# Patient Record
Sex: Female | Born: 1940
Health system: Southern US, Community
[De-identification: ages and names within clinical notes are randomized; demographics above are authoritative.]

## PROBLEM LIST (undated history)

## (undated) DIAGNOSIS — F319 Bipolar disorder, unspecified: Secondary | ICD-10-CM

## (undated) DIAGNOSIS — J189 Pneumonia, unspecified organism: Secondary | ICD-10-CM

## (undated) DIAGNOSIS — I2510 Atherosclerotic heart disease of native coronary artery without angina pectoris: Secondary | ICD-10-CM

## (undated) DIAGNOSIS — F32A Depression, unspecified: Secondary | ICD-10-CM

## (undated) DIAGNOSIS — F419 Anxiety disorder, unspecified: Secondary | ICD-10-CM

## (undated) DIAGNOSIS — F329 Major depressive disorder, single episode, unspecified: Secondary | ICD-10-CM

## (undated) DIAGNOSIS — M1990 Unspecified osteoarthritis, unspecified site: Secondary | ICD-10-CM

## (undated) DIAGNOSIS — K219 Gastro-esophageal reflux disease without esophagitis: Secondary | ICD-10-CM

## (undated) DIAGNOSIS — I10 Essential (primary) hypertension: Secondary | ICD-10-CM

## (undated) DIAGNOSIS — G459 Transient cerebral ischemic attack, unspecified: Secondary | ICD-10-CM

## (undated) DIAGNOSIS — E119 Type 2 diabetes mellitus without complications: Secondary | ICD-10-CM

## (undated) DIAGNOSIS — J449 Chronic obstructive pulmonary disease, unspecified: Secondary | ICD-10-CM

## (undated) DIAGNOSIS — R911 Solitary pulmonary nodule: Secondary | ICD-10-CM

## (undated) HISTORY — DX: Essential (primary) hypertension: I10

## (undated) HISTORY — PX: DILATION AND CURETTAGE OF UTERUS: SHX78

## (undated) HISTORY — DX: Solitary pulmonary nodule: R91.1

## (undated) HISTORY — PX: APPENDECTOMY: SHX54

## (undated) HISTORY — DX: Bipolar disorder, unspecified: F31.9

## (undated) HISTORY — PX: CHOLECYSTECTOMY: SHX55

## (undated) HISTORY — PX: VESICOVAGINAL FISTULA CLOSURE W/ TAH: SUR271

## (undated) HISTORY — DX: Type 2 diabetes mellitus without complications: E11.9

## (undated) HISTORY — DX: Chronic obstructive pulmonary disease, unspecified: J44.9

## (undated) HISTORY — PX: KNEE ARTHROSCOPY: SUR90

## (undated) HISTORY — PX: TIBIA FRACTURE SURGERY: SHX806

## (undated) HISTORY — PX: CATARACT EXTRACTION, BILATERAL: SHX1313

## (undated) HISTORY — DX: Atherosclerotic heart disease of native coronary artery without angina pectoris: I25.10

## (undated) HISTORY — DX: Transient cerebral ischemic attack, unspecified: G45.9

## (undated) HISTORY — PX: ABDOMINAL HYSTERECTOMY: SHX81

## (undated) SURGERY — Surgical Case
Anesthesia: *Unknown

---

## 1998-07-26 HISTORY — PX: TOTAL KNEE ARTHROPLASTY: SHX125

## 1999-03-23 ENCOUNTER — Encounter: Payer: Self-pay | Admitting: Orthopedic Surgery

## 1999-03-25 ENCOUNTER — Inpatient Hospital Stay (HOSPITAL_COMMUNITY): Admission: RE | Admit: 1999-03-25 | Discharge: 1999-03-31 | Payer: Self-pay | Admitting: Orthopedic Surgery

## 1999-03-28 ENCOUNTER — Encounter: Payer: Self-pay | Admitting: Cardiovascular Disease

## 1999-06-09 ENCOUNTER — Ambulatory Visit (HOSPITAL_COMMUNITY): Admission: RE | Admit: 1999-06-09 | Discharge: 1999-06-09 | Payer: Self-pay | Admitting: Orthopedic Surgery

## 2008-01-24 DIAGNOSIS — R911 Solitary pulmonary nodule: Secondary | ICD-10-CM

## 2008-01-24 HISTORY — DX: Solitary pulmonary nodule: R91.1

## 2008-01-28 ENCOUNTER — Encounter: Payer: Self-pay | Admitting: Cardiology

## 2008-01-29 ENCOUNTER — Ambulatory Visit: Payer: Self-pay | Admitting: Cardiology

## 2008-01-30 ENCOUNTER — Encounter: Payer: Self-pay | Admitting: Cardiology

## 2008-02-15 ENCOUNTER — Ambulatory Visit: Payer: Self-pay | Admitting: Cardiology

## 2008-08-06 ENCOUNTER — Encounter: Payer: Self-pay | Admitting: Cardiology

## 2008-08-19 ENCOUNTER — Encounter: Payer: Self-pay | Admitting: Cardiology

## 2008-09-04 ENCOUNTER — Ambulatory Visit: Payer: Self-pay | Admitting: Cardiology

## 2008-09-04 ENCOUNTER — Encounter: Payer: Self-pay | Admitting: Cardiology

## 2008-09-05 ENCOUNTER — Ambulatory Visit: Payer: Self-pay | Admitting: Cardiovascular Disease

## 2008-09-05 ENCOUNTER — Encounter: Payer: Self-pay | Admitting: Cardiology

## 2008-09-05 ENCOUNTER — Inpatient Hospital Stay (HOSPITAL_COMMUNITY): Admission: AD | Admit: 2008-09-05 | Discharge: 2008-09-06 | Payer: Self-pay | Admitting: Cardiovascular Disease

## 2008-09-06 ENCOUNTER — Ambulatory Visit: Payer: Self-pay | Admitting: Surgery

## 2008-09-06 ENCOUNTER — Encounter: Payer: Self-pay | Admitting: Cardiovascular Disease

## 2008-09-17 ENCOUNTER — Encounter: Payer: Self-pay | Admitting: Cardiology

## 2008-09-26 ENCOUNTER — Ambulatory Visit: Payer: Self-pay | Admitting: Cardiology

## 2008-12-09 ENCOUNTER — Encounter: Payer: Self-pay | Admitting: Cardiology

## 2008-12-11 ENCOUNTER — Encounter: Payer: Self-pay | Admitting: Cardiology

## 2008-12-24 ENCOUNTER — Encounter: Payer: Self-pay | Admitting: Cardiology

## 2009-02-21 ENCOUNTER — Encounter: Payer: Self-pay | Admitting: Cardiology

## 2009-02-26 ENCOUNTER — Encounter: Payer: Self-pay | Admitting: Cardiology

## 2009-03-25 ENCOUNTER — Telehealth: Payer: Self-pay | Admitting: Cardiology

## 2009-04-23 ENCOUNTER — Encounter: Payer: Self-pay | Admitting: Cardiology

## 2009-05-23 ENCOUNTER — Ambulatory Visit: Payer: Self-pay | Admitting: Cardiology

## 2009-05-23 DIAGNOSIS — I10 Essential (primary) hypertension: Secondary | ICD-10-CM

## 2009-05-23 DIAGNOSIS — F17200 Nicotine dependence, unspecified, uncomplicated: Secondary | ICD-10-CM

## 2009-05-23 DIAGNOSIS — I2510 Atherosclerotic heart disease of native coronary artery without angina pectoris: Secondary | ICD-10-CM | POA: Insufficient documentation

## 2009-05-23 DIAGNOSIS — E782 Mixed hyperlipidemia: Secondary | ICD-10-CM

## 2009-07-07 ENCOUNTER — Encounter: Payer: Self-pay | Admitting: Cardiology

## 2009-07-22 ENCOUNTER — Encounter (INDEPENDENT_AMBULATORY_CARE_PROVIDER_SITE_OTHER): Payer: Self-pay | Admitting: *Deleted

## 2009-08-27 ENCOUNTER — Encounter: Payer: Self-pay | Admitting: Cardiology

## 2009-09-23 ENCOUNTER — Encounter: Payer: Self-pay | Admitting: Cardiology

## 2009-09-25 ENCOUNTER — Encounter: Payer: Self-pay | Admitting: Cardiology

## 2009-12-09 ENCOUNTER — Encounter (INDEPENDENT_AMBULATORY_CARE_PROVIDER_SITE_OTHER): Payer: Self-pay | Admitting: *Deleted

## 2010-02-03 ENCOUNTER — Encounter: Payer: Self-pay | Admitting: Cardiology

## 2010-03-05 ENCOUNTER — Ambulatory Visit: Payer: Self-pay | Admitting: Cardiology

## 2010-03-16 ENCOUNTER — Encounter: Payer: Self-pay | Admitting: Cardiology

## 2010-06-26 ENCOUNTER — Encounter (INDEPENDENT_AMBULATORY_CARE_PROVIDER_SITE_OTHER): Payer: Self-pay | Admitting: *Deleted

## 2010-06-29 ENCOUNTER — Encounter: Payer: Self-pay | Admitting: Cardiology

## 2010-08-25 NOTE — Letter (Signed)
Summary: [facility]  Letter/ [facility]   Imported By: Dorise Hiss 09/24/2009 09:22:16  _____________________________________________________________________  External Attachment:    Type:   Image     Comment:   External Document

## 2010-08-25 NOTE — Miscellaneous (Signed)
Summary: Orders Update  Clinical Lists Changes  Problems: Added new problem of LONG-TERM (CURRENT) USE OF OTHER MEDICATIONS (ICD-V58.69) Orders: Added new Test order of T-Hepatic Function ([id number]) - Signed Added new Test order of T-Lipid Profile ([id number]) - Signed 

## 2010-08-25 NOTE — Assessment & Plan Note (Signed)
Summary: 6 MO FU REMINDER-SRS   Visit Type:  Follow-up Primary Provider:  Dr. Doreen Beam   History of Present Illness: 70 year old woman presents for followup. She is here with an aide. Uses a wheelchair. Still has difficulty ambulating. No reported angina or unusual shortness of breath over baseline. She continues to smoke cigarettes despite chronic lung disease. We have discussed smoking cessation.  Patient was taken off of Crestor at her last visit. Followup labs from March showed cholesterol 219, HDL 48, LDL 135, cholesterol is 182, ALT 13, AST 13. Low-dose simvastatin was initiated at that time. She seems to be tolerating it. Most recent followup labs from July showed cholesterol 207, cholesterol 134, HDL 47, LDL 133, ALT 14, AST 13. We discussed the fact that we will be limited in how high this medicine can be increased related to some of her other concurrent medications. May be best to switch altogether.  The patient's aide indicates that blood pressures at home have been 150-170 systolic over the 80s. Dr. Sherril Croon added low-dose metoprolol recently.  Preventive Screening-Counseling & Management  Alcohol-Tobacco     Smoking Status: current     Smoking Cessation Counseling: yes     Packs/Day: 1/2 PPD  Current Medications (verified): 1)  Isosorbide Mononitrate Cr 30 Mg Xr24h-Tab (Isosorbide Mononitrate) .... Take 1 Tablet By Mouth Once A Day 2)  Advair Diskus 250-50 Mcg/dose Aepb (Fluticasone-Salmeterol) .... One Inhalation Twice Daily 3)  Metformin Hcl 500 Mg Tabs (Metformin Hcl) .... Take 2 Tablet By Mouth Two Times A Day 4)  Diltiazem Hcl Cr 120 Mg Xr12h-Cap (Diltiazem Hcl) .... Take 1 Tablet By Mouth Once A Day 5)  Duoneb 0.5-2.5 (3) Mg/63ml Soln (Ipratropium-Albuterol) .... One Neb Four Times A Day 6)  Lamictal 200 Mg Tabs (Lamotrigine) .... Take 1 Tablet By Mouth Once A Day 7)  Aspirin 81 Mg Tbec (Aspirin) .... Take 1 Tablet By Mouth Once A Day 8)  Spiriva Handihaler 18 Mcg Caps  (Tiotropium Bromide Monohydrate) .... One Inhalation Daily 9)  Alprazolam 0.25 Mg Tabs (Alprazolam) .... Take 1 Tablet By Mouth Three Times A Day 10)  Caltrate 600 1500 Mg Tabs (Calcium Carbonate) .... Take 1 Tablet By Mouth Once A Day 11)  Simvastatin 10 Mg Tabs (Simvastatin) .... Take One Tablet By Mouth Daily At Bedtime 12)  Seroquel 25 Mg Tabs (Quetiapine Fumarate) .... Take 1 Tablet By Mouth Once A Day 13)  Vesicare 10 Mg Tabs (Solifenacin Succinate) .... Take 1 Tablet By Mouth Once A Day 14)  Donepezil Hcl 10 Mg Tabs (Donepezil Hcl) .... Take 1 Tablet By Mouth Once A Day 15)  Sertraline Hcl 100 Mg Tabs (Sertraline Hcl) .... Take 1 Tablet By Mouth Once A Day 16)  Metoprolol Succinate 25 Mg Xr24h-Tab (Metoprolol Succinate) .... Take 1/2 Tablet By Mouth Once A Day  Allergies (verified): 1)  ! Pcn 2)  ! Tetracycline 3)  ! Zithromax  Comments:  Nurse/Medical Assistant: The patient's medication list and allergies were reviewed with the patient and were updated in the Medication and Allergy Lists.  Past History:  Past Medical History: Last updated: 05/23/2009 COPD Bipolar disorder History of TIAs Dysphagia History of nonspecific, 5-mm right middle lobe nodule, July 2009 CAD - nonobstructive Diabetes Type 2 Hypertension  Social History: Last updated: 09/05/2008 Tobacco Use - Yes.   Review of Systems       The patient complains of dyspnea on exertion.  The patient denies anorexia, fever, chest pain, syncope, peripheral edema, melena, and hematochezia.  Otherwise reviewed and negative except as outlined.  Vital Signs:  Patient profile:   70 year old female Height:      65 inches Weight:      157 pounds BMI:     26.22 Pulse rate:   60 / minute BP sitting:   178 / 78  (left arm) Cuff size:   regular  Vitals Entered By: Carlye Grippe (March 05, 2010 10:59 AM)  Nutrition Counseling: Patient's BMI is greater than 25 and therefore counseled on weight management  options.  Physical Exam  Additional Exam:  Chronically ill-appearing woman in no acute distress. HEENT: Conjunctiva and lids normal, oropharynx with poor dentition. Neck: Supple, no carotid bruits or thyromegaly. Lungs: Diminished breath sounds, no active wheezing, nonlabored. Cardiac: Distant regular heart sounds, no S3 gallop. Abdomen: Soft, nontender. Extremities: No frank pitting edema, mild venous stasis, 1+ pulses. Skin: Warm and dry. Musculoskeletal: No kyphosis. Neuropsychiatric: Alert and oriented x3, affect flat.   EKG  Procedure date:  03/05/2010  Findings:      Sinus rhythm at 61 beats per minute with left ventricular hypertrophy.  Impression & Recommendations:  Problem # 1:  ESSENTIAL HYPERTENSION, BENIGN (ICD-401.1)  Blood pressure not well controlled. Dr. Sherril Croon has been making some medication adjustments recently. One approach to consider might be to discontinue beta blocker therapy in light of her chronic lung disease, continue diltiazem, and to this add an ACE inhibitor presuming renal function is normal. This would be a good choice with associated diabetes mellitus and left ventricular hypertrophy.  The following medications were removed from the medication list:    Lasix 40 Mg Tabs (Furosemide) .Marland Kitchen... Take 1 tablet by mouth once a day as needed Her updated medication list for this problem includes:    Diltiazem Hcl Cr 120 Mg Xr12h-cap (Diltiazem hcl) .Marland Kitchen... Take 1 tablet by mouth once a day    Aspirin 81 Mg Tbec (Aspirin) .Marland Kitchen... Take 1 tablet by mouth once a day    Metoprolol Succinate 25 Mg Xr24h-tab (Metoprolol succinate) .Marland Kitchen... Take 1/2 tablet by mouth once a day  Problem # 2:  TOBACCO ABUSE (ICD-305.1)  Smoking cessation was discussed.  Problem # 3:  CORONARY ATHEROSCLEROSIS NATIVE CORONARY ARTERY (ICD-414.01)  Nonobstructive, no active angina. Risk factor modification strategies recommended.  Her updated medication list for this problem includes:     Isosorbide Mononitrate Cr 30 Mg Xr24h-tab (Isosorbide mononitrate) .Marland Kitchen... Take 1 tablet by mouth once a day    Diltiazem Hcl Cr 120 Mg Xr12h-cap (Diltiazem hcl) .Marland Kitchen... Take 1 tablet by mouth once a day    Aspirin 81 Mg Tbec (Aspirin) .Marland Kitchen... Take 1 tablet by mouth once a day    Metoprolol Succinate 25 Mg Xr24h-tab (Metoprolol succinate) .Marland Kitchen... Take 1/2 tablet by mouth once a day  Orders: EKG w/ Interpretation (93000)  Problem # 4:  HYPERLIPIDEMIA, MIXED (ICD-272.2)  At this point will plan to change from simvastatin to Lipitor beginning at 20 mg p.o. q.h.s. More likely to be able to achieve LDL control. Obtain fasting lipid profile and liver function tests after approximately 12 weeks.  Her updated medication list for this problem includes:    Lipitor 20 Mg Tabs (Atorvastatin calcium) .Marland Kitchen... Take one tablet by mouth daily.  Patient Instructions: 1)  Your physician wants you to follow-up in: 6 months. You will receive a reminder letter in the mail one-two months in advance. If you don't receive a letter, please call our office to schedule the follow-up appointment. 2)  [employer]  current supply of Zocor (simvastatin), then start Lipitor 20mg  by mouth at bedtime. 3)  Your physician recommends that you go to the [facility] for a FASTING lipid profile and liver function labs:  3 MONTHS AFTER STARTING LIPITOR. 4)  Your physician discussed the hazards of tobacco use.  Tobacco use cessation is recommended and techniques and options to help you quit were discussed. Prescriptions: LIPITOR 20 MG TABS (ATORVASTATIN CALCIUM) Take one tablet by mouth daily.  #30 x 6   Entered by:   Cyril Loosen, RN, BSN   Authorized by:   Loreli Slot, MD, [facility]   Signed by:   Cyril Loosen, RN, BSN on 03/05/2010   Method used:   Electronically to        [facility]* (retail)       [address]       [place], [place]  [address]       Ph: [phone]       Fax: [phone]    RxID:   [id number]

## 2010-08-25 NOTE — Medication Information (Signed)
Summary: Lipitor Auth Denial  Lipitor Auth Denial   Imported By: Cyril Loosen, RN, BSN 03/17/2010 08:42:26  _____________________________________________________________________  External Attachment:    Type:   Image     Comment:   External Document  Appended Document: Lipitor Auth Denial Reviewed.  The patient has already tried Crestor and had to stop it related to leg pain.  This is documented in prior notes.  Simvastatin was stopped due to being inadequetly effective at low dose (which she is limited to based on medication profile and FDA recommendations).  Vytorin has simvastatin in it, and is therefore restricted by the same FDA recommendations, and therefore not an appropriate choice either.  This brings [place] back to Lipitor, which I would still suggest is a reasonable option in this case.  Appended Document: Lipitor Auth Denial called [employer] and gave new info r/e failed meds which inclueded crestor 40mg  that patient tried and was unable to use due to leg pain. Pharmacist approved lipitor 20mg  till 07/25/2010. Called and informed pharmacy.

## 2010-08-25 NOTE — Letter (Signed)
Summary: Generic Engineer, agricultural at [facility] [address]   [place], [place] [address]   Phone: [phone]  Fax: [phone]        Dec 09, 2009 MRN: [id number]    [facility] [address] [place], [place]  [address]    Dear Ms. ZEIMET,   It is now time for you to have lab work to check your cholesterol and liver function following your medication change in March. Please, take the enclosed order to the [facility] to have this lab work done as soon as possible. Do not eat or drink after midnight.   You were also due for a 6 month follow up with Dr. Diona Browner in April. Please, contact our office as soon as possible to schedule this appointment.      Sincerely,  Cyril Loosen, RN, BSN  This letter has been electronically signed by your physician.  Appended Document: Generic Letter Pt's daughter called and scheduled appt with Dr. Diona Browner on 7/22. She will have pt do labs before this appt date. Updated order faxed to the [facility].

## 2010-11-10 LAB — LIPID PANEL
Cholesterol: 164 mg/dL (ref 0–200)
HDL: 49 mg/dL (ref >39)
LDL Cholesterol: 98 mg/dL (ref 0–99)
Total CHOL/HDL Ratio: 3.3 RATIO
Triglycerides: 86 mg/dL (ref <150)

## 2010-11-10 LAB — BASIC METABOLIC PANEL
BUN: 25 mg/dL — ABNORMAL HIGH (ref 6–23)
Calcium: 9.2 mg/dL (ref 8.4–10.5)
Chloride: 102 mEq/L (ref 96–112)
Creatinine, Ser: 0.93 mg/dL (ref 0.4–1.2)
GFR calc Af Amer: >60 mL/min (ref >60)
GFR calc non Af Amer: 60 mL/min — ABNORMAL LOW (ref >60)

## 2010-11-10 LAB — CBC
Platelets: 208 10*3/uL (ref 150–400)
RBC: 4.01 MIL/uL (ref 3.87–5.11)
WBC: 19.3 10*3/uL — ABNORMAL HIGH (ref 4.0–10.5)

## 2010-11-10 LAB — GLUCOSE, CAPILLARY
Glucose-Capillary: 183 mg/dL — ABNORMAL HIGH (ref 70–99)
Glucose-Capillary: 245 mg/dL — ABNORMAL HIGH (ref 70–99)
Glucose-Capillary: 260 mg/dL — ABNORMAL HIGH (ref 70–99)

## 2010-11-24 HISTORY — PX: CARDIAC CATHETERIZATION: SHX172

## 2010-12-08 NOTE — Assessment & Plan Note (Signed)
[facility]                          [facility] NOTE   NAME:Brenda Mcdonald, Brenda Mcdonald                       MRN:          [id number]  DATE:09/26/2008                            DOB:          1941/04/28    PRIMARY CARE PHYSICIAN:  Doreen Beam, MD   PULMONOLOGIST:  Cherie Ouch, MD   REASON FOR VISIT:  Post-hospital followup.   HISTORY OF PRESENT ILLNESS:  I saw Ms. Cleary in the office back in July  2009.  She has been managed medically for possible underlying coronary  atherosclerosis in the setting of chronic obstructive pulmonary disease  and previously documented left ventricular ejection fraction of 55-60%  with mild-to-moderate ventricular chamber dilatation.  More recently in  February she was seen by Dr. Andee Lineman in consultation at [facility] after presenting with chest pain.  She ruled out for myocardial  infarction, although in light of her risk factor profile and presumed  diagnosis of coronary artery disease was referred for a diagnostic  cardiac catheterization at [facility] for definitive diagnosis.  She underwent a coronary angiogram which revealed nonobstructive  atherosclerosis and underwent some medication adjustments for further  risk factor modification.  She did undergo carotid duplex which showed  moderate calcific plaque in the common carotid with no internal carotid  artery stenosis.  She presents now to the office with her daughter for  followup.  She has had no groin complications status post angiography.  She has apparently had some problems with lower extremity edema and  weight gain and was placed on p.r.n. Lasix and potassium  supplementation, although this is improved per her report.  She is  tolerating her medical regimen which is outlined below and is due to see  Dr. Orson Aloe as well as Dr. Sherril Croon in followup.   ALLERGIES:  PENICILLIN and TETRACYCLINE.   PRESENT MEDICATIONS:  1. Advair  250/50 one puff daily.  2. Metformin 500 mg two tablets p.o. b.i.d.  3. Diltiazem CD 120 mg p.o. daily.  4. DuoNeb q.i.d.  5. Imdur 30 mg p.o. nightly.  6. Zyprexa 5 mg p.o. nightly.  7. Xanax 0.5 mg one-half tablet p.o. q.a.m. 1 mid day, and 2 tablets      p.o. nightly.  8. Glipizide XL 5 mg p.o. q.a.m.  9. Crestor 40 mg p.o. daily.  10.Lamictal 25 mg p.o. nightly.  11.Lasix 40 mg p.o. daily p.r.n. with potassium supplementation.  12.Aspirin 81 mg p.o. daily.  13.Spiriva one capsule daily.   REVIEW OF SYSTEMS:  As outlined above, appears negative.   PHYSICAL EXAMINATION:  VITAL SIGNS:  Blood pressure is 121/73, heart  rate 90, and weight 196 pounds.  GENERAL:  The patient is in no acute distress, seated in a wheelchair.  LUNGS:  Diminished breath sounds, but no wheezing or labored breathing.  CARDIAC:  Regular rate and rhythm.  No pericardial rub, S3, or gallop.  Soft S4.  PMI indistinct.  ABDOMEN:  Soft and nontender.  Positive bowel sounds.  EXTREMITIES:  Exhibit 1+ edema and venous stasis.  Distal pulses 1+.  SKIN:  Warm and dry.  MUSCULOSKELETAL:  No kyphosis noted.   IMPRESSION AND RECOMMENDATIONS:  Nonobstructive coronary atherosclerosis  documented recently at cardiac catheterization in the setting of chest  pain and cardiac risk factors.  At this point, would recommend  aggressive risk factor modification with management of blood pressure,  lipids, and diabetes mellitus.  She is now on high-dose Crestor.  We  will plan a followup lipid profile and liver function tests down the  road.  Otherwise, her medical regimen includes aspirin, a long-acting  nitrate, and calcium channel blocker therapy rather than beta-blocker  therapy in light of her lung disease.  She is not reporting any recent  problems with chest pain and is due to see Dr. Orson Aloe back for  routine followup.  I will plan to see her back over the next 6 months.     Jonelle Sidle, MD  Electronically  Signed    SGM/MedQ  DD: 09/26/2008  DT: 09/27/2008  Job #: [id number]   cc:   Doreen Beam, MD  Cherie Ouch, MD

## 2010-12-08 NOTE — Discharge Summary (Signed)
Brenda Mcdonald, Brenda Mcdonald                ACCOUNT NO.:  [id number]   MEDICAL RECORD NO.:  [id number]          PATIENT TYPE:  INP   LOCATION:  4729                         FACILITY:  [facility]   PHYSICIAN:  Verne Carrow, MDDATE OF BIRTH:  March 12, 1941   DATE OF ADMISSION:  09/05/2008  DATE OF DISCHARGE:  09/06/2008                               DISCHARGE SUMMARY   ADDENDUM   The patient will follow up with Dr. Diona Browner on September 26, 2008, at 1:45  p.m. per conversation with Lynden Ang at the [place] office, this is just what  makes the most sense, based on Dr. Margarita Mail schedule and previous visits  that this patient has had with Dr. Diona Browner recently.      Jarrett Ables, [facility]      Verne Carrow, MD  Electronically Signed    MS/MEDQ  D:  09/06/2008  T:  09/07/2008  Job:  [id number]

## 2010-12-08 NOTE — Discharge Summary (Signed)
NAMECORRIN, SIELING                ACCOUNT NO.:  [id number]   MEDICAL RECORD NO.:  [id number]          PATIENT TYPE:  INP   LOCATION:  4729                         FACILITY:  [facility]   PHYSICIAN:  Verne Carrow, MDDATE OF BIRTH:  November 22, 1940   DATE OF ADMISSION:  09/05/2008  DATE OF DISCHARGE:  09/06/2008                               DISCHARGE SUMMARY   PRIMARY CARDIOLOGIST:  Learta Codding, MD, [facility]   PRIMARY MEDICAL DOCTOR:  Doreen Beam, MD   DISCHARGE DIAGNOSIS:  Noncardiac chest pain.   SECONDARY DIAGNOSES:  1. Nonobstructive coronary artery disease.  2. Diabetes mellitus.  3. Peripheral vascular disease.  4. Chronic obstructive pulmonary disease.  5. Bipolar disorder.  6. Hypertension.  7. Stable calcified right lower lobe granuloma.  8. History of transient ischemic attacks.   ALLERGIES:  1. PENICILLIN.  2. AZITHROMYCIN.   PROCEDURES PERFORMED DURING THIS HOSPITALIZATION:  Cardiac  catheterization which showed:  1. Mild nonobstructive coronary artery disease.  2. Normal left ventricular systolic function.  3. No significant abdominal aortic disease.  4. No significant disease in the common iliac arteries, external iliac      arteries, or bilateral femoral arteries.  5. Normal bilateral renal arteries.   HISTORY OF PRESENT ILLNESS:  Brenda Mcdonald is a 70 year old female with a  history of hypertension, type 2 diabetes, transient ischemic attacks,  and nonobstructive cerebrovascular disease by carotid Dopplers on  November 2007 as well as COPD and bipolar disorder, who presented to the  ER at [facility] on September 04, 2008, with complaints of severe  chest pain and associated shortness of breath.  She was found to have  elevated BP of 196/99 and pulse of 104.  She had an EKG at that time,  history of sinus tachycardia with no acute changes.  Serial cardiac  markers at the time of the Cardiology consult by Dr. Diona Browner were all  within normal limits.  The  patient describes the pain beginning on  September 04, 2008, in the afternoon suddenly and severely, and was  witnessed by her husband who reports the patient looking extremely pale  and anticipated her passing out based on her parents.  The patient did  not experience syncope; however, she was short of breath and had intense  chest pain described as a pressure.  The patient was treated with  aspirin and nitroglycerin as well as nebulizer treatment, steroids, and  IV Levaquin.  The patient had chest x-ray, which was consistent with  COPD, but no definitive evidence of pulmonary edema or pleural  effusions.  There was an unchanged calcified granuloma at the right lung  base.  The patient only exerts herself minimally during house chores and  walking around her home with some difficulty, however, per her and her  husband's report she seems to have dyspnea with this minimal exertion.  Per 2-D echocardiogram on July 2009, LVEF approximately 50-55% with  moderate mitral regurgitation.  After reviewing all the informations,  Dr. Diona Browner felt her symptoms compelling enough to transfer to [facility] for diagnostic left cardiac catheterization.  HOSPITAL COURSE:  The patient was admitted to [facility] on  September 05, 2008, and underwent a cardiac catheterization as described  above without significant complications.  The patient tolerated the  procedure well and was deemed in stable enough condition for discharge  on September 06, 2008.  The patient will follow up with her primary  cardiologist within 2-3 weeks to be scheduled prior to the patient's  discharge.  The patient's symptoms were significantly improved on the  day of discharge without any chest pain and minimal shortness of breath.  Her vital signs remained stable during her hospital course.  Most recent  vital signs on day of discharge were temperature 97.1 degrees  Fahrenheit, BP 121/67, pulse 90, respirations 17,  and O2 saturation 97%  on 2 L.  Her weight was 95.663 kg.  The patient was given her followup  instructions and medications list in both oral and written form, and at  the time of discharge, she had no questions or concerns that had not  been addressed.   DISCHARGE LABORATORY DATA:  WBC 19.3, HGB 11.6, HCT 34.5, and PLT count  208.  Sodium 139, potassium 3.7, chloride 102, CO2 28, BUN 25,  creatinine 0.93, glucose was 229, calcium 9.2, and total cholesterol was  164.  Triglycerides were 86, HDL 49, LDL 98, and VLDL 17.   FOLLOWUP PLANS AND APPOINTMENTS:  The patient will be scheduled to see  Dr. Andee Lineman in the next 2-3 weeks.  The patient has been instructed to  schedule a followup appointment with her primary care doctor within 1  week of today if possible or as soon as possible.   DURATION OF DISCHARGE/ENCOUNTER INCLUDING PHYSICIAN TIME:  45 minutes.      Jarrett Ables, [facility]      Verne Carrow, MD  Electronically Signed    MS/MEDQ  D:  09/06/2008  T:  09/07/2008  Job:  [id number]   cc:   Jonelle Sidle, MD  Learta Codding, MD,FACC  Doreen Beam, MD

## 2010-12-08 NOTE — Assessment & Plan Note (Signed)
[facility]                          [facility] OFFICE NOTE   NAME:Brenda Mcdonald, Brenda Mcdonald                       MRN:          [id number]  DATE:02/15/2008                            DOB:          [birth year]    PRIMARY CARE PHYSICIAN:  Doreen Beam, MD   REASON FOR VISIT:  Establish cardiac followup.   HISTORY OF PRESENT ILLNESS:  Brenda Mcdonald was seen as an inpatient consults  back in early July 2009.  She was admitted at that time with evidence of  COPD exacerbation and also diastolic heart failure.  Echocardiography  revealed mildly to moderately dilated left ventricle with an ejection  fraction of 55-60% and pseudo-normal diastolic filling pattern.  There  was moderate mitral regurgitation and also mild tricuspid regurgitation.  She was managed medically.  We did discuss the possibility of proceeding  with a noninvasive ischemic workup, although Brenda Mcdonald prefers  conservative management at this point.  She does have general chest  tightness although this may well be related to her lung status.  It is  not purely exertional.  She states that she has home health nursing 3  times a week and using a rolling walker.  She also uses regular  nebulizer treatments and metered dose inhalers.  She is due to see Dr.  Orson Aloe tomorrow.  We talked about the likelihood that she does have  some degree of underlying coronary artery disease, and she seems fairly  realistic about this.  She prefers medical therapy, and we discussed  adding a long-acting nitrate to see if this helps symptomatically.   ALLERGIES:  1. PENICILLIN.  2. TETRACYCLINE.   PRESENT MEDICATIONS:  1. Advair 250/50 one puff daily.  2. Aspirin 325 mg p.o. daily.  3. Glimepiride 4 mg p.o. daily.  4. Metformin 500 mg 2 tablets p.o. b.i.d.  5. Diltiazem CD 120 mg p.o. daily.  6. Lithium 300 mg 2 tablets p.o. q.a.m.  7. DuoNeb q.i.d.  8. Xanax 0.5 mg p.r.n.  9. Albuterol inhaler p.r.n.   REVIEW OF  SYSTEMS:  As described in history of present illness.  Otherwise negative.   PHYSICAL EXAMINATION:  VITAL SIGNS:  Blood pressure today is 152/79,  heart rate is 85, and weight is 189 pounds.  GENERAL:  He is overweight, in no acute distress.  HEENT:  Conjunctivae are normal.  Oropharynx is clear.  NECK:  Supple.  No elevated jugular venous pressure.  No audible bruits.  LUNGS:  Diminished breath sounds throughout.  No active wheezing or  labored breathing.  CARDIAC:  Regular rate and rhythm.  Soft systolic murmur towards the  apex.  No S3 gallop or pericardial rub.  ABDOMEN:  Soft, nontender, and normoactive bowel sounds.  EXTREMITIES:  Trace edema and chronic stasis.  Distal pulses 1-2+.  SKIN:  Warm and dry.  MUSCULOSKELETAL:  No kyphosis noted.  NEUROPSYCHIATRIC:  The patient is alert and oriented x3.  Affect is  appropriate.   Other recent studies include carotid Dopplers in 2007 demonstrating mild  internal carotid artery plaque.  Labs done just recently indicate  actually  very favorable lipid numbers with a total cholesterol of 133,  triglycerides 78, HDL 54, and LDL 63.   IMPRESSION AND RECOMMENDATIONS:  1. Presumed history of underlying coronary atherosclerosis.  This is      being managed conservatively with medical therapy at this time      after discussion with the patient.  She will continue on aspirin      and diltiazem CD 120 mg daily.  I will begin a trial of Imdur 30 mg      daily as well.  Lipid status actually looks quite favorable.  She      plans to continue with her home physical therapy and home health      nursing.  We will see her back over the next 3 month.  2. Chronic obstructive pulmonary disease, to see Dr. Orson Aloe      tomorrow.  I think this also clearly impacts her symptoms.     Jonelle Sidle, MD  Electronically Signed    SGM/MedQ  DD: 02/15/2008  DT: 02/16/2008  Job #: [id number]   cc:   Doreen Beam, MD

## 2010-12-08 NOTE — Cardiovascular Report (Signed)
NAMEJULLIAN, Brenda Mcdonald                ACCOUNT NO.:  [id number]   MEDICAL RECORD NO.:  [id number]          PATIENT TYPE:  INP   LOCATION:  4729                         FACILITY:  [facility]   PHYSICIAN:  Verne Carrow, MDDATE OF BIRTH:  [birth year]   DATE OF PROCEDURE:  09/05/2008  DATE OF DISCHARGE:                            CARDIAC CATHETERIZATION   PRIMARY CARDIOLOGIST:  Learta Codding, MD, [facility]   PRIMARY CARE PHYSICIAN:  Doreen Beam, MD   PROCEDURES PERFORMED:  1. Left heart catheterization  2. Selective coronary angiography.  3. Left ventricular angiogram.  4. Abdominal aortogram with bifemoral runoff.   OPERATOR:  Verne Carrow, MD   INDICATIONS:  This is a 70 year old Caucasian female with a history of  diabetes mellitus, hypertension, longstanding tobacco abuse, COPD, and a  strong family history of coronary artery disease who presented to  [facility] yesterday with complaints of substernal chest pain.  She ruled out for a myocardial infarction and had no ischemic EKG  changes.  Her story was worrisome for obstructive coronary artery  disease.  She was transferred to [facility] today for a  diagnostic left heart catheterization.   DESCRIPTION OF PROCEDURE:  The patient was brought to the Inpatient  Cardiac Catheterization Laboratory after signing informed consent for  the procedure.  The right groin was prepped and draped in sterile  fashion.  Lidocaine 1% was used for local anesthesia.  A 5-French sheath  was placed in the right femoral artery without difficulty.  Standard  diagnostic catheters were used to perform selective coronary  angiography.  A 5-French pigtail catheter was used to cross the aortic  valve into the left ventricle.  Following the performance of the left  ventricular angiogram, the catheter was pulled back across the aortic  valve with no significant pressure gradient measured.  At this point in  case, we performed an abdominal  aortogram from the suprarenal location.  We also performed another aortogram just at the level of the bifurcation  into the common iliacs.  A selective right femoral angiogram was  obtained through the arterial sheath.  The patient tolerated the  procedure well and was taken to the holding area in stable condition.   HEMODYNAMIC FINDINGS:  Left ventricular pressure 145/6, end-diastolic  pressure 9, central aortic pressure 158/87.   ANGIOGRAPHIC FINDINGS:  1. Left ventricular angiogram was performed in the RAO projection.      This showed normal left ventricular systolic function with an      ejection fraction of 55-60%.  No wall motion abnormalities were      noted.  There was no significant mitral regurgitation noted.  2. Left main coronary artery had no significant obstructive disease.  3. Left anterior descending is a large vessel that courses to the apex      and gives off several diagonal branches.  First diagonal branch is      without any disease.  The second diagonal branch is a large vessel      that has luminal irregularities.  The proximal portion of the LAD  has a 30% stenosis and the midportion of the LAD has a 30%      stenosis.  There was no obstructive disease noted in the left      anterior descending coronary artery.  4. The circumflex artery gives off a large first obtuse marginal      branch that bifurcates.  There is a 20% stenosis noted in the      proximal portion of the obtuse marginal branch.  There was no      obstructive disease in the circumflex system.  5. Right coronary artery is a large dominant vessel that gives off a      large posterior descending artery and a large posterolateral      segment.  There are luminal irregularities noted in the proximal      mid and distal vessel as well as the posterior descending artery.      There is one focal area in the distal vessel with a 30-40%      stenosis.  There is no obstructive disease in the right  coronary      artery.  6. Abdominal aortogram performed and shows no significant obstructive      disease from the suprarenal aorta down through the bilateral common      iliac arteries and bilateral femoral arteries.  The bilateral renal      arteries are patent without any significant obstruction.   IMPRESSION:  1. Mild nonobstructive coronary artery disease.  2. Normal left ventricular systolic function.  3. No significant abdominal aortic disease.  4. No significant disease in the common iliac arteries, external iliac      arteries, or bilateral femoral arteries.  5. Normal bilateral renal arteries.   RECOMMENDATIONS:  I think at this point, we should undertake aggressive  risk factor modification in this patient with mild nonobstructive  coronary artery disease.  This will include complete smoking cessation  as well as adequate blood pressure control and tight control of her  diabetes mellitus.  We will obtain carotid Doppler studies tomorrow  prior to discharge.  We will continue all of her other medications as  written.      Verne Carrow, MD  Electronically Signed     CM/MEDQ  D:  09/05/2008  T:  09/06/2008  Job:  [id number]   cc:   Learta Codding, MD,FACC  Doreen Beam, MD

## 2011-01-19 DIAGNOSIS — I498 Other specified cardiac arrhythmias: Secondary | ICD-10-CM

## 2011-01-19 DIAGNOSIS — R55 Syncope and collapse: Secondary | ICD-10-CM

## 2011-01-21 ENCOUNTER — Telehealth: Payer: Self-pay | Admitting: *Deleted

## 2011-01-21 DIAGNOSIS — R55 Syncope and collapse: Secondary | ICD-10-CM

## 2011-01-21 NOTE — Telephone Encounter (Signed)
Attempted to reach pt to schedule appt for 48 hr holter. Female at residence states pt is resting and her husband is unavailable. She will have one of them return call.

## 2011-01-28 NOTE — Telephone Encounter (Signed)
Spoke with pt's caregiver. Holter scheduled for 7/9 b/c pt has an appt with primary MD that day. Pt's appt w/Dr. Diona Browner for 7/9 r/s d/t not having worn hotler yet.

## 2011-02-01 ENCOUNTER — Encounter (INDEPENDENT_AMBULATORY_CARE_PROVIDER_SITE_OTHER): Payer: Medicare Other | Admitting: *Deleted

## 2011-02-01 ENCOUNTER — Encounter: Payer: Medicare Other | Admitting: Cardiology

## 2011-02-01 DIAGNOSIS — R001 Bradycardia, unspecified: Secondary | ICD-10-CM

## 2011-02-01 DIAGNOSIS — R002 Palpitations: Secondary | ICD-10-CM

## 2011-02-01 DIAGNOSIS — R0989 Other specified symptoms and signs involving the circulatory and respiratory systems: Secondary | ICD-10-CM

## 2011-02-01 DIAGNOSIS — R55 Syncope and collapse: Secondary | ICD-10-CM

## 2011-02-04 ENCOUNTER — Encounter: Payer: Self-pay | Admitting: Cardiology

## 2011-02-12 ENCOUNTER — Encounter: Payer: Medicare Other | Admitting: Cardiology

## 2011-02-12 ENCOUNTER — Ambulatory Visit (INDEPENDENT_AMBULATORY_CARE_PROVIDER_SITE_OTHER): Payer: Medicare Other | Admitting: Cardiology

## 2011-02-12 ENCOUNTER — Encounter: Payer: Self-pay | Admitting: Cardiology

## 2011-02-12 VITALS — BP 136/65 | HR 54 | Ht 65.0 in | Wt 163.0 lb

## 2011-02-12 DIAGNOSIS — I2510 Atherosclerotic heart disease of native coronary artery without angina pectoris: Secondary | ICD-10-CM

## 2011-02-12 DIAGNOSIS — I359 Nonrheumatic aortic valve disorder, unspecified: Secondary | ICD-10-CM | POA: Insufficient documentation

## 2011-02-12 DIAGNOSIS — I498 Other specified cardiac arrhythmias: Secondary | ICD-10-CM

## 2011-02-12 DIAGNOSIS — I10 Essential (primary) hypertension: Secondary | ICD-10-CM

## 2011-02-12 DIAGNOSIS — I059 Rheumatic mitral valve disease, unspecified: Secondary | ICD-10-CM | POA: Insufficient documentation

## 2011-02-12 DIAGNOSIS — R001 Bradycardia, unspecified: Secondary | ICD-10-CM | POA: Insufficient documentation

## 2011-02-12 DIAGNOSIS — R55 Syncope and collapse: Secondary | ICD-10-CM | POA: Insufficient documentation

## 2011-02-12 NOTE — Assessment & Plan Note (Signed)
Not recurrent. Etiology not entirely clear, however could have been related to symptomatic bradycardia or a pause, never subsequently documented by monitoring.

## 2011-02-12 NOTE — Assessment & Plan Note (Signed)
This is generally improved. Patient is now completely off of Cardizem, and tolerating low-dose Toprol-XL at 25 mg daily. Will try to continue low-dose beta blocker therapy in light of her concurrent CAD as well as ventricular and atrial ectopy with symptomatic palpitations. Of note, she is also on Aricept, which can lead to bradycardia and some instances. If this is not felt to be leading to benefit and absolutely required, may want to consider discontinuation of this drug.

## 2011-02-12 NOTE — Progress Notes (Signed)
Clinical Summary Brenda Mcdonald is a 70 y.o.female presenting for followup. I last saw her in August of last year. More recently she was seen in consultation at [facility] by Dr. Myrtis Ser. She was seen with significant sinus bradycardia and orthostatic hypotension noted following a reported syncopal event. This led to discontinuation of both beta blocker and calcium channel blocker therapy. Followup testing was arranged.  Recent lab work from June showed BUN 34, creatinine 1.6, potassium 4.4, normal troponin levels. Echocardiogram is noted below. I also reviewed her Holter monitor, which showed no significant pauses or marked bradycardia, but there was atrial and ventricular ectopy observed. No sustained arrhythmias were noted.  She is here with a family member today. She is in a wheelchair, functionally limited, and unable to walk. She denies any chest pain. She does describe palpitations, however states that these have not worsened. She has had no syncope.  I reviewed the results of her testing, discussed her valvular heart disease which will need to be followed, and also discussed her medications. I do not have complete data, however my understanding is that Dr. Sherril Croon placed her back on low-dose metoprolol and also added lisinopril 20 mg daily related to elevated blood pressure readings. She has been able to tolerate the present regimen.  I also note that she has been on Aricept, which can lead to bradycardia in some instances. She follows with a psychiatrist in [place], Brenda Mcdonald.   Allergies  Allergen Reactions  . Azithromycin   . Penicillins   . Tetracycline     Current outpatient prescriptions:ALPRAZolam (XANAX) 0.25 MG tablet, Take 0.25 mg by mouth 3 (three) times daily as needed.  , Disp: , Rfl: ;  aspirin 81 MG tablet, Take 81 mg by mouth daily.  , Disp: , Rfl: ;  atorvastatin (LIPITOR) 10 MG tablet, Take 10 mg by mouth daily.  , Disp: , Rfl: ;  Calcium Carbonate (CALTRATE 600) 1500 MG TABS, Take 1  tablet by mouth daily.  , Disp: , Rfl:  donepezil (ARICEPT) 10 MG tablet, Take 10 mg by mouth at bedtime as needed.  , Disp: , Rfl: ;  Fluticasone-Salmeterol (ADVAIR DISKUS) 250-50 MCG/DOSE AEPB, Inhale 1 puff into the lungs every 12 (twelve) hours. , Disp: , Rfl: ;  ipratropium-albuterol (DUONEB) 0.5-2.5 (3) MG/3ML SOLN, Take 3 mLs by nebulization 4 (four) times daily.  , Disp: , Rfl: ;  isosorbide mononitrate (IMDUR) 30 MG 24 hr tablet, Take 30 mg by mouth daily.  , Disp: , Rfl:  lamoTRIgine (LAMICTAL) 200 MG tablet, Take 200 mg by mouth daily.  , Disp: , Rfl: ;  lisinopril (PRINIVIL,ZESTRIL) 20 MG tablet, Take 20 mg by mouth daily.  , Disp: , Rfl: ;  metFORMIN (GLUCOPHAGE) 500 MG tablet, Take 1,000 mg by mouth 2 (two) times daily with a meal.  , Disp: , Rfl: ;  metoprolol succinate (TOPROL-XL) 25 MG 24 hr tablet, Take 50 mg by mouth 2 (two) times daily. Take 1 tablet in am and 1/2 tablet in pm, Disp: , Rfl:  QUEtiapine (SEROQUEL) 25 MG tablet, Take 25 mg by mouth at bedtime.  , Disp: , Rfl: ;  sertraline (ZOLOFT) 100 MG tablet, Take 100 mg by mouth daily.  , Disp: , Rfl: ;  solifenacin (VESICARE) 10 MG tablet, Take 10 mg by mouth daily.  , Disp: , Rfl: ;  tiotropium (SPIRIVA) 18 MCG inhalation capsule, Place 18 mcg into inhaler and inhale daily.  , Disp: , Rfl:   Past Medical  History  Diagnosis Date  . COPD (chronic obstructive pulmonary disease)   . Bipolar disorder   . TIA (transient ischemic attack)   . Dysphagia   . Essential hypertension, benign   . Diabetes mellitus, type 2   . Coronary atherosclerosis of native coronary artery     Nonobstructive  . Pulmonary nodule July 2009    Nonspecific 5 mm right middle lobe    Social History Ms. Ornstein reports that she has been smoking Cigarettes.  She has never used smokeless tobacco. Ms. Conerly reports that she does not drink alcohol.  Review of Systems Otherwise reviewed and negative.  Physical Examination Filed Vitals:   02/12/11 1525  BP:  136/65  Pulse: 54  Elderly woman in no acute distress seated in wheelchair. HEENT: Conjunctiva and lids normal, oropharynx with poor dentition. Neck: Supple, no elevated JVP or thyromegaly. Lungs: Diminished but clear breath sounds, non-labored. Cardiac: Regular rate and rhythm with soft systolic murmur at the apex, no significant diastolic murmur, no S3. Abdomen: Soft, nontender, bowel sounds present. Extremities: Mild ankle edema, distal pulses diminished. Skin: Warm and dry. Musculoskeletal: Mild kyphosis. Neuropsychiatric: Alert and oriented x3, affect grossly appropriate.    Studies Echocardiogram 01/19/2011: LVEF 55-60%, mildly thickened aortic valve with moderate aortic regurgitation, MAC with moderate mitral regurgitation, mild tricuspid regurgitation.  Problem List and Plan

## 2011-02-12 NOTE — Assessment & Plan Note (Signed)
Moderate aortic regurgitation. Followup echocardiogram scheduled for 6 months.

## 2011-02-12 NOTE — Assessment & Plan Note (Signed)
Moderate mitral regurgitation, followup echocardiogram scheduled for 6 months.

## 2011-02-12 NOTE — Assessment & Plan Note (Signed)
Nonobstructive by prior catheterization, no active angina. Cardiac markers were normal during her hospitalization.

## 2011-02-12 NOTE — Patient Instructions (Signed)
**Note De-Identified  Obfuscation** Your physician has requested that you have an echocardiogram. Echocardiography is a painless test that uses sound waves to create images of your heart. It provides your doctor with information about the size and shape of your heart and how well your heart's chambers and valves are working. This procedure takes approximately one hour. There are no restrictions for this procedure.  Your physician recommends that you return for lab work in: Next week  Your physician recommends that you schedule a follow-up appointment in: 6 months in [place] office.

## 2011-02-15 ENCOUNTER — Telehealth: Payer: Self-pay

## 2011-02-25 ENCOUNTER — Other Ambulatory Visit: Payer: Medicare Other | Admitting: *Deleted

## 2011-03-01 ENCOUNTER — Other Ambulatory Visit: Payer: Self-pay | Admitting: Cardiology

## 2011-03-04 ENCOUNTER — Other Ambulatory Visit: Payer: Medicare Other | Admitting: *Deleted

## 2011-03-10 ENCOUNTER — Other Ambulatory Visit: Payer: Medicare Other | Admitting: *Deleted

## 2011-05-05 ENCOUNTER — Other Ambulatory Visit: Payer: Self-pay | Admitting: *Deleted

## 2011-05-05 MED ORDER — ATORVASTATIN CALCIUM 10 MG PO TABS: 10.0000 mg | ORAL_TABLET | Freq: Every day | ORAL | Status: DC

## 2011-06-08 ENCOUNTER — Other Ambulatory Visit: Payer: Self-pay | Admitting: *Deleted

## 2011-06-08 ENCOUNTER — Other Ambulatory Visit: Payer: Self-pay | Admitting: Cardiology

## 2011-06-08 DIAGNOSIS — I10 Essential (primary) hypertension: Secondary | ICD-10-CM

## 2011-06-08 NOTE — Telephone Encounter (Signed)
Daughter called and questioned why  Message given to patient on last refill of lipitor that cholesterol labs were needed. Nurse informed daughter that patient was overdue for fasting labs. Lab order sent to [facility].

## 2011-06-28 ENCOUNTER — Telehealth: Payer: Self-pay | Admitting: *Deleted

## 2011-06-28 NOTE — Telephone Encounter (Signed)
Patient informed. 

## 2011-06-28 NOTE — Telephone Encounter (Signed)
Message copied by Eustace Moore on Mon Jun 28, 2011  1:10 PM ------      Message from: Murriel Hopper      Created: Fri Jun 25, 2011  5:37 PM                   ----- Message -----         From: Jonelle Sidle, MD         Sent: 06/25/2011  10:37 AM           To: Hoover Brunette, LPN            Reviewed. AST and ALT are normal. LDL at goal at 57.

## 2011-07-07 ENCOUNTER — Other Ambulatory Visit: Payer: Self-pay | Admitting: Cardiology

## 2011-09-09 ENCOUNTER — Other Ambulatory Visit: Payer: Self-pay | Admitting: Cardiology

## 2011-12-15 ENCOUNTER — Other Ambulatory Visit: Payer: Self-pay | Admitting: Cardiology

## 2012-04-05 ENCOUNTER — Other Ambulatory Visit: Payer: Self-pay | Admitting: Cardiology

## 2012-06-23 ENCOUNTER — Other Ambulatory Visit (HOSPITAL_COMMUNITY): Payer: Self-pay | Admitting: *Deleted

## 2012-06-27 ENCOUNTER — Other Ambulatory Visit (HOSPITAL_COMMUNITY): Payer: Self-pay | Admitting: Internal Medicine

## 2012-06-27 DIAGNOSIS — R911 Solitary pulmonary nodule: Secondary | ICD-10-CM

## 2012-07-04 ENCOUNTER — Encounter (HOSPITAL_COMMUNITY): Admission: RE | Admit: 2012-07-04 | Payer: Medicare Other | Source: Ambulatory Visit

## 2012-07-07 ENCOUNTER — Encounter (HOSPITAL_COMMUNITY)
Admission: RE | Admit: 2012-07-07 | Discharge: 2012-07-07 | Disposition: A | Discharge status: Home / Self Care | Payer: Medicare Other | Source: Ambulatory Visit | Attending: Internal Medicine | Admitting: Internal Medicine

## 2012-07-07 DIAGNOSIS — I2510 Atherosclerotic heart disease of native coronary artery without angina pectoris: Secondary | ICD-10-CM | POA: Insufficient documentation

## 2012-07-07 DIAGNOSIS — I517 Cardiomegaly: Secondary | ICD-10-CM | POA: Insufficient documentation

## 2012-07-07 DIAGNOSIS — R911 Solitary pulmonary nodule: Secondary | ICD-10-CM

## 2012-07-07 DIAGNOSIS — I700 Atherosclerosis of aorta: Secondary | ICD-10-CM | POA: Insufficient documentation

## 2012-07-07 DIAGNOSIS — N2889 Other specified disorders of kidney and ureter: Secondary | ICD-10-CM | POA: Insufficient documentation

## 2012-07-07 DIAGNOSIS — K449 Diaphragmatic hernia without obstruction or gangrene: Secondary | ICD-10-CM | POA: Insufficient documentation

## 2012-07-07 DIAGNOSIS — Z9089 Acquired absence of other organs: Secondary | ICD-10-CM | POA: Insufficient documentation

## 2012-07-07 LAB — GLUCOSE, CAPILLARY: Glucose-Capillary: 95 mg/dL (ref 70–99)

## 2012-07-07 MED ORDER — FLUDEOXYGLUCOSE F - 18 (FDG) INJECTION
19.6000 | Freq: Once | INTRAVENOUS | Status: AC | PRN
  Administered 2012-07-07: 19.6 via INTRAVENOUS

## 2012-08-07 ENCOUNTER — Other Ambulatory Visit: Payer: Self-pay | Admitting: Cardiology

## 2012-08-08 NOTE — Telephone Encounter (Signed)
Patient is past due for follow up.  

## 2012-08-10 ENCOUNTER — Other Ambulatory Visit: Payer: Self-pay | Admitting: Cardiology

## 2012-09-05 ENCOUNTER — Other Ambulatory Visit: Payer: Self-pay | Admitting: Cardiology

## 2012-09-06 ENCOUNTER — Other Ambulatory Visit: Payer: Self-pay | Admitting: Cardiology

## 2012-09-06 MED ORDER — ISOSORBIDE MONONITRATE ER 30 MG PO TB24: 30.0000 mg | ORAL_TABLET | Freq: Every day | ORAL | Status: DC

## 2012-09-11 ENCOUNTER — Other Ambulatory Visit: Payer: Self-pay | Admitting: Cardiology

## 2012-09-11 MED ORDER — ISOSORBIDE MONONITRATE ER 30 MG PO TB24: 30.0000 mg | ORAL_TABLET | Freq: Every day | ORAL | Status: DC

## 2012-10-19 ENCOUNTER — Encounter: Payer: Self-pay | Admitting: Cardiology

## 2012-10-19 ENCOUNTER — Ambulatory Visit (INDEPENDENT_AMBULATORY_CARE_PROVIDER_SITE_OTHER): Payer: Medicare Other | Admitting: Cardiology

## 2012-10-19 VITALS — BP 182/75 | HR 58 | Ht 66.0 in | Wt 170.0 lb

## 2012-10-19 DIAGNOSIS — I25810 Atherosclerosis of coronary artery bypass graft(s) without angina pectoris: Secondary | ICD-10-CM

## 2012-10-19 DIAGNOSIS — I2510 Atherosclerotic heart disease of native coronary artery without angina pectoris: Secondary | ICD-10-CM

## 2012-10-19 DIAGNOSIS — I10 Essential (primary) hypertension: Secondary | ICD-10-CM

## 2012-10-19 DIAGNOSIS — E782 Mixed hyperlipidemia: Secondary | ICD-10-CM

## 2012-10-19 MED ORDER — METOPROLOL TARTRATE 25 MG PO TABS: 25.0000 mg | ORAL_TABLET | Freq: Two times a day (BID) | ORAL | Status: DC

## 2012-10-19 MED ORDER — METOPROLOL TARTRATE 25 MG PO TABS: 12.5000 mg | ORAL_TABLET | Freq: Two times a day (BID) | ORAL | Status: DC

## 2012-10-19 NOTE — Assessment & Plan Note (Signed)
She continues on statin therapy, followed by Dr. Sherril Croon. Goal LDL should be close to 70 if possible.

## 2012-10-19 NOTE — Assessment & Plan Note (Signed)
Blood pressure is elevated today. She tells me that it is typically not this high. I recommended that she followup with Dr. Sherril Croon. Could consider addition of low-dose diuretic to her ACE inhibitor as a next step. We are also going to change her Lopressor to 12.5 mg twice daily for more adequate coverage.

## 2012-10-19 NOTE — Progress Notes (Signed)
Clinical Summary Ms. Pirro is a 72 y.o.female last seen in July 2012. She is referred back to [place] by Dr. Sherril Croon for routine followup. She remains functionally limited, uses a wheelchair and walker for short distances. She reports no angina symptoms. States that someone stays with her during the daytime. We reviewed her medications. She is taking Lopressor 25 mg orally once daily. Does state that her heart beat feels somewhat more forceful in the evenings.  Echocardiogram in June 2012 demonstrated LVEF 55-60%, mildly thickened aortic valve with moderate aortic regurgitation, MAC with moderate mitral regurgitation, mild tricuspid regurgitation.  She has a history of nonobstructive CAD documented at cardiac catheterization February 2010. She has had documentation of coronary calcifications by chest CT done more recently as well.  ECG today shows sinus bradycardia with left bundle branch block.  Allergies  Allergen Reactions  . Azithromycin   . Penicillins   . Tetracycline     Current Outpatient Prescriptions  Medication Sig Dispense Refill  . ALPRAZolam (XANAX) 0.25 MG tablet Take 0.25 mg by mouth 3 (three) times daily as needed.        Marland Kitchen aspirin 81 MG tablet Take 81 mg by mouth daily.        . busPIRone (BUSPAR) 10 MG tablet Take 10 mg by mouth 3 (three) times daily.       . Fluticasone-Salmeterol (ADVAIR DISKUS) 250-50 MCG/DOSE AEPB Inhale 1 puff into the lungs every 12 (twelve) hours.       Marland Kitchen HYDROcodone-acetaminophen (NORCO/VICODIN) 5-325 MG per tablet Take 1 tablet by mouth every 8 (eight) hours as needed.       Marland Kitchen ipratropium-albuterol (DUONEB) 0.5-2.5 (3) MG/3ML SOLN Take 3 mLs by nebulization 4 (four) times daily as needed.       . lamoTRIgine (LAMICTAL) 200 MG tablet Take 200 mg by mouth daily.        Marland Kitchen LIPITOR 10 MG tablet TAKE 1 TABLET ONCE DAILY.  90 each  3  . lisinopril (PRINIVIL,ZESTRIL) 20 MG tablet Take 20 mg by mouth daily.        . metFORMIN (GLUCOPHAGE) 500 MG tablet Take  500 mg by mouth 2 (two) times daily with a meal. 2 tablets in AM and 1 tablet in PM      . metoprolol tartrate (LOPRESSOR) 25 MG tablet Take 1 tablet (25 mg total) by mouth 2 (two) times daily.  60 tablet  6  . omeprazole (PRILOSEC) 40 MG capsule Take 40 mg by mouth daily.       . QUEtiapine (SEROQUEL) 25 MG tablet Take 25 mg by mouth at bedtime.        . sertraline (ZOLOFT) 100 MG tablet Take 100 mg by mouth daily.        . solifenacin (VESICARE) 10 MG tablet Take 10 mg by mouth daily.        Marland Kitchen tiotropium (SPIRIVA) 18 MCG inhalation capsule Place 18 mcg into inhaler and inhale daily.        . VENTOLIN HFA 108 (90 BASE) MCG/ACT inhaler Inhale 2 puffs into the lungs every 6 (six) hours as needed.        No current facility-administered medications for this visit.    Past Medical History  Diagnosis Date  . COPD (chronic obstructive pulmonary disease)   . Bipolar disorder   . TIA (transient ischemic attack)   . Dysphagia   . Essential hypertension, benign   . Diabetes mellitus, type 2   . Coronary atherosclerosis of  native coronary artery     Nonobstructive  . Pulmonary nodule July 2009    Nonspecific 5 mm right middle lobe    Social History Ms. Almon reports that she quit smoking about 2 years ago. Her smoking use included Cigarettes. She has a 29 pack-year smoking history. She has never used smokeless tobacco. Ms. Lourenco reports that she does not drink alcohol.  Review of Systems Negative except as outlined.  Physical Examination Filed Vitals:   10/19/12 1508  BP: 182/75  Pulse: 58   Filed Weights   10/19/12 1508  Weight: 170 lb (77.111 kg)    Elderly woman in no acute distress seated in wheelchair.  HEENT: Conjunctiva and lids normal, oropharynx with poor dentition.  Neck: Supple, no elevated JVP or thyromegaly.  Lungs: Diminished but clear breath sounds, non-labored.  Cardiac: Regular rate and rhythm with soft systolic murmur at the apex, no significant diastolic murmur,  no S3.  Abdomen: Soft, nontender, bowel sounds present.  Extremities: Mild ankle edema, distal pulses diminished.  Skin: Warm and dry.  Musculoskeletal: Mild kyphosis.  Neuropsychiatric: Alert and oriented x3, affect grossly appropriate.   Problem List and Plan   CORONARY ATHEROSCLEROSIS NATIVE CORONARY ARTERY History of nonobstructive disease based on previous assessment. No active angina. ECG reviewed. Will continue medical therapy. She is on aspirin, beta blocker, ACE inhibitor, and statin. Keep regular followup with Dr. Sherril Croon.  ESSENTIAL HYPERTENSION, BENIGN Blood pressure is elevated today. She tells me that it is typically not this high. I recommended that she followup with Dr. Sherril Croon. Could consider addition of low-dose diuretic to her ACE inhibitor as a next step. We are also going to change her Lopressor to 12.5 mg twice daily for more adequate coverage.  HYPERLIPIDEMIA, MIXED She continues on statin therapy, followed by Dr. Sherril Croon. Goal LDL should be close to 70 if possible.    Jonelle Sidle, M.D., F.A.C.C.

## 2012-10-19 NOTE — Assessment & Plan Note (Signed)
History of nonobstructive disease based on previous assessment. No active angina. ECG reviewed. Will continue medical therapy. She is on aspirin, beta blocker, ACE inhibitor, and statin. Keep regular followup with Dr. Sherril Croon.

## 2012-10-19 NOTE — Patient Instructions (Addendum)
   Change Lopressor to 25mg  (1/2 tab - 12.5mg ) twice a day  Continue all other current medications. Continue follow up with Dr. Sherril Croon as planned Your physician wants you to follow up in:  1 year.  You will receive a reminder letter in the mail one-two months in advance.  If you don't receive a letter, please call our office to schedule the follow up appointment

## 2012-12-01 ENCOUNTER — Other Ambulatory Visit: Payer: Self-pay | Admitting: Cardiology

## 2013-03-05 ENCOUNTER — Other Ambulatory Visit: Payer: Self-pay | Admitting: Cardiology

## 2013-03-05 MED ORDER — ATORVASTATIN CALCIUM 10 MG PO TABS: 10.0000 mg | ORAL_TABLET | Freq: Every day | ORAL | Status: DC

## 2013-06-01 ENCOUNTER — Other Ambulatory Visit: Payer: Self-pay | Admitting: Cardiology

## 2013-09-28 ENCOUNTER — Other Ambulatory Visit: Payer: Self-pay | Admitting: Cardiology

## 2013-10-29 ENCOUNTER — Other Ambulatory Visit: Payer: Self-pay | Admitting: Cardiology

## 2013-11-20 ENCOUNTER — Encounter: Payer: Self-pay | Admitting: Cardiology

## 2013-11-20 ENCOUNTER — Ambulatory Visit (INDEPENDENT_AMBULATORY_CARE_PROVIDER_SITE_OTHER): Payer: Medicare Other | Admitting: Cardiology

## 2013-11-20 VITALS — BP 130/73 | HR 62 | Ht 66.0 in | Wt 189.0 lb

## 2013-11-20 DIAGNOSIS — I359 Nonrheumatic aortic valve disorder, unspecified: Secondary | ICD-10-CM

## 2013-11-20 DIAGNOSIS — I2510 Atherosclerotic heart disease of native coronary artery without angina pectoris: Secondary | ICD-10-CM

## 2013-11-20 DIAGNOSIS — I059 Rheumatic mitral valve disease, unspecified: Secondary | ICD-10-CM

## 2013-11-20 DIAGNOSIS — E782 Mixed hyperlipidemia: Secondary | ICD-10-CM

## 2013-11-20 NOTE — Assessment & Plan Note (Signed)
History of nonobstructive disease based on previous testing, no active angina symptoms. Continue aspirin and statin therapy.

## 2013-11-20 NOTE — Patient Instructions (Signed)
Continue all current medications. Your physician wants you to follow up in:  1 year.  You will receive a reminder letter in the mail one-two months in advance.  If you don't receive a letter, please call our office to schedule the follow up appointment   

## 2013-11-20 NOTE — Assessment & Plan Note (Signed)
Moderate by echocardiogram as outlined above. No change in examination.

## 2013-11-20 NOTE — Assessment & Plan Note (Signed)
Continues on Lipitor, keep follow-up with Dr. Vyas. 

## 2013-11-20 NOTE — Assessment & Plan Note (Signed)
Moderate by echocardiogram as outlined above, no change in examination.

## 2013-11-20 NOTE — Progress Notes (Signed)
Clinical Summary Ms. Aronoff is a 73 y.o.female last seen in March 2014. She is here with an Geophysicist/field seismologist today. Reports no major health changes. She is able to do some of her ADLs but does require help with baths and cooking. She has gained weight, states she has been eating very well. No chest pain or shortness of breath  ECG today shows sinus rhythm with IVCD.  Echocardiogram in June 2012 demonstrated LVEF 55-60%, mildly thickened aortic valve with moderate aortic regurgitation, MAC with moderate mitral regurgitation, mild tricuspid regurgitation.   Allergies  Allergen Reactions  . Azithromycin   . Penicillins   . Tetracycline     Current Outpatient Prescriptions  Medication Sig Dispense Refill  . ALPRAZolam (XANAX) 0.25 MG tablet Take 0.25 mg by mouth 3 (three) times daily as needed.        Marland Kitchen aspirin 81 MG tablet Take 81 mg by mouth daily.        Marland Kitchen atorvastatin (LIPITOR) 10 MG tablet TAKE 1 TABLET ONCE DAILY.  30 tablet  6  . HYDROcodone-acetaminophen (NORCO/VICODIN) 5-325 MG per tablet Take 1 tablet by mouth every 8 (eight) hours as needed.       Marland Kitchen ipratropium-albuterol (DUONEB) 0.5-2.5 (3) MG/3ML SOLN Take 3 mLs by nebulization 4 (four) times daily as needed.       . lamoTRIgine (LAMICTAL) 200 MG tablet Take 200 mg by mouth daily.        Marland Kitchen lisinopril (PRINIVIL,ZESTRIL) 20 MG tablet Take 20 mg by mouth daily.        . metFORMIN (GLUCOPHAGE) 500 MG tablet Take 500 mg by mouth 2 (two) times daily with a meal. 2 tablets in AM and 1 tablet in PM      . metoprolol tartrate (LOPRESSOR) 25 MG tablet TAKE (1/2) TABLET BY MOUTH TWICE DAILY.  30 tablet  11  . omeprazole (PRILOSEC) 40 MG capsule Take 40 mg by mouth daily.       . QUEtiapine (SEROQUEL) 25 MG tablet Take 25 mg by mouth at bedtime.        . sertraline (ZOLOFT) 100 MG tablet Take 100 mg by mouth daily.        . solifenacin (VESICARE) 10 MG tablet Take 10 mg by mouth daily.        Marland Kitchen tiotropium (SPIRIVA) 18 MCG inhalation capsule  Place 18 mcg into inhaler and inhale daily.        . VENTOLIN HFA 108 (90 BASE) MCG/ACT inhaler Inhale 2 puffs into the lungs every 6 (six) hours as needed.        No current facility-administered medications for this visit.    Past Medical History  Diagnosis Date  . COPD (chronic obstructive pulmonary disease)   . Bipolar disorder   . TIA (transient ischemic attack)   . Dysphagia   . Essential hypertension, benign   . Diabetes mellitus, type 2   . Coronary atherosclerosis of native coronary artery     Nonobstructive  . Pulmonary nodule July 2009    Nonspecific 5 mm right middle lobe    Social History Ms. Tewell reports that she quit smoking about 3 years ago. Her smoking use included Cigarettes. She has a 29 pack-year smoking history. She has never used smokeless tobacco. Ms. Pinder reports that she does not drink alcohol.  Review of Systems No palpitations, dizziness, syncope. No orthopnea or PND. Using a wheelchair. Otherwise negative.  Physical Examination Filed Vitals:   11/20/13 1134  BP: 130/73  Pulse: 62   Filed Weights   11/20/13 1134  Weight: 189 lb (85.73 kg)    Elderly woman in no acute distress seated in wheelchair.  HEENT: Conjunctiva and lids normal, oropharynx with poor dentition.  Neck: Supple, no elevated JVP or thyromegaly.  Lungs: Diminished but clear breath sounds, non-labored.  Cardiac: Regular rate and rhythm with soft systolic murmur at the apex, no significant diastolic murmur, no S3.  Abdomen: Soft, nontender, bowel sounds present.  Extremities: Mild ankle edema, distal pulses diminished.    Problem List and Plan   CORONARY ATHEROSCLEROSIS NATIVE CORONARY ARTERY History of nonobstructive disease based on previous testing, no active angina symptoms. Continue aspirin and statin therapy.  HYPERLIPIDEMIA, MIXED Continues on Lipitor, keep followup with Dr. Sherril Croon.  Mitral valve disorders Moderate by echocardiogram as outlined above. No change in  examination.  Aortic valve disorders Moderate by echocardiogram as outlined above, no change in examination.    Jonelle Sidle, M.D., F.A.C.C.

## 2014-01-23 ENCOUNTER — Emergency Department (HOSPITAL_COMMUNITY): Payer: Medicare Other

## 2014-01-23 ENCOUNTER — Encounter (HOSPITAL_COMMUNITY): Payer: Self-pay | Admitting: Emergency Medicine

## 2014-01-23 ENCOUNTER — Inpatient Hospital Stay (HOSPITAL_COMMUNITY)
Admission: EM | Admit: 2014-01-23 | Discharge: 2014-01-28 | DRG: 481 | Disposition: A | Discharge status: Skilled Nursing Facility | Payer: Medicare Other | Attending: Internal Medicine | Admitting: Internal Medicine

## 2014-01-23 DIAGNOSIS — I10 Essential (primary) hypertension: Secondary | ICD-10-CM | POA: Diagnosis present

## 2014-01-23 DIAGNOSIS — Z8673 Personal history of transient ischemic attack (TIA), and cerebral infarction without residual deficits: Secondary | ICD-10-CM

## 2014-01-23 DIAGNOSIS — Z66 Do not resuscitate: Secondary | ICD-10-CM | POA: Diagnosis present

## 2014-01-23 DIAGNOSIS — F411 Generalized anxiety disorder: Secondary | ICD-10-CM | POA: Diagnosis present

## 2014-01-23 DIAGNOSIS — N183 Chronic kidney disease, stage 3 unspecified: Secondary | ICD-10-CM | POA: Diagnosis present

## 2014-01-23 DIAGNOSIS — F17200 Nicotine dependence, unspecified, uncomplicated: Secondary | ICD-10-CM

## 2014-01-23 DIAGNOSIS — K219 Gastro-esophageal reflux disease without esophagitis: Secondary | ICD-10-CM | POA: Diagnosis present

## 2014-01-23 DIAGNOSIS — I2510 Atherosclerotic heart disease of native coronary artery without angina pectoris: Secondary | ICD-10-CM | POA: Diagnosis present

## 2014-01-23 DIAGNOSIS — G5790 Unspecified mononeuropathy of unspecified lower limb: Secondary | ICD-10-CM | POA: Diagnosis present

## 2014-01-23 DIAGNOSIS — Z9849 Cataract extraction status, unspecified eye: Secondary | ICD-10-CM

## 2014-01-23 DIAGNOSIS — F319 Bipolar disorder, unspecified: Secondary | ICD-10-CM | POA: Diagnosis present

## 2014-01-23 DIAGNOSIS — S72499A Other fracture of lower end of unspecified femur, initial encounter for closed fracture: Principal | ICD-10-CM | POA: Diagnosis present

## 2014-01-23 DIAGNOSIS — Z87891 Personal history of nicotine dependence: Secondary | ICD-10-CM

## 2014-01-23 DIAGNOSIS — S7292XA Unspecified fracture of left femur, initial encounter for closed fracture: Secondary | ICD-10-CM

## 2014-01-23 DIAGNOSIS — Z8249 Family history of ischemic heart disease and other diseases of the circulatory system: Secondary | ICD-10-CM | POA: Diagnosis not present

## 2014-01-23 DIAGNOSIS — D62 Acute posthemorrhagic anemia: Secondary | ICD-10-CM | POA: Diagnosis not present

## 2014-01-23 DIAGNOSIS — Z96659 Presence of unspecified artificial knee joint: Secondary | ICD-10-CM | POA: Diagnosis not present

## 2014-01-23 DIAGNOSIS — E119 Type 2 diabetes mellitus without complications: Secondary | ICD-10-CM | POA: Diagnosis present

## 2014-01-23 DIAGNOSIS — Z881 Allergy status to other antibiotic agents status: Secondary | ICD-10-CM

## 2014-01-23 DIAGNOSIS — Z880 Allergy status to penicillin: Secondary | ICD-10-CM | POA: Diagnosis not present

## 2014-01-23 DIAGNOSIS — I059 Rheumatic mitral valve disease, unspecified: Secondary | ICD-10-CM | POA: Diagnosis present

## 2014-01-23 DIAGNOSIS — J449 Chronic obstructive pulmonary disease, unspecified: Secondary | ICD-10-CM | POA: Diagnosis present

## 2014-01-23 DIAGNOSIS — R55 Syncope and collapse: Secondary | ICD-10-CM

## 2014-01-23 DIAGNOSIS — Z9089 Acquired absence of other organs: Secondary | ICD-10-CM

## 2014-01-23 DIAGNOSIS — I080 Rheumatic disorders of both mitral and aortic valves: Secondary | ICD-10-CM | POA: Diagnosis present

## 2014-01-23 DIAGNOSIS — N390 Urinary tract infection, site not specified: Secondary | ICD-10-CM | POA: Diagnosis present

## 2014-01-23 DIAGNOSIS — Y92009 Unspecified place in unspecified non-institutional (private) residence as the place of occurrence of the external cause: Secondary | ICD-10-CM | POA: Diagnosis not present

## 2014-01-23 DIAGNOSIS — E782 Mixed hyperlipidemia: Secondary | ICD-10-CM | POA: Diagnosis present

## 2014-01-23 DIAGNOSIS — F0390 Unspecified dementia without behavioral disturbance: Secondary | ICD-10-CM | POA: Diagnosis present

## 2014-01-23 DIAGNOSIS — I359 Nonrheumatic aortic valve disorder, unspecified: Secondary | ICD-10-CM | POA: Diagnosis present

## 2014-01-23 DIAGNOSIS — I447 Left bundle-branch block, unspecified: Secondary | ICD-10-CM | POA: Diagnosis present

## 2014-01-23 DIAGNOSIS — Z79899 Other long term (current) drug therapy: Secondary | ICD-10-CM

## 2014-01-23 DIAGNOSIS — I129 Hypertensive chronic kidney disease with stage 1 through stage 4 chronic kidney disease, or unspecified chronic kidney disease: Secondary | ICD-10-CM | POA: Diagnosis present

## 2014-01-23 DIAGNOSIS — Z7982 Long term (current) use of aspirin: Secondary | ICD-10-CM

## 2014-01-23 DIAGNOSIS — J4489 Other specified chronic obstructive pulmonary disease: Secondary | ICD-10-CM | POA: Diagnosis present

## 2014-01-23 DIAGNOSIS — S7290XA Unspecified fracture of unspecified femur, initial encounter for closed fracture: Secondary | ICD-10-CM

## 2014-01-23 DIAGNOSIS — R001 Bradycardia, unspecified: Secondary | ICD-10-CM

## 2014-01-23 DIAGNOSIS — M25569 Pain in unspecified knee: Secondary | ICD-10-CM | POA: Diagnosis present

## 2014-01-23 DIAGNOSIS — W050XXA Fall from non-moving wheelchair, initial encounter: Secondary | ICD-10-CM | POA: Diagnosis present

## 2014-01-23 HISTORY — DX: Depression, unspecified: F32.A

## 2014-01-23 HISTORY — DX: Anxiety disorder, unspecified: F41.9

## 2014-01-23 HISTORY — DX: Major depressive disorder, single episode, unspecified: F32.9

## 2014-01-23 HISTORY — DX: Unspecified osteoarthritis, unspecified site: M19.90

## 2014-01-23 HISTORY — DX: Gastro-esophageal reflux disease without esophagitis: K21.9

## 2014-01-23 HISTORY — DX: Pneumonia, unspecified organism: J18.9

## 2014-01-23 LAB — CBC WITH DIFFERENTIAL/PLATELET
BASOS ABS: 0 10*3/uL (ref 0.0–0.1)
Basophils Relative: 0 % (ref 0–1)
EOS ABS: 0.1 10*3/uL (ref 0.0–0.7)
EOS PCT: 1 % (ref 0–5)
HCT: 33.8 % — ABNORMAL LOW (ref 36.0–46.0)
Hemoglobin: 10.9 g/dL — ABNORMAL LOW (ref 12.0–15.0)
LYMPHS ABS: 1.3 10*3/uL (ref 0.7–4.0)
Lymphocytes Relative: 12 % (ref 12–46)
MCH: 25.5 pg — AB (ref 26.0–34.0)
MCHC: 32.2 g/dL (ref 30.0–36.0)
MCV: 79 fL (ref 78.0–100.0)
Monocytes Absolute: 0.7 10*3/uL (ref 0.1–1.0)
Monocytes Relative: 7 % (ref 3–12)
Neutro Abs: 8.4 10*3/uL — ABNORMAL HIGH (ref 1.7–7.7)
Neutrophils Relative %: 80 % — ABNORMAL HIGH (ref 43–77)
PLATELETS: 173 10*3/uL (ref 150–400)
RBC: 4.28 MIL/uL (ref 3.87–5.11)
RDW: 14.7 % (ref 11.5–15.5)
WBC: 10.4 10*3/uL (ref 4.0–10.5)

## 2014-01-23 LAB — BASIC METABOLIC PANEL
Anion gap: 14 (ref 5–15)
BUN: 23 mg/dL (ref 6–23)
CALCIUM: 9.1 mg/dL (ref 8.4–10.5)
CO2: 26 mEq/L (ref 19–32)
Chloride: 99 mEq/L (ref 96–112)
Creatinine, Ser: 1.36 mg/dL — ABNORMAL HIGH (ref 0.50–1.10)
GFR calc Af Amer: 44 mL/min — ABNORMAL LOW (ref >90)
GFR, EST NON AFRICAN AMERICAN: 38 mL/min — AB (ref >90)
GLUCOSE: 149 mg/dL — AB (ref 70–99)
Potassium: 4 mEq/L (ref 3.7–5.3)
SODIUM: 139 meq/L (ref 137–147)

## 2014-01-23 LAB — URINALYSIS, ROUTINE W REFLEX MICROSCOPIC
BILIRUBIN URINE: NEGATIVE
GLUCOSE, UA: NEGATIVE mg/dL
KETONES UR: NEGATIVE mg/dL
Nitrite: POSITIVE — AB
Specific Gravity, Urine: 1.015 (ref 1.005–1.030)
Urobilinogen, UA: 0.2 mg/dL (ref 0.0–1.0)
pH: 6 (ref 5.0–8.0)

## 2014-01-23 LAB — URINE MICROSCOPIC-ADD ON

## 2014-01-23 MED ORDER — HYDROCODONE-ACETAMINOPHEN 5-325 MG PO TABS
1.0000 | ORAL_TABLET | Freq: Four times a day (QID) | ORAL | Status: DC | PRN
  Administered 2014-01-23 – 2014-01-25 (×4): 2 via ORAL
  Filled 2014-01-23 (×5): qty 2

## 2014-01-23 MED ORDER — ONDANSETRON HCL 4 MG/2ML IJ SOLN
4.0000 mg | Freq: Once | INTRAMUSCULAR | Status: AC
  Administered 2014-01-23: 4 mg via INTRAVENOUS
  Filled 2014-01-23: qty 2

## 2014-01-23 MED ORDER — SERTRALINE HCL 100 MG PO TABS
100.0000 mg | ORAL_TABLET | Freq: Two times a day (BID) | ORAL | Status: DC
  Administered 2014-01-24 – 2014-01-28 (×9): 100 mg via ORAL
  Filled 2014-01-23 (×11): qty 1

## 2014-01-23 MED ORDER — QUETIAPINE FUMARATE 25 MG PO TABS
25.0000 mg | ORAL_TABLET | Freq: Every day | ORAL | Status: DC
  Administered 2014-01-24 – 2014-01-27 (×5): 25 mg via ORAL
  Filled 2014-01-23 (×6): qty 1

## 2014-01-23 MED ORDER — LISINOPRIL 20 MG PO TABS
20.0000 mg | ORAL_TABLET | Freq: Every day | ORAL | Status: DC
  Filled 2014-01-23 (×2): qty 1

## 2014-01-23 MED ORDER — ATORVASTATIN CALCIUM 10 MG PO TABS
10.0000 mg | ORAL_TABLET | Freq: Every day | ORAL | Status: DC
  Administered 2014-01-24 – 2014-01-27 (×4): 10 mg via ORAL
  Filled 2014-01-23 (×6): qty 1

## 2014-01-23 MED ORDER — METOPROLOL TARTRATE 12.5 MG HALF TABLET
12.5000 mg | ORAL_TABLET | Freq: Two times a day (BID) | ORAL | Status: DC
  Administered 2014-01-24 – 2014-01-28 (×7): 12.5 mg via ORAL
  Filled 2014-01-23 (×11): qty 1

## 2014-01-23 MED ORDER — METHOCARBAMOL 500 MG PO TABS
500.0000 mg | ORAL_TABLET | Freq: Four times a day (QID) | ORAL | Status: DC | PRN
  Administered 2014-01-23 – 2014-01-25 (×4): 500 mg via ORAL
  Filled 2014-01-23 (×4): qty 1

## 2014-01-23 MED ORDER — PANTOPRAZOLE SODIUM 40 MG PO TBEC
40.0000 mg | DELAYED_RELEASE_TABLET | Freq: Every day | ORAL | Status: DC
  Administered 2014-01-25 – 2014-01-28 (×4): 40 mg via ORAL
  Filled 2014-01-23 (×3): qty 1

## 2014-01-23 MED ORDER — ENOXAPARIN SODIUM 40 MG/0.4ML ~~LOC~~ SOLN
40.0000 mg | Freq: Every day | SUBCUTANEOUS | Status: DC
  Administered 2014-01-25 – 2014-01-26 (×2): 40 mg via SUBCUTANEOUS
  Filled 2014-01-23 (×4): qty 0.4

## 2014-01-23 MED ORDER — FENTANYL CITRATE 0.05 MG/ML IJ SOLN
25.0000 ug | INTRAMUSCULAR | Status: AC | PRN
  Administered 2014-01-23 (×3): 25 ug via INTRAVENOUS
  Administered 2014-01-24: 50 ug via INTRAVENOUS
  Filled 2014-01-23 (×3): qty 2

## 2014-01-23 MED ORDER — LAMOTRIGINE 200 MG PO TABS
200.0000 mg | ORAL_TABLET | Freq: Every day | ORAL | Status: DC
  Administered 2014-01-25 – 2014-01-28 (×4): 200 mg via ORAL
  Filled 2014-01-23 (×5): qty 1

## 2014-01-23 MED ORDER — INSULIN ASPART 100 UNIT/ML ~~LOC~~ SOLN
0.0000 [IU] | Freq: Three times a day (TID) | SUBCUTANEOUS | Status: DC
  Administered 2014-01-25 – 2014-01-27 (×4): 1 [IU] via SUBCUTANEOUS

## 2014-01-23 MED ORDER — DARIFENACIN HYDROBROMIDE ER 7.5 MG PO TB24
7.5000 mg | ORAL_TABLET | Freq: Every day | ORAL | Status: DC
  Administered 2014-01-25 – 2014-01-28 (×4): 7.5 mg via ORAL
  Filled 2014-01-23 (×5): qty 1

## 2014-01-23 MED ORDER — IPRATROPIUM-ALBUTEROL 0.5-2.5 (3) MG/3ML IN SOLN: 3.0000 mL | Freq: Four times a day (QID) | RESPIRATORY_TRACT | Status: DC | PRN

## 2014-01-23 MED ORDER — ALPRAZOLAM 0.25 MG PO TABS: 0.2500 mg | ORAL_TABLET | Freq: Every day | ORAL | Status: DC

## 2014-01-23 MED ORDER — SODIUM CHLORIDE 0.9 % IV SOLN: INTRAVENOUS | Status: AC

## 2014-01-23 MED ORDER — ALPRAZOLAM 0.5 MG PO TABS
0.5000 mg | ORAL_TABLET | Freq: Every day | ORAL | Status: DC
  Administered 2014-01-24: 0.5 mg via ORAL
  Filled 2014-01-23: qty 1

## 2014-01-23 MED ORDER — ALBUTEROL SULFATE (2.5 MG/3ML) 0.083% IN NEBU: 2.5000 mg | INHALATION_SOLUTION | Freq: Four times a day (QID) | RESPIRATORY_TRACT | Status: DC | PRN

## 2014-01-23 MED ORDER — ALPRAZOLAM 0.25 MG PO TABS: 0.2500 mg | ORAL_TABLET | Freq: Two times a day (BID) | ORAL | Status: DC

## 2014-01-23 MED ORDER — METHOCARBAMOL 1000 MG/10ML IJ SOLN
500.0000 mg | Freq: Four times a day (QID) | INTRAVENOUS | Status: DC | PRN
  Filled 2014-01-23: qty 5

## 2014-01-23 NOTE — ED Notes (Addendum)
Family in pts room states that pt uses a wheelchair to get around. They say that pt was being helped from her chair to her wheelchair. Pt slipped down to the floor with her socks on and pts knee bucked under her.

## 2014-01-23 NOTE — ED Provider Notes (Signed)
TIME SEEN:  7:00 PM  CHIEF COMPLAINT: Fall, Knee Injury  HPI: HPI Comments: Brenda Mcdonald is a 73 y.o. female with history of bipolar disorder, TIA, COPD, hypertension, diabetes, nonobstructive CAD who presents to the Emergency Department complaining of left knee pain. She reports that she had a knee replacement with Dr. Jonny Ruiz Rendall in 2000.  She reports that her husband was helping her get out of a recliner this morning and she fell forward landing on her left knee. She did not hit her head or lose consciousness. She is complaining of significant amount of left knee pain. She's also having some left hip pain as well. No neck or back pain. No numbness or tingling, focal weakness. No chest pain or shortness of breath. No abdominal pain. She last ate this morning at breakfast. She is not on anticoagulation.  ROS: See HPI Constitutional: no fever  Eyes: no drainage  ENT: no runny nose   Cardiovascular:  no chest pain  Resp: no SOB  GI: no vomiting GU: no dysuria Integumentary: no rash  Allergy: no hives  Musculoskeletal: no leg swelling  Neurological: no slurred speech ROS otherwise negative  PAST MEDICAL HISTORY/PAST SURGICAL HISTORY:  Past Medical History  Diagnosis Date  . COPD (chronic obstructive pulmonary disease)   . Bipolar disorder   . TIA (transient ischemic attack)   . Dysphagia   . Essential hypertension, benign   . Diabetes mellitus, type 2   . Coronary atherosclerosis of native coronary artery     Nonobstructive  . Pulmonary nodule July 2009    Nonspecific 5 mm right middle lobe    MEDICATIONS:  Prior to Admission medications   Medication Sig Start Date End Date Taking? Authorizing Provider  ALPRAZolam (XANAX) 0.25 MG tablet Take 0.25 mg by mouth 3 (three) times daily as needed.      Historical Provider, MD  aspirin 81 MG tablet Take 81 mg by mouth daily.      Historical Provider, MD  atorvastatin (LIPITOR) 10 MG tablet TAKE 1 TABLET ONCE DAILY. 10/29/13   Jonelle Sidle, MD  HYDROcodone-acetaminophen (NORCO/VICODIN) 5-325 MG per tablet Take 1 tablet by mouth every 8 (eight) hours as needed.  10/11/12   Historical Provider, MD  ipratropium-albuterol (DUONEB) 0.5-2.5 (3) MG/3ML SOLN Take 3 mLs by nebulization 4 (four) times daily as needed.     Historical Provider, MD  lamoTRIgine (LAMICTAL) 200 MG tablet Take 200 mg by mouth daily.      Historical Provider, MD  lisinopril (PRINIVIL,ZESTRIL) 20 MG tablet Take 20 mg by mouth daily.      Historical Provider, MD  metFORMIN (GLUCOPHAGE) 500 MG tablet Take 500 mg by mouth 2 (two) times daily with a meal. 2 tablets in AM and 1 tablet in PM    Historical Provider, MD  metoprolol tartrate (LOPRESSOR) 25 MG tablet TAKE (1/2) TABLET BY MOUTH TWICE DAILY. 06/01/13   Jonelle Sidle, MD  omeprazole (PRILOSEC) 40 MG capsule Take 40 mg by mouth daily.  10/03/12   Historical Provider, MD  QUEtiapine (SEROQUEL) 25 MG tablet Take 25 mg by mouth at bedtime.      Historical Provider, MD  sertraline (ZOLOFT) 100 MG tablet Take 100 mg by mouth daily.      Historical Provider, MD  solifenacin (VESICARE) 10 MG tablet Take 10 mg by mouth daily.      Historical Provider, MD  tiotropium (SPIRIVA) 18 MCG inhalation capsule Place 18 mcg into inhaler and inhale daily.  Historical Provider, MD  VENTOLIN HFA 108 (90 BASE) MCG/ACT inhaler Inhale 2 puffs into the lungs every 6 (six) hours as needed.  07/31/12   Historical Provider, MD    ALLERGIES:  Allergies  Allergen Reactions  . Azithromycin   . Penicillins   . Tetracycline     SOCIAL HISTORY:  History  Substance Use Topics  . Smoking status: Former Smoker -- 0.50 packs/day for 58 years    Types: Cigarettes    Quit date: 07/26/2010  . Smokeless tobacco: Never Used  . Alcohol Use: No    FAMILY HISTORY: Family History  Problem Relation Age of Onset  . Coronary artery disease      EXAM: Triage Vitals: BP 171/79  Pulse 67  Temp(Src) 98.3 F (36.8 C) (Oral)  Resp  22  SpO2 100% CONSTITUTIONAL: Alert and oriented and responds appropriately to questions. Well-appearing; well-nourished; GCS 15, elderly, appears uncomfortable HEAD: Normocephalic; atraumatic EYES: Conjunctivae clear, PERRL, EOMI ENT: normal nose; no rhinorrhea; moist mucous membranes; pharynx without lesions noted; no dental injury; no septal hematoma NECK: Supple, no meningismus, no LAD; no midline spinal tenderness, step-off or deformity CARD: RRR; S1 and S2 appreciated; no murmurs, no clicks, no rubs, no gallops RESP: Normal chest excursion without splinting or tachypnea; breath sounds clear and equal bilaterally; no wheezes, no rhonchi, no rales; chest wall stable, nontender to palpation ABD/GI: Normal bowel sounds; non-distended; soft, non-tender, no rebound, no guarding PELVIS:  stable, nontender to palpation BACK:  The back appears normal and is non-tender to palpation, there is no CVA tenderness; no midline spinal tenderness, step-off or deformity EXT: Patient's left knee is significantly swollen and tender to palpation, compartments are soft, patient is tender to palpation of the left hip, 2+ DP pulses bilaterally, sensation to light touch intact diffusely, otherwise Normal ROM in all joints; otherwise non-tender to palpation; no edema; normal capillary refill; no cyanosis; no leg length discrepancy   SKIN: Normal color for age and race; warm NEURO: Moves all extremities equally; sensation to light-touch intact diffusely, cranial nerves II through XII intact PSYCH: The patient's mood and manner are appropriate. Grooming and personal hygiene are appropriate.  MEDICAL DECISION MAKING: Patient here after fall from recliner with distal comminuted femoral fracture or at the level of her knee replacement. She is also complaining of left hip pain. We'll obtain pelvis imaging, femur imaging, preop chest x-ray and EKG, basic labs. Will keep her n.p.o. at this time. We'll give fentanyl and Zofran.  We'll discuss with her orthopedist.  ED PROGRESS: Spoke with Dr. Victorino Dike with orthopedics who recommend discussing with Dr. Thurston Hole given this was a knee replacement by Dr. Priscille Kluver.  Dr. Thurston Hole agrees to see the patient in consult and would like for her to be admitted to medicine, transferred to 10. Her screening labs are unremarkable. Chest x-ray negative. Right hip and femur x-rays showed no other injuries.   Spoke with Dr. Sharl Ma with hospitalist service who will help arrange admission at [facility] to medicine service.    EKG Interpretation  Date/Time:  Wednesday January 23 2014 20:09:33 EDT Ventricular Rate:  70 PR Interval:  190 QRS Duration: 121 QT Interval:  467 QTC Calculation: 504 R Axis:   63 Text Interpretation:  Sinus rhythm Probable left ventricular hypertrophy Left bundle branch block Prolonged QT interval Confirmed by Shatasia Cutshaw,  DO, Syniyah Bourne ([id number]) on 01/23/2014 8:12:01 PM             Layla Maw Kyland No, DO 01/23/14 2018

## 2014-01-23 NOTE — H&P (Signed)
PCP:   Ignatius Specking., MD   Chief Complaint:  Fall, knee injury  HPI: 73 year old female who   has a past medical history of COPD (chronic obstructive pulmonary disease); Bipolar disorder; TIA (transient ischemic attack); Dysphagia; Essential hypertension, benign; Diabetes mellitus, type 2; Coronary atherosclerosis of native coronary artery; and Pulmonary nodule (July 2009). Today presents to the ED, after patient fell at home. Patient is wheelchair-bound to and her husband was helping her getting out of PICC line are when she fell forward landing on the left knee. Patient has a history of knee replacement with Dr. Bard Herbert in 2000. After patient fell she had significant 10/10 left knee pain, there was no loss of consciousness. Paramedics were called and patient was brought to the ED. She denies passing out, no chest pain or shortness of breath. Patient does have a history of moderate mitral regurgitation and has been followed by cardiology. She has a history of cardiac cath which showed nonobstructive CAD. Patient also has a history of TIA and is currently on aspirin 81 mg by mouth daily. At this time pain has improved but patient gets worsening of pain on the moment of the leg. The pain does not radiate and is located above the left knee.  Allergies:   Allergies  Allergen Reactions  . Azithromycin   . Penicillins   . Tetracycline       Past Medical History  Diagnosis Date  . COPD (chronic obstructive pulmonary disease)   . Bipolar disorder   . TIA (transient ischemic attack)   . Dysphagia   . Essential hypertension, benign   . Diabetes mellitus, type 2   . Coronary atherosclerosis of native coronary artery     Nonobstructive  . Pulmonary nodule July 2009    Nonspecific 5 mm right middle lobe    Past Surgical History  Procedure Laterality Date  . Appendectomy    . Cholecystectomy    . Vesicovaginal fistula closure w/ tah    . Total knee arthroplasty    . Knee  arthroscopy      Prior to Admission medications   Medication Sig Start Date End Date Taking? Authorizing Provider  ALPRAZolam (XANAX) 0.25 MG tablet Take 0.25-0.5 mg by mouth 2 (two) times daily. Patient takes 1 tablet in the morning and 2 tablets at bedtime   Yes Historical Provider, MD  aspirin EC 81 MG tablet Take 81 mg by mouth daily.   Yes Historical Provider, MD  atorvastatin (LIPITOR) 10 MG tablet Take 10 mg by mouth at bedtime.   Yes Historical Provider, MD  HYDROcodone-acetaminophen (NORCO/VICODIN) 5-325 MG per tablet Take 1 tablet by mouth every 8 (eight) hours as needed. pain 10/11/12  Yes Historical Provider, MD  ipratropium-albuterol (DUONEB) 0.5-2.5 (3) MG/3ML SOLN Take 3 mLs by nebulization 4 (four) times daily as needed. Shortness of breath   Yes Historical Provider, MD  lamoTRIgine (LAMICTAL) 200 MG tablet Take 200 mg by mouth daily.     Yes Historical Provider, MD  lisinopril (PRINIVIL,ZESTRIL) 20 MG tablet Take 20 mg by mouth daily.     Yes Historical Provider, MD  metFORMIN (GLUCOPHAGE) 500 MG tablet Take 500 mg by mouth every evening.    Yes Historical Provider, MD  metoprolol tartrate (LOPRESSOR) 25 MG tablet Take 12.5 mg by mouth 2 (two) times daily.   Yes Historical Provider, MD  omeprazole (PRILOSEC) 40 MG capsule Take 40 mg by mouth daily.  10/03/12  Yes Historical Provider, MD  QUEtiapine (SEROQUEL) 25  MG tablet Take 25 mg by mouth at bedtime.     Yes Historical Provider, MD  sertraline (ZOLOFT) 100 MG tablet Take 100 mg by mouth 2 (two) times daily.    Yes Historical Provider, MD  solifenacin (VESICARE) 10 MG tablet Take 10 mg by mouth daily.     Yes Historical Provider, MD  tiotropium (SPIRIVA) 18 MCG inhalation capsule Place 18 mcg into inhaler and inhale daily.     Yes Historical Provider, MD  VENTOLIN HFA 108 (90 BASE) MCG/ACT inhaler Inhale 2 puffs into the lungs every 6 (six) hours as needed for wheezing or shortness of breath.  07/31/12   Historical Provider, MD     Social History:  reports that she quit smoking about 3 years ago. Her smoking use included Cigarettes. She has a 29 pack-year smoking history. She has never used smokeless tobacco. She reports that she does not drink alcohol or use illicit drugs.  Family History  Problem Relation Age of Onset  . Coronary artery disease       All the positives are listed in BOLD  Review of Systems:  HEENT: Headache, blurred vision, runny nose, sore throat Neck: Hypothyroidism, hyperthyroidism,,lymphadenopathy Chest : Shortness of breath, history of COPD, Asthma Heart : Chest pain, history of coronary arterey disease GI:  Nausea, vomiting, diarrhea, constipation, GERD GU: Dysuria, urgency, frequency of urination, hematuria Neuro: Stroke, seizures, syncope, TIA Psych: Depression, anxiety, hallucinations   Physical Exam: Blood pressure 171/79, pulse 67, temperature 98.3 F (36.8 C), temperature source Oral, resp. rate 22, SpO2 100.00%. Constitutional:   Patient is a well-developed and well-nourished female* in no acute distress and cooperative with exam. Head: Normocephalic and atraumatic Mouth: Mucus membranes moist Eyes: PERRL, EOMI, conjunctivae normal Neck: Supple, No Thyromegaly Cardiovascular: RRR, S1 normal, S2 normal Pulmonary/Chest: CTAB, no wheezes, rales, or rhonchi Abdominal: Soft. Non-tender, non-distended, bowel sounds are normal, no masses, organomegaly, or guarding present.  Neurological: A&O x3, Strenght is normal and symmetric bilaterally, cranial nerve II-XII are grossly intact, no focal motor deficit, sensory intact to light touch bilaterally.  Extremities : Swelling noted over the left knee, left thigh  tender to palpation  Labs on Admission:  Basic Metabolic Panel:  Recent Labs Lab 01/23/14 1935  NA 139  K 4.0  CL 99  CO2 26  GLUCOSE 149*  BUN 23  CREATININE 1.36*  CALCIUM 9.1   Liver Function Tests: No results found for this basename: AST, ALT, ALKPHOS,  BILITOT, PROT, ALBUMIN,  in the last 168 hours No results found for this basename: LIPASE, AMYLASE,  in the last 168 hours No results found for this basename: AMMONIA,  in the last 168 hours CBC:  Recent Labs Lab 01/23/14 1935  WBC 10.4  NEUTROABS 8.4*  HGB 10.9*  HCT 33.8*  MCV 79.0  PLT 173    Radiological Exams on Admission: Dg Chest 1 View  01/23/2014   CLINICAL DATA:  Distal femur fracture.  EXAM: CHEST - 1 VIEW  COMPARISON:  07/07/2012  FINDINGS: The heart size appears mildly enlarged. No pleural effusion or edema. There is no airspace consolidation identified. There is a calcified granuloma in the right lower lobe. Calcified atherosclerotic plaque is noted within the aortic arch.  IMPRESSION: 1. No acute cardiopulmonary abnormalities. 2. Atherosclerotic disease.   Electronically Signed   By: Signa Kell M.D.   On: 01/23/2014 19:28   Dg Pelvis 1-2 Views  01/23/2014   CLINICAL DATA:  Fall, distal femur fracture  EXAM: PELVIS -  1-2 VIEW  COMPARISON:  None.  FINDINGS: No fracture or dislocation is seen.  Mild degenerative changes of the bilateral hips.  Visualized bony pelvis appears intact.  Degenerative changes the lower lumbar spine.  Vascular calcifications.  IMPRESSION: No fracture or dislocation is seen.   Electronically Signed   By: Charline Bills M.D.   On: 01/23/2014 19:29   Dg Femur Left  01/23/2014   CLINICAL DATA:  Fracture of the distal femur.  EXAM: LEFT FEMUR - 2 VIEW  COMPARISON:  None  FINDINGS: The patient is status post left total knee arthroplasty. There is a comminuted distal metaphysis heel fracture involving the left femur. There is posterior displacement of the distal fracture fragments by 1/2 shaft's width. There is also mild lateral angulation of the distal fracture fragments.  IMPRESSION: 1. Comminuted fracture involves the distal femur.   Electronically Signed   By: Signa Kell M.D.   On: 01/23/2014 19:29   Dg Knee Complete 4 Views Left  01/23/2014    CLINICAL DATA:  Left knee pain after a fall.  EXAM: LEFT KNEE - COMPLETE 4+ VIEW  COMPARISON:  None.  FINDINGS: There is a mildly comminuted fracture of the distal femoral metadiaphysis, involving the superior margin of the left total knee arthroplasty. Apex and medial and volar angulation of the fracture fragments. Bones appear osteopenic.  IMPRESSION: Distal left femur fracture associated with a left total knee arthroplasty.   Electronically Signed   By: Leanna Battles M.D.   On: 01/23/2014 18:58    EKG: Independently reviewed. Sinus rhythm, QT prolongation   Assessment/Plan Active Problems:   Femur fracture, left   Diabetes mellitus   COPD    Bipolar disorder    History of TIA  Left femur fracture X-ray of the left knee showed distal left femur fracture associated with left total knee arthroplasty. Orthopedics has been consulted at [facility], Dr. Wyline [facility] will see the patient as consult. Will keep the patient n.p.o., continued pain control with fentanyl 25 mcg every hours when necessary  Diabetes mellitus Patient is n.p.o., we'll start sliding scale insulin. Will check CBGs every 6 hours. I will hold the metformin.  COPD Currently stable, no shortness of breath. We'll continue DuoNeb nebulizers every 6 hours when necessary for shortness of breath.  History of nonobstructive CAD Patient has a history of nonobstructive CAD, currently on beta blockers. We'll continue metoprolol for 0.5 g by mouth twice a day. I will hold the aspirin for possible surgery tonight or tomorrow morning.  Bipolar disorder Continue Seroquel, Zoloft and Xanax twice a day    DVT prophylaxis  Lovenox   Code status:Patient is DO NOT RESUSCITATE  Risk stratification- patient is a moderate to risk for surgery, due to patient's age and limited mobility. Patient has been wheelchair-bound for last 15 years.  Patient will be transferred to [facility] for surgery, Dr. Toniann Fail has accepted the  patient.  Family discussion: Admission, patients condition and plan of care including tests being ordered have been discussed with the patient ,  daughter and granddaughter at bedside* who indicate understanding and agree with the plan and Code Status.   Time Spent on Admission: 65 minutes  LAMA,GAGAN S [facility] Pager: [phone] 01/23/2014, 8:53 PM  If 7PM-7AM, please contact night-coverage  [email or web address]  Password [email or web address]

## 2014-01-23 NOTE — ED Notes (Signed)
Pt comes from home via EMS after a fall. Pt presents with deformity to left knee. Pt reports hx of knee replacement in her left knee 10-15 years ago. Pt denies hitting head, denies LOC. Pt had 10mg  morphine via EMS pta.

## 2014-01-24 ENCOUNTER — Encounter (HOSPITAL_COMMUNITY): Payer: Medicare Other | Admitting: Anesthesiology

## 2014-01-24 ENCOUNTER — Encounter (HOSPITAL_COMMUNITY)
Admission: EM | Disposition: A | Discharge status: Skilled Nursing Facility | Payer: Self-pay | Source: Home / Self Care | Attending: Internal Medicine

## 2014-01-24 ENCOUNTER — Encounter (HOSPITAL_COMMUNITY): Payer: Self-pay | Admitting: Anesthesiology

## 2014-01-24 ENCOUNTER — Inpatient Hospital Stay (HOSPITAL_COMMUNITY): Payer: Medicare Other

## 2014-01-24 ENCOUNTER — Inpatient Hospital Stay (HOSPITAL_COMMUNITY): Payer: Medicare Other | Admitting: Anesthesiology

## 2014-01-24 DIAGNOSIS — E119 Type 2 diabetes mellitus without complications: Secondary | ICD-10-CM | POA: Diagnosis present

## 2014-01-24 DIAGNOSIS — N390 Urinary tract infection, site not specified: Secondary | ICD-10-CM | POA: Diagnosis present

## 2014-01-24 HISTORY — PX: ORIF DISTAL FEMUR FRACTURE: SUR926

## 2014-01-24 HISTORY — PX: ORIF FEMUR FRACTURE: SHX2119

## 2014-01-24 LAB — CBC
HEMATOCRIT: 28.2 % — AB (ref 36.0–46.0)
HEMOGLOBIN: 8.9 g/dL — AB (ref 12.0–15.0)
MCH: 25.1 pg — ABNORMAL LOW (ref 26.0–34.0)
MCHC: 31.6 g/dL (ref 30.0–36.0)
MCV: 79.7 fL (ref 78.0–100.0)
Platelets: 150 10*3/uL (ref 150–400)
RBC: 3.54 MIL/uL — ABNORMAL LOW (ref 3.87–5.11)
RDW: 14.8 % (ref 11.5–15.5)
WBC: 10.4 10*3/uL (ref 4.0–10.5)

## 2014-01-24 LAB — COMPREHENSIVE METABOLIC PANEL
ALBUMIN: 3.2 g/dL — AB (ref 3.5–5.2)
ALT: 8 U/L (ref 0–35)
ANION GAP: 14 (ref 5–15)
AST: 12 U/L (ref 0–37)
Alkaline Phosphatase: 76 U/L (ref 39–117)
BUN: 20 mg/dL (ref 6–23)
CO2: 24 mEq/L (ref 19–32)
CREATININE: 1.29 mg/dL — AB (ref 0.50–1.10)
Calcium: 8.5 mg/dL (ref 8.4–10.5)
Chloride: 102 mEq/L (ref 96–112)
GFR calc Af Amer: 46 mL/min — ABNORMAL LOW (ref >90)
GFR calc non Af Amer: 40 mL/min — ABNORMAL LOW (ref >90)
Glucose, Bld: 123 mg/dL — ABNORMAL HIGH (ref 70–99)
Potassium: 4 mEq/L (ref 3.7–5.3)
Sodium: 140 mEq/L (ref 137–147)
TOTAL PROTEIN: 5.9 g/dL — AB (ref 6.0–8.3)
Total Bilirubin: 0.3 mg/dL (ref 0.3–1.2)

## 2014-01-24 LAB — BASIC METABOLIC PANEL
Anion gap: 12 (ref 5–15)
BUN: 19 mg/dL (ref 6–23)
CO2: 25 meq/L (ref 19–32)
CREATININE: 1.33 mg/dL — AB (ref 0.50–1.10)
Calcium: 8.5 mg/dL (ref 8.4–10.5)
Chloride: 104 mEq/L (ref 96–112)
GFR calc Af Amer: 45 mL/min — ABNORMAL LOW (ref >90)
GFR calc non Af Amer: 39 mL/min — ABNORMAL LOW (ref >90)
GLUCOSE: 100 mg/dL — AB (ref 70–99)
Potassium: 4 mEq/L (ref 3.7–5.3)
Sodium: 141 mEq/L (ref 137–147)

## 2014-01-24 LAB — GLUCOSE, CAPILLARY
GLUCOSE-CAPILLARY: 103 mg/dL — AB (ref 70–99)
Glucose-Capillary: 103 mg/dL — ABNORMAL HIGH (ref 70–99)
Glucose-Capillary: 103 mg/dL — ABNORMAL HIGH (ref 70–99)
Glucose-Capillary: 119 mg/dL — ABNORMAL HIGH (ref 70–99)
Glucose-Capillary: 119 mg/dL — ABNORMAL HIGH (ref 70–99)
Glucose-Capillary: 81 mg/dL (ref 70–99)

## 2014-01-24 LAB — PROTIME-INR
INR: 1.12 (ref 0.00–1.49)
Prothrombin Time: 14.4 seconds (ref 11.6–15.2)

## 2014-01-24 LAB — SURGICAL PCR SCREEN
MRSA, PCR: NEGATIVE
Staphylococcus aureus: POSITIVE — AB

## 2014-01-24 SURGERY — OPEN REDUCTION INTERNAL FIXATION (ORIF) DISTAL FEMUR FRACTURE
Anesthesia: General | Laterality: Left

## 2014-01-24 SURGERY — INSERTION, INTRAMEDULLARY ROD, FEMUR, RETROGRADE
Anesthesia: General

## 2014-01-24 SURGERY — OPEN REDUCTION INTERNAL FIXATION (ORIF) DISTAL FEMUR FRACTURE
Anesthesia: General

## 2014-01-24 MED ORDER — ASPIRIN EC 325 MG PO TBEC: 325.0000 mg | DELAYED_RELEASE_TABLET | Freq: Every day | ORAL | Status: DC

## 2014-01-24 MED ORDER — FENTANYL CITRATE 0.05 MG/ML IJ SOLN
INTRAMUSCULAR | Status: AC
  Filled 2014-01-24: qty 5

## 2014-01-24 MED ORDER — NEOSTIGMINE METHYLSULFATE 10 MG/10ML IV SOLN
INTRAVENOUS | Status: AC
  Filled 2014-01-24: qty 1

## 2014-01-24 MED ORDER — MENTHOL 3 MG MT LOZG
1.0000 | LOZENGE | OROMUCOSAL | Status: DC | PRN
Start: 2014-01-24 — End: 2014-01-25

## 2014-01-24 MED ORDER — METOPROLOL TARTRATE 1 MG/ML IV SOLN: 5.0000 mg | INTRAVENOUS | Status: DC | PRN

## 2014-01-24 MED ORDER — CIPROFLOXACIN IN D5W 400 MG/200ML IV SOLN
400.0000 mg | Freq: Two times a day (BID) | INTRAVENOUS | Status: DC
  Filled 2014-01-24: qty 200

## 2014-01-24 MED ORDER — HYDROMORPHONE HCL PF 1 MG/ML IJ SOLN
INTRAMUSCULAR | Status: AC
  Filled 2014-01-24: qty 1

## 2014-01-24 MED ORDER — CEFAZOLIN SODIUM-DEXTROSE 2-3 GM-% IV SOLR
2.0000 g | Freq: Four times a day (QID) | INTRAVENOUS | Status: AC
  Administered 2014-01-24 – 2014-01-25 (×2): 2 g via INTRAVENOUS
  Filled 2014-01-24 (×3): qty 50

## 2014-01-24 MED ORDER — HYDROCODONE-ACETAMINOPHEN 5-325 MG PO TABS: 1.0000 | ORAL_TABLET | ORAL | Status: DC | PRN

## 2014-01-24 MED ORDER — DOCUSATE SODIUM 100 MG PO CAPS: 100.0000 mg | ORAL_CAPSULE | Freq: Two times a day (BID) | ORAL | Status: DC

## 2014-01-24 MED ORDER — HYDROMORPHONE HCL PF 1 MG/ML IJ SOLN
0.5000 mg | INTRAMUSCULAR | Status: DC | PRN
  Administered 2014-01-24 (×2): 0.5 mg via INTRAVENOUS

## 2014-01-24 MED ORDER — ACETAMINOPHEN 650 MG RE SUPP: 650.0000 mg | Freq: Four times a day (QID) | RECTAL | Status: DC | PRN

## 2014-01-24 MED ORDER — ONDANSETRON HCL 4 MG/2ML IJ SOLN
INTRAMUSCULAR | Status: DC | PRN
  Administered 2014-01-24: 4 mg via INTRAVENOUS

## 2014-01-24 MED ORDER — PROPOFOL 10 MG/ML IV BOLUS
INTRAVENOUS | Status: AC
  Filled 2014-01-24: qty 20

## 2014-01-24 MED ORDER — LACTATED RINGERS IV SOLN
INTRAVENOUS | Status: DC | PRN
  Administered 2014-01-24 (×2): via INTRAVENOUS

## 2014-01-24 MED ORDER — MORPHINE SULFATE 2 MG/ML IJ SOLN
0.5000 mg | INTRAMUSCULAR | Status: DC | PRN
  Administered 2014-01-24: 0.5 mg via INTRAVENOUS
  Filled 2014-01-24: qty 1

## 2014-01-24 MED ORDER — DEXTROSE 5 % IV SOLN
1.0000 g | INTRAVENOUS | Status: DC
  Administered 2014-01-24: 1 g via INTRAVENOUS
  Filled 2014-01-24 (×2): qty 10

## 2014-01-24 MED ORDER — EPHEDRINE SULFATE 50 MG/ML IJ SOLN
INTRAMUSCULAR | Status: DC | PRN
  Administered 2014-01-24 (×3): 5 mg via INTRAVENOUS

## 2014-01-24 MED ORDER — MIDAZOLAM HCL 2 MG/2ML IJ SOLN
INTRAMUSCULAR | Status: AC
  Filled 2014-01-24: qty 2

## 2014-01-24 MED ORDER — HYDROCODONE-ACETAMINOPHEN 5-325 MG PO TABS: 1.0000 | ORAL_TABLET | Freq: Four times a day (QID) | ORAL | Status: DC | PRN

## 2014-01-24 MED ORDER — ALPRAZOLAM 0.25 MG PO TABS
0.2500 mg | ORAL_TABLET | Freq: Every day | ORAL | Status: DC
  Administered 2014-01-24 – 2014-01-27 (×4): 0.25 mg via ORAL
  Filled 2014-01-24 (×4): qty 1

## 2014-01-24 MED ORDER — GLYCOPYRROLATE 0.2 MG/ML IJ SOLN
INTRAMUSCULAR | Status: AC
  Filled 2014-01-24: qty 2

## 2014-01-24 MED ORDER — FENTANYL CITRATE 0.05 MG/ML IJ SOLN
INTRAMUSCULAR | Status: AC
Start: 2014-01-24 — End: 2014-01-24
  Administered 2014-01-24: 50 ug via INTRAVENOUS
  Filled 2014-01-24: qty 2

## 2014-01-24 MED ORDER — BUPIVACAINE HCL (PF) 0.25 % IJ SOLN
INTRAMUSCULAR | Status: DC | PRN
  Administered 2014-01-24: 20 mL

## 2014-01-24 MED ORDER — ONDANSETRON HCL 4 MG/2ML IJ SOLN
INTRAMUSCULAR | Status: AC
  Filled 2014-01-24: qty 2

## 2014-01-24 MED ORDER — ROPIVACAINE HCL 5 MG/ML IJ SOLN
INTRAMUSCULAR | Status: DC | PRN
  Administered 2014-01-24: 30 mL via PERINEURAL

## 2014-01-24 MED ORDER — LIDOCAINE HCL 2 % EX GEL
CUTANEOUS | Status: AC
  Filled 2014-01-24: qty 20

## 2014-01-24 MED ORDER — ACETAMINOPHEN 325 MG PO TABS
650.0000 mg | ORAL_TABLET | Freq: Four times a day (QID) | ORAL | Status: DC | PRN
  Administered 2014-01-25: 650 mg via ORAL
  Filled 2014-01-24: qty 2

## 2014-01-24 MED ORDER — ROCURONIUM BROMIDE 50 MG/5ML IV SOLN
INTRAVENOUS | Status: AC
  Filled 2014-01-24: qty 1

## 2014-01-24 MED ORDER — ROCURONIUM BROMIDE 100 MG/10ML IV SOLN
INTRAVENOUS | Status: DC | PRN
  Administered 2014-01-24: 50 mg via INTRAVENOUS

## 2014-01-24 MED ORDER — ARTIFICIAL TEARS OP OINT
TOPICAL_OINTMENT | OPHTHALMIC | Status: AC
  Filled 2014-01-24: qty 3.5

## 2014-01-24 MED ORDER — BUPIVACAINE HCL (PF) 0.25 % IJ SOLN
INTRAMUSCULAR | Status: AC
  Filled 2014-01-24: qty 30

## 2014-01-24 MED ORDER — NEOSTIGMINE METHYLSULFATE 10 MG/10ML IV SOLN
INTRAVENOUS | Status: DC | PRN
  Administered 2014-01-24: 4 mg via INTRAVENOUS

## 2014-01-24 MED ORDER — PHENOL 1.4 % MT LIQD: 1.0000 | OROMUCOSAL | Status: DC | PRN

## 2014-01-24 MED ORDER — LIDOCAINE HCL (CARDIAC) 20 MG/ML IV SOLN
INTRAVENOUS | Status: DC | PRN
  Administered 2014-01-24: 40 mg via INTRAVENOUS

## 2014-01-24 MED ORDER — CEFAZOLIN SODIUM-DEXTROSE 2-3 GM-% IV SOLR
INTRAVENOUS | Status: DC | PRN
  Administered 2014-01-24: 2 g via INTRAVENOUS

## 2014-01-24 MED ORDER — SODIUM CHLORIDE 0.9 % IV SOLN
INTRAVENOUS | Status: AC
  Administered 2014-01-24: 10:00:00 via INTRAVENOUS

## 2014-01-24 MED ORDER — PROPOFOL 10 MG/ML IV BOLUS
INTRAVENOUS | Status: DC | PRN
  Administered 2014-01-24: 70 mg via INTRAVENOUS

## 2014-01-24 MED ORDER — GLYCOPYRROLATE 0.2 MG/ML IJ SOLN
INTRAMUSCULAR | Status: DC | PRN
  Administered 2014-01-24: .8 mg via INTRAVENOUS

## 2014-01-24 MED ORDER — ASPIRIN 81 MG PO CHEW
81.0000 mg | CHEWABLE_TABLET | Freq: Every day | ORAL | Status: DC
  Administered 2014-01-25 – 2014-01-26 (×2): 81 mg via ORAL
  Filled 2014-01-24 (×3): qty 1

## 2014-01-24 SURGICAL SUPPLY — 40 items
BANDAGE ELASTIC 4 VELCRO ST LF (GAUZE/BANDAGES/DRESSINGS) ×4 IMPLANT
BANDAGE ELASTIC 6 VELCRO ST LF (GAUZE/BANDAGES/DRESSINGS) ×4 IMPLANT
BANDAGE GAUZE ELAST BULKY 4 IN (GAUZE/BANDAGES/DRESSINGS) ×4 IMPLANT
BLADE SURG ROTATE 9660 (MISCELLANEOUS) IMPLANT
DRAPE C-ARM 42X72 X-RAY (DRAPES) ×4 IMPLANT
DRAPE C-ARMOR (DRAPES) ×4 IMPLANT
DRAPE ORTHO SPLIT 77X108 STRL (DRAPES) ×6
DRAPE SURG ORHT 6 SPLT 77X108 (DRAPES) ×4 IMPLANT
DRAPE U-SHAPE 47X51 STRL (DRAPES) ×4 IMPLANT
DRSG ADAPTIC 3X8 NADH LF (GAUZE/BANDAGES/DRESSINGS) ×4 IMPLANT
DRSG PAD ABDOMINAL 8X10 ST (GAUZE/BANDAGES/DRESSINGS) ×16 IMPLANT
ELECT REM PT RETURN 9FT ADLT (ELECTROSURGICAL) ×3
ELECTRODE REM PT RTRN 9FT ADLT (ELECTROSURGICAL) ×2 IMPLANT
GLOVE BIO SURGEON STRL SZ8 (GLOVE) ×4 IMPLANT
GLOVE BIOGEL PI IND STRL 7.5 (GLOVE) ×2 IMPLANT
GLOVE BIOGEL PI INDICATOR 7.5 (GLOVE) ×2
GOWN STRL REUS W/ TWL LRG LVL3 (GOWN DISPOSABLE) ×4 IMPLANT
GOWN STRL REUS W/ TWL XL LVL3 (GOWN DISPOSABLE) ×2 IMPLANT
GOWN STRL REUS W/TWL LRG LVL3 (GOWN DISPOSABLE) ×6
GOWN STRL REUS W/TWL XL LVL3 (GOWN DISPOSABLE) ×3
KIT BASIN OR (CUSTOM PROCEDURE TRAY) ×4 IMPLANT
KIT ROOM TURNOVER OR (KITS) ×4 IMPLANT
MANIFOLD NEPTUNE II (INSTRUMENTS) ×4 IMPLANT
NS IRRIG 1000ML POUR BTL (IV SOLUTION) ×4 IMPLANT
PACK TOTAL JOINT (CUSTOM PROCEDURE TRAY) ×4 IMPLANT
PAD ARMBOARD 7.5X6 YLW CONV (MISCELLANEOUS) ×8 IMPLANT
PAD CAST 4YDX4 CTTN HI CHSV (CAST SUPPLIES) ×2 IMPLANT
PADDING CAST COTTON 4X4 STRL (CAST SUPPLIES) ×3
PADDING CAST COTTON 6X4 STRL (CAST SUPPLIES) ×4 IMPLANT
SPONGE GAUZE 4X4 12PLY (GAUZE/BANDAGES/DRESSINGS) ×4 IMPLANT
STAPLER VISISTAT 35W (STAPLE) IMPLANT
SUT MNCRL AB 4-0 PS2 18 (SUTURE) ×4 IMPLANT
SUT MON AB 2-0 CT1 27 (SUTURE) ×4 IMPLANT
SUT VIC AB 0 CT1 27 (SUTURE) ×6
SUT VIC AB 0 CT1 27XBRD ANBCTR (SUTURE) ×4 IMPLANT
TOWEL OR 17X24 6PK STRL BLUE (TOWEL DISPOSABLE) ×4 IMPLANT
TOWEL OR 17X26 10 PK STRL BLUE (TOWEL DISPOSABLE) ×8 IMPLANT
TOWEL OR NON WOVEN STRL DISP B (DISPOSABLE) ×4 IMPLANT
TRAY FOLEY CATH 16FRSI W/METER (SET/KITS/TRAYS/PACK) IMPLANT
WATER STERILE IRR 1000ML POUR (IV SOLUTION) ×8 IMPLANT

## 2014-01-24 SURGICAL SUPPLY — 43 items
BANDAGE ELASTIC 4 VELCRO ST LF (GAUZE/BANDAGES/DRESSINGS) ×4 IMPLANT
BANDAGE ELASTIC 6 VELCRO ST LF (GAUZE/BANDAGES/DRESSINGS) ×4 IMPLANT
BANDAGE ESMARK 6X9 LF (GAUZE/BANDAGES/DRESSINGS) IMPLANT
BLADE SURG ROTATE 9660 (MISCELLANEOUS) IMPLANT
BNDG CMPR 9X6 STRL LF SNTH (GAUZE/BANDAGES/DRESSINGS)
BNDG COHESIVE 6X5 TAN STRL LF (GAUZE/BANDAGES/DRESSINGS) ×4 IMPLANT
BNDG ESMARK 6X9 LF (GAUZE/BANDAGES/DRESSINGS)
COVER SURGICAL LIGHT HANDLE (MISCELLANEOUS) ×8 IMPLANT
CUFF TOURNIQUET SINGLE 34IN LL (TOURNIQUET CUFF) IMPLANT
DRAPE C-ARM 42X72 X-RAY (DRAPES) ×4 IMPLANT
DRAPE ORTHO SPLIT 77X108 STRL (DRAPES) ×6
DRAPE PROXIMA HALF (DRAPES) ×8 IMPLANT
DRAPE SURG ORHT 6 SPLT 77X108 (DRAPES) ×4 IMPLANT
DRAPE U-SHAPE 47X51 STRL (DRAPES) ×4 IMPLANT
DRSG MEPILEX BORDER 4X4 (GAUZE/BANDAGES/DRESSINGS) ×4 IMPLANT
DRSG MEPILEX BORDER 4X8 (GAUZE/BANDAGES/DRESSINGS) ×4 IMPLANT
DURAPREP 26ML APPLICATOR (WOUND CARE) ×4 IMPLANT
ELECT REM PT RETURN 9FT ADLT (ELECTROSURGICAL) ×3
ELECTRODE REM PT RTRN 9FT ADLT (ELECTROSURGICAL) ×2 IMPLANT
GLOVE BIO SURGEON STRL SZ7.5 (GLOVE) ×8 IMPLANT
GLOVE BIOGEL PI IND STRL 8 (GLOVE) ×2 IMPLANT
GLOVE BIOGEL PI INDICATOR 8 (GLOVE) ×2
GOWN STRL REUS W/ TWL LRG LVL3 (GOWN DISPOSABLE) ×4 IMPLANT
GOWN STRL REUS W/ TWL XL LVL3 (GOWN DISPOSABLE) ×2 IMPLANT
GOWN STRL REUS W/TWL LRG LVL3 (GOWN DISPOSABLE) ×6
GOWN STRL REUS W/TWL XL LVL3 (GOWN DISPOSABLE) ×3
KIT BASIN OR (CUSTOM PROCEDURE TRAY) ×4 IMPLANT
KIT ROOM TURNOVER OR (KITS) ×4 IMPLANT
MANIFOLD NEPTUNE II (INSTRUMENTS) ×4 IMPLANT
NEEDLE 22X1 1/2 (OR ONLY) (NEEDLE) ×4 IMPLANT
NS IRRIG 1000ML POUR BTL (IV SOLUTION) ×4 IMPLANT
PACK GENERAL/GYN (CUSTOM PROCEDURE TRAY) ×4 IMPLANT
PAD ARMBOARD 7.5X6 YLW CONV (MISCELLANEOUS) ×8 IMPLANT
STOCKINETTE IMPERVIOUS LG (DRAPES) ×4 IMPLANT
SUT MNCRL AB 4-0 PS2 18 (SUTURE) ×4 IMPLANT
SUT MON AB 2-0 CT1 27 (SUTURE) ×4 IMPLANT
SUT VIC AB 0 CT1 27 (SUTURE) ×3
SUT VIC AB 0 CT1 27XBRD ANBCTR (SUTURE) ×2 IMPLANT
SYR CONTROL 10ML LL (SYRINGE) ×4 IMPLANT
TOWEL OR 17X24 6PK STRL BLUE (TOWEL DISPOSABLE) ×4 IMPLANT
TOWEL OR 17X26 10 PK STRL BLUE (TOWEL DISPOSABLE) ×4 IMPLANT
TOWEL OR NON WOVEN STRL DISP B (DISPOSABLE) ×4 IMPLANT
WATER STERILE IRR 1000ML POUR (IV SOLUTION) ×4 IMPLANT

## 2014-01-24 SURGICAL SUPPLY — 62 items
BANDAGE ELASTIC 4 VELCRO ST LF (GAUZE/BANDAGES/DRESSINGS) ×3 IMPLANT
BANDAGE ELASTIC 6 VELCRO ST LF (GAUZE/BANDAGES/DRESSINGS) ×3 IMPLANT
BANDAGE GAUZE ELAST BULKY 4 IN (GAUZE/BANDAGES/DRESSINGS) ×3 IMPLANT
BIT DRILL 4.3X289 (BIT) IMPLANT
BIT DRILL AO MATTA 3.2MX230M (BIT) IMPLANT
BLADE SURG ROTATE 9660 (MISCELLANEOUS) IMPLANT
BNDG ADH 5X4 AIR PERM ELC (GAUZE/BANDAGES/DRESSINGS) ×1
BNDG COHESIVE 4X5 WHT NS (GAUZE/BANDAGES/DRESSINGS) ×2 IMPLANT
CLOSURE STERI-STRIP 1/2X4 (GAUZE/BANDAGES/DRESSINGS) ×1
CLSR STERI-STRIP ANTIMIC 1/2X4 (GAUZE/BANDAGES/DRESSINGS) ×1 IMPLANT
DRAPE C-ARM 42X72 X-RAY (DRAPES) ×3 IMPLANT
DRAPE C-ARMOR (DRAPES) ×3 IMPLANT
DRAPE IMP U-DRAPE 54X76 (DRAPES) ×2 IMPLANT
DRAPE ORTHO SPLIT 77X108 STRL (DRAPES) ×6
DRAPE SURG ORHT 6 SPLT 77X108 (DRAPES) ×2 IMPLANT
DRAPE U-SHAPE 47X51 STRL (DRAPES) ×3 IMPLANT
DRILL BIT 4.3X289 (BIT) ×3
DRILL BIT AO MATTA 3.2MX230M (BIT) ×3
DRSG ADAPTIC 3X8 NADH LF (GAUZE/BANDAGES/DRESSINGS) ×3 IMPLANT
DRSG PAD ABDOMINAL 8X10 ST (GAUZE/BANDAGES/DRESSINGS) ×12 IMPLANT
ELECT REM PT RETURN 9FT ADLT (ELECTROSURGICAL) ×3
ELECTRODE REM PT RTRN 9FT ADLT (ELECTROSURGICAL) ×1 IMPLANT
GLOVE BIO SURGEON STRL SZ8 (GLOVE) ×3 IMPLANT
GLOVE BIOGEL PI IND STRL 7.5 (GLOVE) ×1 IMPLANT
GLOVE BIOGEL PI INDICATOR 7.5 (GLOVE) ×2
GOWN STRL REUS W/ TWL LRG LVL3 (GOWN DISPOSABLE) ×2 IMPLANT
GOWN STRL REUS W/ TWL XL LVL3 (GOWN DISPOSABLE) ×1 IMPLANT
GOWN STRL REUS W/TWL LRG LVL3 (GOWN DISPOSABLE) ×6
GOWN STRL REUS W/TWL XL LVL3 (GOWN DISPOSABLE) ×3
KIT BASIN OR (CUSTOM PROCEDURE TRAY) ×3 IMPLANT
KIT ROOM TURNOVER OR (KITS) ×3 IMPLANT
MANIFOLD NEPTUNE II (INSTRUMENTS) ×3 IMPLANT
NEEDLE 22X1 1/2 (OR ONLY) (NEEDLE) ×2 IMPLANT
NS IRRIG 1000ML POUR BTL (IV SOLUTION) ×3 IMPLANT
PACK TOTAL JOINT (CUSTOM PROCEDURE TRAY) ×3 IMPLANT
PAD ABD 8X10 STRL (GAUZE/BANDAGES/DRESSINGS) ×2 IMPLANT
PAD ARMBOARD 7.5X6 YLW CONV (MISCELLANEOUS) ×6 IMPLANT
PAD CAST 4YDX4 CTTN HI CHSV (CAST SUPPLIES) ×1 IMPLANT
PADDING CAST COTTON 4X4 STRL (CAST SUPPLIES) ×3
PADDING CAST COTTON 6X4 STRL (CAST SUPPLIES) ×3 IMPLANT
PLATE DISTAL FEM 6H 166M (Plate) ×2 IMPLANT
SCREW CANC THRD 6.5X80MM (Screw) ×4 IMPLANT
SCREW CORTEX ST MATTA 4.5X42MM (Screw) ×2 IMPLANT
SCREW CORTEX ST MATTA 4.5X44MM (Screw) ×2 IMPLANT
SCREW CORTEX ST MATTA 4.5X46MM (Screw) ×2 IMPLANT
SCREW LOCKING 36X5.0MM (Screw) ×2 IMPLANT
SCREW LOCKING 50MM (Screw) ×2 IMPLANT
SCREW LOCKING 80X5.0MM (Screw) ×2 IMPLANT
SPONGE GAUZE 4X4 12PLY (GAUZE/BANDAGES/DRESSINGS) ×3 IMPLANT
SPONGE GAUZE 4X4 12PLY STER LF (GAUZE/BANDAGES/DRESSINGS) ×2 IMPLANT
STAPLER VISISTAT 35W (STAPLE) IMPLANT
SUT MNCRL AB 4-0 PS2 18 (SUTURE) ×5 IMPLANT
SUT MON AB 2-0 CT1 27 (SUTURE) ×3 IMPLANT
SUT MON AB 2-0 CT1 36 (SUTURE) ×2 IMPLANT
SUT VIC AB 0 CT1 27 (SUTURE) ×12
SUT VIC AB 0 CT1 27XBRD ANBCTR (SUTURE) ×2 IMPLANT
SYR CONTROL 10ML LL (SYRINGE) ×2 IMPLANT
TOWEL OR 17X24 6PK STRL BLUE (TOWEL DISPOSABLE) ×3 IMPLANT
TOWEL OR 17X26 10 PK STRL BLUE (TOWEL DISPOSABLE) ×6 IMPLANT
TOWEL OR NON WOVEN STRL DISP B (DISPOSABLE) ×3 IMPLANT
TRAY FOLEY CATH 16FRSI W/METER (SET/KITS/TRAYS/PACK) IMPLANT
WATER STERILE IRR 1000ML POUR (IV SOLUTION) ×6 IMPLANT

## 2014-01-24 NOTE — Progress Notes (Signed)
Orthopedic Tech Progress Note Patient Details:  Brenda Mcdonald [birth year] [id number]  Ortho Devices Type of Ortho Device: Knee Immobilizer   Haskell Flirt 01/24/2014, 12:28 AM

## 2014-01-24 NOTE — Op Note (Signed)
01/23/2014 - 01/24/2014  2:34 PM  PATIENT:  [name]    PRE-OPERATIVE DIAGNOSIS:  left femur fracture  POST-OPERATIVE DIAGNOSIS:  Same  PROCEDURE:  OPEN REDUCTION INTERNAL FIXATION (ORIF) DISTAL FEMUR FRACTURE  SURGEON:  Margarita Rana, D, MD  ASSISTANT: Janace Litten OPA  ANESTHESIA:   gen  PREOPERATIVE INDICATIONS:  [name] is a  73 y.o. female with a diagnosis of left femur fracture who failed conservative measures and elected for surgical management.    The risks benefits and alternatives were discussed with the patient preoperatively including but not limited to the risks of infection, bleeding, nerve injury, cardiopulmonary complications, the need for revision surgery, among others, and the patient was willing to proceed.  OPERATIVE IMPLANTS: [employer]ryker distal femur locking plate  OPERATIVE FINDINGS: supracondylar fracture  BLOOD LOSS: 50  COMPLICATIONS: none  TOURNIQUET TIME: none  OPERATIVE PROCEDURE:  Patient was identified in the preoperative holding area and site was marked by me She was transported to the operating theater and placed on the table in supine position taking care to pad all bony prominences. After a preincinduction time out anesthesia was induced. The left lower extremity was prepped and draped in normal sterile fashion and a pre-incision timeout was performed. She received ancef for preoperative antibiotics.   I made a lateral leg incision from the joint line superiorly of roughly 10 cm. I carried my dissection down sharply to the IT band which was split in line with the incision. I then used a Cobb to elevate the muscle off of the lateral femur and the fracture was identified immediately.  I then debrided the fracture site and a performed a reduction maneuver I selected the appropriate length locking plate for the distal femur and pinned this into place with a K wire holding the fracture reduced as well.  I then placed a combination of locking  and nonlocking screws to help with fracture reduction and stabilization in the proximal aspect of the plate as well as the distal aspect of the plate. I uses many distal locking screws as possible. Care was taken to not penetrate the distal end of the bone.   Had good bites of my locking screws proximal bicortical screws had good bites the bone was soft distally.  I then took multiple fluoroscopic views and was happy with the reduction and placement of all hardware. I then thoroughly irrigated the wound and closed the IT band followed by the skin in layers with absorbable stitch.  POST OPERATIVE PLAN: Knee immobilizer full-time, nonweightbearing. dvt px: scd's/TED's, and ASA 325

## 2014-01-24 NOTE — Progress Notes (Addendum)
Patient Demographics  Brenda Mcdonald, is a 73 y.o. female, DOB - 04-16-1941, ZOX:[id number]  Admit date - 01/23/2014   Admitting Physician Meredeth IdeGagan S Lama, MD  Outpatient Primary MD for the patient is VYAS,DHRUV B., MD  LOS - 1   Chief Complaint  Patient presents with  . Knee Injury  . Fall       Subjective:   Brenda BosBarbara Kretzschmar today has, No headache, No chest pain, No abdominal pain - No Nausea, No new weakness tingling or numbness, No Cough - SOB. Mild dull left leg pain.    Assessment & Plan    Left femur fracture  After mechanical fall at home, she is wheelchair-bound at baseline. Currently awaiting orthopedics to evaluate him for surgery later this afternoon.  Cardio-Pulm Risk stratification for surgery and recommendations to minimize the same:-  A.Cardio-Pulmonary Risk -  this patient is a moderate to high risk for adverse Cardio-Pulmonary  Outcome  from surgery, the risks and benefits were discussed and acceptable to patient, son and daughter.  Recommendations for optimizing Cardio-Pulmonary  Risk risk factors  1. Keep SBP<140, HR<85, use Lopressor 5mg  IV q4hrs PRN, or B.Blocker drip PRN. 2. Moniotr I&Os. 3. Minimal sedation and Narcotics. 4. Good pulmunary toilet. 5. PRN Nebs and as needed oxygen to keep Pox>90% 6. Hb>8, transfuse as needed- Lasix 10mg  IV after each unit PRBC Transfused.   B.Bleeding Risk - no previous surgical complications, no easy bruising,  Antiplate meds he 1 mg of aspirin preop   Lab Results  Component Value Date   PLT 150 01/24/2014                  No results found for this basename: INR, PROTIME      Will request Surgeon to please Order Lovenox/DVT prophylaxis of his/her choice when OK from Surgeon's standpoint post op.     Diabetes mellitus    Patient is n.p.o., we'll start sliding scale insulin. Will check CBGs every 6 hours.  CBG (last 3)   Recent Labs  01/24/14 0041 01/24/14 0632  GLUCAP 119* 103*        COPD  Currently stable, no shortness of breath. We'll continue DuoNeb nebulizers every 6 hours when necessary for shortness of breath.      History of nonobstructive CAD with old left bundle branch block, EF in 20 1255-60%. With moderate aortic valve and mitral valve regurgitation  Patient has a history of nonobstructive CAD, currently on beta blockers. We'll continue metoprolol for 0.5 g by mouth twice a day. I will hold the aspirin for possible surgery today and resume from tomorrow. As needed IV Lopressor ordered. Symptom-free from this standpoint.     Bipolar disorder with mild dementia.  Continue Seroquel, Zoloft and Xanax twice a day. Is at risk for delirium. Family explained and educated.     UTI.  Placed on 3 days of Rocephin, follow cultures.        Code Status: DO NOT RESUSCITATE  Family Communication: Son and daughter bedside  Disposition Plan: SNF   Procedures likely for left femoral open reduction internal fixation by Dr. Margarita Ranaimothy Murphy on 01-2014   Consults  Ortho   Medications  Scheduled Meds: . atorvastatin  10 mg Oral QHS  .  cefTRIAXone (ROCEPHIN)  IV  1 g Intravenous Q24H  . darifenacin  7.5 mg Oral Daily  . enoxaparin (LOVENOX) injection  40 mg Subcutaneous Daily  . insulin aspart  0-9 Units Subcutaneous TID WC  . lamoTRIgine  200 mg Oral Daily  . lisinopril  20 mg Oral Daily  . metoprolol tartrate  12.5 mg Oral BID  . pantoprazole  40 mg Oral Daily  . QUEtiapine  25 mg Oral QHS  . sertraline  100 mg Oral BID   Continuous Infusions: . sodium chloride     PRN Meds:.albuterol, HYDROcodone-acetaminophen, ipratropium-albuterol, methocarbamol (ROBAXIN) IV, methocarbamol, metoprolol  DVT Prophylaxis  Lovenox    Lab Results  Component Value Date   PLT 150  01/24/2014    Antibiotics     Anti-infectives   Start     Dose/Rate Route Frequency Ordered Stop   01/24/14 0830  ciprofloxacin (CIPRO) IVPB 400 mg  Status:  Discontinued     400 mg 200 mL/hr over 60 Minutes Intravenous 2 times daily 01/24/14 0727 01/24/14 0745   01/24/14 0800  cefTRIAXone (ROCEPHIN) 1 g in dextrose 5 % 50 mL IVPB     1 g 100 mL/hr over 30 Minutes Intravenous Every 24 hours 01/24/14 0745 01/27/14 0759          Objective:   Filed Vitals:   01/23/14 2143 01/23/14 2258 01/24/14 0555 01/24/14 0700  BP: 142/62 165/69 114/90   Pulse: 66 64 63   Temp: 98.3 F (36.8 C) 97.5 F (36.4 C) 98 F (36.7 C)   TempSrc:  Oral Oral   Resp: 18 18 18    Height:    5' 6.14" (1.68 m)  Weight:    86.7 kg (191 lb 2.2 oz)  SpO2: 96% 99% 99%     Wt Readings from Last 3 Encounters:  01/24/14 86.7 kg (191 lb 2.2 oz)  01/24/14 86.7 kg (191 lb 2.2 oz)  01/24/14 86.7 kg (191 lb 2.2 oz)     Intake/Output Summary (Last 24 hours) at 01/24/14 0930 Last data filed at 01/24/14 0555  Gross per 24 hour  Intake      0 ml  Output    150 ml  Net   -150 ml     Physical Exam  Awake Alert, Oriented X 2, No new F.N deficits, Normal affect [place].AT,PERRAL Supple Neck,No JVD, No cervical lymphadenopathy appriciated.  Symmetrical Chest wall movement, Good air movement bilaterally, CTAB RRR,No Gallops,Rubs or new Murmurs, No Parasternal Heave +ve B.Sounds, Abd Soft, No tenderness, No organomegaly appriciated, No rebound - guarding or rigidity. No Cyanosis, Clubbing or edema, No new Rash or bruise, L Knee in immobilizer   Data Review   Micro Results No results found for this or any previous visit (from the past 240 hour(s)).  Radiology Reports Dg Chest 1 View  01/23/2014   CLINICAL DATA:  Distal femur fracture.  EXAM: CHEST - 1 VIEW  COMPARISON:  07/07/2012  FINDINGS: The heart size appears mildly enlarged. No pleural effusion or edema. There is no airspace consolidation identified.  There is a calcified granuloma in the right lower lobe. Calcified atherosclerotic plaque is noted within the aortic arch.  IMPRESSION: 1. No acute cardiopulmonary abnormalities. 2. Atherosclerotic disease.   Electronically Signed   By: 07/09/2012 M.D.   On: 01/23/2014 19:28   Dg Pelvis 1-2 Views  01/23/2014   CLINICAL DATA:  Fall, distal femur fracture  EXAM: PELVIS - 1-2 VIEW  COMPARISON:  None.  FINDINGS: No fracture  or dislocation is seen.  Mild degenerative changes of the bilateral hips.  Visualized bony pelvis appears intact.  Degenerative changes the lower lumbar spine.  Vascular calcifications.  IMPRESSION: No fracture or dislocation is seen.   Electronically Signed   By: Charline Bills M.D.   On: 01/23/2014 19:29   Dg Femur Left  01/23/2014   CLINICAL DATA:  Fracture of the distal femur.  EXAM: LEFT FEMUR - 2 VIEW  COMPARISON:  None  FINDINGS: The patient is status post left total knee arthroplasty. There is a comminuted distal metaphysis heel fracture involving the left femur. There is posterior displacement of the distal fracture fragments by 1/2 shaft's width. There is also mild lateral angulation of the distal fracture fragments.  IMPRESSION: 1. Comminuted fracture involves the distal femur.   Electronically Signed   By: Signa Kell M.D.   On: 01/23/2014 19:29   Dg Knee Complete 4 Views Left  01/23/2014   CLINICAL DATA:  Left knee pain after a fall.  EXAM: LEFT KNEE - COMPLETE 4+ VIEW  COMPARISON:  None.  FINDINGS: There is a mildly comminuted fracture of the distal femoral metadiaphysis, involving the superior margin of the left total knee arthroplasty. Apex and medial and volar angulation of the fracture fragments. Bones appear osteopenic.  IMPRESSION: Distal left femur fracture associated with a left total knee arthroplasty.   Electronically Signed   By: Leanna Battles M.D.   On: 01/23/2014 18:58    CBC  Recent Labs Lab 01/23/14 1935 01/24/14 0400  WBC 10.4 10.4  HGB 10.9*  8.9*  HCT 33.8* 28.2*  PLT 173 150  MCV 79.0 79.7  MCH 25.5* 25.1*  MCHC 32.2 31.6  RDW 14.7 14.8  LYMPHSABS 1.3  --   MONOABS 0.7  --   EOSABS 0.1  --   BASOSABS 0.0  --     Chemistries   Recent Labs Lab 01/23/14 1935 01/24/14 0400  NA 139 140  K 4.0 4.0  CL 99 102  CO2 26 24  GLUCOSE 149* 123*  BUN 23 20  CREATININE 1.36* 1.29*  CALCIUM 9.1 8.5  AST  --  12  ALT  --  8  ALKPHOS  --  76  BILITOT  --  0.3   ------------------------------------------------------------------------------------------------------------------ estimated creatinine clearance is 43.2 ml/min (by C-G formula based on Cr of 1.29). ------------------------------------------------------------------------------------------------------------------ No results found for this basename: HGBA1C,  in the last 72 hours ------------------------------------------------------------------------------------------------------------------ No results found for this basename: CHOL, HDL, LDLCALC, TRIG, CHOLHDL, LDLDIRECT,  in the last 72 hours ------------------------------------------------------------------------------------------------------------------ No results found for this basename: TSH, T4TOTAL, FREET3, T3FREE, THYROIDAB,  in the last 72 hours ------------------------------------------------------------------------------------------------------------------ No results found for this basename: VITAMINB12, FOLATE, FERRITIN, TIBC, IRON, RETICCTPCT,  in the last 72 hours  Coagulation profile No results found for this basename: INR, PROTIME,  in the last 168 hours  No results found for this basename: DDIMER,  in the last 72 hours  Cardiac Enzymes No results found for this basename: CK, CKMB, TROPONINI, MYOGLOBIN,  in the last 168 hours ------------------------------------------------------------------------------------------------------------------ No components found with this basename: POCBNP,      Time  Spent in minutes  35   SINGH,PRASHANT K M.D on 01/24/2014 at 9:30 AM  Between 7am to 7pm - Pager - [phone]  After 7pm go to [email or web address] - password [email or web address]  And look for the night coverage person covering for me after hours  [facility] Office  [phone]   **Disclaimer: This note may have  been dictated with voice recognition software. Similar sounding words can inadvertently be transcribed and this note may contain transcription errors which may not have been corrected upon publication of note.**

## 2014-01-24 NOTE — Transfer of Care (Signed)
Immediate Anesthesia Transfer of Care Note  Patient: Brenda Mcdonald  Procedure(s) Performed: Procedure(s): OPEN REDUCTION INTERNAL FIXATION (ORIF) DISTAL FEMUR FRACTURE (Left)  Patient Location: PACU  Anesthesia Type:General  Level of Consciousness: awake, alert  and oriented  Airway & Oxygen Therapy: Patient Spontanous Breathing  Post-op Assessment: Report given to PACU RN  Post vital signs: Reviewed and stable  Complications: No apparent anesthesia complications

## 2014-01-24 NOTE — H&P (View-Only) (Signed)
Reason for Consult:left femur fracture Referring Physician: Hospitalist  Brenda Mcdonald is an 73 y.o. female.  HPI: This patient has a 15 year history of limited ambulation and being primarily wheelchair bound since a total knee replacement on her left knee.  She was getting up from her recliner yesterday and fell hyperflexing her leg.  She felt a pop.  Past Medical History  Diagnosis Date  . COPD (chronic obstructive pulmonary disease)   . Bipolar disorder   . TIA (transient ischemic attack)   . Dysphagia   . Essential hypertension, benign   . Diabetes mellitus, type 2   . Coronary atherosclerosis of native coronary artery     Nonobstructive  . Pulmonary nodule July 2009    Nonspecific 5 mm right middle lobe    Past Surgical History  Procedure Laterality Date  . Appendectomy    . Cholecystectomy    . Vesicovaginal fistula closure w/ tah    . Total knee arthroplasty    . Knee arthroscopy      Family History  Problem Relation Age of Onset  . Coronary artery disease      Social History:  reports that she quit smoking about 3 years ago. Her smoking use included Cigarettes. She has a 29 pack-year smoking history. She has never used smokeless tobacco. She reports that she does not drink alcohol or use illicit drugs.  Allergies:  Allergies  Allergen Reactions  . Azithromycin   . Penicillins   . Tetracycline     Medications: I have reviewed the patient's current medications. Scheduled Meds: . atorvastatin  10 mg Oral QHS  . cefTRIAXone (ROCEPHIN)  IV  1 g Intravenous Q24H  . darifenacin  7.5 mg Oral Daily  . enoxaparin (LOVENOX) injection  40 mg Subcutaneous Daily  . insulin aspart  0-9 Units Subcutaneous TID WC  . lamoTRIgine  200 mg Oral Daily  . lisinopril  20 mg Oral Daily  . metoprolol tartrate  12.5 mg Oral BID  . pantoprazole  40 mg Oral Daily  . QUEtiapine  25 mg Oral QHS  . sertraline  100 mg Oral BID   Continuous Infusions: . sodium chloride     PRN  Meds:.albuterol, HYDROcodone-acetaminophen, ipratropium-albuterol, methocarbamol (ROBAXIN) IV, methocarbamol, metoprolol   Results for orders placed during the hospital encounter of 01/23/14 (from the past 48 hour(s))  CBC WITH DIFFERENTIAL     Status: Abnormal   Collection Time    01/23/14  7:35 PM      Result Value Ref Range   WBC 10.4  4.0 - 10.5 K/uL   RBC 4.28  3.87 - 5.11 MIL/uL   Hemoglobin 10.9 (*) 12.0 - 15.0 g/dL   HCT 33.8 (*) 36.0 - 46.0 %   MCV 79.0  78.0 - 100.0 fL   MCH 25.5 (*) 26.0 - 34.0 pg   MCHC 32.2  30.0 - 36.0 g/dL   RDW 14.7  11.5 - 15.5 %   Platelets 173  150 - 400 K/uL   Neutrophils Relative % 80 (*) 43 - 77 %   Neutro Abs 8.4 (*) 1.7 - 7.7 K/uL   Lymphocytes Relative 12  12 - 46 %   Lymphs Abs 1.3  0.7 - 4.0 K/uL   Monocytes Relative 7  3 - 12 %   Monocytes Absolute 0.7  0.1 - 1.0 K/uL   Eosinophils Relative 1  0 - 5 %   Eosinophils Absolute 0.1  0.0 - 0.7 K/uL   Basophils Relative  0  0 - 1 %   Basophils Absolute 0.0  0.0 - 0.1 K/uL  BASIC METABOLIC PANEL     Status: Abnormal   Collection Time    01/23/14  7:35 PM      Result Value Ref Range   Sodium 139  137 - 147 mEq/L   Potassium 4.0  3.7 - 5.3 mEq/L   Chloride 99  96 - 112 mEq/L   CO2 26  19 - 32 mEq/L   Glucose, Bld 149 (*) 70 - 99 mg/dL   BUN 23  6 - 23 mg/dL   Creatinine, Ser 1.36 (*) 0.50 - 1.10 mg/dL   Calcium 9.1  8.4 - 10.5 mg/dL   GFR calc non Af Amer 38 (*) >90 mL/min   GFR calc Af Amer 44 (*) >90 mL/min   Comment: (NOTE)     The eGFR has been calculated using the CKD EPI equation.     This calculation has not been validated in all clinical situations.     eGFR's persistently <90 mL/min signify possible Chronic Kidney     Disease.   Anion gap 14  5 - 15  URINALYSIS, ROUTINE W REFLEX MICROSCOPIC     Status: Abnormal   Collection Time    01/23/14  8:16 PM      Result Value Ref Range   Color, Urine YELLOW  YELLOW   APPearance HAZY (*) CLEAR   Specific Gravity, Urine 1.015   1.005 - 1.030   pH 6.0  5.0 - 8.0   Glucose, UA NEGATIVE  NEGATIVE mg/dL   Hgb urine dipstick TRACE (*) NEGATIVE   Bilirubin Urine NEGATIVE  NEGATIVE   Ketones, ur NEGATIVE  NEGATIVE mg/dL   Protein, ur TRACE (*) NEGATIVE mg/dL   Urobilinogen, UA 0.2  0.0 - 1.0 mg/dL   Nitrite POSITIVE (*) NEGATIVE   Leukocytes, UA MODERATE (*) NEGATIVE  URINE MICROSCOPIC-ADD ON     Status: Abnormal   Collection Time    01/23/14  8:16 PM      Result Value Ref Range   Squamous Epithelial / LPF RARE  RARE   WBC, UA TOO NUMEROUS TO COUNT  <3 WBC/hpf   Bacteria, UA MANY (*) RARE  GLUCOSE, CAPILLARY     Status: Abnormal   Collection Time    01/24/14 12:41 AM      Result Value Ref Range   Glucose-Capillary 119 (*) 70 - 99 mg/dL  COMPREHENSIVE METABOLIC PANEL     Status: Abnormal   Collection Time    01/24/14  4:00 AM      Result Value Ref Range   Sodium 140  137 - 147 mEq/L   Potassium 4.0  3.7 - 5.3 mEq/L   Chloride 102  96 - 112 mEq/L   CO2 24  19 - 32 mEq/L   Glucose, Bld 123 (*) 70 - 99 mg/dL   BUN 20  6 - 23 mg/dL   Creatinine, Ser 1.29 (*) 0.50 - 1.10 mg/dL   Calcium 8.5  8.4 - 10.5 mg/dL   Total Protein 5.9 (*) 6.0 - 8.3 g/dL   Albumin 3.2 (*) 3.5 - 5.2 g/dL   AST 12  0 - 37 U/L   ALT 8  0 - 35 U/L   Alkaline Phosphatase 76  39 - 117 U/L   Total Bilirubin 0.3  0.3 - 1.2 mg/dL   GFR calc non Af Amer 40 (*) >90 mL/min   GFR calc Af Amer 46 (*) >90  mL/min   Comment: (NOTE)     The eGFR has been calculated using the CKD EPI equation.     This calculation has not been validated in all clinical situations.     eGFR's persistently <90 mL/min signify possible Chronic Kidney     Disease.   Anion gap 14  5 - 15  CBC     Status: Abnormal   Collection Time    01/24/14  4:00 AM      Result Value Ref Range   WBC 10.4  4.0 - 10.5 K/uL   RBC 3.54 (*) 3.87 - 5.11 MIL/uL   Hemoglobin 8.9 (*) 12.0 - 15.0 g/dL   HCT 28.2 (*) 36.0 - 46.0 %   MCV 79.7  78.0 - 100.0 fL   MCH 25.1 (*) 26.0 - 34.0 pg    MCHC 31.6  30.0 - 36.0 g/dL   RDW 14.8  11.5 - 15.5 %   Platelets 150  150 - 400 K/uL  GLUCOSE, CAPILLARY     Status: Abnormal   Collection Time    01/24/14  6:32 AM      Result Value Ref Range   Glucose-Capillary 103 (*) 70 - 99 mg/dL    Dg Chest 1 View  01/23/2014   CLINICAL DATA:  Distal femur fracture.  EXAM: CHEST - 1 VIEW  COMPARISON:  07/07/2012  FINDINGS: The heart size appears mildly enlarged. No pleural effusion or edema. There is no airspace consolidation identified. There is a calcified granuloma in the right lower lobe. Calcified atherosclerotic plaque is noted within the aortic arch.  IMPRESSION: 1. No acute cardiopulmonary abnormalities. 2. Atherosclerotic disease.   Electronically Signed   By: Kerby Moors M.D.   On: 01/23/2014 19:28   Dg Pelvis 1-2 Views  01/23/2014   CLINICAL DATA:  Fall, distal femur fracture  EXAM: PELVIS - 1-2 VIEW  COMPARISON:  None.  FINDINGS: No fracture or dislocation is seen.  Mild degenerative changes of the bilateral hips.  Visualized bony pelvis appears intact.  Degenerative changes the lower lumbar spine.  Vascular calcifications.  IMPRESSION: No fracture or dislocation is seen.   Electronically Signed   By: Julian Hy M.D.   On: 01/23/2014 19:29   Dg Femur Left  01/23/2014   CLINICAL DATA:  Fracture of the distal femur.  EXAM: LEFT FEMUR - 2 VIEW  COMPARISON:  None  FINDINGS: The patient is status post left total knee arthroplasty. There is a comminuted distal metaphysis heel fracture involving the left femur. There is posterior displacement of the distal fracture fragments by 1/2 shaft's width. There is also mild lateral angulation of the distal fracture fragments.  IMPRESSION: 1. Comminuted fracture involves the distal femur.   Electronically Signed   By: Kerby Moors M.D.   On: 01/23/2014 19:29   Dg Knee Complete 4 Views Left  01/23/2014   CLINICAL DATA:  Left knee pain after a fall.  EXAM: LEFT KNEE - COMPLETE 4+ VIEW  COMPARISON:   None.  FINDINGS: There is a mildly comminuted fracture of the distal femoral metadiaphysis, involving the superior margin of the left total knee arthroplasty. Apex and medial and volar angulation of the fracture fragments. Bones appear osteopenic.  IMPRESSION: Distal left femur fracture associated with a left total knee arthroplasty.   Electronically Signed   By: Lorin Picket M.D.   On: 01/23/2014 18:58    Review of Systems  Constitutional: Negative.   HENT: Positive for hearing loss. Negative for congestion, ear  discharge, ear pain, nosebleeds, sore throat and tinnitus.   Eyes: Negative.   Respiratory: Negative for cough, hemoptysis, sputum production, shortness of breath, wheezing and stridor.   Cardiovascular: Negative for chest pain, palpitations and claudication.  Gastrointestinal: Negative.   Genitourinary: Negative.        Current UTI  Musculoskeletal: Positive for falls and joint pain.  Skin: Negative.   Neurological: Positive for dizziness. Negative for tingling, tremors, sensory change, speech change, focal weakness, seizures, loss of consciousness and headaches.  Endo/Heme/Allergies: Negative.   Psychiatric/Behavioral: Positive for depression. Negative for suicidal ideas, hallucinations, memory loss and substance abuse. The patient is not nervous/anxious and does not have insomnia.    Blood pressure 114/90, pulse 63, temperature 98 F (36.7 C), temperature source Oral, resp. rate 18, height 5' 6.14" (1.68 m), weight 86.7 kg (191 lb 2.2 oz), SpO2 99.00%. Physical Exam  Constitutional: She is oriented to person, place, and time.  Thin appears older than chronological age  HENT:  Head: Normocephalic and atraumatic.  Mouth/Throat: Oropharynx is clear and moist.  Eyes: EOM are normal. Pupils are equal, round, and reactive to light.  Neck: Neck supple.  Cardiovascular: Normal rate.   Murmur heard. Respiratory: Effort normal.  GI: Soft.  Genitourinary:  Not pertinent to  current symptomatology therefore not examined.  Musculoskeletal:  Left leg in bucks traction and knee immobilizer. Foot is warm to touch 1+dp pulse  Neurological: She is alert and oriented to person, place, and time.  Skin: Skin is warm and dry.  Psychiatric: She has a normal mood and affect.    Assessment Principal Problem:   Femur fracture, left Active Problems:   HYPERLIPIDEMIA, MIXED   Essential hypertension, benign   CORONARY ATHEROSCLEROSIS NATIVE CORONARY ARTERY   Aortic valve disorders   Mitral valve disorders   Syncope   Diabetes   UTI (urinary tract infection)  Plan: She has been cleared by the hospitalist.  She has been started Rocephin 1 gm IV for her UTI.  She will need Vancomycin perioperatively.  She is a DNR.  This was confirmed with the patient and her son.  Dr Fredonia Highland will do an open reduction internal fixation of her left femur.  The risks, benefits, and possible complications of the procedure were discussed in detail with the patient and her son.  They are without question.  , J 01/24/2014, 7:47 AM

## 2014-01-24 NOTE — Anesthesia Postprocedure Evaluation (Signed)
  Anesthesia Post-op Note  Patient: Brenda Mcdonald  Procedure(s) Performed: Procedure(s): OPEN REDUCTION INTERNAL FIXATION (ORIF) DISTAL FEMUR FRACTURE (Left)  Patient Location: PACU  Anesthesia Type:General and block  Level of Consciousness: awake and alert   Airway and Oxygen Therapy: Patient Spontanous Breathing  Post-op Pain: mild  Post-op Assessment: Post-op Vital signs reviewed, Patient's Cardiovascular Status Stable and Respiratory Function Stable  Post-op Vital Signs: Reviewed  Filed Vitals:   01/24/14 1545  BP: 134/47  Pulse: 59  Temp:   Resp: 14    Complications: No apparent anesthesia complications

## 2014-01-24 NOTE — Consult Note (Signed)
Reason for Consult:left femur fracture Referring Physician: Hospitalist  Brenda Mcdonald is an 72 y.o. female.  HPI: This patient has a 15 year history of limited ambulation and being primarily wheelchair bound since a total knee replacement on her left knee.  She was getting up from her recliner yesterday and fell hyperflexing her leg.  She felt a pop.  Past Medical History  Diagnosis Date  . COPD (chronic obstructive pulmonary disease)   . Bipolar disorder   . TIA (transient ischemic attack)   . Dysphagia   . Essential hypertension, benign   . Diabetes mellitus, type 2   . Coronary atherosclerosis of native coronary artery     Nonobstructive  . Pulmonary nodule July 2009    Nonspecific 5 mm right middle lobe    Past Surgical History  Procedure Laterality Date  . Appendectomy    . Cholecystectomy    . Vesicovaginal fistula closure w/ tah    . Total knee arthroplasty    . Knee arthroscopy      Family History  Problem Relation Age of Onset  . Coronary artery disease      Social History:  reports that she quit smoking about 3 years ago. Her smoking use included Cigarettes. She has a 29 pack-year smoking history. She has never used smokeless tobacco. She reports that she does not drink alcohol or use illicit drugs.  Allergies:  Allergies  Allergen Reactions  . Azithromycin   . Penicillins   . Tetracycline     Medications: I have reviewed the patient's current medications. Scheduled Meds: . atorvastatin  10 mg Oral QHS  . cefTRIAXone (ROCEPHIN)  IV  1 g Intravenous Q24H  . darifenacin  7.5 mg Oral Daily  . enoxaparin (LOVENOX) injection  40 mg Subcutaneous Daily  . insulin aspart  0-9 Units Subcutaneous TID WC  . lamoTRIgine  200 mg Oral Daily  . lisinopril  20 mg Oral Daily  . metoprolol tartrate  12.5 mg Oral BID  . pantoprazole  40 mg Oral Daily  . QUEtiapine  25 mg Oral QHS  . sertraline  100 mg Oral BID   Continuous Infusions: . sodium chloride     PRN  Meds:.albuterol, HYDROcodone-acetaminophen, ipratropium-albuterol, methocarbamol (ROBAXIN) IV, methocarbamol, metoprolol   Results for orders placed during the hospital encounter of 01/23/14 (from the past 48 hour(s))  CBC WITH DIFFERENTIAL     Status: Abnormal   Collection Time    01/23/14  7:35 PM      Result Value Ref Range   WBC 10.4  4.0 - 10.5 K/uL   RBC 4.28  3.87 - 5.11 MIL/uL   Hemoglobin 10.9 (*) 12.0 - 15.0 g/dL   HCT 33.8 (*) 36.0 - 46.0 %   MCV 79.0  78.0 - 100.0 fL   MCH 25.5 (*) 26.0 - 34.0 pg   MCHC 32.2  30.0 - 36.0 g/dL   RDW 14.7  11.5 - 15.5 %   Platelets 173  150 - 400 K/uL   Neutrophils Relative % 80 (*) 43 - 77 %   Neutro Abs 8.4 (*) 1.7 - 7.7 K/uL   Lymphocytes Relative 12  12 - 46 %   Lymphs Abs 1.3  0.7 - 4.0 K/uL   Monocytes Relative 7  3 - 12 %   Monocytes Absolute 0.7  0.1 - 1.0 K/uL   Eosinophils Relative 1  0 - 5 %   Eosinophils Absolute 0.1  0.0 - 0.7 K/uL   Basophils Relative   0  0 - 1 %   Basophils Absolute 0.0  0.0 - 0.1 K/uL  BASIC METABOLIC PANEL     Status: Abnormal   Collection Time    01/23/14  7:35 PM      Result Value Ref Range   Sodium 139  137 - 147 mEq/L   Potassium 4.0  3.7 - 5.3 mEq/L   Chloride 99  96 - 112 mEq/L   CO2 26  19 - 32 mEq/L   Glucose, Bld 149 (*) 70 - 99 mg/dL   BUN 23  6 - 23 mg/dL   Creatinine, Ser 1.36 (*) 0.50 - 1.10 mg/dL   Calcium 9.1  8.4 - 10.5 mg/dL   GFR calc non Af Amer 38 (*) >90 mL/min   GFR calc Af Amer 44 (*) >90 mL/min   Comment: (NOTE)     The eGFR has been calculated using the CKD EPI equation.     This calculation has not been validated in all clinical situations.     eGFR's persistently <90 mL/min signify possible Chronic Kidney     Disease.   Anion gap 14  5 - 15  URINALYSIS, ROUTINE W REFLEX MICROSCOPIC     Status: Abnormal   Collection Time    01/23/14  8:16 PM      Result Value Ref Range   Color, Urine YELLOW  YELLOW   APPearance HAZY (*) CLEAR   Specific Gravity, Urine 1.015   1.005 - 1.030   pH 6.0  5.0 - 8.0   Glucose, UA NEGATIVE  NEGATIVE mg/dL   Hgb urine dipstick TRACE (*) NEGATIVE   Bilirubin Urine NEGATIVE  NEGATIVE   Ketones, ur NEGATIVE  NEGATIVE mg/dL   Protein, ur TRACE (*) NEGATIVE mg/dL   Urobilinogen, UA 0.2  0.0 - 1.0 mg/dL   Nitrite POSITIVE (*) NEGATIVE   Leukocytes, UA MODERATE (*) NEGATIVE  URINE MICROSCOPIC-ADD ON     Status: Abnormal   Collection Time    01/23/14  8:16 PM      Result Value Ref Range   Squamous Epithelial / LPF RARE  RARE   WBC, UA TOO NUMEROUS TO COUNT  <3 WBC/hpf   Bacteria, UA MANY (*) RARE  GLUCOSE, CAPILLARY     Status: Abnormal   Collection Time    01/24/14 12:41 AM      Result Value Ref Range   Glucose-Capillary 119 (*) 70 - 99 mg/dL  COMPREHENSIVE METABOLIC PANEL     Status: Abnormal   Collection Time    01/24/14  4:00 AM      Result Value Ref Range   Sodium 140  137 - 147 mEq/L   Potassium 4.0  3.7 - 5.3 mEq/L   Chloride 102  96 - 112 mEq/L   CO2 24  19 - 32 mEq/L   Glucose, Bld 123 (*) 70 - 99 mg/dL   BUN 20  6 - 23 mg/dL   Creatinine, Ser 1.29 (*) 0.50 - 1.10 mg/dL   Calcium 8.5  8.4 - 10.5 mg/dL   Total Protein 5.9 (*) 6.0 - 8.3 g/dL   Albumin 3.2 (*) 3.5 - 5.2 g/dL   AST 12  0 - 37 U/L   ALT 8  0 - 35 U/L   Alkaline Phosphatase 76  39 - 117 U/L   Total Bilirubin 0.3  0.3 - 1.2 mg/dL   GFR calc non Af Amer 40 (*) >90 mL/min   GFR calc Af Amer 46 (*) >90   mL/min   Comment: (NOTE)     The eGFR has been calculated using the CKD EPI equation.     This calculation has not been validated in all clinical situations.     eGFR's persistently <90 mL/min signify possible Chronic Kidney     Disease.   Anion gap 14  5 - 15  CBC     Status: Abnormal   Collection Time    01/24/14  4:00 AM      Result Value Ref Range   WBC 10.4  4.0 - 10.5 K/uL   RBC 3.54 (*) 3.87 - 5.11 MIL/uL   Hemoglobin 8.9 (*) 12.0 - 15.0 g/dL   HCT 28.2 (*) 36.0 - 46.0 %   MCV 79.7  78.0 - 100.0 fL   MCH 25.1 (*) 26.0 - 34.0 pg    MCHC 31.6  30.0 - 36.0 g/dL   RDW 14.8  11.5 - 15.5 %   Platelets 150  150 - 400 K/uL  GLUCOSE, CAPILLARY     Status: Abnormal   Collection Time    01/24/14  6:32 AM      Result Value Ref Range   Glucose-Capillary 103 (*) 70 - 99 mg/dL    Dg Chest 1 View  01/23/2014   CLINICAL DATA:  Distal femur fracture.  EXAM: CHEST - 1 VIEW  COMPARISON:  07/07/2012  FINDINGS: The heart size appears mildly enlarged. No pleural effusion or edema. There is no airspace consolidation identified. There is a calcified granuloma in the right lower lobe. Calcified atherosclerotic plaque is noted within the aortic arch.  IMPRESSION: 1. No acute cardiopulmonary abnormalities. 2. Atherosclerotic disease.   Electronically Signed   By: Taylor  Stroud M.D.   On: 01/23/2014 19:28   Dg Pelvis 1-2 Views  01/23/2014   CLINICAL DATA:  Fall, distal femur fracture  EXAM: PELVIS - 1-2 VIEW  COMPARISON:  None.  FINDINGS: No fracture or dislocation is seen.  Mild degenerative changes of the bilateral hips.  Visualized bony pelvis appears intact.  Degenerative changes the lower lumbar spine.  Vascular calcifications.  IMPRESSION: No fracture or dislocation is seen.   Electronically Signed   By: Sriyesh  Krishnan M.D.   On: 01/23/2014 19:29   Dg Femur Left  01/23/2014   CLINICAL DATA:  Fracture of the distal femur.  EXAM: LEFT FEMUR - 2 VIEW  COMPARISON:  None  FINDINGS: The patient is status post left total knee arthroplasty. There is a comminuted distal metaphysis heel fracture involving the left femur. There is posterior displacement of the distal fracture fragments by 1/2 shaft's width. There is also mild lateral angulation of the distal fracture fragments.  IMPRESSION: 1. Comminuted fracture involves the distal femur.   Electronically Signed   By: Taylor  Stroud M.D.   On: 01/23/2014 19:29   Dg Knee Complete 4 Views Left  01/23/2014   CLINICAL DATA:  Left knee pain after a fall.  EXAM: LEFT KNEE - COMPLETE 4+ VIEW  COMPARISON:   None.  FINDINGS: There is a mildly comminuted fracture of the distal femoral metadiaphysis, involving the superior margin of the left total knee arthroplasty. Apex and medial and volar angulation of the fracture fragments. Bones appear osteopenic.  IMPRESSION: Distal left femur fracture associated with a left total knee arthroplasty.   Electronically Signed   By: Melinda  Blietz M.D.   On: 01/23/2014 18:58    Review of Systems  Constitutional: Negative.   HENT: Positive for hearing loss. Negative for congestion, ear   discharge, ear pain, nosebleeds, sore throat and tinnitus.   Eyes: Negative.   Respiratory: Negative for cough, hemoptysis, sputum production, shortness of breath, wheezing and stridor.   Cardiovascular: Negative for chest pain, palpitations and claudication.  Gastrointestinal: Negative.   Genitourinary: Negative.        Current UTI  Musculoskeletal: Positive for falls and joint pain.  Skin: Negative.   Neurological: Positive for dizziness. Negative for tingling, tremors, sensory change, speech change, focal weakness, seizures, loss of consciousness and headaches.  Endo/Heme/Allergies: Negative.   Psychiatric/Behavioral: Positive for depression. Negative for suicidal ideas, hallucinations, memory loss and substance abuse. The patient is not nervous/anxious and does not have insomnia.    Blood pressure 114/90, pulse 63, temperature 98 F (36.7 C), temperature source Oral, resp. rate 18, height 5' 6.14" (1.68 m), weight 86.7 kg (191 lb 2.2 oz), SpO2 99.00%. Physical Exam  Constitutional: She is oriented to person, place, and time.  Thin appears older than chronological age  HENT:  Head: Normocephalic and atraumatic.  Mouth/Throat: Oropharynx is clear and moist.  Eyes: EOM are normal. Pupils are equal, round, and reactive to light.  Neck: Neck supple.  Cardiovascular: Normal rate.   Murmur heard. Respiratory: Effort normal.  GI: Soft.  Genitourinary:  Not pertinent to  current symptomatology therefore not examined.  Musculoskeletal:  Left leg in bucks traction and knee immobilizer. Foot is warm to touch 1+dp pulse  Neurological: She is alert and oriented to person, place, and time.  Skin: Skin is warm and dry.  Psychiatric: She has a normal mood and affect.    Assessment Principal Problem:   Femur fracture, left Active Problems:   HYPERLIPIDEMIA, MIXED   Essential hypertension, benign   CORONARY ATHEROSCLEROSIS NATIVE CORONARY ARTERY   Aortic valve disorders   Mitral valve disorders   Syncope   Diabetes   UTI (urinary tract infection)  Plan: She has been cleared by the hospitalist.  She has been started Rocephin 1 gm IV for her UTI.  She will need Vancomycin perioperatively.  She is a DNR.  This was confirmed with the patient and her son.  Dr Tim Murphy will do an open reduction internal fixation of her left femur.  The risks, benefits, and possible complications of the procedure were discussed in detail with the patient and her son.  They are without question.  Calleen Alvis J 01/24/2014, 7:47 AM      

## 2014-01-24 NOTE — Anesthesia Procedure Notes (Addendum)
Anesthesia Regional Block:  Femoral nerve block  Pre-Anesthetic Checklist: ,, timeout performed, Correct Patient, Correct Site, Correct Laterality, Correct Procedure, Correct Position, site marked, Risks and benefits discussed, pre-op evaluation,  At surgeon's request and post-op pain management  Laterality: Left  Prep: Maximum Sterile Barrier Precautions used and chloraprep       Needles:  Injection technique: Single-shot  Needle Type: Echogenic Stimulator Needle     Needle Length: 5cm 5 cm Needle Gauge: 22 and 22 G    Additional Needles:  Procedures: ultrasound guided (picture in chart) Femoral nerve block  Nerve Stimulator or Paresthesia:  Response: Patellar respose,   Additional Responses:   Narrative:  Start time: 01/24/2014 12:20 PM End time: 01/24/2014 12:30 PM Injection made incrementally with aspirations every 5 mL. Anesthesiologist: Sherrine Salberg,MD  Additional Notes: 2% Lidocaine skin wheel.

## 2014-01-24 NOTE — Interval H&P Note (Signed)
History and Physical Interval Note:  01/24/2014 10:35 AM  Brenda Mcdonald  has presented today for surgery, with the diagnosis of left femur fracture  The various methods of treatment have been discussed with the patient and family. After consideration of risks, benefits and other options for treatment, the patient has consented to  Procedure(s): OPEN REDUCTION INTERNAL FIXATION (ORIF) DISTAL FEMUR FRACTURE (Left) as a surgical intervention .  The patient's history has been reviewed, patient examined, no change in status, stable for surgery.  I have reviewed the patient's chart and labs.  Questions were answered to the patient's satisfaction.     Jerian Morais, D

## 2014-01-24 NOTE — H&P (View-Only) (Signed)
I participated in the care of this patient and agree with the above history, physical and evaluation. I performed a review of the history and a physical exam as detailed   Maeve Debord Daniel Bibi Economos MD  

## 2014-01-24 NOTE — Consult Note (Addendum)
I participated in the care of this patient and agree with the above history, physical and evaluation. I performed a review of the history and a physical exam as detailed  On my exam of the LLE: she has baseline neuropathy with minimal sensation to the L foot. She wiggles toes but I cannnot motivate her to demonstrate TA.   Laqueta Due MD

## 2014-01-24 NOTE — Anesthesia Preprocedure Evaluation (Signed)
Anesthesia Evaluation  Patient identified by MRN, date of birth, ID band Patient awake    Reviewed: Allergy & Precautions, H&P , NPO status , Patient's Chart, lab work & pertinent test results, reviewed documented beta blocker date and time   Airway Mallampati: II TM Distance: >3 FB Neck ROM: Full    Dental no notable dental hx. (+) Edentulous Upper, Edentulous Lower, Dental Advisory Given   Pulmonary COPDformer smoker,  breath sounds clear to auscultation  Pulmonary exam normal       Cardiovascular hypertension, On Medications and On Home Beta Blockers + CAD Rhythm:Regular Rate:Normal     Neuro/Psych TIAnegative neurological ROS     GI/Hepatic negative GI ROS, Neg liver ROS,   Endo/Other  diabetes, Type 2, Oral Hypoglycemic Agents  Renal/GU negative Renal ROS  negative genitourinary   Musculoskeletal   Abdominal   Peds  Hematology negative hematology ROS (+)   Anesthesia Other Findings   Reproductive/Obstetrics negative OB ROS                           Anesthesia Physical Anesthesia Plan  ASA: III  Anesthesia Plan: General   Post-op Pain Management:    Induction: Intravenous  Airway Management Planned: Oral ETT  Additional Equipment:   Intra-op Plan:   Post-operative Plan: Extubation in OR  Informed Consent: I have reviewed the patients History and Physical, chart, labs and discussed the procedure including the risks, benefits and alternatives for the proposed anesthesia with the patient or authorized representative who has indicated his/her understanding and acceptance.   Dental advisory given  Plan Discussed with: CRNA  Anesthesia Plan Comments:         Anesthesia Quick Evaluation

## 2014-01-25 ENCOUNTER — Encounter (HOSPITAL_COMMUNITY): Payer: Self-pay | Admitting: General Practice

## 2014-01-25 ENCOUNTER — Inpatient Hospital Stay (HOSPITAL_COMMUNITY): Payer: Medicare Other

## 2014-01-25 LAB — BASIC METABOLIC PANEL
Anion gap: 17 — ABNORMAL HIGH (ref 5–15)
BUN: 17 mg/dL (ref 6–23)
CO2: 22 mEq/L (ref 19–32)
Calcium: 8.4 mg/dL (ref 8.4–10.5)
Chloride: 101 mEq/L (ref 96–112)
Creatinine, Ser: 1.26 mg/dL — ABNORMAL HIGH (ref 0.50–1.10)
GFR calc Af Amer: 48 mL/min — ABNORMAL LOW (ref >90)
GFR, EST NON AFRICAN AMERICAN: 41 mL/min — AB (ref >90)
Glucose, Bld: 129 mg/dL — ABNORMAL HIGH (ref 70–99)
POTASSIUM: 3.9 meq/L (ref 3.7–5.3)
Sodium: 140 mEq/L (ref 137–147)

## 2014-01-25 LAB — CBC
HCT: 29.8 % — ABNORMAL LOW (ref 36.0–46.0)
Hemoglobin: 9.3 g/dL — ABNORMAL LOW (ref 12.0–15.0)
MCH: 25.8 pg — ABNORMAL LOW (ref 26.0–34.0)
MCHC: 31.2 g/dL (ref 30.0–36.0)
MCV: 82.8 fL (ref 78.0–100.0)
Platelets: 152 K/uL (ref 150–400)
RBC: 3.6 MIL/uL — ABNORMAL LOW (ref 3.87–5.11)
RDW: 15 % (ref 11.5–15.5)
WBC: 12.7 K/uL — ABNORMAL HIGH (ref 4.0–10.5)

## 2014-01-25 LAB — GLUCOSE, CAPILLARY
Glucose-Capillary: 114 mg/dL — ABNORMAL HIGH (ref 70–99)
Glucose-Capillary: 124 mg/dL — ABNORMAL HIGH (ref 70–99)
Glucose-Capillary: 105 mg/dL — ABNORMAL HIGH (ref 70–99)

## 2014-01-25 MED ORDER — HYDROCODONE-ACETAMINOPHEN 5-325 MG PO TABS
1.0000 | ORAL_TABLET | Freq: Four times a day (QID) | ORAL | Status: DC | PRN
  Administered 2014-01-25 – 2014-01-28 (×5): 1 via ORAL
  Filled 2014-01-25 (×5): qty 1

## 2014-01-25 MED ORDER — LISINOPRIL 10 MG PO TABS
10.0000 mg | ORAL_TABLET | Freq: Every day | ORAL | Status: DC
  Administered 2014-01-26 – 2014-01-28 (×3): 10 mg via ORAL
  Filled 2014-01-25 (×4): qty 1

## 2014-01-25 MED ORDER — SODIUM CHLORIDE 0.9 % IV SOLN
1.0000 g | Freq: Two times a day (BID) | INTRAVENOUS | Status: DC
  Administered 2014-01-25 – 2014-01-26 (×4): 1 g via INTRAVENOUS
  Filled 2014-01-25 (×6): qty 1

## 2014-01-25 MED ORDER — SODIUM CHLORIDE 0.9 % IV BOLUS (SEPSIS)
250.0000 mL | Freq: Once | INTRAVENOUS | Status: AC
  Administered 2014-01-25: 250 mL via INTRAVENOUS

## 2014-01-25 NOTE — Progress Notes (Signed)
Utilization review completed. Allisha Harter, RN, BSN. 

## 2014-01-25 NOTE — Progress Notes (Addendum)
Patient Demographics  Brenda Mcdonald, is a 73 y.o. female, DOB - Jan 04, 1941, WKM:[id number]  Admit date - 01/23/2014   Admitting Physician Meredeth Ide, MD  Outpatient Primary MD for the patient is VYAS,DHRUV B., MD  LOS - 2   Chief Complaint  Patient presents with  . Knee Injury  . Fall       Subjective:   [name] today has, No headache, No chest pain, No abdominal pain - No Nausea, No new weakness tingling or numbness, No Cough - SOB. Mild dull left leg pain.    Assessment & Plan    Left femur fracture  After mechanical fall at home, she is wheelchair-bound at baseline. Post reduction internal fixation on 01-24-2014 by Dr. Marcial Pacas. Eulah Pont. On Lovenox for DVT prophylaxis, commence PT will require placement. Nonweightbearing on left leg per orthopedics.    Fever night of 01-24-2014  Did have UTI upon admission and was on Rocephin, chest x-ray does not show any pneumonia or signs of aspiration, she appears nontoxic. Will switch antibiotic from Rocephin to meropenem in case she has ESBL UTI. Blood cultures if temperature is over 100.4. Add IS. Monitor. Will have speech evaluate one time as family reports some coughing with oral diet in the past     UTI upon admission  Placed on 3 days of Rocephin, follow cultures.      Diabetes mellitus  For now continue sliding scale insulin. Will check CBGs every 6 hours.  CBG (last 3)   Recent Labs  01/24/14 1704 01/24/14 2148 01/25/14 0630  GLUCAP 119* 103* 114*        COPD  Currently stable, no shortness of breath. We'll continue DuoNeb nebulizers every 6 hours when necessary for shortness of breath.      History of nonobstructive CAD with old left bundle branch block, EF in 20 1255-60%. With moderate aortic valve and mitral  valve regurgitation  Patient has a history of nonobstructive CAD, currently on beta blockers. We'll continue metoprolol for 12.5 g by mouth twice a day. Daily aspirin and as needed IV Lopressor ordered. Symptom-free from this standpoint. On ACE inhibitor which will be continued.     Bipolar disorder with mild dementia.  Continue Seroquel, Zoloft and Xanax twice a day. Is at risk for delirium. Family explained and educated.      CKD 3    Creatinine appears to be close to baseline which is around 1.3.      Code Status: DO NOT RESUSCITATE  Family Communication: Son and daughter bedside  Disposition Plan: SNF   Procedures left femoral open reduction internal fixation by Dr. Margarita Rana on 01-24-2014   Consults  Ortho   Medications  Scheduled Meds: . ALPRAZolam  0.25 mg Oral QHS  . aspirin  81 mg Oral Daily  . atorvastatin  10 mg Oral QHS  . darifenacin  7.5 mg Oral Daily  . enoxaparin (LOVENOX) injection  40 mg Subcutaneous Daily  . insulin aspart  0-9 Units Subcutaneous TID WC  . lamoTRIgine  200 mg Oral Daily  . lisinopril  20 mg Oral Daily  . metoprolol tartrate  12.5 mg Oral BID  . pantoprazole  40 mg Oral Daily  . QUEtiapine  25 mg Oral  QHS  . sertraline  100 mg Oral BID   Continuous Infusions:   PRN Meds:.acetaminophen, acetaminophen, albuterol, HYDROcodone-acetaminophen, ipratropium-albuterol, menthol-cetylpyridinium, methocarbamol (ROBAXIN) IV, methocarbamol, metoprolol, morphine injection, phenol  DVT Prophylaxis  Lovenox    Lab Results  Component Value Date   PLT 152 01/25/2014    Antibiotics     Anti-infectives   Start     Dose/Rate Route Frequency Ordered Stop   01/24/14 1930  ceFAZolin (ANCEF) IVPB 2 g/50 mL premix     2 g 100 mL/hr over 30 Minutes Intravenous Every 6 hours 01/24/14 1657 01/25/14 0421   01/24/14 0830  ciprofloxacin (CIPRO) IVPB 400 mg  Status:  Discontinued     400 mg 200 mL/hr over 60 Minutes Intravenous 2 times daily  01/24/14 0727 01/24/14 0745   01/24/14 0800  cefTRIAXone (ROCEPHIN) 1 g in dextrose 5 % 50 mL IVPB  Status:  Discontinued     1 g 100 mL/hr over 30 Minutes Intravenous Every 24 hours 01/24/14 0745 01/25/14 0848          Objective:   Filed Vitals:   01/25/14 0354 01/25/14 0615 01/25/14 0618 01/25/14 0705  BP:  90/54 98/38 144/38  Pulse:  87    Temp: 99.6 F (37.6 C) 99.4 F (37.4 C)    TempSrc: Oral Oral    Resp:  16    Height:      Weight:      SpO2:  97%      Wt Readings from Last 3 Encounters:  01/24/14 86.7 kg (191 lb 2.2 oz)  01/24/14 86.7 kg (191 lb 2.2 oz)  01/24/14 86.7 kg (191 lb 2.2 oz)     Intake/Output Summary (Last 24 hours) at 01/25/14 0848 Last data filed at 01/24/14 1600  Gross per 24 hour  Intake   1120 ml  Output    800 ml  Net    320 ml     Physical Exam  Awake Alert, Oriented X 2, No new F.N deficits, Normal affect [place].AT,PERRAL Supple Neck,No JVD, No cervical lymphadenopathy appriciated.  Symmetrical Chest wall movement, Good air movement bilaterally, CTAB RRR,No Gallops,Rubs or new Murmurs, No Parasternal Heave +ve B.Sounds, Abd Soft, No tenderness, No organomegaly appriciated, No rebound - guarding or rigidity. No Cyanosis, Clubbing or edema, No new Rash or bruise, L Knee in immobilizer   Data Review   Micro Results Recent Results (from the past 240 hour(s))  SURGICAL PCR SCREEN     Status: Abnormal   Collection Time    01/24/14 10:21 AM      Result Value Ref Range Status   MRSA, PCR NEGATIVE  NEGATIVE Final   Staphylococcus aureus POSITIVE (*) NEGATIVE Final   Comment:            The Xpert SA Assay (FDA     approved for NASAL specimens     in patients over 9 years of age),     is one component of     a comprehensive surveillance     program.  Test performance has     been validated by [employer] for patients greater     than or equal to 97 year old.     It is not intended     to diagnose infection nor to     guide  or monitor treatment.    Radiology Reports Dg Chest 1 View  01/23/2014   CLINICAL DATA:  Distal femur fracture.  EXAM: CHEST -  1 VIEW  COMPARISON:  07/07/2012  FINDINGS: The heart size appears mildly enlarged. No pleural effusion or edema. There is no airspace consolidation identified. There is a calcified granuloma in the right lower lobe. Calcified atherosclerotic plaque is noted within the aortic arch.  IMPRESSION: 1. No acute cardiopulmonary abnormalities. 2. Atherosclerotic disease.   Electronically Signed   By: Signa Kell M.D.   On: 01/23/2014 19:28   Dg Pelvis 1-2 Views  01/23/2014   CLINICAL DATA:  Fall, distal femur fracture  EXAM: PELVIS - 1-2 VIEW  COMPARISON:  None.  FINDINGS: No fracture or dislocation is seen.  Mild degenerative changes of the bilateral hips.  Visualized bony pelvis appears intact.  Degenerative changes the lower lumbar spine.  Vascular calcifications.  IMPRESSION: No fracture or dislocation is seen.   Electronically Signed   By: Charline Bills M.D.   On: 01/23/2014 19:29   Dg Femur Left  01/23/2014   CLINICAL DATA:  Fracture of the distal femur.  EXAM: LEFT FEMUR - 2 VIEW  COMPARISON:  None  FINDINGS: The patient is status post left total knee arthroplasty. There is a comminuted distal metaphysis heel fracture involving the left femur. There is posterior displacement of the distal fracture fragments by 1/2 shaft's width. There is also mild lateral angulation of the distal fracture fragments.  IMPRESSION: 1. Comminuted fracture involves the distal femur.   Electronically Signed   By: Signa Kell M.D.   On: 01/23/2014 19:29   Dg Knee Complete 4 Views Left  01/23/2014   CLINICAL DATA:  Left knee pain after a fall.  EXAM: LEFT KNEE - COMPLETE 4+ VIEW  COMPARISON:  None.  FINDINGS: There is a mildly comminuted fracture of the distal femoral metadiaphysis, involving the superior margin of the left total knee arthroplasty. Apex and medial and volar angulation of the  fracture fragments. Bones appear osteopenic.  IMPRESSION: Distal left femur fracture associated with a left total knee arthroplasty.   Electronically Signed   By: Leanna Battles M.D.   On: 01/23/2014 18:58    CBC  Recent Labs Lab 01/23/14 1935 01/24/14 0400 01/25/14 0002  WBC 10.4 10.4 12.7*  HGB 10.9* 8.9* 9.3*  HCT 33.8* 28.2* 29.8*  PLT 173 150 152  MCV 79.0 79.7 82.8  MCH 25.5* 25.1* 25.8*  MCHC 32.2 31.6 31.2  RDW 14.7 14.8 15.0  LYMPHSABS 1.3  --   --   MONOABS 0.7  --   --   EOSABS 0.1  --   --   BASOSABS 0.0  --   --     Chemistries   Recent Labs Lab 01/23/14 1935 01/24/14 0400 01/24/14 0835 01/25/14 0002  NA 139 140 141 140  K 4.0 4.0 4.0 3.9  CL 99 102 104 101  CO2 26 24 25 22   GLUCOSE 149* 123* 100* 129*  BUN 23 20 19 17   CREATININE 1.36* 1.29* 1.33* 1.26*  CALCIUM 9.1 8.5 8.5 8.4  AST  --  12  --   --   ALT  --  8  --   --   ALKPHOS  --  76  --   --   BILITOT  --  0.3  --   --    ------------------------------------------------------------------------------------------------------------------ estimated creatinine clearance is 44.2 ml/min (by C-G formula based on Cr of 1.26). ------------------------------------------------------------------------------------------------------------------ No results found for this basename: HGBA1C,  in the last 72 hours ------------------------------------------------------------------------------------------------------------------ No results found for this basename: CHOL, HDL, LDLCALC, TRIG, CHOLHDL, LDLDIRECT,  in the last 72 hours ------------------------------------------------------------------------------------------------------------------ No results found for this basename: TSH, T4TOTAL, FREET3, T3FREE, THYROIDAB,  in the last 72 hours ------------------------------------------------------------------------------------------------------------------ No results found for this basename: VITAMINB12, FOLATE,  FERRITIN, TIBC, IRON, RETICCTPCT,  in the last 72 hours  Coagulation profile  Recent Labs Lab 01/24/14 0835  INR 1.12    No results found for this basename: DDIMER,  in the last 72 hours  Cardiac Enzymes No results found for this basename: CK, CKMB, TROPONINI, MYOGLOBIN,  in the last 168 hours ------------------------------------------------------------------------------------------------------------------ No components found with this basename: POCBNP,      Time Spent in minutes  35   Nissa Stannard K M.D on 01/25/2014 at 8:48 AM  Between 7am to 7pm - Pager - [phone]  After 7pm go to [email or web address] - password [email or web address]  And look for the night coverage person covering for me after hours  [facility] Office  [phone]   **Disclaimer: This note may have been dictated with voice recognition software. Similar sounding words can inadvertently be transcribed and this note may contain transcription errors which may not have been corrected upon publication of note.**

## 2014-01-25 NOTE — Progress Notes (Signed)
OT Cancellation Note  Patient Details Name: SISSY GOETZKE MRN: [id number] DOB: 07-26-1941   Cancelled Treatment:    Reason Eval/Treat Not Completed: Other (comment) Pt is Medicare and current recommended D/C plan is SNF. No apparent immediate acute care OT needs, therefore will defer OT to SNF. If OT eval is needed please call Acute Rehab Dept. at [phone] or text page OT at [phone].     Evette Georges [phone] 01/25/2014, 2:04 PM

## 2014-01-25 NOTE — Progress Notes (Signed)
ANTIBIOTIC CONSULT NOTE - FOLLOW UP  Pharmacy Consult for Meropenem Indication: R/o ESBL UTI   Allergies  Allergen Reactions  . Azithromycin   . Penicillins   . Tetracycline     Patient Measurements: Height: 5' 6.14" (168 cm) Weight: 191 lb 2.2 oz (86.7 kg) (10/2013) IBW/kg (Calculated) : 59.63 Adjusted Body Weight: n/a  Vital Signs: Temp: 99.4 F (37.4 C) (07/03 0615) Temp src: Oral (07/03 0615) BP: 144/38 mmHg (07/03 0705) Pulse Rate: 87 (07/03 0615) Intake/Output from previous day: 07/02 0701 - 07/03 0700 In: 1120 [P.O.:120; I.V.:1000] Out: 800 [Urine:800] Intake/Output from this shift:    Labs:  Recent Labs  01/23/14 1935 01/24/14 0400 01/24/14 0835 01/25/14 0002  WBC 10.4 10.4  --  12.7*  HGB 10.9* 8.9*  --  9.3*  PLT 173 150  --  152  CREATININE 1.36* 1.29* 1.33* 1.26*   Estimated Creatinine Clearance: 44.2 ml/min (by C-G formula based on Cr of 1.26). No results found for this basename: VANCOTROUGH, Leodis Binet, VANCORANDOM, GENTTROUGH, GENTPEAK, GENTRANDOM, TOBRATROUGH, TOBRAPEAK, TOBRARND, AMIKACINPEAK, AMIKACINTROU, AMIKACIN,  in the last 72 hours   Microbiology: Recent Results (from the past 720 hour(s))  SURGICAL PCR SCREEN     Status: Abnormal   Collection Time    01/24/14 10:21 AM      Result Value Ref Range Status   MRSA, PCR NEGATIVE  NEGATIVE Final   Staphylococcus aureus POSITIVE (*) NEGATIVE Final   Comment:            The Xpert SA Assay (FDA     approved for NASAL specimens     in patients over 42 years of age),     is one component of     a comprehensive surveillance     program.  Test performance has     been validated by [employer] for patients greater     than or equal to 62 year old.     It is not intended     to diagnose infection nor to     guide or monitor treatment.    Anti-infectives   Start     Dose/Rate Route Frequency Ordered Stop   01/25/14 1000  meropenem (MERREM) 1 g in sodium chloride 0.9 % 100 mL IVPB     1 g 200 mL/hr over 30 Minutes Intravenous Every 12 hours 01/25/14 0859     01/24/14 1930  ceFAZolin (ANCEF) IVPB 2 g/50 mL premix     2 g 100 mL/hr over 30 Minutes Intravenous Every 6 hours 01/24/14 1657 01/25/14 0421   01/24/14 0830  ciprofloxacin (CIPRO) IVPB 400 mg  Status:  Discontinued     400 mg 200 mL/hr over 60 Minutes Intravenous 2 times daily 01/24/14 0727 01/24/14 0745   01/24/14 0800  cefTRIAXone (ROCEPHIN) 1 g in dextrose 5 % 50 mL IVPB  Status:  Discontinued     1 g 100 mL/hr over 30 Minutes Intravenous Every 24 hours 01/24/14 0745 01/25/14 0848      Assessment: 72 YOF s/p ORIF for distal femur fracture with UTI on admission. She was placed on IV Rocephin but WBC has trended up despite abx therapy and she remains febrile. Expanding coverage for ESBL. CrCl ~ 44 mL/min  Rocephin 7/2>>7/3 Meropenem 7/3>>  7/2 Urine Cx>>    Goal of Therapy:  Resolution of infection  Plan:  1) Meropenem 1 gm IV Q12 hours  2) Monitor CBC, cultures, fever curve, renal fx, and clinical progress 3)  Narrow abx therapy based on culture results    Vinnie Level, PharmD.  Clinical Pharmacist Pager [phone]

## 2014-01-25 NOTE — Evaluation (Signed)
Clinical/Bedside Swallow Evaluation Patient Details  Name: Brenda Mcdonald MRN: [id number] Date of Birth: [birth year]  Today's Date: 01/25/2014 Time: 1150-1212 SLP Time Calculation (min): 22 min  Past Medical History:  Past Medical History  Diagnosis Date  . COPD (chronic obstructive pulmonary disease)   . Bipolar disorder   . TIA (transient ischemic attack)   . Dysphagia   . Essential hypertension, benign   . Diabetes mellitus, type 2   . Coronary atherosclerosis of native coronary artery     Nonobstructive  . Pulmonary nodule July 2009    Nonspecific 5 mm right middle lobe   Past Surgical History:  Past Surgical History  Procedure Laterality Date  . Appendectomy    . Cholecystectomy    . Vesicovaginal fistula closure w/ tah    . Total knee arthroplasty    . Knee arthroscopy     HPI:  73 y.o. female s/p ORIF of distal femur fracture. Hx of COPD, TIA, and HTN. Family reported occasional coughing at home with PO, and therefore SLP swallowing evaluation was ordered.   Assessment / Plan / Recommendation Clinical Impression  Pt exhibited prolonged mastication and bolus formation of Dys 3 textures, suspect due in part to missing dentition as well as decreased sustained attention, requiring Mod cues from SLP. No overt signs of aspiration were observed across textures including mixed consistencies. Recommend to downgrade to Dys 2 textures and thin liquids to facilitate mastication and oral prep/transit. SLP to follow briefly for tolerance.    Aspiration Risk  Moderate    Diet Recommendation Dysphagia 2 (Fine chop);Thin liquid   Liquid Administration via: Cup;Straw Medication Administration: Whole meds with liquid Supervision: Patient able to self feed;Intermittent supervision to cue for compensatory strategies Compensations: Slow rate;Small sips/bites Postural Changes and/or Swallow Maneuvers: Seated upright 90 degrees;Upright 30-60 min after meal    Other  Recommendations Oral  Care Recommendations: Oral care BID   Follow Up Recommendations  Other (comment) (TBD, none anticiapted)    Frequency and Duration min 2x/week  1 week   Pertinent Vitals/Pain Temperature elevated overnight    SLP Swallow Goals     Swallow Study Prior Functional Status  Type of Home: House Available Help at Discharge: Skilled Nursing Facility    General Date of Onset: 01/23/14 HPI: 73 y.o. female s/p ORIF of distal femur fracture. Hx of COPD, TIA, and HTN. Family reported occasional coughing at home with PO, and therefore SLP swallowing evaluation was ordered. Type of Study: Bedside swallow evaluation Previous Swallow Assessment: none in chart Diet Prior to this Study: Dysphagia 3 (soft);Thin liquids Temperature Spikes Noted: Yes Respiratory Status: Nasal cannula Behavior/Cognition: Alert;Cooperative;Confused;Hard of hearing;Requires cueing;Distractible Oral Cavity - Dentition: Edentulous Self-Feeding Abilities: Able to feed self Patient Positioning: Upright in chair Baseline Vocal Quality: Clear    Oral/Motor/Sensory Function     Ice Chips Ice chips: Not tested   Thin Liquid Thin Liquid: Within functional limits Presentation: Cup;Self Fed;Straw    Nectar Thick Nectar Thick Liquid: Not tested   Honey Thick Honey Thick Liquid: Not tested   Puree Puree: Within functional limits Presentation: Self Fed;Spoon   Solid   GO    Solid: Impaired Presentation: Self Fed Oral Phase Impairments: Impaired mastication        Maxcine Ham, M.A. CCC-SLP [phone]  Maxcine Ham 01/25/2014,12:27 PM

## 2014-01-25 NOTE — Evaluation (Signed)
Physical Therapy Evaluation Patient Details Name: Brenda Mcdonald MRN: [id number] DOB: [birth year] Today's Date: 01/25/2014   History of Present Illness  73 y.o. female s/p ORIF of distal femur fracture. Hx of COPD, TIA, and HTN  Clinical Impression  Patient is seen following the above procedure and presents with functional limitations due to the deficits listed below (see PT Problem List). She is mod assist +2 for all mobility assessed today including stand pivot transfer. Patient will benefit from skilled PT to increase their independence and safety with mobility to allow discharge to the venue listed below.       Follow Up Recommendations SNF;Supervision/Assistance - 24 hour    Equipment Recommendations  3in1 (PT)    Recommendations for Other Services       Precautions / Restrictions Precautions Precautions: Fall Required Braces or Orthoses: Knee Immobilizer - Left Knee Immobilizer - Left: On at all times Restrictions Weight Bearing Restrictions: Yes LLE Weight Bearing: Non weight bearing      Mobility  Bed Mobility Overal bed mobility: Needs Assistance;+2 for physical assistance Bed Mobility: Supine to Sit     Supine to sit: Mod assist;+2 for physical assistance;HOB elevated     General bed mobility comments: Mod assist +2 for trunk control and to assist LLE off of bed. Assist needed to scoot forward. VCs for technique. Pt able to use bed rail to assist.  Transfers Overall transfer level: Needs assistance Equipment used: Rolling walker (2 wheeled) Transfers: Sit to/from [employer] Sit to Stand: Mod assist;+2 physical assistance Stand pivot transfers: Mod assist;+2 physical assistance       General transfer comment: Mod assist +2 for sit<>stand and stand pivot transfer to chair from bed towards right side. Physical assist needed to maintain NWB on LLE intermittently. She was unable to effectively use UEs, even with verbal cues, to take a step with  RLE or pivot. Unsafely attempted to sit during transfer. Educated to follow PT instructions for safety.  Ambulation/Gait                Stairs            Wheelchair Mobility    Modified Rankin (Stroke Patients Only)       Balance Overall balance assessment: Needs assistance Sitting-balance support: No upper extremity supported;Feet supported Sitting balance-Leahy Scale: Fair     Standing balance support: Bilateral upper extremity supported Standing balance-Leahy Scale: Poor                               Pertinent Vitals/Pain Pain 7/10 States nursing has recently given her pain medication Patient repositioned in chair for comfort.     Home Living Family/patient expects to be discharged to:: Skilled nursing facility Living Arrangements: Spouse/significant other Available Help at Discharge: Skilled Nursing Facility Type of Home: House Home Access: Level entry     Home Layout: One level Home Equipment: Environmental consultant - 2 wheels;Wheelchair - manual      Prior Function Level of Independence: Needs assistance   Gait / Transfers Assistance Needed: Pt reports she is able to ambulate with RW but typically uses wheelchair  ADL's / Homemaking Assistance Needed: Husband must assist with all ADLs        Hand Dominance        Extremity/Trunk Assessment   Upper Extremity Assessment: Defer to OT evaluation           Lower Extremity Assessment: LLE deficits/detail  LLE Deficits / Details: decreased strength and ROM as expected post-op. Limited assesssment due to immobilization     Communication   Communication: HOH  Cognition Arousal/Alertness: Awake/alert Behavior During Therapy: WFL for tasks assessed/performed Overall Cognitive Status: No family/caregiver present to determine baseline cognitive functioning       Memory: Decreased short-term memory;Decreased recall of precautions              General Comments      Exercises  General Exercises - Lower Extremity Ankle Circles/Pumps: AROM;Both;10 reps;Seated      Assessment/Plan    PT Assessment Patient needs continued PT services  PT Diagnosis Difficulty walking;Acute pain;Generalized weakness   PT Problem List Decreased strength;Decreased activity tolerance;Decreased range of motion;Decreased balance;Decreased mobility;Decreased cognition;Decreased knowledge of use of DME;Decreased safety awareness;Decreased knowledge of precautions;Cardiopulmonary status limiting activity;Pain  PT Treatment Interventions DME instruction;Gait training;Functional mobility training;Therapeutic activities;Therapeutic exercise;Balance training;Neuromuscular re-education;Cognitive remediation;Patient/family education;Wheelchair mobility training;Modalities   PT Goals (Current goals can be found in the Care Plan section) Acute Rehab PT Goals Patient Stated Goal: go home PT Goal Formulation: With patient Time For Goal Achievement: 02/01/14 Potential to Achieve Goals: Good    Frequency Min 3X/week   Barriers to discharge Decreased caregiver support Pt husband with hx of stroke - unable to safely care for patient - Per patient    Co-evaluation               End of Session Equipment Utilized During Treatment: Left knee immobilizer Activity Tolerance: Patient tolerated treatment well Patient left: in chair;with call bell/phone within reach Nurse Communication: Mobility status;Weight bearing status         Time: 1007-1027 PT Time Calculation (min): 20 min   Charges:   PT Evaluation $Initial PT Evaluation Tier I: 1 Procedure PT Treatments $Therapeutic Activity: 8-22 mins   PT G Codes:        Charlsie Merles, [place] [phone]   Berton Mount 01/25/2014, 11:26 AM

## 2014-01-25 NOTE — Progress Notes (Signed)
[facility]  Patient Status: Active (receiving services up to time of hospitalization)  [facility] is providing the following services: RN - monthly visits for CAPS  If patient discharges after hours, please call [phone].   Jodene Nam 01/25/2014, 3:37 PM

## 2014-01-25 NOTE — Progress Notes (Signed)
Subjective:  Patient reports pain as mild  Objective:   VITALS:   Filed Vitals:   01/25/14 0354 01/25/14 0615 01/25/14 0618 01/25/14 0705  BP:  90/54 98/38 144/38  Pulse:  87    Temp: 99.6 F (37.6 C) 99.4 F (37.4 C)    TempSrc: Oral Oral    Resp:  16    Height:      Weight:      SpO2:  97%      Physical Exam   Dressing: C/D/I  Compartments soft Global decreased sensation 2/2 neuropathy, 2+DP, +TA/GS/EHL  LABS  Results for orders placed during the hospital encounter of 01/23/14 (from the past 24 hour(s))  PROTIME-INR     Status: None   Collection Time    01/24/14  8:35 AM      Result Value Ref Range   Prothrombin Time 14.4  11.6 - 15.2 seconds   INR 1.12  0.00 - 1.49  BASIC METABOLIC PANEL     Status: Abnormal   Collection Time    01/24/14  8:35 AM      Result Value Ref Range   Sodium 141  137 - 147 mEq/L   Potassium 4.0  3.7 - 5.3 mEq/L   Chloride 104  96 - 112 mEq/L   CO2 25  19 - 32 mEq/L   Glucose, Bld 100 (*) 70 - 99 mg/dL   BUN 19  6 - 23 mg/dL   Creatinine, Ser 3.89 (*) 0.50 - 1.10 mg/dL   Calcium 8.5  8.4 - 37.3 mg/dL   GFR calc non Af Amer 39 (*) >90 mL/min   GFR calc Af Amer 45 (*) >90 mL/min   Anion gap 12  5 - 15  SURGICAL PCR SCREEN     Status: Abnormal   Collection Time    01/24/14 10:21 AM      Result Value Ref Range   MRSA, PCR NEGATIVE  NEGATIVE   Staphylococcus aureus POSITIVE (*) NEGATIVE  GLUCOSE, CAPILLARY     Status: None   Collection Time    01/24/14 11:38 AM      Result Value Ref Range   Glucose-Capillary 81  70 - 99 mg/dL  GLUCOSE, CAPILLARY     Status: Abnormal   Collection Time    01/24/14  2:59 PM      Result Value Ref Range   Glucose-Capillary 103 (*) 70 - 99 mg/dL   Comment 1 Documented in Chart     Comment 2 Notify RN    GLUCOSE, CAPILLARY     Status: Abnormal   Collection Time    01/24/14  5:04 PM      Result Value Ref Range   Glucose-Capillary 119 (*) 70 - 99 mg/dL  GLUCOSE, CAPILLARY     Status:  Abnormal   Collection Time    01/24/14  9:48 PM      Result Value Ref Range   Glucose-Capillary 103 (*) 70 - 99 mg/dL  BASIC METABOLIC PANEL     Status: Abnormal   Collection Time    01/25/14 12:02 AM      Result Value Ref Range   Sodium 140  137 - 147 mEq/L   Potassium 3.9  3.7 - 5.3 mEq/L   Chloride 101  96 - 112 mEq/L   CO2 22  19 - 32 mEq/L   Glucose, Bld 129 (*) 70 - 99 mg/dL   BUN 17  6 - 23 mg/dL   Creatinine, Ser 4.28 (*)  0.50 - 1.10 mg/dL   Calcium 8.4  8.4 - 93.7 mg/dL   GFR calc non Af Amer 41 (*) >90 mL/min   GFR calc Af Amer 48 (*) >90 mL/min   Anion gap 17 (*) 5 - 15  CBC     Status: Abnormal   Collection Time    01/25/14 12:02 AM      Result Value Ref Range   WBC 12.7 (*) 4.0 - 10.5 K/uL   RBC 3.60 (*) 3.87 - 5.11 MIL/uL   Hemoglobin 9.3 (*) 12.0 - 15.0 g/dL   HCT 34.2 (*) 87.6 - 81.1 %   MCV 82.8  78.0 - 100.0 fL   MCH 25.8 (*) 26.0 - 34.0 pg   MCHC 31.2  30.0 - 36.0 g/dL   RDW 57.2  62.0 - 35.5 %   Platelets 152  150 - 400 K/uL  GLUCOSE, CAPILLARY     Status: Abnormal   Collection Time    01/25/14  6:30 AM      Result Value Ref Range   Glucose-Capillary 114 (*) 70 - 99 mg/dL     Assessment/Plan: 1 Day Post-Op   Principal Problem:   Femur fracture, left Active Problems:   HYPERLIPIDEMIA, MIXED   Essential hypertension, benign   CORONARY ATHEROSCLEROSIS NATIVE CORONARY ARTERY   Aortic valve disorders   Mitral valve disorders   Syncope   Diabetes   UTI (urinary tract infection)   PLAN: Weight Bearing: NWB Knee immobilizer full time Dressings: C/D/I VTE prophylaxis: SCd's, lovenox per primary Dispo: ok for d/c to snf from ortho standpoint   F/u with me in 10-14 days   Tobenna Needs, D 01/25/2014, 8:19 AM   Margarita Rana, MD Cell [phone]

## 2014-01-26 LAB — GLUCOSE, CAPILLARY
GLUCOSE-CAPILLARY: 174 mg/dL — AB (ref 70–99)
Glucose-Capillary: 115 mg/dL — ABNORMAL HIGH (ref 70–99)
Glucose-Capillary: 133 mg/dL — ABNORMAL HIGH (ref 70–99)
Glucose-Capillary: 99 mg/dL (ref 70–99)

## 2014-01-26 LAB — URINE CULTURE: Colony Count: >=100000

## 2014-01-26 LAB — CBC
HCT: 26.1 % — ABNORMAL LOW (ref 36.0–46.0)
Hemoglobin: 8.2 g/dL — ABNORMAL LOW (ref 12.0–15.0)
MCH: 25.4 pg — ABNORMAL LOW (ref 26.0–34.0)
MCHC: 31.4 g/dL (ref 30.0–36.0)
MCV: 80.8 fL (ref 78.0–100.0)
Platelets: 125 10*3/uL — ABNORMAL LOW (ref 150–400)
RBC: 3.23 MIL/uL — ABNORMAL LOW (ref 3.87–5.11)
RDW: 15.3 % (ref 11.5–15.5)
WBC: 11.6 10*3/uL — AB (ref 4.0–10.5)

## 2014-01-26 NOTE — Progress Notes (Signed)
Patient Demographics  Brenda Mcdonald, is a 73 y.o. female, DOB - Feb 21, 1941, ZOX:[id number]  Admit date - 01/23/2014   Admitting Physician Meredeth Ide, MD  Outpatient Primary MD for the patient is VYAS,DHRUV B., MD  LOS - 3   Chief Complaint  Patient presents with  . Knee Injury  . Fall       Subjective:   [name] today has, No headache, No chest pain, No abdominal pain - No Nausea, No new weakness tingling or numbness, No Cough - SOB. Mild dull left leg pain.    Assessment & Plan    Left femur fracture   After mechanical fall at home, she is wheelchair-bound at baseline. Post reduction internal fixation on 01-24-2014 by Dr. Marcial Pacas. Eulah Pont. On Lovenox for DVT prophylaxis, commence PT will require placement. Nonweightbearing on left leg per orthopedics.     Postop blood loss related anemia.   Monitor. Goal to keep hemoglobin above 8.      Fever night of 01-24-2014, likely due to UTI which was present upon admission  Did have UTI upon admission and was on Rocephin, chest x-ray does not show any pneumonia or signs of aspiration, she appears nontoxic. Will switch antibiotic from Rocephin to meropenem in case she has ESBL UTI. Blood cultures if temperature is over 100.4. Add IS. Monitor. Will have speech evaluate one time as family reports some coughing with oral diet in the past      Diabetes mellitus  For now continue sliding scale insulin. Will check CBGs every 6 hours.  CBG (last 3)   Recent Labs  01/25/14 1148 01/25/14 1640 01/26/14 0812  GLUCAP 124* 105* 133*        COPD  Currently stable, no shortness of breath. We'll continue DuoNeb nebulizers every 6 hours when necessary for shortness of breath.      History of nonobstructive CAD with old left bundle  branch block, EF in 20 1255-60%. With moderate aortic valve and mitral valve regurgitation  Patient has a history of nonobstructive CAD, currently on beta blockers. We'll continue metoprolol for 12.5 g by mouth twice a day. Daily aspirin and as needed IV Lopressor ordered. Symptom-free from this standpoint. On ACE inhibitor which will be continued.     Bipolar disorder with mild dementia.  Continue Seroquel, Zoloft and Xanax twice a day. Is at risk for delirium. Family explained and educated.      CKD 3    Creatinine appears to be close to baseline which is around 1.3.      Code Status: DO NOT RESUSCITATE  Family Communication: Son and daughter bedside  Disposition Plan: SNF   Procedures left femoral open reduction internal fixation by Dr. Margarita Rana on 01-24-2014   Consults  Ortho   Medications  Scheduled Meds: . ALPRAZolam  0.25 mg Oral QHS  . aspirin  81 mg Oral Daily  . atorvastatin  10 mg Oral QHS  . darifenacin  7.5 mg Oral Daily  . enoxaparin (LOVENOX) injection  40 mg Subcutaneous Daily  . insulin aspart  0-9 Units Subcutaneous TID WC  . lamoTRIgine  200 mg Oral Daily  . lisinopril  10 mg Oral Daily  . meropenem (MERREM) IV  1 g Intravenous Q12H  .  metoprolol tartrate  12.5 mg Oral BID  . pantoprazole  40 mg Oral Daily  . QUEtiapine  25 mg Oral QHS  . sertraline  100 mg Oral BID   Continuous Infusions:   PRN Meds:.acetaminophen, acetaminophen, albuterol, HYDROcodone-acetaminophen, ipratropium-albuterol, metoprolol, morphine injection  DVT Prophylaxis  Lovenox    Lab Results  Component Value Date   PLT 125* 01/26/2014    Antibiotics     Anti-infectives   Start     Dose/Rate Route Frequency Ordered Stop   01/25/14 1000  meropenem (MERREM) 1 g in sodium chloride 0.9 % 100 mL IVPB     1 g 200 mL/hr over 30 Minutes Intravenous Every 12 hours 01/25/14 0859     01/24/14 1930  ceFAZolin (ANCEF) IVPB 2 g/50 mL premix     2 g 100 mL/hr over 30  Minutes Intravenous Every 6 hours 01/24/14 1657 01/25/14 0421   01/24/14 0830  ciprofloxacin (CIPRO) IVPB 400 mg  Status:  Discontinued     400 mg 200 mL/hr over 60 Minutes Intravenous 2 times daily 01/24/14 0727 01/24/14 0745   01/24/14 0800  cefTRIAXone (ROCEPHIN) 1 g in dextrose 5 % 50 mL IVPB  Status:  Discontinued     1 g 100 mL/hr over 30 Minutes Intravenous Every 24 hours 01/24/14 0745 01/25/14 0848          Objective:   Filed Vitals:   01/25/14 1357 01/25/14 1600 01/25/14 2143 01/26/14 0541  BP: 104/33  141/48 140/64  Pulse: 66  74 86  Temp: 97.7 F (36.5 C)  98 F (36.7 C) 98 F (36.7 C)  TempSrc: Oral  Oral Oral  Resp: 18 18 18 18   Height:      Weight:      SpO2: 98% 96% 99% 99%    Wt Readings from Last 3 Encounters:  01/24/14 86.7 kg (191 lb 2.2 oz)  01/24/14 86.7 kg (191 lb 2.2 oz)  01/24/14 86.7 kg (191 lb 2.2 oz)     Intake/Output Summary (Last 24 hours) at 01/26/14 0857 Last data filed at 01/26/14 0541  Gross per 24 hour  Intake    240 ml  Output      0 ml  Net    240 ml     Physical Exam  Awake , looks tiered, Oriented X 2, No new F.N deficits, Normal affect [place].AT,PERRAL Supple Neck,No JVD, No cervical lymphadenopathy appriciated.  Symmetrical Chest wall movement, Good air movement bilaterally, CTAB RRR,No Gallops,Rubs or new Murmurs, No Parasternal Heave +ve B.Sounds, Abd Soft, No tenderness, No organomegaly appriciated, No rebound - guarding or rigidity. No Cyanosis, Clubbing or edema, No new Rash or bruise, L Knee in immobilizer   Data Review   Micro Results Recent Results (from the past 240 hour(s))  URINE CULTURE     Status: None   Collection Time    01/24/14  7:36 AM      Result Value Ref Range Status   Specimen Description URINE, CATHETERIZED   Final   Special Requests none   Final   Culture  Setup Time     Final   Value: 01/24/2014 14:53     Performed at 03/27/2014 Count     Final   Value: >=100,000  COLONIES/ML     Performed at [facility]   Culture     Final   Value: ESCHERICHIA COLI     Performed at [facility]   Report Status PENDING  Incomplete  SURGICAL PCR SCREEN     Status: Abnormal   Collection Time    01/24/14 10:21 AM      Result Value Ref Range Status   MRSA, PCR NEGATIVE  NEGATIVE Final   Staphylococcus aureus POSITIVE (*) NEGATIVE Final   Comment:            The Xpert SA Assay (FDA     approved for NASAL specimens     in patients over 73 years of age),     is one component of     a comprehensive surveillance     program.  Test performance has     been validated by [employer] for patients greater     than or equal to 24 year old.     It is not intended     to diagnose infection nor to     guide or monitor treatment.    Radiology Reports Dg Chest 1 View  01/23/2014   CLINICAL DATA:  Distal femur fracture.  EXAM: CHEST - 1 VIEW  COMPARISON:  07/07/2012  FINDINGS: The heart size appears mildly enlarged. No pleural effusion or edema. There is no airspace consolidation identified. There is a calcified granuloma in the right lower lobe. Calcified atherosclerotic plaque is noted within the aortic arch.  IMPRESSION: 1. No acute cardiopulmonary abnormalities. 2. Atherosclerotic disease.   Electronically Signed   By: Signa Kell M.D.   On: 01/23/2014 19:28   Dg Pelvis 1-2 Views  01/23/2014   CLINICAL DATA:  Fall, distal femur fracture  EXAM: PELVIS - 1-2 VIEW  COMPARISON:  None.  FINDINGS: No fracture or dislocation is seen.  Mild degenerative changes of the bilateral hips.  Visualized bony pelvis appears intact.  Degenerative changes the lower lumbar spine.  Vascular calcifications.  IMPRESSION: No fracture or dislocation is seen.   Electronically Signed   By: Charline Bills M.D.   On: 01/23/2014 19:29   Dg Femur Left  01/23/2014   CLINICAL DATA:  Fracture of the distal femur.  EXAM: LEFT FEMUR - 2 VIEW  COMPARISON:  None  FINDINGS: The patient is  status post left total knee arthroplasty. There is a comminuted distal metaphysis heel fracture involving the left femur. There is posterior displacement of the distal fracture fragments by 1/2 shaft's width. There is also mild lateral angulation of the distal fracture fragments.  IMPRESSION: 1. Comminuted fracture involves the distal femur.   Electronically Signed   By: Signa Kell M.D.   On: 01/23/2014 19:29   Dg Knee Complete 4 Views Left  01/23/2014   CLINICAL DATA:  Left knee pain after a fall.  EXAM: LEFT KNEE - COMPLETE 4+ VIEW  COMPARISON:  None.  FINDINGS: There is a mildly comminuted fracture of the distal femoral metadiaphysis, involving the superior margin of the left total knee arthroplasty. Apex and medial and volar angulation of the fracture fragments. Bones appear osteopenic.  IMPRESSION: Distal left femur fracture associated with a left total knee arthroplasty.   Electronically Signed   By: Leanna Battles M.D.   On: 01/23/2014 18:58    CBC  Recent Labs Lab 01/23/14 1935 01/24/14 0400 01/25/14 0002 01/26/14 0424  WBC 10.4 10.4 12.7* 11.6*  HGB 10.9* 8.9* 9.3* 8.2*  HCT 33.8* 28.2* 29.8* 26.1*  PLT 173 150 152 125*  MCV 79.0 79.7 82.8 80.8  MCH 25.5* 25.1* 25.8* 25.4*  MCHC 32.2 31.6 31.2 31.4  RDW 14.7 14.8 15.0 15.3  LYMPHSABS 1.3  --   --   --   MONOABS 0.7  --   --   --   EOSABS 0.1  --   --   --   BASOSABS 0.0  --   --   --     Chemistries   Recent Labs Lab 01/23/14 1935 01/24/14 0400 01/24/14 0835 01/25/14 0002  NA 139 140 141 140  K 4.0 4.0 4.0 3.9  CL 99 102 104 101  CO2 26 24 25 22   GLUCOSE 149* 123* 100* 129*  BUN 23 20 19 17   CREATININE 1.36* 1.29* 1.33* 1.26*  CALCIUM 9.1 8.5 8.5 8.4  AST  --  12  --   --   ALT  --  8  --   --   ALKPHOS  --  76  --   --   BILITOT  --  0.3  --   --    ------------------------------------------------------------------------------------------------------------------ estimated creatinine clearance is 44.2  ml/min (by C-G formula based on Cr of 1.26). ------------------------------------------------------------------------------------------------------------------ No results found for this basename: HGBA1C,  in the last 72 hours ------------------------------------------------------------------------------------------------------------------ No results found for this basename: CHOL, HDL, LDLCALC, TRIG, CHOLHDL, LDLDIRECT,  in the last 72 hours ------------------------------------------------------------------------------------------------------------------ No results found for this basename: TSH, T4TOTAL, FREET3, T3FREE, THYROIDAB,  in the last 72 hours ------------------------------------------------------------------------------------------------------------------ No results found for this basename: VITAMINB12, FOLATE, FERRITIN, TIBC, IRON, RETICCTPCT,  in the last 72 hours  Coagulation profile  Recent Labs Lab 01/24/14 0835  INR 1.12    No results found for this basename: DDIMER,  in the last 72 hours  Cardiac Enzymes No results found for this basename: CK, CKMB, TROPONINI, MYOGLOBIN,  in the last 168 hours ------------------------------------------------------------------------------------------------------------------ No components found with this basename: POCBNP,      Time Spent in minutes  35   SINGH,PRASHANT K M.D on 01/26/2014 at 8:57 AM  Between 7am to 7pm - Pager - [phone]  After 7pm go to [email or web address] - password [email or web address]  And look for the night coverage person covering for me after hours  [facility] Office  [phone]   **Disclaimer: This note may have been dictated with voice recognition software. Similar sounding words can inadvertently be transcribed and this note may contain transcription errors which may not have been corrected upon publication of note.**

## 2014-01-26 NOTE — Progress Notes (Signed)
Clinical Social Work Department BRIEF PSYCHOSOCIAL ASSESSMENT 01/26/2014  Patient:  Brenda Mcdonald, Brenda Mcdonald     Account Number:  [id number]     Admit date:  01/23/2014  Clinical Social Worker:  Rolinda Roan  Date/Time:  01/26/2014 05:32 PM  Referred by:  Physician  Date Referred:  01/26/2014 Referred for  SNF Placement   Other Referral:   Interview type:  Family Other interview type:    PSYCHOSOCIAL DATA Living Status:  HUSBAND Admitted from facility:   Level of care:   Primary support name:  Brenda Mcdonald [phone] Primary support relationship to patient:  FAMILY Degree of support available:   Brenda Mcdonald is patient's daughter in law, was at bed side and showed good support.    CURRENT CONCERNS  Other Concerns:    SOCIAL WORK ASSESSMENT / PLAN Clinical Social Worker (CSW) met with patient and her husband Brenda Mcdonald, son Brenda Mcdonald, and daughter in law Brenda Mcdonald were at bedside. Patient appeared confused and could not participate in the assessment. Per daughter in law patient and husband live in [place]. Daughter in Sports coach and son live in [place], [place]. Per family patient's first choice of SNF is [facility] and second choice is [facility]. CSW completed FL2 and initiated SNF search.   Assessment/plan status:  Psychosocial Support/Ongoing Assessment of Needs Other assessment/ plan:   Information/referral to community resources:    PATIENT'S/FAMILY'S RESPONSE TO PLAN OF CARE: Daughter in Sports coach, son, and husband thanked CSW for visit and assisting with placement process.

## 2014-01-26 NOTE — Progress Notes (Signed)
Patient ID: Brenda Mcdonald, female   DOB: September 28, 1940, 73 y.o.   MRN: [id number] PATIENT ID: Brenda Mcdonald        MRN:  [id number]          DOB/AGE: [birth year] / 73 y.o.     PROGRESS NOTE  Subjective:  negative for Chest Pain  negative for Shortness of Breath  negative for Nausea/Vomiting   negative for Calf Pain    Tolerating Diet: yes         Patient reports pain as mild and moderate.       Objective: Vital signs in last 24 hours:   Patient Vitals for the past 24 hrs:  BP Temp Temp src Pulse Resp SpO2  01/26/14 0800 - - - - 18 -  01/26/14 0541 140/64 mmHg 98 F (36.7 C) Oral 86 18 99 %  01/25/14 2143 141/48 mmHg 98 F (36.7 C) Oral 74 18 99 %  01/25/14 1600 - - - - 18 96 %  01/25/14 1357 104/33 mmHg 97.7 F (36.5 C) Oral 66 18 98 %  01/25/14 1158 - - - - 18 94 %      Intake/Output from previous day:   07/03 0701 - 07/04 0700 In: 240 [P.O.:240] Out: -    Intake/Output this shift:       Intake/Output     07/03 0701 - 07/04 0700 07/04 0701 - 07/05 0700   P.O. 240    I.V. (mL/kg)     Total Intake(mL/kg) 240 (2.8)    Urine (mL/kg/hr)     Total Output       Net +240          Urine Occurrence 2 x       LABORATORY DATA:  Recent Labs  01/23/14 1935 01/24/14 0400 01/25/14 0002 01/26/14 0424  WBC 10.4 10.4 12.7* 11.6*  HGB 10.9* 8.9* 9.3* 8.2*  HCT 33.8* 28.2* 29.8* 26.1*  PLT 173 150 152 125*    Recent Labs  01/23/14 1935 01/24/14 0400 01/24/14 0835 01/25/14 0002  NA 139 140 141 140  K 4.0 4.0 4.0 3.9  CL 99 102 104 101  CO2 26 24 25 22   BUN 23 20 19 17   CREATININE 1.36* 1.29* 1.33* 1.26*  GLUCOSE 149* 123* 100* 129*  CALCIUM 9.1 8.5 8.5 8.4   Lab Results  Component Value Date   INR 1.12 01/24/2014    Recent Radiographic Studies :  Dg Chest 1 View  01/23/2014   CLINICAL DATA:  Distal femur fracture.  EXAM: CHEST - 1 VIEW  COMPARISON:  07/07/2012  FINDINGS: The heart size appears mildly enlarged. No pleural effusion or edema. There is no  airspace consolidation identified. There is a calcified granuloma in the right lower lobe. Calcified atherosclerotic plaque is noted within the aortic arch.  IMPRESSION: 1. No acute cardiopulmonary abnormalities. 2. Atherosclerotic disease.   Electronically Signed   By: 03/26/2014 M.D.   On: 01/23/2014 19:28   Dg Pelvis 1-2 Views  01/23/2014   CLINICAL DATA:  Fall, distal femur fracture  EXAM: PELVIS - 1-2 VIEW  COMPARISON:  None.  FINDINGS: No fracture or dislocation is seen.  Mild degenerative changes of the bilateral hips.  Visualized bony pelvis appears intact.  Degenerative changes the lower lumbar spine.  Vascular calcifications.  IMPRESSION: No fracture or dislocation is seen.   Electronically Signed   By: 03/26/2014 M.D.   On: 01/23/2014 19:29   Dg Femur Left  01/24/2014  CLINICAL DATA:  Left femur fracture.  EXAM: DG C-ARM 61-120 MIN; LEFT FEMUR - 2 VIEW  COMPARISON:  January 23, 2014.  FINDINGS: Four intraoperative fluoroscopic images of the distal left femur were submitted for review. Status post left total knee arthroplasty. Patient has undergone internal fixation of comminuted distal left femoral fracture with significantly improved alignment of fracture components compared to prior exam.  IMPRESSION: Status post internal fixation of comminuted distal left femoral fracture.   Electronically Signed   By: Roque Lias M.D.   On: 01/24/2014 15:07   Dg Femur Left  01/23/2014   CLINICAL DATA:  Fracture of the distal femur.  EXAM: LEFT FEMUR - 2 VIEW  COMPARISON:  None  FINDINGS: The patient is status post left total knee arthroplasty. There is a comminuted distal metaphysis heel fracture involving the left femur. There is posterior displacement of the distal fracture fragments by 1/2 shaft's width. There is also mild lateral angulation of the distal fracture fragments.  IMPRESSION: 1. Comminuted fracture involves the distal femur.   Electronically Signed   By: Signa Kell M.D.   On:  01/23/2014 19:29   Dg Knee 1-2 Views Left  01/24/2014   CLINICAL DATA:  Distal left femoral fracture.  EXAM: LEFT KNEE - 1-2 VIEW  COMPARISON:  January 23, 2014.  FINDINGS: Status post internal fixation of comminuted distal left femoral fracture with improved alignment of fracture components compared to prior exam. Status post left total knee arthroplasty which was present previously.  IMPRESSION: Status post internal fixation of comminuted distal left femoral fracture.   Electronically Signed   By: Roque Lias M.D.   On: 01/24/2014 15:36   Dg Chest Port 1 View  01/25/2014   CLINICAL DATA:  Fever and weakness  EXAM: PORTABLE CHEST - 1 VIEW  COMPARISON:  01/23/2014  FINDINGS: Cardiac shadow is stable. A calcified granuloma is again noted in the right base. No focal infiltrate or sizable effusion is seen. No bony abnormality is noted.  IMPRESSION: No acute abnormality seen.  These results were called by telephone at the time of interpretation on 01/25/2014 at 7:43 AM to Clydie Braun, the patients nurse, who verbally acknowledged these results.   Electronically Signed   By: Alcide Clever M.D.   On: 01/25/2014 07:45   Dg Knee Complete 4 Views Left  01/23/2014   CLINICAL DATA:  Left knee pain after a fall.  EXAM: LEFT KNEE - COMPLETE 4+ VIEW  COMPARISON:  None.  FINDINGS: There is a mildly comminuted fracture of the distal femoral metadiaphysis, involving the superior margin of the left total knee arthroplasty. Apex and medial and volar angulation of the fracture fragments. Bones appear osteopenic.  IMPRESSION: Distal left femur fracture associated with a left total knee arthroplasty.   Electronically Signed   By: Leanna Battles M.D.   On: 01/23/2014 18:58   Dg C-arm 1-60 Min  01/24/2014   CLINICAL DATA:  Left femur fracture.  EXAM: DG C-ARM 61-120 MIN; LEFT FEMUR - 2 VIEW  COMPARISON:  January 23, 2014.  FINDINGS: Four intraoperative fluoroscopic images of the distal left femur were submitted for review. Status post left total  knee arthroplasty. Patient has undergone internal fixation of comminuted distal left femoral fracture with significantly improved alignment of fracture components compared to prior exam.  IMPRESSION: Status post internal fixation of comminuted distal left femoral fracture.   Electronically Signed   By: Roque Lias M.D.   On: 01/24/2014 15:07     Examination:  General appearance: alert and cooperative  Wound Exam: clean, dry, intact dressing  Assessment:    2 Days Post-Op  Procedure(s) (LRB): OPEN REDUCTION INTERNAL FIXATION (ORIF) DISTAL FEMUR FRACTURE (Left)  ADDITIONAL DIAGNOSIS:  Principal Problem:   Femur fracture, left Active Problems:   HYPERLIPIDEMIA, MIXED   Essential hypertension, benign   CORONARY ATHEROSCLEROSIS NATIVE CORONARY ARTERY   Aortic valve disorders   Mitral valve disorders   Syncope   Diabetes   UTI (urinary tract infection)  Neuropathy   Plan: Physical Therapy as ordered Non Weight Bearing (NWB)  DVT Prophylaxis:  Aspirin        Brenda Mcdonald 01/26/2014 10:05 AM

## 2014-01-26 NOTE — Progress Notes (Signed)
Clinical Social Work Department CLINICAL SOCIAL WORK PLACEMENT NOTE 01/26/2014  Patient:  RENLEE, FLOOR  Account Number:  [id number] Admit date:  01/23/2014  Clinical Social Worker:  Jetta Lout, Theresia Majors  Date/time:  01/26/2014 05:38 PM  Clinical Social Work is seeking post-discharge placement for this patient at the following level of care:   SKILLED NURSING   (*CSW will update this form in Epic as items are completed)   01/26/2014  Patient/family provided with [facility] Department of Clinical Social Work's list of facilities offering this level of care within the geographic area requested by the patient (or if unable, by the patient's family).  01/26/2014  Patient/family informed of their freedom to choose among providers that offer the needed level of care, that participate in Medicare, Medicaid or managed care program needed by the patient, have an available bed and are willing to accept the patient.  01/26/2014  Patient/family informed of [facility]' ownership interest in [facility], as well as of the fact that they are under no obligation to receive care at this facility.  PASARR submitted to EDS on 01/25/2014 PASARR number received on   FL2 transmitted to all facilities in geographic area requested by pt/family on  01/26/2014 FL2 transmitted to all facilities within larger geographic area on   Patient informed that his/her managed care company has contracts with or will negotiate with  certain facilities, including the following:     Patient/family informed of bed offers received:   Patient chooses bed at  Physician recommends and patient chooses bed at    Patient to be transferred to  on   Patient to be transferred to facility by  Patient and family notified of transfer on  Name of family member notified:    The following physician request were entered in Epic:   Additional Comments: Patient's PASARR is expired. A new PASARR has been submitted and  screen is still currently running.

## 2014-01-26 NOTE — Progress Notes (Signed)
Speech Language Pathology Treatment: Dysphagia  Patient Details Name: PEGGY LOGE MRN: [id number] DOB: 1941-05-08 Today's Date: 01/26/2014 Time: 1100-1115 SLP Time Calculation (min): 15 min  Assessment / Plan / Recommendation Clinical Impression  Skilled observation of various po trials to consider upgrade to Dysphagia 3/thin (pt's previous diet); pt exhibited decreased sustained attention, intermittent lethargy and prolonged mastication with solids with minimal cueing required for intake of liquids/solids; continue current diet and consider possible upgrade to Dysphagia 3; nursing informed of diet recommendation and possibility of upgrade; nursing had no concerns with medication administration; pt's dentures were not available during dysphagia tx which may have impacted mastication of solids; it should be noted pt did state "I get choked sometimes at home" when drinking liquids, but no s/s of aspiration noted during dysphagia tx with liquids via straw or puree/solids   HPI HPI: 73 y.o. female s/p ORIF of distal femur fracture. Hx of COPD, TIA, and HTN. Family reported occasional coughing at home with PO, and therefore SLP swallowing evaluation was ordered.   Pertinent Vitals Afebrile; no c/o pain  SLP Plan  Continue with current plan of care    Recommendations Diet recommendations: Dysphagia 2 (fine chop);Thin liquid Liquids provided via: Straw;Cup Medication Administration: Whole meds with liquid Supervision: Patient able to self feed;Intermittent supervision to cue for compensatory strategies Compensations: Slow rate;Small sips/bites Postural Changes and/or Swallow Maneuvers: Seated upright 90 degrees;Upright 30-60 min after meal              Oral Care Recommendations: Oral care BID Follow up Recommendations: Other (comment) Plan: Continue with current plan of care         Danyia Borunda,PAT, M.S., CCC-SLP 01/26/2014, 11:19 AM

## 2014-01-27 LAB — PREPARE RBC (CROSSMATCH)

## 2014-01-27 LAB — CBC
HCT: 23.9 % — ABNORMAL LOW (ref 36.0–46.0)
HEMOGLOBIN: 7.6 g/dL — AB (ref 12.0–15.0)
MCH: 25.5 pg — ABNORMAL LOW (ref 26.0–34.0)
MCHC: 31.8 g/dL (ref 30.0–36.0)
MCV: 80.2 fL (ref 78.0–100.0)
Platelets: 131 10*3/uL — ABNORMAL LOW (ref 150–400)
RBC: 2.98 MIL/uL — AB (ref 3.87–5.11)
RDW: 15.3 % (ref 11.5–15.5)
WBC: 9.1 10*3/uL (ref 4.0–10.5)

## 2014-01-27 LAB — FOLATE: Folate: >20 ng/mL

## 2014-01-27 LAB — IRON AND TIBC
IRON: 13 ug/dL — AB (ref 42–135)
SATURATION RATIOS: 6 % — AB (ref 20–55)
TIBC: 229 ug/dL — AB (ref 250–470)
UIBC: 216 ug/dL (ref 125–400)

## 2014-01-27 LAB — RETICULOCYTES
RBC.: 3.09 MIL/uL — ABNORMAL LOW (ref 3.87–5.11)
RETIC CT PCT: 0.9 % (ref 0.4–3.1)
Retic Count, Absolute: 27.8 10*3/uL (ref 19.0–186.0)

## 2014-01-27 LAB — HEMOGLOBIN AND HEMATOCRIT, BLOOD
HCT: 26.7 % — ABNORMAL LOW (ref 36.0–46.0)
HEMOGLOBIN: 8.6 g/dL — AB (ref 12.0–15.0)

## 2014-01-27 LAB — GLUCOSE, CAPILLARY
GLUCOSE-CAPILLARY: 121 mg/dL — AB (ref 70–99)
Glucose-Capillary: 100 mg/dL — ABNORMAL HIGH (ref 70–99)
Glucose-Capillary: 136 mg/dL — ABNORMAL HIGH (ref 70–99)
Glucose-Capillary: 128 mg/dL — ABNORMAL HIGH (ref 70–99)

## 2014-01-27 LAB — FERRITIN: FERRITIN: 141 ng/mL (ref 10–291)

## 2014-01-27 LAB — ABO/RH: ABO/RH(D): A NEG

## 2014-01-27 LAB — VITAMIN B12: Vitamin B-12: 698 pg/mL (ref 211–911)

## 2014-01-27 MED ORDER — CEFTRIAXONE SODIUM 1 G IJ SOLR
1.0000 g | Freq: Once | INTRAMUSCULAR | Status: AC
  Administered 2014-01-27: 1 g via INTRAVENOUS
  Filled 2014-01-27: qty 10

## 2014-01-27 MED ORDER — DIPHENHYDRAMINE HCL 50 MG/ML IJ SOLN: 25.0000 mg | Freq: Four times a day (QID) | INTRAMUSCULAR | Status: DC | PRN

## 2014-01-27 MED ORDER — ASPIRIN 81 MG PO CHEW
324.0000 mg | CHEWABLE_TABLET | Freq: Every day | ORAL | Status: DC
  Administered 2014-01-27 – 2014-01-28 (×2): 324 mg via ORAL
  Filled 2014-01-27 (×2): qty 4

## 2014-01-27 MED ORDER — FUROSEMIDE 10 MG/ML IJ SOLN
10.0000 mg | Freq: Once | INTRAMUSCULAR | Status: AC
  Administered 2014-01-27: 10 mg via INTRAVENOUS

## 2014-01-27 NOTE — Progress Notes (Signed)
Patient Demographics  Brenda BosBarbara Mcdonald, is a 73 y.o. female, DOB - 04-Aug-1940, ZOX:[id number]  Admit date - 01/23/2014   Admitting Physician Meredeth IdeGagan S Lama, MD  Outpatient Primary MD for the patient is VYAS,DHRUV B., MD  LOS - 4   Chief Complaint  Patient presents with  . Knee Injury  . Fall       Subjective:   Brenda BosBarbara Spayd today has, No headache, No chest pain, No abdominal pain - No Nausea, No new weakness tingling or numbness, No Cough - SOB. Mild dull left leg pain.   Assessment & Plan    Left femur fracture   After mechanical fall at home, she is wheelchair-bound at baseline. Post reduction internal fixation on 01-24-2014 by Dr. Marcial Pacasimothy. Eulah PontMurphy. Discussed with Dr. Eulah PontMurphy to be switched to full dose aspirin for DVT prophylaxis, being followed by PT will require placement. Nonweightbearing on left leg per orthopedics.     Postop blood loss related anemia.   We'll give her 1 unit of packed RBC on 01/27/2014 and monitor.      Fever night of 01-24-2014, likely due to UTI which was present upon admission  Did have UTI upon admission, noted cultures Escherichia coli which is pansensitive. We'll give her one more dose of Rocephin and start antibiotics as it will be a total of 3 days of appropriate antibiotic treatment.      Diabetes mellitus  For now continue sliding scale insulin. Will check CBGs every 6 hours.  CBG (last 3)   Recent Labs  01/26/14 1814 01/26/14 2357 01/27/14 0613  GLUCAP 174* 115* 100*       COPD  Currently stable, no shortness of breath. We'll continue DuoNeb nebulizers every 6 hours when necessary for shortness of breath.      History of nonobstructive CAD with old left bundle branch block, EF in 20 1255-60%. With moderate aortic valve and mitral valve  regurgitation - Patient has a history of nonobstructive CAD, currently on beta blockers. We'll continue metoprolol for 12.5 g by mouth twice a day. Daily aspirin and as needed IV Lopressor ordered. Symptom-free from this standpoint. On ACE inhibitor which will be continued.      Bipolar disorder with mild dementia. Continue Seroquel, Zoloft and Xanax twice a day. Is at risk for delirium. Family explained and educated.       CKD 3 Creatinine appears to be close to baseline which is around 1.3.       Code Status: DO NOT RESUSCITATE  Family Communication: Son and daughter bedside  Disposition Plan: SNF   Procedures left femoral open reduction internal fixation by Dr. Margarita Ranaimothy Murphy on 01-24-2014   Consults  Ortho   Medications  Scheduled Meds: . ALPRAZolam  0.25 mg Oral QHS  . aspirin  81 mg Oral Daily  . atorvastatin  10 mg Oral QHS  . darifenacin  7.5 mg Oral Daily  . enoxaparin (LOVENOX) injection  40 mg Subcutaneous Daily  . insulin aspart  0-9 Units Subcutaneous TID WC  . lamoTRIgine  200 mg Oral Daily  . lisinopril  10 mg Oral Daily  . meropenem (MERREM) IV  1 g Intravenous Q12H  . metoprolol tartrate  12.5 mg Oral BID  . pantoprazole  40  mg Oral Daily  . QUEtiapine  25 mg Oral QHS  . sertraline  100 mg Oral BID   Continuous Infusions:   PRN Meds:.acetaminophen, acetaminophen, albuterol, HYDROcodone-acetaminophen, ipratropium-albuterol, metoprolol, morphine injection  DVT Prophylaxis  Lovenox    Lab Results  Component Value Date   PLT 131* 01/27/2014    Antibiotics     Anti-infectives   Start     Dose/Rate Route Frequency Ordered Stop   01/25/14 1000  meropenem (MERREM) 1 g in sodium chloride 0.9 % 100 mL IVPB     1 g 200 mL/hr over 30 Minutes Intravenous Every 12 hours 01/25/14 0859     01/24/14 1930  ceFAZolin (ANCEF) IVPB 2 g/50 mL premix     2 g 100 mL/hr over 30 Minutes Intravenous Every 6 hours 01/24/14 1657 01/25/14 0421   01/24/14 0830   ciprofloxacin (CIPRO) IVPB 400 mg  Status:  Discontinued     400 mg 200 mL/hr over 60 Minutes Intravenous 2 times daily 01/24/14 0727 01/24/14 0745   01/24/14 0800  cefTRIAXone (ROCEPHIN) 1 g in dextrose 5 % 50 mL IVPB  Status:  Discontinued     1 g 100 mL/hr over 30 Minutes Intravenous Every 24 hours 01/24/14 0745 01/25/14 0848          Objective:   Filed Vitals:   01/26/14 1600 01/26/14 2000 01/26/14 2046 01/27/14 0537  BP:   148/64 149/54  Pulse:   86 70  Temp:   98.1 F (36.7 C) 98.1 F (36.7 C)  TempSrc:      Resp: 18 18 18 18   Height:      Weight:      SpO2:  98% 98% 96%    Wt Readings from Last 3 Encounters:  01/24/14 86.7 kg (191 lb 2.2 oz)  01/24/14 86.7 kg (191 lb 2.2 oz)  01/24/14 86.7 kg (191 lb 2.2 oz)     Intake/Output Summary (Last 24 hours) at 01/27/14 0932 Last data filed at 01/26/14 2300  Gross per 24 hour  Intake    460 ml  Output      0 ml  Net    460 ml     Physical Exam  Awake , looks tiered, Oriented X 2, No new F.N deficits, Normal affect [place].AT,PERRAL Supple Neck,No JVD, No cervical lymphadenopathy appriciated.  Symmetrical Chest wall movement, Good air movement bilaterally, CTAB RRR,No Gallops,Rubs or new Murmurs, No Parasternal Heave +ve B.Sounds, Abd Soft, No tenderness, No organomegaly appriciated, No rebound - guarding or rigidity. No Cyanosis, Clubbing or edema, No new Rash or bruise, L Knee in immobilizer   Data Review   Micro Results Recent Results (from the past 240 hour(s))  URINE CULTURE     Status: None   Collection Time    01/24/14  7:36 AM      Result Value Ref Range Status   Specimen Description URINE, CATHETERIZED   Final   Special Requests none   Final   Culture  Setup Time     Final   Value: 01/24/2014 14:53     Performed at [facility] Count     Final   Value: >=100,000 COLONIES/ML     Performed at [facility]   Culture     Final   Value: ESCHERICHIA COLI     Performed at  [facility]   Report Status 01/26/2014 FINAL   Final   Organism ID, Bacteria ESCHERICHIA COLI   Final  SURGICAL  PCR SCREEN     Status: Abnormal   Collection Time    01/24/14 10:21 AM      Result Value Ref Range Status   MRSA, PCR NEGATIVE  NEGATIVE Final   Staphylococcus aureus POSITIVE (*) NEGATIVE Final   Comment:            The Xpert SA Assay (FDA     approved for NASAL specimens     in patients over 103 years of age),     is one component of     a comprehensive surveillance     program.  Test performance has     been validated by [employer] for patients greater     than or equal to 59 year old.     It is not intended     to diagnose infection nor to     guide or monitor treatment.    Radiology Reports Dg Chest 1 View  01/23/2014   CLINICAL DATA:  Distal femur fracture.  EXAM: CHEST - 1 VIEW  COMPARISON:  07/07/2012  FINDINGS: The heart size appears mildly enlarged. No pleural effusion or edema. There is no airspace consolidation identified. There is a calcified granuloma in the right lower lobe. Calcified atherosclerotic plaque is noted within the aortic arch.  IMPRESSION: 1. No acute cardiopulmonary abnormalities. 2. Atherosclerotic disease.   Electronically Signed   By: Signa Kell M.D.   On: 01/23/2014 19:28   Dg Pelvis 1-2 Views  01/23/2014   CLINICAL DATA:  Fall, distal femur fracture  EXAM: PELVIS - 1-2 VIEW  COMPARISON:  None.  FINDINGS: No fracture or dislocation is seen.  Mild degenerative changes of the bilateral hips.  Visualized bony pelvis appears intact.  Degenerative changes the lower lumbar spine.  Vascular calcifications.  IMPRESSION: No fracture or dislocation is seen.   Electronically Signed   By: Charline Bills M.D.   On: 01/23/2014 19:29   Dg Femur Left  01/23/2014   CLINICAL DATA:  Fracture of the distal femur.  EXAM: LEFT FEMUR - 2 VIEW  COMPARISON:  None  FINDINGS: The patient is status post left total knee arthroplasty. There is a  comminuted distal metaphysis heel fracture involving the left femur. There is posterior displacement of the distal fracture fragments by 1/2 shaft's width. There is also mild lateral angulation of the distal fracture fragments.  IMPRESSION: 1. Comminuted fracture involves the distal femur.   Electronically Signed   By: Signa Kell M.D.   On: 01/23/2014 19:29   Dg Knee Complete 4 Views Left  01/23/2014   CLINICAL DATA:  Left knee pain after a fall.  EXAM: LEFT KNEE - COMPLETE 4+ VIEW  COMPARISON:  None.  FINDINGS: There is a mildly comminuted fracture of the distal femoral metadiaphysis, involving the superior margin of the left total knee arthroplasty. Apex and medial and volar angulation of the fracture fragments. Bones appear osteopenic.  IMPRESSION: Distal left femur fracture associated with a left total knee arthroplasty.   Electronically Signed   By: Leanna Battles M.D.   On: 01/23/2014 18:58    CBC  Recent Labs Lab 01/23/14 1935 01/24/14 0400 01/25/14 0002 01/26/14 0424 01/27/14 0400  WBC 10.4 10.4 12.7* 11.6* 9.1  HGB 10.9* 8.9* 9.3* 8.2* 7.6*  HCT 33.8* 28.2* 29.8* 26.1* 23.9*  PLT 173 150 152 125* 131*  MCV 79.0 79.7 82.8 80.8 80.2  MCH 25.5* 25.1* 25.8* 25.4* 25.5*  MCHC 32.2 31.6 31.2 31.4 31.8  RDW 14.7 14.8 15.0 15.3 15.3  LYMPHSABS 1.3  --   --   --   --   MONOABS 0.7  --   --   --   --   EOSABS 0.1  --   --   --   --   BASOSABS 0.0  --   --   --   --     Chemistries   Recent Labs Lab 01/23/14 1935 01/24/14 0400 01/24/14 0835 01/25/14 0002  NA 139 140 141 140  K 4.0 4.0 4.0 3.9  CL 99 102 104 101  CO2 26 24 25 22   GLUCOSE 149* 123* 100* 129*  BUN 23 20 19 17   CREATININE 1.36* 1.29* 1.33* 1.26*  CALCIUM 9.1 8.5 8.5 8.4  AST  --  12  --   --   ALT  --  8  --   --   ALKPHOS  --  76  --   --   BILITOT  --  0.3  --   --    ------------------------------------------------------------------------------------------------------------------ estimated  creatinine clearance is 44.2 ml/min (by C-G formula based on Cr of 1.26). ------------------------------------------------------------------------------------------------------------------ No results found for this basename: HGBA1C,  in the last 72 hours ------------------------------------------------------------------------------------------------------------------ No results found for this basename: CHOL, HDL, LDLCALC, TRIG, CHOLHDL, LDLDIRECT,  in the last 72 hours ------------------------------------------------------------------------------------------------------------------ No results found for this basename: TSH, T4TOTAL, FREET3, T3FREE, THYROIDAB,  in the last 72 hours ------------------------------------------------------------------------------------------------------------------ No results found for this basename: VITAMINB12, FOLATE, FERRITIN, TIBC, IRON, RETICCTPCT,  in the last 72 hours  Coagulation profile  Recent Labs Lab 01/24/14 0835  INR 1.12    No results found for this basename: DDIMER,  in the last 72 hours  Cardiac Enzymes No results found for this basename: CK, CKMB, TROPONINI, MYOGLOBIN,  in the last 168 hours ------------------------------------------------------------------------------------------------------------------ No components found with this basename: POCBNP,      Time Spent in minutes  35   SINGH,PRASHANT K M.D on 01/27/2014 at 9:32 AM  Between 7am to 7pm - Pager - [phone]  After 7pm go to [email or web address] - password [email or web address]  And look for the night coverage person covering for me after hours  [facility] Office  [phone]   **Disclaimer: This note may have been dictated with voice recognition software. Similar sounding words can inadvertently be transcribed and this note may contain transcription errors which may not have been corrected upon publication of note.**

## 2014-01-27 NOTE — Progress Notes (Signed)
Subjective: 3 Days Post-Op Procedure(s) (LRB): OPEN REDUCTION INTERNAL FIXATION (ORIF) DISTAL FEMUR FRACTURE (Left) Patient reports pain as mild and moderate.    Objective: Vital signs in last 24 hours: Temp:  [98.1 F (36.7 C)] 98.1 F (36.7 C) (07/05 0537) Pulse Rate:  [70-86] 70 (07/05 0537) Resp:  [18] 18 (07/05 0537) BP: (135-149)/(54-64) 149/54 mmHg (07/05 0537) SpO2:  [95 %-98 %] 96 % (07/05 0537)  Intake/Output from previous day: 07/04 0701 - 07/05 0700 In: 580 [P.O.:580] Out: -  Intake/Output this shift:     Recent Labs  01/25/14 0002 01/26/14 0424 01/27/14 0400  HGB 9.3* 8.2* 7.6*    Recent Labs  01/26/14 0424 01/27/14 0400  WBC 11.6* 9.1  RBC 3.23* 2.98*  HCT 26.1* 23.9*  PLT 125* 131*    Recent Labs  01/25/14 0002  NA 140  K 3.9  CL 101  CO2 22  BUN 17  CREATININE 1.26*  GLUCOSE 129*  CALCIUM 8.4   No results found for this basename: LABPT, INR,  in the last 72 hours  Dorsiflexion/Plantar flexion intact ant/post tibialis.  Decreased sensation secondary to neuropathy Incision: dressing C/D/I Compartment soft  Assessment/Plan: 3 Days Post-Op Procedure(s) (LRB): OPEN REDUCTION INTERNAL FIXATION (ORIF) DISTAL FEMUR FRACTURE (Left)  Dr Eulah Pont returns tomorrow.   Brenda Mcdonald 01/27/2014, 9:51 AM

## 2014-01-28 LAB — TYPE AND SCREEN
ABO/RH(D): A NEG
Antibody Screen: NEGATIVE
UNIT DIVISION: 0

## 2014-01-28 LAB — HEMOGLOBIN AND HEMATOCRIT, BLOOD
HCT: 29.4 % — ABNORMAL LOW (ref 36.0–46.0)
Hemoglobin: 9.3 g/dL — ABNORMAL LOW (ref 12.0–15.0)

## 2014-01-28 LAB — GLUCOSE, CAPILLARY
GLUCOSE-CAPILLARY: 98 mg/dL (ref 70–99)
GLUCOSE-CAPILLARY: 120 mg/dL — AB (ref 70–99)

## 2014-01-28 MED ORDER — AMLODIPINE BESYLATE 10 MG PO TABS: 10.0000 mg | ORAL_TABLET | Freq: Every day | ORAL | Status: DC

## 2014-01-28 MED ORDER — AMLODIPINE BESYLATE 10 MG PO TABS
10.0000 mg | ORAL_TABLET | Freq: Every day | ORAL | Status: DC
  Administered 2014-01-28: 10 mg via ORAL
  Filled 2014-01-28: qty 1

## 2014-01-28 NOTE — Progress Notes (Signed)
Physical Therapy Treatment Patient Details Name: Brenda Mcdonald MRN: [id number] DOB: November 16, 1940 Today's Date: 01/28/2014    History of Present Illness 73 y.o. female s/p ORIF of distal femur fracture. Hx of COPD, TIA, and HTN    PT Comments    Patient able to take several steps today, however Rt knee buckled during gait.  Assisted patient to sitting on bed.  Continues to require +2 assist for all mobility.  Agree with need for SNF for continued therapy at discharge.  Follow Up Recommendations  SNF;Supervision/Assistance - 24 hour     Equipment Recommendations  3in1 (PT)    Recommendations for Other Services       Precautions / Restrictions Precautions Precautions: Fall Required Braces or Orthoses: Knee Immobilizer - Left Knee Immobilizer - Left: On at all times Restrictions Weight Bearing Restrictions: Yes LLE Weight Bearing: Non weight bearing    Mobility  Bed Mobility Overal bed mobility: Needs Assistance;+2 for physical assistance Bed Mobility: Supine to Sit;Sit to Supine Rolling: Min assist;Mod assist   Supine to sit: Mod assist;+2 for physical assistance;HOB elevated Sit to supine: Max assist;+2 for physical assistance   General bed mobility comments: Verbal cues for technique. Assist to move LLE off of bed and to raise trunk to sitting.  Used bed pad to assist patient to EOB.  In sitting, patient leaning posteriorly - cues to shift weight forward.  Patient required +2 max assist to get back onto bed once Rt knee buckled with gait.  Once in supine, patient was able to roll to right with min assist to manage LLE, but required mod assist to roll to Lt.  Transfers Overall transfer level: Needs assistance Equipment used: Rolling walker (2 wheeled) Transfers: Sit to/from Stand Sit to Stand: Mod assist;+2 physical assistance         General transfer comment: Verbal cues for hand placement and to maintain NWB on LLE. Assist to rise to standing and for  balance.  Ambulation/Gait Ambulation/Gait assistance: Mod assist;+2 physical assistance Ambulation Distance (Feet): 3 Feet Assistive device: Rolling walker (2 wheeled) Gait Pattern/deviations: Step-to pattern;Decreased step length - right (Hop-to with RLE)     General Gait Details: Verbal cues for safe use of RW and gait technique.  Patient able to take 3-4 steps forward.  At that point, patient states "I can't" and Rt knee buckles.  Patient supported by PT, and bed moved to patient.  Assisted patient to sitting position with +2 max assist and returned to supine.  Patient reports no pain following session.   Stairs            Wheelchair Mobility    Modified Rankin (Stroke Patients Only)       Balance                                    Cognition Arousal/Alertness: Awake/alert Behavior During Therapy: Flat affect Overall Cognitive Status: No family/caregiver present to determine baseline cognitive functioning       Memory: Decreased short-term memory;Decreased recall of precautions              Exercises      General Comments        Pertinent Vitals/Pain     Home Living                      Prior Function  PT Goals (current goals can now be found in the care plan section) Progress towards PT goals: Progressing toward goals    Frequency  Min 3X/week    PT Plan Current plan remains appropriate    Co-evaluation             End of Session Equipment Utilized During Treatment: Gait belt;Left knee immobilizer Activity Tolerance: Patient limited by fatigue Patient left: in bed;with call bell/phone within reach;with bed alarm set     Time: 3428-7681 PT Time Calculation (min): 23 min  Charges:  $Therapeutic Activity: 23-37 mins                    G Codes:      Vena Austria February 20, 2014, 2:02 PM Durenda Hurt. Renaldo Fiddler, [facility] Acute Rehab Services Pager [phone]

## 2014-01-28 NOTE — Progress Notes (Signed)
Clinical Social Work Department CLINICAL SOCIAL WORK PLACEMENT NOTE 01/28/2014  Patient:  Brenda Mcdonald, Brenda Mcdonald  Account Number:  [id number] Admit date:  01/23/2014  Clinical Social Worker:  Jetta Lout, Theresia Majors  Date/time:  01/26/2014 05:38 PM  Clinical Social Work is seeking post-discharge placement for this patient at the following level of care:   SKILLED NURSING   (*CSW will update this form in Epic as items are completed)   01/26/2014  Patient/family provided with [facility] Department of Clinical Social Work's list of facilities offering this level of care within the geographic area requested by the patient (or if unable, by the patient's family).  01/26/2014  Patient/family informed of their freedom to choose among providers that offer the needed level of care, that participate in Medicare, Medicaid or managed care program needed by the patient, have an available bed and are willing to accept the patient.  01/26/2014  Patient/family informed of [facility]' ownership interest in [facility], as well as of the fact that they are under no obligation to receive care at this facility.  PASARR submitted to EDS on 01/25/2014 PASARR number received on   FL2 transmitted to all facilities in geographic area requested by pt/family on  01/26/2014 FL2 transmitted to all facilities within larger geographic area on   Patient informed that his/her managed care company has contracts with or will negotiate with  certain facilities, including the following:     Patient/family informed of bed offers received:  01/28/2014 Patient chooses bed at [facility] SNF Physician recommends and patient chooses bed at    Patient to be transferred to [facility] SNF on  01/28/2014 Patient to be transferred to facility by EMS Patient and family notified of transfer on 01/28/2014 Name of family member notified:  SON  The following physician request were entered in Epic:   Additional  Comments: Patient's PASARR is expired. A new PASARR has been submitted and screen is still currently running.  Maree Krabbe, MSW, Theresia Majors [phone]

## 2014-01-28 NOTE — Care Management Note (Signed)
CARE MANAGEMENT NOTE 01/28/2014  Patient:  SYRA, SIRMONS   Account Number:  [id number]  Date Initiated:  01/28/2014  Documentation initiated by:  Vance Peper  Subjective/Objective Assessment:   73 yr old female s/p Left femur ORIF     Action/Plan:   Patient is for shortterm rehab at [facility]. Patient and family have chosen [facility] SNF. Social worker is aware.   Anticipated DC Date:  01/28/2014   Anticipated DC Plan:  SKILLED NURSING FACILITY  In-house referral  Clinical Social Worker      DC Planning Services  CM consult      [facility] Choice  NA   Choice offered to / List presented to:     DME arranged  NA        HH arranged  NA      Status of service:  Completed, signed off Medicare Important Message given?  YES (If response is "NO", the following Medicare IM given date fields will be blank) Date Medicare IM given Medicare IM given by:   Date Additional Medicare IM given:   Additional Medicare IM given by:    Discharge Disposition:  SKILLED NURSING FACILITY  Per UR Regulation:  Reviewed for med. necessity/level of care/duration of stay  If discussed at Long Length of Stay Meetings, dates discussed:    Comments:   Patient discharged to SNF prior to receiving IM. message

## 2014-01-28 NOTE — Progress Notes (Signed)
Patient ID: Brenda Mcdonald, female   DOB: [birth year], 73 y.o.   MRN: [id number]     Subjective:  Patient reports pain as mild.  Patient resting in bed and is ready to go to SNF  Objective:   VITALS:   Filed Vitals:   01/27/14 1845 01/27/14 2000 01/27/14 2113 01/28/14 0642  BP: 163/63  154/62 168/62  Pulse: 69  68 68  Temp: 98.9 F (37.2 C)  97.8 F (36.6 C) 98.8 F (37.1 C)  TempSrc: Oral  Oral Oral  Resp: 18 18  16   Height:      Weight:      SpO2: 98% 95% 95% 96%    ABD soft Sensation intact distally Dorsiflexion/Plantar flexion intact Incision: dressing C/D/I and no drainage   Lab Results  Component Value Date   WBC 9.1 01/27/2014   HGB 9.3* 01/28/2014   HCT 29.4* 01/28/2014   MCV 80.2 01/27/2014   PLT 131* 01/27/2014     Assessment/Plan: 4 Days Post-Op   Principal Problem:   Femur fracture, left Active Problems:   HYPERLIPIDEMIA, MIXED   Essential hypertension, benign   CORONARY ATHEROSCLEROSIS NATIVE CORONARY ARTERY   Aortic valve disorders   Mitral valve disorders   Syncope   Diabetes   UTI (urinary tract infection)   Advance diet Up with therapy Continue plan per medicine NWB left lower ext Knee immobilizer at all times Okay for SNF from Ortho standpoint    03/30/2014 01/28/2014, 7:48 AM   03/31/2014 MD [phone]

## 2014-01-28 NOTE — Progress Notes (Signed)
I participated in the care of this patient and agree with the above history, physical and evaluation. I performed a review of the history and a physical exam as detailed   Timothy Daniel Murphy MD  

## 2014-01-28 NOTE — Progress Notes (Addendum)
CSW spoke to patient's daughter in law and son over the phone regarding SNF placement. Patient's daughter in law and son have chosen [facility] at discharge. CSW awaits a passrr number before patient can be discharged to SNF.   Maree Krabbe, MSW, Theresia Majors [phone]

## 2014-01-28 NOTE — Progress Notes (Signed)
Speech Language Pathology Treatment: Dysphagia  Patient Details Name: Brenda Mcdonald MRN: [id number] DOB: Jul 20, 1941 Today's Date: 01/28/2014 Time: 8341-9622 SLP Time Calculation (min): 15 min  Assessment / Plan / Recommendation Clinical Impression  Pt demonstrated no overt s/s of aspiration at bedside. Reported that she has ill-fitting dentures at home. Pt demonstrated improvements in sustained attention today based on previous speech therapy note on 7/4; however she continued to demonstrate prolonged mastication of regular solid. Based on observation with POs today, current risk of aspiration is none-mild. Recommend continuing dysphagia 2 diet with thin liquids, intermittent supervision to cue for small bites/ sips at a time. It seems that continued difficulties of prolonged mastication are due to lack of dentition. Given this, speech therapy will sign off at this time. Please re-consult if needs arise.     HPI HPI: 73 y.o. female s/p ORIF of distal femur fracture. Hx of COPD, TIA, and HTN. Family reported occasional coughing at home with PO, and therefore SLP swallowing evaluation was ordered.   Pertinent Vitals n/a  SLP Plan  Continue with current plan of care    Recommendations Diet recommendations: Dysphagia 2 (fine chop);Thin liquid Liquids provided via: Straw;Cup Medication Administration: Whole meds with liquid Supervision: Patient able to self feed;Intermittent supervision to cue for compensatory strategies Compensations: Slow rate;Small sips/bites Postural Changes and/or Swallow Maneuvers: Seated upright 90 degrees;Upright 30-60 min after meal              Oral Care Recommendations: Oral care BID Follow up Recommendations: None Plan: Continue with current plan of care    GO     Metro Kung, MA, CCC-SLP 01/28/2014, 3:25 PM

## 2014-01-28 NOTE — Discharge Instructions (Signed)
No weight on left leg, Wear knee immobilizer full time.   Follow with Primary MD VYAS,DHRUV B., MD in 7 days   Get CBC, CMP checked  by Primary MD next visit.   Accuchecks 4 times/day, Once in AM empty stomach and then before each meal. Log in all results and show them to your Prim.MD in 3 days. If any glucose reading is under 80 or above 300 call your Prim MD immidiately. Follow Low glucose instructions for glucose under 80 as instructed.   Activity: No weight on left leg, Wear knee immobilizer full time, with Full fall precautions use walker/cane & assistance as needed   Disposition SNF   Diet: Heart Healthy - Low Carb.  For Heart failure patients - Check your Weight same time everyday, if you gain over 2 pounds, or you develop in leg swelling, experience more shortness of breath or chest pain, call your Primary MD immediately. Follow Cardiac Low Salt Diet and 1.8 lit/day fluid restriction.   On your next visit with her primary care physician please Get Medicines reviewed and adjusted.  Please request your Prim.MD to go over all Hospital Tests and Procedure/Radiological results at the follow up, please get all Hospital records sent to your Prim MD by signing hospital release before you go home.   If you experience worsening of your admission symptoms, develop shortness of breath, life threatening emergency, suicidal or homicidal thoughts you must seek medical attention immediately by calling 911 or calling your MD immediately  if symptoms less severe.  You Must read complete instructions/literature along with all the possible adverse reactions/side effects for all the Medicines you take and that have been prescribed to you. Take any new Medicines after you have completely understood and accpet all the possible adverse reactions/side effects.   Do not drive, operating heavy machinery, perform activities at heights, swimming or participation in water activities or provide baby  sitting services if your were admitted for syncope or siezures until you have seen by Primary MD or a Neurologist and advised to do so again.  Do not drive when taking Pain medications.    Do not take more than prescribed Pain, Sleep and Anxiety Medications  Special Instructions: If you have smoked or chewed Tobacco  in the last 2 yrs please stop smoking, stop any regular Alcohol  and or any Recreational drug use.  Wear Seat belts while driving.   Please note  You were cared for by a hospitalist during your hospital stay. If you have any questions about your discharge medications or the care you received while you were in the hospital after you are discharged, you can call the unit and asked to speak with the hospitalist on call if the hospitalist that took care of you is not available. Once you are discharged, your primary care physician will handle any further medical issues. Please note that NO REFILLS for any discharge medications will be authorized once you are discharged, as it is imperative that you return to your primary care physician (or establish a relationship with a primary care physician if you do not have one) for your aftercare needs so that they can reassess your need for medications and monitor your lab values.

## 2014-01-28 NOTE — Discharge Summary (Signed)
Brenda Mcdonald, is a 73 y.o. female  DOB Sep 28, 1940  MRN [id number].  Admission date:  01/23/2014  Admitting Physician  Meredeth Ide, MD  Discharge Date:  01/28/2014   Primary MD  Ignatius Specking., MD  Recommendations for primary care physician for things to follow:   Repeat CBC BMP, monitor glycemic control closely. No weight bearing left leg. Left knee immobilizer in place at all times.   Admission Diagnosis  Mixed hyperlipidemia [272.2] Closed left femoral fracture, initial encounter [821.00] Atherosclerosis of native coronary artery of native heart without angina pectoris [414.01]   Discharge Diagnosis  Mixed hyperlipidemia [272.2] Closed left femoral fracture, initial encounter [821.00] Atherosclerosis of native coronary artery of native heart without angina pectoris [414.01]    Principal Problem:   Femur fracture, left Active Problems:   HYPERLIPIDEMIA, MIXED   Essential hypertension, benign   CORONARY ATHEROSCLEROSIS NATIVE CORONARY ARTERY   Aortic valve disorders   Mitral valve disorders   Syncope   Diabetes   UTI (urinary tract infection)      Past Medical History  Diagnosis Date  . COPD (chronic obstructive pulmonary disease)   . Bipolar disorder   . TIA (transient ischemic attack)     "a few"  . Dysphagia   . Essential hypertension, benign   . Coronary atherosclerosis of native coronary artery     Nonobstructive  . Pulmonary nodule July 2009    Nonspecific 5 mm right middle lobe  . Complication of anesthesia     "hard to wake me up after knee replacement"  . Anxiety   . Depression   . Asthma   . Pneumonia     "been in hospital for pneumonia several times"  . Diabetes mellitus, type 2   . GERD (gastroesophageal reflux disease)   . Arthritis     "fingers, feet; all over me" (01/25/2014)    Past  Surgical History  Procedure Laterality Date  . Appendectomy    . Cholecystectomy    . Vesicovaginal fistula closure w/ tah    . Total knee arthroplasty Left 2000  . Knee arthroscopy Left   . Orif distal femur fracture Left 01/24/2014  . Abdominal hysterectomy    . Dilation and curettage of uterus    . Cardiac catheterization  11/2010  . Tibia fracture surgery Left ~ 1959  . Cataract extraction, bilateral Bilateral        History of present illness and  Hospital Course:     Kindly see H&P for history of present illness and admission details, please review complete Labs, Consult reports and Test reports for all details in brief  HPI  73 year old female who has a past medical history of COPD (chronic obstructive pulmonary disease); Bipolar disorder; TIA (transient ischemic attack); Dysphagia; Essential hypertension, benign; Diabetes mellitus, type 2; Coronary atherosclerosis of native coronary artery; and Pulmonary nodule (July 2009).  Today presents to the ED, after patient fell at home. Patient is wheelchair-bound to and her husband was helping her getting out of PICC line  are when she fell forward landing on the left knee. Patient has a history of knee replacement with Dr. Bard Herbert in 2000. After patient fell she had significant 10/10 left knee pain, there was no loss of consciousness. Paramedics were called and patient was brought to the ED. She denies passing out, no chest pain or shortness of breath. Patient does have a history of moderate mitral regurgitation and has been followed by cardiology. She has a history of cardiac cath which showed nonobstructive CAD. Patient also has a history of TIA and is currently on aspirin 81 mg by mouth daily.       Hospital Course     Left femur fracture  After mechanical fall at home, she is wheelchair-bound at baseline. Post reduction internal fixation on 01-24-2014 by Dr. Marcial Pacas. Eulah Pont. Discussed with Dr. Eulah Pont to be switched to full dose  aspirin for DVT prophylaxis, being followed by PT will require placement. Nonweightbearing on left leg per orthopedics, if need immobilizer in place at all times.    Postop blood loss related anemia.  Post 1 unit of packed RBC on 01/27/2014 and H&H now stable.     Fever night of 01-24-2014, likely due to UTI which was present upon admission  Adequately treated with Rocephin based on culture and sensitivity data.     Diabetes mellitus - on Glucophage continue. Low carb diet. Monitor glycemic control     COPD  Currently stable, no shortness of breath. We'll continue DuoNeb nebulizers every 6 hours when necessary for shortness of breath.      History of nonobstructive CAD with old left bundle branch block, EF in 20 1255-60%. With moderate aortic valve and mitral valve regurgitation - Patient has a history of nonobstructive CAD, currently on beta blockers. We'll continue metoprolol for 12.5 g by mouth twice a day on ASA along with ACE inhibitor which will be continued.      Bipolar disorder with mild dementia. Continue Seroquel, Zoloft and Xanax twice a day.       CKD 3 Creatinine appears to be close to baseline which is around 1.3.      HTN - added Norvasc to home regimen for better control.       Discharge Condition: stable   Follow UP  Follow-up Information   Follow up with MURPHY, TIMOTHY, D, MD In 2 weeks.   Specialty:  Orthopedic Surgery   Contact information:   [address] [place] [address] [phone]       Follow up with VYAS,DHRUV B., MD. Schedule an appointment as soon as possible for a visit in 1 week.   Specialty:  Internal Medicine   Contact information:   [address] [place] [address] [phone]         Discharge Instructions  and  Discharge Medications      Discharge Instructions   Discharge instructions    Complete by:  As directed   No weight on left leg, Wear knee immobilizer full time.   Follow  with Primary MD VYAS,DHRUV B., MD in 7 days   Get CBC, CMP checked  by Primary MD next visit.   Accuchecks 4 times/day, Once in AM empty stomach and then before each meal. Log in all results and show them to your Prim.MD in 3 days. If any glucose reading is under 80 or above 300 call your Prim MD immidiately. Follow Low glucose instructions for glucose under 80 as instructed.   Activity: No  weight on left leg, Wear knee immobilizer full time, with Full fall precautions use walker/cane & assistance as needed   Disposition SNF   Diet: Heart Healthy - Low Carb.  For Heart failure patients - Check your Weight same time everyday, if you gain over 2 pounds, or you develop in leg swelling, experience more shortness of breath or chest pain, call your Primary MD immediately. Follow Cardiac Low Salt Diet and 1.8 lit/day fluid restriction.   On your next visit with her primary care physician please Get Medicines reviewed and adjusted.  Please request your Prim.MD to go over all Hospital Tests and Procedure/Radiological results at the follow up, please get all Hospital records sent to your Prim MD by signing hospital release before you go home.   If you experience worsening of your admission symptoms, develop shortness of breath, life threatening emergency, suicidal or homicidal thoughts you must seek medical attention immediately by calling 911 or calling your MD immediately  if symptoms less severe.  You Must read complete instructions/literature along with all the possible adverse reactions/side effects for all the Medicines you take and that have been prescribed to you. Take any new Medicines after you have completely understood and accpet all the possible adverse reactions/side effects.   Do not drive, operating heavy machinery, perform activities at heights, swimming or participation in water activities or provide baby sitting services if your were admitted for syncope or siezures until you  have seen by Primary MD or a Neurologist and advised to do so again.  Do not drive when taking Pain medications.    Do not take more than prescribed Pain, Sleep and Anxiety Medications  Special Instructions: If you have smoked or chewed Tobacco  in the last 2 yrs please stop smoking, stop any regular Alcohol  and or any Recreational drug use.  Wear Seat belts while driving.   Please note  You were cared for by a hospitalist during your hospital stay. If you have any questions about your discharge medications or the care you received while you were in the hospital after you are discharged, you can call the unit and asked to speak with the hospitalist on call if the hospitalist that took care of you is not available. Once you are discharged, your primary care physician will handle any further medical issues. Please note that NO REFILLS for any discharge medications will be authorized once you are discharged, as it is imperative that you return to your primary care physician (or establish a relationship with a primary care physician if you do not have one) for your aftercare needs so that they can reassess your need for medications and monitor your lab values.  No weight on left leg, Wear knee immobilizer full time.   Follow with Primary MD VYAS,DHRUV B., MD in 7 days   Get CBC, CMP checked  by Primary MD next visit.   Accuchecks 4 times/day, Once in AM empty stomach and then before each meal. Log in all results and show them to your Prim.MD in 3 days. If any glucose reading is under 80 or above 300 call your Prim MD immidiately. Follow Low glucose instructions for glucose under 80 as instructed.   Activity: No weight on left leg, Wear knee immobilizer full time, with Full fall precautions use walker/cane & assistance as needed   Disposition SNF   Diet: Heart Healthy - Low Carb.  For Heart failure patients - Check your Weight same time everyday, if you gain over  2 pounds, or you  develop in leg swelling, experience more shortness of breath or chest pain, call your Primary MD immediately. Follow Cardiac Low Salt Diet and 1.8 lit/day fluid restriction.   On your next visit with her primary care physician please Get Medicines reviewed and adjusted.  Please request your Prim.MD to go over all Hospital Tests and Procedure/Radiological results at the follow up, please get all Hospital records sent to your Prim MD by signing hospital release before you go home.   If you experience worsening of your admission symptoms, develop shortness of breath, life threatening emergency, suicidal or homicidal thoughts you must seek medical attention immediately by calling 911 or calling your MD immediately  if symptoms less severe.  You Must read complete instructions/literature along with all the possible adverse reactions/side effects for all the Medicines you take and that have been prescribed to you. Take any new Medicines after you have completely understood and accpet all the possible adverse reactions/side effects.   Do not drive, operating heavy machinery, perform activities at heights, swimming or participation in water activities or provide baby sitting services if your were admitted for syncope or siezures until you have seen by Primary MD or a Neurologist and advised to do so again.  Do not drive when taking Pain medications.    Do not take more than prescribed Pain, Sleep and Anxiety Medications  Special Instructions: If you have smoked or chewed Tobacco  in the last 2 yrs please stop smoking, stop any regular Alcohol  and or any Recreational drug use.  Wear Seat belts while driving.   Please note  You were cared for by a hospitalist during your hospital stay. If you have any questions about your discharge medications or the care you received while you were in the hospital after you are discharged, you can call the unit and asked to speak with the hospitalist on call if the  hospitalist that took care of you is not available. Once you are discharged, your primary care physician will handle any further medical issues. Please note that NO REFILLS for any discharge medications will be authorized once you are discharged, as it is imperative that you return to your primary care physician (or establish a relationship with a primary care physician if you do not have one) for your aftercare needs so that they can reassess your need for medications and monitor your lab values.     Maintain Knee Immobilizer    Complete by:  As directed      Non weight bearing    Complete by:  As directed             Medication List         ALPRAZolam 0.25 MG tablet  Commonly known as:  XANAX  Take 0.25-0.5 mg by mouth 2 (two) times daily. Patient takes 1 tablet in the morning and 2 tablets at bedtime     amLODipine 10 MG tablet  Commonly known as:  NORVASC  Take 1 tablet (10 mg total) by mouth daily.     aspirin EC 325 MG tablet  Take 1 tablet (325 mg total) by mouth daily.     atorvastatin 10 MG tablet  Commonly known as:  LIPITOR  Take 10 mg by mouth at bedtime.     docusate sodium 100 MG capsule  Commonly known as:  COLACE  Take 1 capsule (100 mg total) by mouth 2 (two) times daily. Continue this while taking narcotics to help  with bowel movements     DUONEB 0.5-2.5 (3) MG/3ML Soln  Generic drug:  ipratropium-albuterol  Take 3 mLs by nebulization 4 (four) times daily as needed. Shortness of breath     HYDROcodone-acetaminophen 5-325 MG per tablet  Commonly known as:  NORCO  Take 1-2 tablets by mouth every 4 (four) hours as needed for moderate pain.     lamoTRIgine 200 MG tablet  Commonly known as:  LAMICTAL  Take 200 mg by mouth daily.     lisinopril 20 MG tablet  Commonly known as:  PRINIVIL,ZESTRIL  Take 20 mg by mouth daily.     metFORMIN 500 MG tablet  Commonly known as:  GLUCOPHAGE  Take 500 mg by mouth every evening.     metoprolol tartrate 25 MG tablet   Commonly known as:  LOPRESSOR  Take 12.5 mg by mouth 2 (two) times daily.     omeprazole 40 MG capsule  Commonly known as:  PRILOSEC  Take 40 mg by mouth daily.     QUEtiapine 25 MG tablet  Commonly known as:  SEROQUEL  Take 25 mg by mouth at bedtime.     sertraline 100 MG tablet  Commonly known as:  ZOLOFT  Take 100 mg by mouth 2 (two) times daily.     solifenacin 10 MG tablet  Commonly known as:  VESICARE  Take 10 mg by mouth daily.     tiotropium 18 MCG inhalation capsule  Commonly known as:  SPIRIVA  Place 18 mcg into inhaler and inhale daily.     VENTOLIN HFA 108 (90 BASE) MCG/ACT inhaler  Generic drug:  albuterol  Inhale 2 puffs into the lungs every 6 (six) hours as needed for wheezing or shortness of breath.          Diet and Activity recommendation: See Discharge Instructions above   Consults obtained - Ortho- Dr Eulah Pont   Major procedures and Radiology Reports - PLEASE review detailed and final reports for all details, in brief -     left femoral open reduction internal fixation by Dr. Margarita Rana on 01-24-2014    Dg Chest 1 View  01/23/2014   CLINICAL DATA:  Distal femur fracture.  EXAM: CHEST - 1 VIEW  COMPARISON:  07/07/2012  FINDINGS: The heart size appears mildly enlarged. No pleural effusion or edema. There is no airspace consolidation identified. There is a calcified granuloma in the right lower lobe. Calcified atherosclerotic plaque is noted within the aortic arch.  IMPRESSION: 1. No acute cardiopulmonary abnormalities. 2. Atherosclerotic disease.   Electronically Signed   By: Signa Kell M.D.   On: 01/23/2014 19:28   Dg Pelvis 1-2 Views  01/23/2014   CLINICAL DATA:  Fall, distal femur fracture  EXAM: PELVIS - 1-2 VIEW  COMPARISON:  None.  FINDINGS: No fracture or dislocation is seen.  Mild degenerative changes of the bilateral hips.  Visualized bony pelvis appears intact.  Degenerative changes the lower lumbar spine.  Vascular calcifications.   IMPRESSION: No fracture or dislocation is seen.   Electronically Signed   By: Charline Bills M.D.   On: 01/23/2014 19:29   Dg Femur Left  01/24/2014   CLINICAL DATA:  Left femur fracture.  EXAM: DG C-ARM 61-120 MIN; LEFT FEMUR - 2 VIEW  COMPARISON:  January 23, 2014.  FINDINGS: Four intraoperative fluoroscopic images of the distal left femur were submitted for review. Status post left total knee arthroplasty. Patient has undergone internal fixation of comminuted distal left femoral fracture with  significantly improved alignment of fracture components compared to prior exam.  IMPRESSION: Status post internal fixation of comminuted distal left femoral fracture.   Electronically Signed   By: Roque Lias M.D.   On: 01/24/2014 15:07   Dg Femur Left  01/23/2014   CLINICAL DATA:  Fracture of the distal femur.  EXAM: LEFT FEMUR - 2 VIEW  COMPARISON:  None  FINDINGS: The patient is status post left total knee arthroplasty. There is a comminuted distal metaphysis heel fracture involving the left femur. There is posterior displacement of the distal fracture fragments by 1/2 shaft's width. There is also mild lateral angulation of the distal fracture fragments.  IMPRESSION: 1. Comminuted fracture involves the distal femur.   Electronically Signed   By: Signa Kell M.D.   On: 01/23/2014 19:29   Dg Knee 1-2 Views Left  01/24/2014   CLINICAL DATA:  Distal left femoral fracture.  EXAM: LEFT KNEE - 1-2 VIEW  COMPARISON:  January 23, 2014.  FINDINGS: Status post internal fixation of comminuted distal left femoral fracture with improved alignment of fracture components compared to prior exam. Status post left total knee arthroplasty which was present previously.  IMPRESSION: Status post internal fixation of comminuted distal left femoral fracture.   Electronically Signed   By: Roque Lias M.D.   On: 01/24/2014 15:36   Dg Chest Port 1 View  01/25/2014   CLINICAL DATA:  Fever and weakness  EXAM: PORTABLE CHEST - 1 VIEW   COMPARISON:  01/23/2014  FINDINGS: Cardiac shadow is stable. A calcified granuloma is again noted in the right base. No focal infiltrate or sizable effusion is seen. No bony abnormality is noted.  IMPRESSION: No acute abnormality seen.  These results were called by telephone at the time of interpretation on 01/25/2014 at 7:43 AM to Clydie Braun, the patients nurse, who verbally acknowledged these results.   Electronically Signed   By: Alcide Clever M.D.   On: 01/25/2014 07:45   Dg Knee Complete 4 Views Left  01/23/2014   CLINICAL DATA:  Left knee pain after a fall.  EXAM: LEFT KNEE - COMPLETE 4+ VIEW  COMPARISON:  None.  FINDINGS: There is a mildly comminuted fracture of the distal femoral metadiaphysis, involving the superior margin of the left total knee arthroplasty. Apex and medial and volar angulation of the fracture fragments. Bones appear osteopenic.  IMPRESSION: Distal left femur fracture associated with a left total knee arthroplasty.   Electronically Signed   By: Leanna Battles M.D.   On: 01/23/2014 18:58   Dg C-arm 1-60 Min  01/24/2014   CLINICAL DATA:  Left femur fracture.  EXAM: DG C-ARM 61-120 MIN; LEFT FEMUR - 2 VIEW  COMPARISON:  January 23, 2014.  FINDINGS: Four intraoperative fluoroscopic images of the distal left femur were submitted for review. Status post left total knee arthroplasty. Patient has undergone internal fixation of comminuted distal left femoral fracture with significantly improved alignment of fracture components compared to prior exam.  IMPRESSION: Status post internal fixation of comminuted distal left femoral fracture.   Electronically Signed   By: Roque Lias M.D.   On: 01/24/2014 15:07    Micro Results      Recent Results (from the past 240 hour(s))  URINE CULTURE     Status: None   Collection Time    01/24/14  7:36 AM      Result Value Ref Range Status   Specimen Description URINE, CATHETERIZED   Final   Special Requests none  Final   Culture  Setup Time     Final    Value: 01/24/2014 14:53     Performed at [facility] Count     Final   Value: >=100,000 COLONIES/ML     Performed at [facility]   Culture     Final   Value: ESCHERICHIA COLI     Performed at [facility]   Report Status 01/26/2014 FINAL   Final   Organism ID, Bacteria ESCHERICHIA COLI   Final  SURGICAL PCR SCREEN     Status: Abnormal   Collection Time    01/24/14 10:21 AM      Result Value Ref Range Status   MRSA, PCR NEGATIVE  NEGATIVE Final   Staphylococcus aureus POSITIVE (*) NEGATIVE Final   Comment:            The Xpert SA Assay (FDA     approved for NASAL specimens     in patients over 80 years of age),     is one component of     a comprehensive surveillance     program.  Test performance has     been validated by [employer] for patients greater     than or equal to 59 year old.     It is not intended     to diagnose infection nor to     guide or monitor treatment.       Today   Subjective:   Brenda Mcdonald today has no headache,no chest abdominal pain,no new weakness tingling or numbness, feels much better wants.  Objective:   Blood pressure 168/62, pulse 68, temperature 98.8 F (37.1 C), temperature source Oral, resp. rate 18, height 5' 6.14" (1.68 m), weight 86.7 kg (191 lb 2.2 oz), SpO2 96.00%.   Intake/Output Summary (Last 24 hours) at 01/28/14 0950 Last data filed at 01/28/14 2353  Gross per 24 hour  Intake    685 ml  Output      0 ml  Net    685 ml    Exam Awake Alert, Oriented x 3, No new F.N deficits, Normal affect [place].AT,PERRAL Supple Neck,No JVD, No cervical lymphadenopathy appriciated.  Symmetrical Chest wall movement, Good air movement bilaterally, CTAB RRR,No Gallops,Rubs or new Murmurs, No Parasternal Heave +ve B.Sounds, Abd Soft, Non tender, No organomegaly appriciated, No rebound -guarding or rigidity. No Cyanosis, Clubbing or edema, No new Rash or bruise, left knee immobilizer in place  Data  Review   CBC w Diff: Lab Results  Component Value Date   WBC 9.1 01/27/2014   HGB 9.3* 01/28/2014   HCT 29.4* 01/28/2014   PLT 131* 01/27/2014   LYMPHOPCT 12 01/23/2014   MONOPCT 7 01/23/2014   EOSPCT 1 01/23/2014   BASOPCT 0 01/23/2014    CMP: Lab Results  Component Value Date   NA 140 01/25/2014   K 3.9 01/25/2014   CL 101 01/25/2014   CO2 22 01/25/2014   BUN 17 01/25/2014   CREATININE 1.26* 01/25/2014   PROT 5.9* 01/24/2014   ALBUMIN 3.2* 01/24/2014   BILITOT 0.3 01/24/2014   ALKPHOS 76 01/24/2014   AST 12 01/24/2014   ALT 8 01/24/2014  .   Total Time in preparing paper work, data evaluation and todays exam - 35 minutes  Leroy Sea M.D on 01/28/2014 at 9:50 AM  [facility] Office  [phone]   **Disclaimer: This note may have been dictated with voice recognition  software. Similar sounding words can inadvertently be transcribed and this note may contain transcription errors which may not have been corrected upon publication of note.**

## 2014-01-28 NOTE — Progress Notes (Signed)
Clinical Social Worker facilitated patient discharge including contacting patient, family and facility to confirm patient discharge plans.  Clinical information faxed to facility and patient agreeable with plan.  CSW arranged ambulance transport via [facility] to [facility].  RN to call report prior to discharge.  Clinical Social Worker will sign off for now as social work intervention is no longer needed. Please consult [place] again if new need arises.  Maree Krabbe, MSW, Theresia Majors [phone]

## 2014-02-01 ENCOUNTER — Encounter (HOSPITAL_COMMUNITY): Payer: Self-pay | Admitting: Orthopedic Surgery

## 2014-02-21 ENCOUNTER — Encounter (HOSPITAL_COMMUNITY): Payer: Self-pay | Admitting: Orthopedic Surgery

## 2014-03-11 ENCOUNTER — Inpatient Hospital Stay (HOSPITAL_COMMUNITY): Payer: Medicare Other

## 2014-03-11 ENCOUNTER — Inpatient Hospital Stay (HOSPITAL_COMMUNITY)
Admission: AD | Admit: 2014-03-11 | Discharge: 2014-03-15 | DRG: 863 | Disposition: A | Discharge status: Skilled Nursing Facility | Payer: Medicare Other | Source: Ambulatory Visit | Attending: Internal Medicine | Admitting: Internal Medicine

## 2014-03-11 DIAGNOSIS — T814XXD Infection following a procedure, subsequent encounter: Secondary | ICD-10-CM

## 2014-03-11 DIAGNOSIS — T8140XA Infection following a procedure, unspecified, initial encounter: Secondary | ICD-10-CM | POA: Diagnosis present

## 2014-03-11 DIAGNOSIS — I059 Rheumatic mitral valve disease, unspecified: Secondary | ICD-10-CM

## 2014-03-11 DIAGNOSIS — Z880 Allergy status to penicillin: Secondary | ICD-10-CM | POA: Diagnosis not present

## 2014-03-11 DIAGNOSIS — K219 Gastro-esophageal reflux disease without esophagitis: Secondary | ICD-10-CM | POA: Diagnosis present

## 2014-03-11 DIAGNOSIS — Z8673 Personal history of transient ischemic attack (TIA), and cerebral infarction without residual deficits: Secondary | ICD-10-CM | POA: Diagnosis not present

## 2014-03-11 DIAGNOSIS — A4901 Methicillin susceptible Staphylococcus aureus infection, unspecified site: Secondary | ICD-10-CM | POA: Diagnosis present

## 2014-03-11 DIAGNOSIS — J4489 Other specified chronic obstructive pulmonary disease: Secondary | ICD-10-CM | POA: Diagnosis present

## 2014-03-11 DIAGNOSIS — IMO0001 Reserved for inherently not codable concepts without codable children: Secondary | ICD-10-CM

## 2014-03-11 DIAGNOSIS — T798XXA Other early complications of trauma, initial encounter: Secondary | ICD-10-CM

## 2014-03-11 DIAGNOSIS — I447 Left bundle-branch block, unspecified: Secondary | ICD-10-CM | POA: Diagnosis present

## 2014-03-11 DIAGNOSIS — L03119 Cellulitis of unspecified part of limb: Secondary | ICD-10-CM

## 2014-03-11 DIAGNOSIS — I2510 Atherosclerotic heart disease of native coronary artery without angina pectoris: Secondary | ICD-10-CM | POA: Diagnosis present

## 2014-03-11 DIAGNOSIS — Z881 Allergy status to other antibiotic agents status: Secondary | ICD-10-CM

## 2014-03-11 DIAGNOSIS — L02419 Cutaneous abscess of limb, unspecified: Secondary | ICD-10-CM | POA: Diagnosis present

## 2014-03-11 DIAGNOSIS — Z7982 Long term (current) use of aspirin: Secondary | ICD-10-CM

## 2014-03-11 DIAGNOSIS — Z96659 Presence of unspecified artificial knee joint: Secondary | ICD-10-CM | POA: Diagnosis not present

## 2014-03-11 DIAGNOSIS — I080 Rheumatic disorders of both mitral and aortic valves: Secondary | ICD-10-CM | POA: Diagnosis present

## 2014-03-11 DIAGNOSIS — M129 Arthropathy, unspecified: Secondary | ICD-10-CM | POA: Diagnosis present

## 2014-03-11 DIAGNOSIS — L089 Local infection of the skin and subcutaneous tissue, unspecified: Secondary | ICD-10-CM

## 2014-03-11 DIAGNOSIS — I359 Nonrheumatic aortic valve disorder, unspecified: Secondary | ICD-10-CM

## 2014-03-11 DIAGNOSIS — E119 Type 2 diabetes mellitus without complications: Secondary | ICD-10-CM | POA: Diagnosis present

## 2014-03-11 DIAGNOSIS — J449 Chronic obstructive pulmonary disease, unspecified: Secondary | ICD-10-CM | POA: Diagnosis present

## 2014-03-11 DIAGNOSIS — R001 Bradycardia, unspecified: Secondary | ICD-10-CM

## 2014-03-11 DIAGNOSIS — Z8249 Family history of ischemic heart disease and other diseases of the circulatory system: Secondary | ICD-10-CM

## 2014-03-11 DIAGNOSIS — T8149XA Infection following a procedure, other surgical site, initial encounter: Secondary | ICD-10-CM | POA: Diagnosis present

## 2014-03-11 DIAGNOSIS — Z87891 Personal history of nicotine dependence: Secondary | ICD-10-CM

## 2014-03-11 DIAGNOSIS — F319 Bipolar disorder, unspecified: Secondary | ICD-10-CM | POA: Diagnosis present

## 2014-03-11 DIAGNOSIS — Y838 Other surgical procedures as the cause of abnormal reaction of the patient, or of later complication, without mention of misadventure at the time of the procedure: Secondary | ICD-10-CM | POA: Diagnosis present

## 2014-03-11 DIAGNOSIS — L03116 Cellulitis of left lower limb: Secondary | ICD-10-CM

## 2014-03-11 DIAGNOSIS — T814XXA Infection following a procedure, initial encounter: Secondary | ICD-10-CM

## 2014-03-11 DIAGNOSIS — Z791 Long term (current) use of non-steroidal anti-inflammatories (NSAID): Secondary | ICD-10-CM

## 2014-03-11 DIAGNOSIS — E782 Mixed hyperlipidemia: Secondary | ICD-10-CM | POA: Diagnosis present

## 2014-03-11 DIAGNOSIS — T148XXA Other injury of unspecified body region, initial encounter: Secondary | ICD-10-CM

## 2014-03-11 DIAGNOSIS — I10 Essential (primary) hypertension: Secondary | ICD-10-CM | POA: Diagnosis present

## 2014-03-11 DIAGNOSIS — F17200 Nicotine dependence, unspecified, uncomplicated: Secondary | ICD-10-CM

## 2014-03-11 LAB — CBC
HCT: 35.3 % — ABNORMAL LOW (ref 36.0–46.0)
HEMATOCRIT: 34.2 % — AB (ref 36.0–46.0)
HEMOGLOBIN: 10.7 g/dL — AB (ref 12.0–15.0)
Hemoglobin: 11.1 g/dL — ABNORMAL LOW (ref 12.0–15.0)
MCH: 26.1 pg (ref 26.0–34.0)
MCH: 25.8 pg — ABNORMAL LOW (ref 26.0–34.0)
MCHC: 31.4 g/dL (ref 30.0–36.0)
MCHC: 31.3 g/dL (ref 30.0–36.0)
MCV: 82.9 fL (ref 78.0–100.0)
MCV: 82.4 fL (ref 78.0–100.0)
PLATELETS: 201 10*3/uL (ref 150–400)
Platelets: 192 10*3/uL (ref 150–400)
RBC: 4.26 MIL/uL (ref 3.87–5.11)
RBC: 4.15 MIL/uL (ref 3.87–5.11)
RDW: 17.2 % — ABNORMAL HIGH (ref 11.5–15.5)
RDW: 17.1 % — ABNORMAL HIGH (ref 11.5–15.5)
WBC: 8 10*3/uL (ref 4.0–10.5)
WBC: 7.7 10*3/uL (ref 4.0–10.5)

## 2014-03-11 LAB — COMPREHENSIVE METABOLIC PANEL
ALT: 11 U/L (ref 0–35)
AST: 16 U/L (ref 0–37)
Albumin: 3.5 g/dL (ref 3.5–5.2)
Alkaline Phosphatase: 127 U/L — ABNORMAL HIGH (ref 39–117)
Anion gap: 12 (ref 5–15)
BUN: 22 mg/dL (ref 6–23)
CHLORIDE: 106 meq/L (ref 96–112)
CO2: 28 mEq/L (ref 19–32)
Calcium: 9.3 mg/dL (ref 8.4–10.5)
Creatinine, Ser: 1.08 mg/dL (ref 0.50–1.10)
GFR calc Af Amer: 57 mL/min — ABNORMAL LOW (ref >90)
GFR calc non Af Amer: 49 mL/min — ABNORMAL LOW (ref >90)
Glucose, Bld: 118 mg/dL — ABNORMAL HIGH (ref 70–99)
Potassium: 4.4 mEq/L (ref 3.7–5.3)
Sodium: 146 mEq/L (ref 137–147)
Total Bilirubin: <0.2 mg/dL — ABNORMAL LOW (ref 0.3–1.2)
Total Protein: 6.8 g/dL (ref 6.0–8.3)

## 2014-03-11 LAB — URINE MICROSCOPIC-ADD ON

## 2014-03-11 LAB — CREATININE, SERUM
Creatinine, Ser: 1.14 mg/dL — ABNORMAL HIGH (ref 0.50–1.10)
GFR calc Af Amer: 54 mL/min — ABNORMAL LOW (ref >90)
GFR calc non Af Amer: 46 mL/min — ABNORMAL LOW (ref >90)

## 2014-03-11 LAB — URINALYSIS, ROUTINE W REFLEX MICROSCOPIC
Bilirubin Urine: NEGATIVE
GLUCOSE, UA: NEGATIVE mg/dL
HGB URINE DIPSTICK: NEGATIVE
KETONES UR: NEGATIVE mg/dL
Nitrite: NEGATIVE
Protein, ur: NEGATIVE mg/dL
Specific Gravity, Urine: 1.011 (ref 1.005–1.030)
Urobilinogen, UA: 0.2 mg/dL (ref 0.0–1.0)
pH: 6 (ref 5.0–8.0)

## 2014-03-11 LAB — GLUCOSE, CAPILLARY
GLUCOSE-CAPILLARY: 113 mg/dL — AB (ref 70–99)
Glucose-Capillary: 110 mg/dL — ABNORMAL HIGH (ref 70–99)
Glucose-Capillary: 128 mg/dL — ABNORMAL HIGH (ref 70–99)

## 2014-03-11 MED ORDER — ASPIRIN EC 325 MG PO TBEC
325.0000 mg | DELAYED_RELEASE_TABLET | Freq: Every day | ORAL | Status: DC
  Administered 2014-03-12 – 2014-03-13 (×2): 325 mg via ORAL
  Filled 2014-03-11 (×2): qty 1

## 2014-03-11 MED ORDER — ATORVASTATIN CALCIUM 10 MG PO TABS
10.0000 mg | ORAL_TABLET | Freq: Every day | ORAL | Status: DC
  Administered 2014-03-11 – 2014-03-14 (×4): 10 mg via ORAL
  Filled 2014-03-11 (×6): qty 1

## 2014-03-11 MED ORDER — DARIFENACIN HYDROBROMIDE ER 15 MG PO TB24
15.0000 mg | ORAL_TABLET | Freq: Every day | ORAL | Status: DC
  Administered 2014-03-12 – 2014-03-15 (×4): 15 mg via ORAL
  Filled 2014-03-11 (×4): qty 1

## 2014-03-11 MED ORDER — METOPROLOL TARTRATE 12.5 MG HALF TABLET
12.5000 mg | ORAL_TABLET | Freq: Two times a day (BID) | ORAL | Status: DC
  Filled 2014-03-11 (×4): qty 1

## 2014-03-11 MED ORDER — ONDANSETRON HCL 4 MG PO TABS: 4.0000 mg | ORAL_TABLET | Freq: Four times a day (QID) | ORAL | Status: DC | PRN

## 2014-03-11 MED ORDER — TIOTROPIUM BROMIDE MONOHYDRATE 18 MCG IN CAPS
18.0000 ug | ORAL_CAPSULE | Freq: Every day | RESPIRATORY_TRACT | Status: DC
  Administered 2014-03-13 – 2014-03-15 (×3): 18 ug via RESPIRATORY_TRACT
  Filled 2014-03-11 (×2): qty 5

## 2014-03-11 MED ORDER — ALPRAZOLAM 0.25 MG PO TABS
0.2500 mg | ORAL_TABLET | Freq: Every day | ORAL | Status: DC
  Administered 2014-03-12 – 2014-03-15 (×4): 0.25 mg via ORAL
  Filled 2014-03-11 (×4): qty 1

## 2014-03-11 MED ORDER — GUAIFENESIN-DM 100-10 MG/5ML PO SYRP
5.0000 mL | ORAL_SOLUTION | ORAL | Status: DC | PRN
  Filled 2014-03-11: qty 5

## 2014-03-11 MED ORDER — HEPARIN SODIUM (PORCINE) 5000 UNIT/ML IJ SOLN
5000.0000 [IU] | Freq: Three times a day (TID) | INTRAMUSCULAR | Status: DC
  Administered 2014-03-11 – 2014-03-15 (×10): 5000 [IU] via SUBCUTANEOUS
  Filled 2014-03-11 (×15): qty 1

## 2014-03-11 MED ORDER — HYDROCODONE-ACETAMINOPHEN 5-325 MG PO TABS
1.0000 | ORAL_TABLET | ORAL | Status: DC | PRN
  Administered 2014-03-11 – 2014-03-13 (×2): 1 via ORAL
  Filled 2014-03-11 (×2): qty 1

## 2014-03-11 MED ORDER — ACETAMINOPHEN 650 MG RE SUPP: 650.0000 mg | Freq: Four times a day (QID) | RECTAL | Status: DC | PRN

## 2014-03-11 MED ORDER — MORPHINE SULFATE 2 MG/ML IJ SOLN: 1.0000 mg | INTRAMUSCULAR | Status: DC | PRN

## 2014-03-11 MED ORDER — DOCUSATE SODIUM 100 MG PO CAPS
100.0000 mg | ORAL_CAPSULE | Freq: Two times a day (BID) | ORAL | Status: DC
  Administered 2014-03-11: 100 mg via ORAL
  Filled 2014-03-11 (×2): qty 1

## 2014-03-11 MED ORDER — INSULIN ASPART 100 UNIT/ML ~~LOC~~ SOLN
0.0000 [IU] | Freq: Three times a day (TID) | SUBCUTANEOUS | Status: DC
  Administered 2014-03-13 – 2014-03-14 (×4): 1 [IU] via SUBCUTANEOUS

## 2014-03-11 MED ORDER — IPRATROPIUM-ALBUTEROL 0.5-2.5 (3) MG/3ML IN SOLN
3.0000 mL | Freq: Three times a day (TID) | RESPIRATORY_TRACT | Status: DC
  Administered 2014-03-11: 3 mL via RESPIRATORY_TRACT
  Filled 2014-03-11: qty 3

## 2014-03-11 MED ORDER — VANCOMYCIN HCL 10 G IV SOLR: 1500.0000 mg | INTRAVENOUS | Status: DC

## 2014-03-11 MED ORDER — LISINOPRIL 20 MG PO TABS
20.0000 mg | ORAL_TABLET | Freq: Every day | ORAL | Status: DC
  Administered 2014-03-12 – 2014-03-15 (×4): 20 mg via ORAL
  Filled 2014-03-11 (×4): qty 1

## 2014-03-11 MED ORDER — DOCUSATE SODIUM 100 MG PO CAPS
100.0000 mg | ORAL_CAPSULE | Freq: Two times a day (BID) | ORAL | Status: DC
  Administered 2014-03-12 – 2014-03-15 (×7): 100 mg via ORAL
  Filled 2014-03-11 (×9): qty 1

## 2014-03-11 MED ORDER — SODIUM CHLORIDE 0.9 % IV SOLN
INTRAVENOUS | Status: DC
  Administered 2014-03-11 – 2014-03-13 (×2): via INTRAVENOUS

## 2014-03-11 MED ORDER — SERTRALINE HCL 100 MG PO TABS
100.0000 mg | ORAL_TABLET | Freq: Two times a day (BID) | ORAL | Status: DC
  Administered 2014-03-11: 100 mg via ORAL
  Filled 2014-03-11 (×3): qty 1

## 2014-03-11 MED ORDER — ALBUTEROL SULFATE (2.5 MG/3ML) 0.083% IN NEBU: 2.5000 mg | INHALATION_SOLUTION | RESPIRATORY_TRACT | Status: DC | PRN

## 2014-03-11 MED ORDER — QUETIAPINE FUMARATE 25 MG PO TABS
25.0000 mg | ORAL_TABLET | Freq: Every day | ORAL | Status: DC
Start: 2014-03-11 — End: 2014-03-15
  Administered 2014-03-11 – 2014-03-14 (×4): 25 mg via ORAL
  Filled 2014-03-11 (×6): qty 1

## 2014-03-11 MED ORDER — SERTRALINE HCL 100 MG PO TABS
100.0000 mg | ORAL_TABLET | Freq: Two times a day (BID) | ORAL | Status: DC
  Administered 2014-03-12 – 2014-03-15 (×7): 100 mg via ORAL
  Filled 2014-03-11 (×10): qty 1

## 2014-03-11 MED ORDER — AMLODIPINE BESYLATE 10 MG PO TABS
10.0000 mg | ORAL_TABLET | Freq: Every day | ORAL | Status: DC
  Administered 2014-03-13 – 2014-03-15 (×3): 10 mg via ORAL
  Filled 2014-03-11 (×4): qty 1

## 2014-03-11 MED ORDER — METOPROLOL TARTRATE 12.5 MG HALF TABLET
12.5000 mg | ORAL_TABLET | Freq: Two times a day (BID) | ORAL | Status: DC
  Administered 2014-03-11 – 2014-03-15 (×5): 12.5 mg via ORAL
  Filled 2014-03-11 (×9): qty 1

## 2014-03-11 MED ORDER — ONDANSETRON HCL 4 MG/2ML IJ SOLN: 4.0000 mg | Freq: Four times a day (QID) | INTRAMUSCULAR | Status: DC | PRN

## 2014-03-11 MED ORDER — ALPRAZOLAM 0.5 MG PO TABS
0.5000 mg | ORAL_TABLET | Freq: Every day | ORAL | Status: DC
  Administered 2014-03-11 – 2014-03-14 (×4): 0.5 mg via ORAL
  Filled 2014-03-11 (×4): qty 1

## 2014-03-11 MED ORDER — PANTOPRAZOLE SODIUM 40 MG PO TBEC
40.0000 mg | DELAYED_RELEASE_TABLET | Freq: Every day | ORAL | Status: DC
  Administered 2014-03-12 – 2014-03-15 (×4): 40 mg via ORAL
  Filled 2014-03-11 (×4): qty 1

## 2014-03-11 MED ORDER — ACETAMINOPHEN 325 MG PO TABS: 650.0000 mg | ORAL_TABLET | Freq: Four times a day (QID) | ORAL | Status: DC | PRN

## 2014-03-11 MED ORDER — ALPRAZOLAM 0.25 MG PO TABS: 0.2500 mg | ORAL_TABLET | Freq: Two times a day (BID) | ORAL | Status: DC

## 2014-03-11 MED ORDER — VANCOMYCIN HCL 10 G IV SOLR
1750.0000 mg | Freq: Once | INTRAVENOUS | Status: AC
  Administered 2014-03-11: 1750 mg via INTRAVENOUS
  Filled 2014-03-11: qty 1750

## 2014-03-11 MED ORDER — LAMOTRIGINE 200 MG PO TABS
200.0000 mg | ORAL_TABLET | Freq: Every day | ORAL | Status: DC
  Administered 2014-03-12 – 2014-03-15 (×4): 200 mg via ORAL
  Filled 2014-03-11 (×4): qty 1

## 2014-03-11 NOTE — H&P (Addendum)
PATIENT DETAILS Name: Brenda Mcdonald Age: 73 y.o. Sex: female Date of Birth: 10/22/1940 Admit Date: 03/11/2014 RWE:[id number],[id number] B., MD   CHIEF COMPLAINT:  Left distal femur wound s/p femur fracture repair  HPI: Brenda Mcdonald is a 73 y.o. female with a Past Medical History of  COPD, hypertension, DM type 2, and femur fracture s/p surgical repair on 7/2  who presents today with the above noted complaint. Two caregivers from patient's nursing home were present and provided most of history.Patient was seen at Dr Greig Right office for post hospital discharge today, where he noted discharge from the patient's left lateral thigh incision. He then referred the patient to the hospitalist service as a direct admit.Per patient and her caregiver's, she has had no fever, nausea, vomiting. Denies  chest pain, abdominal pain, chills, night sweats. She however does complain of mild pain at the incision site.She has been partially weight bearing on the LLE since her surgery.  ALLERGIES:   Allergies  Allergen Reactions  . Azithromycin   . Penicillins     Tolerated Rocephin (July 2015)  . Tetracycline     PAST MEDICAL HISTORY: Past Medical History  Diagnosis Date  . COPD (chronic obstructive pulmonary disease)   . Bipolar disorder   . TIA (transient ischemic attack)     "a few"  . Dysphagia   . Essential hypertension, benign   . Coronary atherosclerosis of native coronary artery     Nonobstructive  . Pulmonary nodule July 2009    Nonspecific 5 mm right middle lobe  . Complication of anesthesia     "hard to wake me up after knee replacement"  . Anxiety   . Depression   . Asthma   . Pneumonia     "been in hospital for pneumonia several times"  . Diabetes mellitus, type 2   . GERD (gastroesophageal reflux disease)   . Arthritis     "fingers, feet; all over me" (01/25/2014)    PAST SURGICAL HISTORY: Past Surgical History  Procedure Laterality Date  . Appendectomy    .  Cholecystectomy    . Vesicovaginal fistula closure w/ tah    . Total knee arthroplasty Left 2000  . Knee arthroscopy Left   . Orif distal femur fracture Left 01/24/2014  . Abdominal hysterectomy    . Dilation and curettage of uterus    . Cardiac catheterization  11/2010  . Tibia fracture surgery Left ~ 1959  . Cataract extraction, bilateral Bilateral   . Orif femur fracture Left 01/24/2014    Procedure: OPEN REDUCTION INTERNAL FIXATION (ORIF) DISTAL FEMUR FRACTURE;  Surgeon: [name], MD;  Location: [facility] OR;  Service: Orthopedics;  Laterality: Left;    MEDICATIONS AT HOME: Prior to Admission medications   Medication Sig Start Date End Date Taking? Authorizing Provider  ALPRAZolam (XANAX) 0.25 MG tablet Take 0.25-0.5 mg by mouth 2 (two) times daily. Patient takes 1 tablet in the morning and 2 tablets at bedtime    Historical Provider, MD  amLODipine (NORVASC) 10 MG tablet Take 1 tablet (10 mg total) by mouth daily. 01/28/14   Leroy Sea, MD  aspirin EC 325 MG tablet Take 1 tablet (325 mg total) by mouth daily. 01/24/14   [name], MD  atorvastatin (LIPITOR) 10 MG tablet Take 10 mg by mouth at bedtime.    Historical Provider, MD  docusate sodium (COLACE) 100 MG capsule Take 1 capsule (100 mg total) by mouth 2 (two)  times daily. Continue this while taking narcotics to help with bowel movements 01/24/14   [name], MD  HYDROcodone-acetaminophen (NORCO) 5-325 MG per tablet Take 1-2 tablets by mouth every 4 (four) hours as needed for moderate pain. 01/24/14   [name], MD  ipratropium-albuterol (DUONEB) 0.5-2.5 (3) MG/3ML SOLN Take 3 mLs by nebulization 4 (four) times daily as needed. Shortness of breath    Historical Provider, MD  lamoTRIgine (LAMICTAL) 200 MG tablet Take 200 mg by mouth daily.      Historical Provider, MD  lisinopril (PRINIVIL,ZESTRIL) 20 MG tablet Take 20 mg by mouth daily.      Historical Provider, MD  metFORMIN (GLUCOPHAGE) 500 MG tablet Take 500 mg by  mouth every evening.     Historical Provider, MD  metoprolol tartrate (LOPRESSOR) 25 MG tablet Take 12.5 mg by mouth 2 (two) times daily.    Historical Provider, MD  omeprazole (PRILOSEC) 40 MG capsule Take 40 mg by mouth daily.  10/03/12   Historical Provider, MD  QUEtiapine (SEROQUEL) 25 MG tablet Take 25 mg by mouth at bedtime.      Historical Provider, MD  sertraline (ZOLOFT) 100 MG tablet Take 100 mg by mouth 2 (two) times daily.     Historical Provider, MD  solifenacin (VESICARE) 10 MG tablet Take 10 mg by mouth daily.      Historical Provider, MD  tiotropium (SPIRIVA) 18 MCG inhalation capsule Place 18 mcg into inhaler and inhale daily.      Historical Provider, MD  VENTOLIN HFA 108 (90 BASE) MCG/ACT inhaler Inhale 2 puffs into the lungs every 6 (six) hours as needed for wheezing or shortness of breath.  07/31/12   Historical Provider, MD    FAMILY HISTORY: Family History  Problem Relation Age of Onset  . Coronary artery disease      SOCIAL HISTORY:  reports that she quit smoking about 3 years ago. Her smoking use included Cigarettes. She has a 29 pack-year smoking history. She has never used smokeless tobacco. She reports that she does not drink alcohol or use illicit drugs.  REVIEW OF SYSTEMS:  Constitutional:   No  weight loss, night sweats,  Fevers, chills, fatigue.  HEENT:    No headaches, Difficulty swallowing,Tooth/dental problems,Sore throat,  No sneezing, itching, ear ache, nasal congestion, post nasal drip,   Cardio-vascular: No chest pain,  Orthopnea, PND, anasarca, dizziness, palpitations  GI:  No heartburn, indigestion, abdominal pain, nausea, vomiting, diarrhea, change in bowel habits, loss of appetite  Resp: No shortness of breath with exertion or at rest.  No excess mucus, no productive cough, No non-productive cough,  No coughing up of blood.No change in color of mucus.No wheezing.No chest wall deformity  Skin:  no rash or lesions.  GU:  no dysuria, change  in color of urine, no urgency or frequency.  No flank pain.  Musculoskeletal: No joint pain or swelling.  No decreased range of motion.  No back pain.  Psych: No change in mood or affect. No depression or anxiety.  No memory loss.   PHYSICAL EXAM: There were no vitals taken for this visit.  General appearance: Awake, alert, not in any distress. Speech Clear. Not toxic Looking HEENT: Atraumatic and Normocephalic, pupils equally reactive to light and accomodation Neck: supple, no JVD. No cervical lymphadenopathy.  Chest:Good air entry bilaterally, no added sounds  CVS: S1 S2 regular, no murmurs.  Abdomen: Bowel sounds present, Non tender and not distended with no gaurding, rigidity or rebound.  Extremities: B/L Lower Ext shows no edema, both legs are warm to touch Neurology: Awake alert, and oriented X 3, CN II-XII intact, Non focal Skin: No Rash Wounds: Site is minimally erythematous and is painful to palpation. Wound expresses minimal amount of serous fluid with pressure.  EKG: None  ASSESSMENT AND PLAN: Present on Admission:  Suspected Infected Left Femur Surgical Site: - s/p femur fracture repair 7/2 - minimal serous exudate; patient afebrile and non toxic looking.  - will wait for results of CBC before ordering blood cultures - empiric Vancomycin - UA ordered  - morphine iv prn for pain; norco q 4 prn; zofran prn for nausea - up with assistance when possible -await Orthopedics input (Dr Eulah Pont) for further recommendations, will hold off on ordering imaging till seen by orthopedics  Addendum -spoke with Dr Lilian Kapur antibiotics for now, NPO post midnight  Active Problems: Hypertension - controlling with amlodipine, metoprolol, lisinopril - 169/68 on admission  History of nonobstructive CAD with old left bundle branch block, EF in 20 1255-60%. With moderate aortic valve and mitral valve regurgitation - Patient has a history of nonobstructive CAD, currently on beta  blockers. We'll continue metoprolol for 12.5 g by mouth twice a day on ASA along with ACE inhibitor which will be continued.   Bipolar Disorder: - continuing home dose of Lamictal 200 mg daily, Seroquel 25 mg daily bedtime, and Zoloft 100 mg BID  Hyperlipidemia - continue atorvastatin   DM Type 2 - SSI sensitive with meals - carb modified/heart healthy diet  - CBG monitoring q 6 hours  COPD - controlling with duonebs and inhalers - significant hx of tobacco use - O2 as needed   Further plan will depend as patient's clinical course evolves and further radiologic and laboratory data become available. Patient will be monitored closely.  Above noted plan was discussed with patient/nursing home nurses, they were in agreement.   DVT Prophylaxis: Heparin  Code Status: Full Code  Total time spent for admission equals 45 minutes.  Nadean Corwin  [facility] Pager [phone]  If 7PM-7AM, please contact night-coverage [email or web address] Password [email or web address] 03/11/2014, 1:38 PM  **Disclaimer: This note may have been dictated with voice recognition software. Similar sounding words can inadvertently be transcribed and this note may contain transcription errors which may not have been corrected upon publication of note.**  Attending Patient was seen, examined,treatment plan was discussed with the Physician extender. I have directly reviewed the clinical findings, lab, imaging studies and management of this patient in detail. I have made the necessary changes to the above noted documentation, and agree with the documentation, as recorded by the Physician extender.  Windell Norfolk MD [facility].

## 2014-03-11 NOTE — Progress Notes (Signed)
Utilization review completed.  

## 2014-03-11 NOTE — Progress Notes (Signed)
ANTIBIOTIC CONSULT NOTE - INITIAL  Pharmacy Consult for Vancomycin Indication: Wound Infection  Allergies  Allergen Reactions  . Azithromycin   . Penicillins     Tolerated Rocephin (July 2015)  . Tetracycline     Patient Measurements:   Adjusted Body Weight: n/a   Vital Signs: Temp: 97.8 F (36.6 C) (08/17 1341) BP: 169/68 mmHg (08/17 1341) Pulse Rate: 61 (08/17 1341) Intake/Output from previous day:   Intake/Output from this shift:    Labs: No results found for this basename: WBC, HGB, PLT, LABCREA, CREATININE,  in the last 72 hours The CrCl is unknown because both a height and weight (above a minimum accepted value) are required for this calculation. No results found for this basename: VANCOTROUGH, VANCOPEAK, VANCORANDOM, GENTTROUGH, GENTPEAK, GENTRANDOM, TOBRATROUGH, TOBRAPEAK, TOBRARND, AMIKACINPEAK, AMIKACINTROU, AMIKACIN,  in the last 72 hours   Microbiology: No results found for this or any previous visit (from the past 720 hour(s)).  Medical History: Past Medical History  Diagnosis Date  . COPD (chronic obstructive pulmonary disease)   . Bipolar disorder   . TIA (transient ischemic attack)     "a few"  . Dysphagia   . Essential hypertension, benign   . Coronary atherosclerosis of native coronary artery     Nonobstructive  . Pulmonary nodule July 2009    Nonspecific 5 mm right middle lobe  . Complication of anesthesia     "hard to wake me up after knee replacement"  . Anxiety   . Depression   . Asthma   . Pneumonia     "been in hospital for pneumonia several times"  . Diabetes mellitus, type 2   . GERD (gastroesophageal reflux disease)   . Arthritis     "fingers, feet; all over me" (01/25/2014)    Medications:  Prescriptions prior to admission  Medication Sig Dispense Refill  . ALPRAZolam (XANAX) 0.25 MG tablet Take 0.25-0.5 mg by mouth 2 (two) times daily. Patient takes 1 tablet in the morning and 2 tablets at bedtime      . amLODipine (NORVASC)  10 MG tablet Take 1 tablet (10 mg total) by mouth daily.      Marland Kitchen aspirin EC 325 MG tablet Take 1 tablet (325 mg total) by mouth daily.  30 tablet  0  . atorvastatin (LIPITOR) 10 MG tablet Take 10 mg by mouth at bedtime.      . docusate sodium (COLACE) 100 MG capsule Take 1 capsule (100 mg total) by mouth 2 (two) times daily. Continue this while taking narcotics to help with bowel movements  30 capsule  1  . HYDROcodone-acetaminophen (NORCO) 5-325 MG per tablet Take 1-2 tablets by mouth every 4 (four) hours as needed for moderate pain.  90 tablet  0  . ipratropium-albuterol (DUONEB) 0.5-2.5 (3) MG/3ML SOLN Take 3 mLs by nebulization 4 (four) times daily as needed. Shortness of breath      . lamoTRIgine (LAMICTAL) 200 MG tablet Take 200 mg by mouth daily.        Marland Kitchen lisinopril (PRINIVIL,ZESTRIL) 20 MG tablet Take 20 mg by mouth daily.        . metFORMIN (GLUCOPHAGE) 500 MG tablet Take 500 mg by mouth every evening.       . metoprolol tartrate (LOPRESSOR) 25 MG tablet Take 12.5 mg by mouth 2 (two) times daily.      Marland Kitchen omeprazole (PRILOSEC) 40 MG capsule Take 40 mg by mouth daily.       . QUEtiapine (SEROQUEL) 25 MG tablet  Take 25 mg by mouth at bedtime.        . sertraline (ZOLOFT) 100 MG tablet Take 100 mg by mouth 2 (two) times daily.       . solifenacin (VESICARE) 10 MG tablet Take 10 mg by mouth daily.        Marland Kitchen tiotropium (SPIRIVA) 18 MCG inhalation capsule Place 18 mcg into inhaler and inhale daily.        . VENTOLIN HFA 108 (90 BASE) MCG/ACT inhaler Inhale 2 puffs into the lungs every 6 (six) hours as needed for wheezing or shortness of breath.        Assessment: 50 YOF who is direct admit from orthopedic office due to infection of surgical site s/p femur fracture repair on 7/2. Pharmacy consulted to start vancomycin for empiric therapy.  WBC wnl. Pt is afebrile. CrCl ~ 87 mL/min   Goal of Therapy:  Vancomycin trough level 10-15 mcg/ml  Plan:  -Give Vancomycin 1750 mg x 1 dose followed by 1500  mg IV Q 24 hours -Monitor CBC, renal fx, cultures and patient's clinical progress -VT at [facility], PharmD.  Clinical Pharmacist Pager [phone]

## 2014-03-12 ENCOUNTER — Inpatient Hospital Stay (HOSPITAL_COMMUNITY): Payer: Medicare Other | Admitting: Anesthesiology

## 2014-03-12 ENCOUNTER — Encounter (HOSPITAL_COMMUNITY)
Admission: AD | Disposition: A | Discharge status: Skilled Nursing Facility | Payer: Medicare Other | Source: Ambulatory Visit | Attending: Internal Medicine

## 2014-03-12 ENCOUNTER — Encounter (HOSPITAL_COMMUNITY): Payer: Medicare Other | Admitting: Anesthesiology

## 2014-03-12 DIAGNOSIS — T8140XA Infection following a procedure, unspecified, initial encounter: Principal | ICD-10-CM

## 2014-03-12 DIAGNOSIS — Y849 Medical procedure, unspecified as the cause of abnormal reaction of the patient, or of later complication, without mention of misadventure at the time of the procedure: Secondary | ICD-10-CM

## 2014-03-12 HISTORY — PX: INCISION AND DRAINAGE: SHX5863

## 2014-03-12 LAB — CBC
HCT: 30.8 % — ABNORMAL LOW (ref 36.0–46.0)
HEMOGLOBIN: 10 g/dL — AB (ref 12.0–15.0)
MCH: 26.5 pg (ref 26.0–34.0)
MCHC: 32.5 g/dL (ref 30.0–36.0)
MCV: 81.7 fL (ref 78.0–100.0)
PLATELETS: 192 10*3/uL (ref 150–400)
RBC: 3.77 MIL/uL — AB (ref 3.87–5.11)
RDW: 17.2 % — ABNORMAL HIGH (ref 11.5–15.5)
WBC: 7.4 10*3/uL (ref 4.0–10.5)

## 2014-03-12 LAB — GLUCOSE, CAPILLARY
GLUCOSE-CAPILLARY: 100 mg/dL — AB (ref 70–99)
GLUCOSE-CAPILLARY: 93 mg/dL (ref 70–99)
GLUCOSE-CAPILLARY: 102 mg/dL — AB (ref 70–99)
GLUCOSE-CAPILLARY: 107 mg/dL — AB (ref 70–99)
Glucose-Capillary: 99 mg/dL (ref 70–99)
Glucose-Capillary: 119 mg/dL — ABNORMAL HIGH (ref 70–99)

## 2014-03-12 LAB — BASIC METABOLIC PANEL
Anion gap: 11 (ref 5–15)
BUN: 20 mg/dL (ref 6–23)
CO2: 27 mEq/L (ref 19–32)
Calcium: 8.9 mg/dL (ref 8.4–10.5)
Chloride: 106 mEq/L (ref 96–112)
Creatinine, Ser: 1.09 mg/dL (ref 0.50–1.10)
GFR, EST AFRICAN AMERICAN: 57 mL/min — AB (ref >90)
GFR, EST NON AFRICAN AMERICAN: 49 mL/min — AB (ref >90)
Glucose, Bld: 107 mg/dL — ABNORMAL HIGH (ref 70–99)
POTASSIUM: 4.1 meq/L (ref 3.7–5.3)
SODIUM: 144 meq/L (ref 137–147)

## 2014-03-12 LAB — SURGICAL PCR SCREEN
MRSA, PCR: NEGATIVE
STAPHYLOCOCCUS AUREUS: POSITIVE — AB

## 2014-03-12 SURGERY — INCISION AND DRAINAGE
Anesthesia: General | Site: Knee | Laterality: Left

## 2014-03-12 MED ORDER — CHLORHEXIDINE GLUCONATE CLOTH 2 % EX PADS
6.0000 | MEDICATED_PAD | Freq: Every day | CUTANEOUS | Status: DC
  Administered 2014-03-12: 6 via TOPICAL

## 2014-03-12 MED ORDER — LISINOPRIL 20 MG PO TABS
20.0000 mg | ORAL_TABLET | Freq: Once | ORAL | Status: DC
  Filled 2014-03-12: qty 1

## 2014-03-12 MED ORDER — ONDANSETRON HCL 4 MG/2ML IJ SOLN
INTRAMUSCULAR | Status: AC
Start: 2014-03-12 — End: 2014-03-12
  Filled 2014-03-12: qty 2

## 2014-03-12 MED ORDER — 0.9 % SODIUM CHLORIDE (POUR BTL) OPTIME
TOPICAL | Status: DC | PRN
  Administered 2014-03-12: 1000 mL

## 2014-03-12 MED ORDER — LACTATED RINGERS IV SOLN
INTRAVENOUS | Status: DC | PRN
  Administered 2014-03-12: 14:00:00 via INTRAVENOUS

## 2014-03-12 MED ORDER — ASPIRIN EC 325 MG PO TBEC
325.0000 mg | DELAYED_RELEASE_TABLET | Freq: Every day | ORAL | Status: DC
  Administered 2014-03-12 – 2014-03-15 (×3): 325 mg via ORAL
  Filled 2014-03-12 (×4): qty 1

## 2014-03-12 MED ORDER — FENTANYL CITRATE 0.05 MG/ML IJ SOLN
INTRAMUSCULAR | Status: AC
  Filled 2014-03-12: qty 5

## 2014-03-12 MED ORDER — METOCLOPRAMIDE HCL 10 MG PO TABS: 5.0000 mg | ORAL_TABLET | Freq: Three times a day (TID) | ORAL | Status: DC | PRN

## 2014-03-12 MED ORDER — LIDOCAINE HCL (CARDIAC) 20 MG/ML IV SOLN
INTRAVENOUS | Status: AC
  Filled 2014-03-12: qty 5

## 2014-03-12 MED ORDER — CEFAZOLIN SODIUM-DEXTROSE 2-3 GM-% IV SOLR
INTRAVENOUS | Status: DC | PRN
  Administered 2014-03-12: 2 g via INTRAVENOUS

## 2014-03-12 MED ORDER — AMLODIPINE BESYLATE 10 MG PO TABS: 10.0000 mg | ORAL_TABLET | Freq: Every day | ORAL | Status: DC

## 2014-03-12 MED ORDER — FENTANYL CITRATE 0.05 MG/ML IJ SOLN
INTRAMUSCULAR | Status: DC | PRN
  Administered 2014-03-12: 50 ug via INTRAVENOUS

## 2014-03-12 MED ORDER — SODIUM CHLORIDE 0.9 % IR SOLN
Status: DC | PRN
  Administered 2014-03-12: 3000 mL
  Administered 2014-03-12 (×2): 1000 mL

## 2014-03-12 MED ORDER — VANCOMYCIN HCL IN DEXTROSE 1-5 GM/200ML-% IV SOLN
1000.0000 mg | Freq: Two times a day (BID) | INTRAVENOUS | Status: DC
  Filled 2014-03-12 (×2): qty 200

## 2014-03-12 MED ORDER — ONDANSETRON HCL 4 MG/2ML IJ SOLN
INTRAMUSCULAR | Status: DC | PRN
  Administered 2014-03-12: 4 mg via INTRAVENOUS

## 2014-03-12 MED ORDER — VANCOMYCIN HCL 10 G IV SOLR
1500.0000 mg | INTRAVENOUS | Status: DC
  Administered 2014-03-12 – 2014-03-14 (×3): 1500 mg via INTRAVENOUS
  Filled 2014-03-12 (×6): qty 1500

## 2014-03-12 MED ORDER — METOCLOPRAMIDE HCL 5 MG/ML IJ SOLN: 5.0000 mg | Freq: Three times a day (TID) | INTRAMUSCULAR | Status: DC | PRN

## 2014-03-12 MED ORDER — SENNA 8.6 MG PO TABS
2.0000 | ORAL_TABLET | Freq: Every day | ORAL | Status: DC
  Administered 2014-03-13 – 2014-03-14 (×2): 17.2 mg via ORAL
  Filled 2014-03-12 (×5): qty 2

## 2014-03-12 MED ORDER — BISACODYL 10 MG RE SUPP: 10.0000 mg | Freq: Every day | RECTAL | Status: DC | PRN

## 2014-03-12 MED ORDER — PROPOFOL 10 MG/ML IV BOLUS
INTRAVENOUS | Status: DC | PRN
  Administered 2014-03-12: 120 mg via INTRAVENOUS

## 2014-03-12 MED ORDER — PROPOFOL 10 MG/ML IV BOLUS
INTRAVENOUS | Status: AC
  Filled 2014-03-12: qty 20

## 2014-03-12 MED ORDER — HYDROMORPHONE HCL PF 1 MG/ML IJ SOLN: 0.2500 mg | INTRAMUSCULAR | Status: DC | PRN

## 2014-03-12 MED ORDER — POLYETHYLENE GLYCOL 3350 17 G PO PACK: 17.0000 g | PACK | Freq: Every day | ORAL | Status: DC | PRN

## 2014-03-12 MED ORDER — CHLORHEXIDINE GLUCONATE 4 % EX LIQD
Freq: Once | CUTANEOUS | Status: DC
  Filled 2014-03-12: qty 15

## 2014-03-12 MED ORDER — LIDOCAINE HCL (CARDIAC) 20 MG/ML IV SOLN
INTRAVENOUS | Status: DC | PRN
  Administered 2014-03-12: 40 mg via INTRAVENOUS

## 2014-03-12 MED ORDER — MUPIROCIN 2 % EX OINT
1.0000 | TOPICAL_OINTMENT | Freq: Two times a day (BID) | CUTANEOUS | Status: DC
Start: 2014-03-12 — End: 2014-03-15
  Administered 2014-03-12 – 2014-03-15 (×7): 1 via NASAL
  Filled 2014-03-12 (×2): qty 22

## 2014-03-12 MED ORDER — ALBUTEROL SULFATE (2.5 MG/3ML) 0.083% IN NEBU: 2.5000 mg | INHALATION_SOLUTION | Freq: Four times a day (QID) | RESPIRATORY_TRACT | Status: DC | PRN

## 2014-03-12 MED ORDER — ALBUTEROL SULFATE HFA 108 (90 BASE) MCG/ACT IN AERS: 2.0000 | INHALATION_SPRAY | Freq: Four times a day (QID) | RESPIRATORY_TRACT | Status: DC | PRN

## 2014-03-12 MED ORDER — AMLODIPINE BESYLATE 10 MG PO TABS
10.0000 mg | ORAL_TABLET | Freq: Once | ORAL | Status: AC
  Administered 2014-03-12: 10 mg via ORAL
  Filled 2014-03-12: qty 1

## 2014-03-12 SURGICAL SUPPLY — 56 items
BANDAGE ELASTIC 4 VELCRO ST LF (GAUZE/BANDAGES/DRESSINGS) ×1 IMPLANT
BANDAGE ELASTIC 6 VELCRO ST LF (GAUZE/BANDAGES/DRESSINGS) ×1 IMPLANT
BLADE SURG 10 STRL SS (BLADE) ×3 IMPLANT
BNDG COHESIVE 4X5 TAN STRL (GAUZE/BANDAGES/DRESSINGS) ×1 IMPLANT
BNDG COHESIVE 6X5 TAN STRL LF (GAUZE/BANDAGES/DRESSINGS) ×2 IMPLANT
BNDG GAUZE ELAST 4 BULKY (GAUZE/BANDAGES/DRESSINGS) ×3 IMPLANT
BOOTCOVER CLEANROOM LRG (PROTECTIVE WEAR) ×2 IMPLANT
COVER SURGICAL LIGHT HANDLE (MISCELLANEOUS) ×3 IMPLANT
CUFF TOURNIQUET SINGLE 34IN LL (TOURNIQUET CUFF) IMPLANT
DRAPE INCISE IOBAN 66X45 STRL (DRAPES) ×2 IMPLANT
DRAPE ORTHO SPLIT 77X108 STRL (DRAPES) ×6
DRAPE SURG ORHT 6 SPLT 77X108 (DRAPES) IMPLANT
DURAPREP 26ML APPLICATOR (WOUND CARE) ×3 IMPLANT
ELECT REM PT RETURN 9FT ADLT (ELECTROSURGICAL) ×3
ELECTRODE REM PT RTRN 9FT ADLT (ELECTROSURGICAL) IMPLANT
EVACUATOR 1/8 PVC DRAIN (DRAIN) IMPLANT
FACESHIELD WRAPAROUND (MASK) ×9 IMPLANT
FACESHIELD WRAPAROUND OR TEAM (MASK) ×3 IMPLANT
GAUZE SPONGE 4X4 12PLY STRL (GAUZE/BANDAGES/DRESSINGS) ×1 IMPLANT
GAUZE XEROFORM 1X8 LF (GAUZE/BANDAGES/DRESSINGS) ×1 IMPLANT
GLOVE BIO SURGEON STRL SZ7.5 (GLOVE) ×4 IMPLANT
GLOVE BIO SURGEON STRL SZ8 (GLOVE) ×3 IMPLANT
GLOVE BIOGEL PI IND STRL 7.0 (GLOVE) IMPLANT
GLOVE BIOGEL PI IND STRL 7.5 (GLOVE) IMPLANT
GLOVE BIOGEL PI IND STRL 8 (GLOVE) IMPLANT
GLOVE BIOGEL PI INDICATOR 7.0 (GLOVE) ×4
GLOVE BIOGEL PI INDICATOR 7.5 (GLOVE) ×2
GLOVE BIOGEL PI INDICATOR 8 (GLOVE) ×2
GLOVE ORTHO TXT STRL SZ7.5 (GLOVE) ×6 IMPLANT
GLOVE SURG SS PI 7.0 STRL IVOR (GLOVE) ×4 IMPLANT
GOWN STRL REUS W/ TWL LRG LVL3 (GOWN DISPOSABLE) ×3 IMPLANT
GOWN STRL REUS W/TWL 2XL LVL3 (GOWN DISPOSABLE) ×3 IMPLANT
GOWN STRL REUS W/TWL LRG LVL3 (GOWN DISPOSABLE) ×9
HANDPIECE INTERPULSE COAX TIP (DISPOSABLE) ×3
KIT BASIN OR (CUSTOM PROCEDURE TRAY) ×3 IMPLANT
KIT ROOM TURNOVER OR (KITS) ×3 IMPLANT
MANIFOLD NEPTUNE II (INSTRUMENTS) ×3 IMPLANT
NS IRRIG 1000ML POUR BTL (IV SOLUTION) ×3 IMPLANT
PACK ORTHO EXTREMITY (CUSTOM PROCEDURE TRAY) ×3 IMPLANT
PAD ARMBOARD 7.5X6 YLW CONV (MISCELLANEOUS) ×6 IMPLANT
PENCIL BUTTON HOLSTER BLD 10FT (ELECTRODE) IMPLANT
SET HNDPC FAN SPRY TIP SCT (DISPOSABLE) IMPLANT
SET IRRIG Y TYPE TUR BLADDER L (SET/KITS/TRAYS/PACK) ×2 IMPLANT
SPONGE LAP 18X18 X RAY DECT (DISPOSABLE) ×3 IMPLANT
STOCKINETTE IMPERVIOUS 9X36 MD (GAUZE/BANDAGES/DRESSINGS) ×1 IMPLANT
STOCKINETTE IMPERVIOUS LG (DRAPES) ×2 IMPLANT
SUT ETHILON 3 0 PS 1 (SUTURE) ×4 IMPLANT
TOWEL OR 17X24 6PK STRL BLUE (TOWEL DISPOSABLE) ×3 IMPLANT
TOWEL OR 17X26 10 PK STRL BLUE (TOWEL DISPOSABLE) ×3 IMPLANT
TOWEL OR NON WOVEN STRL DISP B (DISPOSABLE) ×3 IMPLANT
TUBE ANAEROBIC SPECIMEN COL (MISCELLANEOUS) IMPLANT
TUBE CONNECTING 12'X1/4 (SUCTIONS) ×1
TUBE CONNECTING 12X1/4 (SUCTIONS) ×2 IMPLANT
UNDERPAD 30X30 INCONTINENT (UNDERPADS AND DIAPERS) ×3 IMPLANT
WATER STERILE IRR 1000ML POUR (IV SOLUTION) ×1 IMPLANT
YANKAUER SUCT BULB TIP NO VENT (SUCTIONS) ×3 IMPLANT

## 2014-03-12 NOTE — Interval H&P Note (Signed)
History and Physical Interval Note:  03/12/2014 7:19 AM  Brenda Mcdonald  has presented today for surgery, with the diagnosis of surgical abscess  The various methods of treatment have been discussed with the patient and family. After consideration of risks, benefits and other options for treatment, the patient has consented to  Procedure(s): LEFT INCISION/DRAINAGE DEEP ABSCESS/BURSA/HEMATOMA THIGH/KNEE REGION (Left) as a surgical intervention .  The patient's history has been reviewed, patient examined, no change in status, stable for surgery.  I have reviewed the patient's chart and labs.  Questions were answered to the patient's satisfaction.     Nalaya Wojdyla, D

## 2014-03-12 NOTE — Transfer of Care (Signed)
Immediate Anesthesia Transfer of Care Note  Patient: Brenda Mcdonald  Procedure(s) Performed: Procedure(s): LEFT INCISION/DRAINAGE DEEP ABSCESS/BURSA/HEMATOMA THIGH/KNEE REGION (Left)  Patient Location: PACU  Anesthesia Type:General  Level of Consciousness: awake, alert , oriented and sedated  Airway & Oxygen Therapy: Patient Spontanous Breathing and Patient connected to face mask oxygen  Post-op Assessment: Report given to PACU RN, Post -op Vital signs reviewed and stable and Patient moving all extremities  Post vital signs: Reviewed and stable  Complications: No apparent anesthesia complications

## 2014-03-12 NOTE — Op Note (Signed)
03/11/2014 - 03/12/2014  2:34 PM  PATIENT:  Brenda Mcdonald    PRE-OPERATIVE DIAGNOSIS:  surgical abscess  POST-OPERATIVE DIAGNOSIS:  Same  PROCEDURE:  LEFT INCISION/DRAINAGE DEEP ABSCESS/BURSA/HEMATOMA THIGH/KNEE REGION  SURGEON:  Rashell Shambaugh, D, MD  ASSISTANT: none  ANESTHESIA:   General  PREOPERATIVE INDICATIONS:  [name] is a  73 y.o. female with a diagnosis of surgical abscess who failed conservative measures and elected for surgical management.    The risks benefits and alternatives were discussed with the patient preoperatively including but not limited to the risks of infection, bleeding, nerve injury, cardiopulmonary complications, the need for revision surgery, among others, and the patient was willing to proceed.  OPERATIVE IMPLANTS: none  OPERATIVE FINDINGS: purulent fluid superficially. No fascial defect.   BLOOD LOSS: 20  COMPLICATIONS: none  TOURNIQUET TIME: none  OPERATIVE PROCEDURE:  Patient was identified in the preoperative holding area and site was marked by me She was transported to the operating theater and placed on the table in supine position taking care to pad all bony prominences. After a preincinduction time out anesthesia was induced. The left lower extremity was prepped and draped in normal sterile fashion and a pre-incision timeout was performed. She received nothing for preoperative antibiotics for planned intra-op cultures.   I made a 4 cm incision through the initial incision and noted purulent fluid in 2 separate superficial pocket I obtained tissue from this and sent it to the lab for culture. I explained these pockets and probed multiple areas within her wound I found no fascial defect she had healed her IT band well and again there was no penetration into the fascia with the muscle beneath. I performed a thorough irrigation with 4 L of saline I debrided any loose tissue and again probed multiple times and found no fascial defect. After  this  After this I closed the incision loosely with a nylon interrupted stitch and placed a suction dressing.  POST OPERATIVE PLAN: ID consult vancomycin for now WBAT, ASA for DVT px.

## 2014-03-12 NOTE — Consult Note (Signed)
   ORTHOPAEDIC CONSULTATION  REQUESTING PHYSICIAN: Mackenzie Short, MD  Chief Complaint: 4wks s/p Left supracondylar femur fx ORIF with a draining wound  HPI: Brenda Mcdonald is a 73 y.o. female who complains of  Wound drainage.   Past Medical History  Diagnosis Date  . COPD (chronic obstructive pulmonary disease)   . Bipolar disorder   . TIA (transient ischemic attack)     "a few"  . Dysphagia   . Essential hypertension, benign   . Coronary atherosclerosis of native coronary artery     Nonobstructive  . Pulmonary nodule July 2009    Nonspecific 5 mm right middle lobe  . Complication of anesthesia     "hard to wake me up after knee replacement"  . Anxiety   . Depression   . Asthma   . Pneumonia     "been in hospital for pneumonia several times"  . Diabetes mellitus, type 2   . GERD (gastroesophageal reflux disease)   . Arthritis     "fingers, feet; all over me" (01/25/2014)   Past Surgical History  Procedure Laterality Date  . Appendectomy    . Cholecystectomy    . Vesicovaginal fistula closure w/ tah    . Total knee arthroplasty Left 2000  . Knee arthroscopy Left   . Orif distal femur fracture Left 01/24/2014  . Abdominal hysterectomy    . Dilation and curettage of uterus    . Cardiac catheterization  11/2010  . Tibia fracture surgery Left ~ 1959  . Cataract extraction, bilateral Bilateral   . Orif femur fracture Left 01/24/2014    Procedure: OPEN REDUCTION INTERNAL FIXATION (ORIF) DISTAL FEMUR FRACTURE;  Surgeon: Alaster Asfaw D Alem Fahl, MD;  Location: [facility] OR;  Service: Orthopedics;  Laterality: Left;   History   Social History  . Marital Status: Married    Spouse Name: N/A    Number of Children: N/A  . Years of Education: N/A   Social History Main Topics  . Smoking status: Former Smoker -- 0.50 packs/day for 58 years    Types: Cigarettes    Quit date: 07/26/2010  . Smokeless tobacco: Never Used  . Alcohol Use: No  . Drug Use: No  . Sexual Activity: No   Other  Topics Concern  . Not on file   Social History Narrative  . No narrative on file   Family History  Problem Relation Age of Onset  . Coronary artery disease     Allergies  Allergen Reactions  . Azithromycin     Per MAR  . Penicillins     Tolerated Rocephin (July 2015)  . Tetracycline     Per MAR   Prior to Admission medications   Medication Sig Start Date End Date Taking? Authorizing Provider  albuterol (PROVENTIL HFA;VENTOLIN HFA) 108 (90 BASE) MCG/ACT inhaler Inhale 2 puffs into the lungs every 6 (six) hours as needed for wheezing or shortness of breath.   Yes Historical Provider, MD  ALPRAZolam (XANAX) 0.25 MG tablet Take 0.25-0.5 mg by mouth 2 (two) times daily. Patient takes 1 tablet in the morning and 2 tablets at bedtime   Yes Historical Provider, MD  amLODipine (NORVASC) 10 MG tablet Take 10 mg by mouth daily.   Yes Historical Provider, MD  aspirin EC 325 MG tablet Take 325 mg by mouth daily.   Yes Historical Provider, MD  atorvastatin (LIPITOR) 10 MG tablet Take 10 mg by mouth at bedtime.   Yes Historical Provider, MD  calcium-vitamin D (OSCAL   WITH D) 500-200 MG-UNIT per tablet Take 1 tablet by mouth 2 (two) times daily.   Yes Historical Provider, MD  cholecalciferol (VITAMIN D) 1000 UNITS tablet Take 1,000 Units by mouth daily.   Yes Historical Provider, MD  HYDROcodone-acetaminophen (NORCO/VICODIN) 5-325 MG per tablet Take 1 tablet by mouth every 4 (four) hours as needed for moderate pain.   Yes Historical Provider, MD  ipratropium-albuterol (DUONEB) 0.5-2.5 (3) MG/3ML SOLN Take 3 mLs by nebulization 4 (four) times daily as needed. Shortness of breath   Yes Historical Provider, MD  lamoTRIgine (LAMICTAL) 200 MG tablet Take 200 mg by mouth daily.     Yes Historical Provider, MD  lisinopril (PRINIVIL,ZESTRIL) 20 MG tablet Take 20 mg by mouth daily.     Yes Historical Provider, MD  metFORMIN (GLUCOPHAGE) 500 MG tablet Take 500 mg by mouth every evening.    Yes Historical  Provider, MD  metoprolol tartrate (LOPRESSOR) 25 MG tablet Take 12.5 mg by mouth 2 (two) times daily.   Yes Historical Provider, MD  Multiple Vitamins-Minerals (MULTIVITAMIN PO) Take 1 tablet by mouth daily.   Yes Historical Provider, MD  omeprazole (PRILOSEC) 40 MG capsule Take 40 mg by mouth daily.  10/03/12  Yes Historical Provider, MD  QUEtiapine (SEROQUEL) 25 MG tablet Take 25 mg by mouth at bedtime.     Yes Historical Provider, MD  sertraline (ZOLOFT) 100 MG tablet Take 100 mg by mouth 2 (two) times daily.    Yes Historical Provider, MD  solifenacin (VESICARE) 10 MG tablet Take 10 mg by mouth daily.     Yes Historical Provider, MD  tiotropium (SPIRIVA) 18 MCG inhalation capsule Place 18 mcg into inhaler and inhale daily.     Yes Historical Provider, MD   Dg Chest Port 1 View  03/11/2014   CLINICAL DATA:  Short of breath  EXAM: PORTABLE CHEST - 1 VIEW  COMPARISON:  01/25/2014  FINDINGS: The lungs remain clear without infiltrate or effusion. Negative for heart failure. Right lower lobe calcified granuloma is unchanged.  IMPRESSION: No active disease.   Electronically Signed   By: Charles  Clark M.D.   On: 03/11/2014 15:37    Positive ROS: All other systems have been reviewed and were otherwise negative with the exception of those mentioned in the HPI and as above.  Labs cbc  Recent Labs  03/11/14 1605 03/12/14 0348  WBC 7.7 7.4  HGB 10.7* 10.0*  HCT 34.2* 30.8*  PLT 192 192    Labs inflam No results found for this basename: ESR, CRP,  in the last 72 hours  Labs coag No results found for this basename: INR, PT, PTT,  in the last 72 hours   Recent Labs  03/11/14 1402 03/11/14 1605 03/12/14 0348  NA 146  --  144  K 4.4  --  4.1  CL 106  --  106  CO2 28  --  27  GLUCOSE 118*  --  107*  BUN 22  --  20  CREATININE 1.08 1.14* 1.09  CALCIUM 9.3  --  8.9    Physical Exam: Filed Vitals:   03/12/14 0511  BP: 140/55  Pulse: 61  Temp: 98.2 F (36.8 C)  Resp:     General: Alert, no acute distress Cardiovascular: No pedal edema Respiratory: No cyanosis, no use of accessory musculature GI: No organomegaly, abdomen is soft and non-tender Skin: No lesions in the area of chief complaint other than those listed below in MSK exam.  Neurologic: Sensation intact   distally Lymphatic: No axillary or cervical lymphadenopathy  MUSCULOSKELETAL:  LLE: wiggles toes, purulent drainage from a 5mm wound at surgical incision.  Other extremities are atraumatic with painless ROM and NVI.  Assessment: Surgical infection   Plan: I&D today Weight Bearing Status: NWB PT VTE px: SCD's and chemical post op.    Balthazar Dooly, D, MD Cell [phone]   03/12/2014 7:17 AM     

## 2014-03-12 NOTE — Progress Notes (Addendum)
[facility] PROGRESS NOTE  Brenda Mcdonald SWN:[id number] DOB: 12-29-1940 DOA: 03/11/2014 PCP: Ignatius Specking., MD  Brief summary  Brenda Mcdonald is a 73 y.o. female with a Past Medical History of COPD, hypertension, DM type 2, and femur fracture s/p surgical repair on 7/2 who was seen at Dr Greig Right office for post hospital discharge at which time he noted discharge from the patient's left lateral thigh incision. Pt referred to the hospitalist service as a direct admit.  Per patient and her caregiver's, she had no fever, nausea, vomiting however, she did have mild pain at the incision site.  She underwent I&D on 8/18 by Dr. Eulah Pont and ID is consulting.    Assessment/Plan  Suspected Infected Left Femur Surgical Site s/p femur fracture repair 7/2, clearly has not healed well/normally over last 1.5 months - morphine iv prn for pain; norco q 4 prn; zofran prn for nausea  - up with assistance when possible  - plan for I&D this afternoon -  F/u wound culture -  Vancomycin resumed post-procedure  Hypertension, blood pressure elevated pre-operatively - Cont amlodipine, metoprolol, lisinopril   Nonobstructive CAD with old left bundle branch block, EF in 2012 55-60%. With moderate aortic valve and mitral valve regurgitation -  Continue ASA, BB, statin  Bipolar Disorder, stable, continue Lamictal 200 mg daily, Seroquel 25 mg daily bedtime, and Zoloft 100 mg BID   DM Type 2, CBG somewhat low but wnl -  Continue low dose SSI AC   COPD, stable on RA, no SOB - cont albuterol prn, spiriva, and inhalers  - significant hx of tobacco use  - O2 as needed   Diet:  NPO preoperatively Access:  PIV IVF:  yes Proph:  heparin  Code Status: full Family Communication: patient alone Disposition Plan: pending I&D, culture data, plan for antibiotics.  Will need PT post-operatively   Consultants:  Dr. Eulah Pont, orthopedics  Procedures:  CXR  Antibiotics:  Vancomycin x 1 on 8/17 and resumed  post-surgery on 8/18  HPI/Subjective:  Denies CP, SOB, nausea, vomiting, diarrhea.  Last BM was 2 days ago.  Left leg not painful but not feeling well either.    Objective: Filed Vitals:   03/11/14 2035 03/12/14 0131 03/12/14 0511 03/12/14 0745  BP: 172/57 134/68 140/55 148/64  Pulse: 64 82 61 61  Temp: 97.7 F (36.5 C) 99 F (37.2 C) 98.2 F (36.8 C) 98.1 F (36.7 C)  TempSrc: Oral Oral Oral Oral  Resp:    15  Height:      Weight:      SpO2: 98% 100% 94% 98%    Intake/Output Summary (Last 24 hours) at 03/12/14 0840 Last data filed at 03/12/14 0800  Gross per 24 hour  Intake 1811.67 ml  Output    430 ml  Net 1381.67 ml   Filed Weights   03/11/14 1500  Weight: 86.7 kg (191 lb 2.2 oz)    Exam:   General:  WF, No acute distress  HEENT:  NCAT, MMM  Cardiovascular:  RRR, nl S1, S2 no mrg, 2+ pulses, warm extremities  Respiratory:  CTAB, no increased WOB  Abdomen:   NABS, soft, NT/ND  MSK:   Normal tone and bulk, no LEE.  Left left lateral thigh right above the knee, incision has dehisced mid-way with 1cm opening draining yellow-green discharge, some induration of surrounding tissue  Neuro:  Grossly intact  Data Reviewed: Basic Metabolic Panel:  Recent Labs Lab 03/11/14 1402 03/11/14 1605 03/12/14 0348  NA  146  --  144  K 4.4  --  4.1  CL 106  --  106  CO2 28  --  27  GLUCOSE 118*  --  107*  BUN 22  --  20  CREATININE 1.08 1.14* 1.09  CALCIUM 9.3  --  8.9   Liver Function Tests:  Recent Labs Lab 03/11/14 1402  AST 16  ALT 11  ALKPHOS 127*  BILITOT <0.2*  PROT 6.8  ALBUMIN 3.5   No results found for this basename: LIPASE, AMYLASE,  in the last 168 hours No results found for this basename: AMMONIA,  in the last 168 hours CBC:  Recent Labs Lab 03/11/14 1402 03/11/14 1605 03/12/14 0348  WBC 8.0 7.7 7.4  HGB 11.1* 10.7* 10.0*  HCT 35.3* 34.2* 30.8*  MCV 82.9 82.4 81.7  PLT 201 192 192   Cardiac Enzymes: No results found for this  basename: CKTOTAL, CKMB, CKMBINDEX, TROPONINI,  in the last 168 hours BNP (last 3 results) No results found for this basename: PROBNP,  in the last 8760 hours CBG:  Recent Labs Lab 03/11/14 1622 03/11/14 2106 03/12/14 0359 03/12/14 0615 03/12/14 0824  GLUCAP 113* 128* 99 100* 93    Recent Results (from the past 240 hour(s))  SURGICAL PCR SCREEN     Status: Abnormal   Collection Time    03/12/14  4:01 AM      Result Value Ref Range Status   MRSA, PCR NEGATIVE  NEGATIVE Final   Staphylococcus aureus POSITIVE (*) NEGATIVE Final   Comment:            The Xpert SA Assay (FDA     approved for NASAL specimens     in patients over 83 years of age),     is one component of     a comprehensive surveillance     program.  Test performance has     been validated by [employer] for patients greater     than or equal to 74 year old.     It is not intended     to diagnose infection nor to     guide or monitor treatment.     Studies: Dg Chest Port 1 View  03/11/2014   CLINICAL DATA:  Brenda Mcdonald of breath  EXAM: PORTABLE CHEST - 1 VIEW  COMPARISON:  01/25/2014  FINDINGS: The lungs remain clear without infiltrate or effusion. Negative for heart failure. Right lower lobe calcified granuloma is unchanged.  IMPRESSION: No active disease.   Electronically Signed   By: Marlan Palau M.D.   On: 03/11/2014 15:37    Scheduled Meds: . ALPRAZolam  0.25 mg Oral Daily  . ALPRAZolam  0.25-0.5 mg Oral BID  . ALPRAZolam  0.5 mg Oral QHS  . amLODipine  10 mg Oral Daily  . aspirin EC  325 mg Oral Daily  . atorvastatin  10 mg Oral QHS  . chlorhexidine   Topical Once  . Chlorhexidine Gluconate Cloth  6 each Topical Daily  . darifenacin  15 mg Oral Daily  . docusate sodium  100 mg Oral BID  . docusate sodium  100 mg Oral BID  . heparin  5,000 Units Subcutaneous 3 times per day  . insulin aspart  0-9 Units Subcutaneous TID WC  . lamoTRIgine  200 mg Oral Daily  . lisinopril  20 mg Oral Daily  .  metoprolol tartrate  12.5 mg Oral BID  . metoprolol tartrate  12.5 mg  Oral BID  . mupirocin ointment  1 application Nasal BID  . pantoprazole  40 mg Oral Daily  . QUEtiapine  25 mg Oral QHS  . sertraline  100 mg Oral BID  . sertraline  100 mg Oral BID  . tiotropium  18 mcg Inhalation Daily   Continuous Infusions: . sodium chloride 50 mL/hr at 03/11/14 1457    Principal Problem:   Cellulitis of drainage site following surgery Active Problems:   HYPERLIPIDEMIA, MIXED   Essential hypertension, benign   CORONARY ATHEROSCLEROSIS NATIVE CORONARY ARTERY   Diabetes    Time spent: 30 min    Rital Cavey, [facility]  [facility] Pager [phone]. If 7PM-7AM, please contact night-coverage at [email or web address], password [facility] 03/12/2014, 8:40 AM  LOS: 1 day

## 2014-03-12 NOTE — Progress Notes (Signed)
From PACu alert oriented wound vac on lt leg to wall suction at 150

## 2014-03-12 NOTE — Clinical Social Work Note (Addendum)
CSW spoke with admissions coordinator at [facility] [phone]) who confirmed that patient can return to [facility] after discharge.  CSW will continue to follow.  Merlyn Lot, LCSWA Clinical Social Worker [phone]

## 2014-03-12 NOTE — Anesthesia Preprocedure Evaluation (Addendum)
Anesthesia Evaluation  Patient identified by MRN, date of birth, ID band Patient awake    Reviewed: Allergy & Precautions, H&P , NPO status , Patient's Chart, lab work & pertinent test results, reviewed documented beta blocker date and time   Airway Mallampati: II TM Distance: >3 FB Neck ROM: Full    Dental no notable dental hx. (+) Edentulous Upper, Edentulous Lower, Dental Advisory Given   Pulmonary asthma , COPD COPD inhaler, former smoker,  breath sounds clear to auscultation  Pulmonary exam normal       Cardiovascular hypertension, Pt. on medications + CAD Rhythm:Regular Rate:Normal     Neuro/Psych Anxiety Depression Bipolar Disorder TIA   GI/Hepatic Neg liver ROS, GERD-  Medicated and Controlled,  Endo/Other  diabetes, Type 2, Oral Hypoglycemic Agents  Renal/GU negative Renal ROS  negative genitourinary   Musculoskeletal   Abdominal   Peds  Hematology negative hematology ROS (+)   Anesthesia Other Findings   Reproductive/Obstetrics negative OB ROS                          Anesthesia Physical Anesthesia Plan  ASA: III  Anesthesia Plan: General   Post-op Pain Management:    Induction: Intravenous  Airway Management Planned: LMA  Additional Equipment:   Intra-op Plan:   Post-operative Plan: Extubation in OR  Informed Consent: I have reviewed the patients History and Physical, chart, labs and discussed the procedure including the risks, benefits and alternatives for the proposed anesthesia with the patient or authorized representative who has indicated his/her understanding and acceptance.   Dental advisory given  Plan Discussed with: CRNA  Anesthesia Plan Comments:         Anesthesia Quick Evaluation

## 2014-03-12 NOTE — Anesthesia Postprocedure Evaluation (Signed)
  Anesthesia Post-op Note  Patient: Brenda Mcdonald  Procedure(s) Performed: Procedure(s): LEFT INCISION/DRAINAGE DEEP ABSCESS/BURSA/HEMATOMA THIGH/KNEE REGION (Left)  Patient Location: PACU  Anesthesia Type:General  Level of Consciousness: awake and alert   Airway and Oxygen Therapy: Patient Spontanous Breathing  Post-op Pain: none  Post-op Assessment: Post-op Vital signs reviewed, Patient's Cardiovascular Status Stable and Respiratory Function Stable  Post-op Vital Signs: Reviewed  Filed Vitals:   03/12/14 1500  BP: 182/76  Pulse: 63  Temp:   Resp: 13    Complications: No apparent anesthesia complications

## 2014-03-12 NOTE — Consult Note (Signed)
[facility]     Reason for Consult: deep post op wound infection    Referring Physician: Dr. Eulah Pont  Principal Problem:   Cellulitis of drainage site following surgery Active Problems:   HYPERLIPIDEMIA, MIXED   Essential hypertension, benign   CORONARY ATHEROSCLEROSIS NATIVE CORONARY ARTERY   Diabetes   . [MAR HOLD] ALPRAZolam  0.25 mg Oral Daily  . [MAR HOLD] ALPRAZolam  0.5 mg Oral QHS  . [MAR HOLD] amLODipine  10 mg Oral Daily  . [facility] HOLD] aspirin EC  325 mg Oral Daily  . [MAR HOLD] atorvastatin  10 mg Oral QHS  . [facility] HOLD] chlorhexidine   Topical Once  . [MAR HOLD] Chlorhexidine Gluconate Cloth  6 each Topical Daily  . [facility] HOLD] darifenacin  15 mg Oral Daily  . [facility] HOLD] docusate sodium  100 mg Oral BID  . [MAR HOLD] heparin  5,000 Units Subcutaneous 3 times per day  . [MAR HOLD] insulin aspart  0-9 Units Subcutaneous TID WC  . [facility] HOLD] lamoTRIgine  200 mg Oral Daily  . [MAR HOLD] lisinopril  20 mg Oral Daily  . [facility] HOLD] metoprolol tartrate  12.5 mg Oral BID  . [facility] HOLD] mupirocin ointment  1 application Nasal BID  . [MAR HOLD] pantoprazole  40 mg Oral Daily  . [MAR HOLD] QUEtiapine  25 mg Oral QHS  . [MAR HOLD] senna  2 tablet Oral QHS  . [facility] HOLD] sertraline  100 mg Oral BID  . [facility] HOLD] tiotropium  18 mcg Inhalation Daily    Recommendations: Vancomycin after surgery, await cultures Will add GNR coverage if gram stain and cultures negative  Assessment: She has a post op wound infection s/p ORIF.    Antibiotics: Received one dose of vancomycin yesterday  HPI: Brenda Mcdonald is a 73 y.o. female She has a history of PJ placed in 2000 of left knee then left femur fracture s/p ORIF with pinning, screws with plate on 03/30/17.  After surgery she developed drainage from the wound which has not improved.  No fever, no chills.  Penicillin allergy but tolerates cephalosporins.    Review of Systems: A comprehensive review of  systems was negative.  Past Medical History  Diagnosis Date  . COPD (chronic obstructive pulmonary disease)   . Bipolar disorder   . TIA (transient ischemic attack)     "a few"  . Dysphagia   . Essential hypertension, benign   . Coronary atherosclerosis of native coronary artery     Nonobstructive  . Pulmonary nodule July 2009    Nonspecific 5 mm right middle lobe  . Complication of anesthesia     "hard to wake me up after knee replacement"  . Anxiety   . Depression   . Asthma   . Pneumonia     "been in hospital for pneumonia several times"  . Diabetes mellitus, type 2   . GERD (gastroesophageal reflux disease)   . Arthritis     "fingers, feet; all over me" (01/25/2014)    History  Substance Use Topics  . Smoking status: Former Smoker -- 0.50 packs/day for 58 years    Types: Cigarettes    Quit date: 07/26/2010  . Smokeless tobacco: Never Used  . Alcohol Use: No    Family History  Problem Relation Age of Onset  . Coronary artery disease     Allergies  Allergen Reactions  . Azithromycin     Per MAR  . Penicillins  Tolerated Rocephin (July 2015)  . Tetracycline     Per MAR    OBJECTIVE: Blood pressure 175/50, pulse 65, temperature 97.8 F (36.6 C), temperature source Oral, resp. rate 15, height 5' 6.14" (1.68 m), weight 191 lb 2.2 oz (86.7 kg), SpO2 100.00%. General: awake, alert, nad Skin: no rashes Lungs: CTA B Cor: RRR Abdomen: soft, nt, nd Ext: left leg with bandage, no surround erythema around bandage  Microbiology: Recent Results (from the past 240 hour(s))  SURGICAL PCR SCREEN     Status: Abnormal   Collection Time    03/12/14  4:01 AM      Result Value Ref Range Status   MRSA, PCR NEGATIVE  NEGATIVE Final   Staphylococcus aureus POSITIVE (*) NEGATIVE Final   Comment:            The Xpert SA Assay (FDA     approved for NASAL specimens     in patients over 28 years of age),     is one component of     a comprehensive surveillance      program.  Test performance has     been validated by [employer] for patients greater     than or equal to 45 year old.     It is not intended     to diagnose infection nor to     guide or monitor treatment.    Staci Righter, MD [facility] [email or web address] [phone] pager  [phone] cell 03/12/2014, 1:47 PM

## 2014-03-12 NOTE — H&P (View-Only) (Signed)
ORTHOPAEDIC CONSULTATION  REQUESTING PHYSICIAN: Janece Canterbury, MD  Chief Complaint: 4wks s/p Left supracondylar femur fx ORIF with a draining wound  HPI: Brenda Mcdonald is a 73 y.o. female who complains of  Wound drainage.   Past Medical History  Diagnosis Date  . COPD (chronic obstructive pulmonary disease)   . Bipolar disorder   . TIA (transient ischemic attack)     "a few"  . Dysphagia   . Essential hypertension, benign   . Coronary atherosclerosis of native coronary artery     Nonobstructive  . Pulmonary nodule July 2009    Nonspecific 5 mm right middle lobe  . Complication of anesthesia     "hard to wake me up after knee replacement"  . Anxiety   . Depression   . Asthma   . Pneumonia     "been in hospital for pneumonia several times"  . Diabetes mellitus, type 2   . GERD (gastroesophageal reflux disease)   . Arthritis     "fingers, feet; all over me" (01/25/2014)   Past Surgical History  Procedure Laterality Date  . Appendectomy    . Cholecystectomy    . Vesicovaginal fistula closure w/ tah    . Total knee arthroplasty Left 2000  . Knee arthroscopy Left   . Orif distal femur fracture Left 01/24/2014  . Abdominal hysterectomy    . Dilation and curettage of uterus    . Cardiac catheterization  11/2010  . Tibia fracture surgery Left ~ 1959  . Cataract extraction, bilateral Bilateral   . Orif femur fracture Left 01/24/2014    Procedure: OPEN REDUCTION INTERNAL FIXATION (ORIF) DISTAL FEMUR FRACTURE;  Surgeon: Renette Butters, MD;  Location: [place];  Service: Orthopedics;  Laterality: Left;   History   Social History  . Marital Status: Married    Spouse Name: N/A    Number of Children: N/A  . Years of Education: N/A   Social History Main Topics  . Smoking status: Former Smoker -- 0.50 packs/day for 58 years    Types: Cigarettes    Quit date: 07/26/2010  . Smokeless tobacco: Never Used  . Alcohol Use: No  . Drug Use: No  . Sexual Activity: No   Other  Topics Concern  . Not on file   Social History Narrative  . No narrative on file   Family History  Problem Relation Age of Onset  . Coronary artery disease     Allergies  Allergen Reactions  . Azithromycin     Per MAR  . Penicillins     Tolerated Rocephin (July 2015)  . Tetracycline     Per MAR   Prior to Admission medications   Medication Sig Start Date End Date Taking? Authorizing Provider  albuterol (PROVENTIL HFA;VENTOLIN HFA) 108 (90 BASE) MCG/ACT inhaler Inhale 2 puffs into the lungs every 6 (six) hours as needed for wheezing or shortness of breath.   Yes Historical Provider, MD  ALPRAZolam (XANAX) 0.25 MG tablet Take 0.25-0.5 mg by mouth 2 (two) times daily. Patient takes 1 tablet in the morning and 2 tablets at bedtime   Yes Historical Provider, MD  amLODipine (NORVASC) 10 MG tablet Take 10 mg by mouth daily.   Yes Historical Provider, MD  aspirin EC 325 MG tablet Take 325 mg by mouth daily.   Yes Historical Provider, MD  atorvastatin (LIPITOR) 10 MG tablet Take 10 mg by mouth at bedtime.   Yes Historical Provider, MD  calcium-vitamin D Darron Doom  WITH D) 500-200 MG-UNIT per tablet Take 1 tablet by mouth 2 (two) times daily.   Yes Historical Provider, MD  cholecalciferol (VITAMIN D) 1000 UNITS tablet Take 1,000 Units by mouth daily.   Yes Historical Provider, MD  HYDROcodone-acetaminophen (NORCO/VICODIN) 5-325 MG per tablet Take 1 tablet by mouth every 4 (four) hours as needed for moderate pain.   Yes Historical Provider, MD  ipratropium-albuterol (DUONEB) 0.5-2.5 (3) MG/3ML SOLN Take 3 mLs by nebulization 4 (four) times daily as needed. Shortness of breath   Yes Historical Provider, MD  lamoTRIgine (LAMICTAL) 200 MG tablet Take 200 mg by mouth daily.     Yes Historical Provider, MD  lisinopril (PRINIVIL,ZESTRIL) 20 MG tablet Take 20 mg by mouth daily.     Yes Historical Provider, MD  metFORMIN (GLUCOPHAGE) 500 MG tablet Take 500 mg by mouth every evening.    Yes Historical  Provider, MD  metoprolol tartrate (LOPRESSOR) 25 MG tablet Take 12.5 mg by mouth 2 (two) times daily.   Yes Historical Provider, MD  Multiple Vitamins-Minerals (MULTIVITAMIN PO) Take 1 tablet by mouth daily.   Yes Historical Provider, MD  omeprazole (PRILOSEC) 40 MG capsule Take 40 mg by mouth daily.  10/03/12  Yes Historical Provider, MD  QUEtiapine (SEROQUEL) 25 MG tablet Take 25 mg by mouth at bedtime.     Yes Historical Provider, MD  sertraline (ZOLOFT) 100 MG tablet Take 100 mg by mouth 2 (two) times daily.    Yes Historical Provider, MD  solifenacin (VESICARE) 10 MG tablet Take 10 mg by mouth daily.     Yes Historical Provider, MD  tiotropium (SPIRIVA) 18 MCG inhalation capsule Place 18 mcg into inhaler and inhale daily.     Yes Historical Provider, MD   Dg Chest Port 1 View  03/11/2014   CLINICAL DATA:  Short of breath  EXAM: PORTABLE CHEST - 1 VIEW  COMPARISON:  01/25/2014  FINDINGS: The lungs remain clear without infiltrate or effusion. Negative for heart failure. Right lower lobe calcified granuloma is unchanged.  IMPRESSION: No active disease.   Electronically Signed   By: Franchot Gallo M.D.   On: 03/11/2014 15:37    Positive ROS: All other systems have been reviewed and were otherwise negative with the exception of those mentioned in the HPI and as above.  Labs cbc  Recent Labs  03/11/14 1605 03/12/14 0348  WBC 7.7 7.4  HGB 10.7* 10.0*  HCT 34.2* 30.8*  PLT 192 192    Labs inflam No results found for this basename: ESR, CRP,  in the last 72 hours  Labs coag No results found for this basename: INR, PT, PTT,  in the last 72 hours   Recent Labs  03/11/14 1402 03/11/14 1605 03/12/14 0348  NA 146  --  144  K 4.4  --  4.1  CL 106  --  106  CO2 28  --  27  GLUCOSE 118*  --  107*  BUN 22  --  20  CREATININE 1.08 1.14* 1.09  CALCIUM 9.3  --  8.9    Physical Exam: Filed Vitals:   03/12/14 0511  BP: 140/55  Pulse: 61  Temp: 98.2 F (36.8 C)  Resp:     General: Alert, no acute distress Cardiovascular: No pedal edema Respiratory: No cyanosis, no use of accessory musculature GI: No organomegaly, abdomen is soft and non-tender Skin: No lesions in the area of chief complaint other than those listed below in MSK exam.  Neurologic: Sensation intact  distally Lymphatic: No axillary or cervical lymphadenopathy  MUSCULOSKELETAL:  LLE: wiggles toes, purulent drainage from a 68m wound at surgical incision.  Other extremities are atraumatic with painless ROM and NVI.  Assessment: Surgical infection   Plan: I&D today Weight Bearing Status: NWB PT VTE px: SCD's and chemical post op.    MEdmonia Lynch D, MD Cell [phone]  03/12/2014 7:17 AM

## 2014-03-13 ENCOUNTER — Encounter (HOSPITAL_COMMUNITY): Payer: Self-pay

## 2014-03-13 DIAGNOSIS — Y849 Medical procedure, unspecified as the cause of abnormal reaction of the patient, or of later complication, without mention of misadventure at the time of the procedure: Secondary | ICD-10-CM

## 2014-03-13 LAB — BASIC METABOLIC PANEL
Anion gap: 10 (ref 5–15)
BUN: 16 mg/dL (ref 6–23)
CO2: 28 mEq/L (ref 19–32)
Calcium: 9.1 mg/dL (ref 8.4–10.5)
Chloride: 101 mEq/L (ref 96–112)
Creatinine, Ser: 1.13 mg/dL — ABNORMAL HIGH (ref 0.50–1.10)
GFR calc Af Amer: 54 mL/min — ABNORMAL LOW (ref >90)
GFR, EST NON AFRICAN AMERICAN: 47 mL/min — AB (ref >90)
GLUCOSE: 111 mg/dL — AB (ref 70–99)
Potassium: 4.1 mEq/L (ref 3.7–5.3)
Sodium: 139 mEq/L (ref 137–147)

## 2014-03-13 LAB — CBC
HCT: 32.7 % — ABNORMAL LOW (ref 36.0–46.0)
Hemoglobin: 10.3 g/dL — ABNORMAL LOW (ref 12.0–15.0)
MCH: 25.9 pg — ABNORMAL LOW (ref 26.0–34.0)
MCHC: 31.5 g/dL (ref 30.0–36.0)
MCV: 82.4 fL (ref 78.0–100.0)
Platelets: 197 10*3/uL (ref 150–400)
RBC: 3.97 MIL/uL (ref 3.87–5.11)
RDW: 17.1 % — ABNORMAL HIGH (ref 11.5–15.5)
WBC: 7.2 10*3/uL (ref 4.0–10.5)

## 2014-03-13 LAB — GLUCOSE, CAPILLARY
GLUCOSE-CAPILLARY: 108 mg/dL — AB (ref 70–99)
Glucose-Capillary: 129 mg/dL — ABNORMAL HIGH (ref 70–99)
Glucose-Capillary: 123 mg/dL — ABNORMAL HIGH (ref 70–99)
Glucose-Capillary: 96 mg/dL (ref 70–99)

## 2014-03-13 MED ORDER — LEVOFLOXACIN 500 MG PO TABS
500.0000 mg | ORAL_TABLET | Freq: Every day | ORAL | Status: DC
  Administered 2014-03-13 – 2014-03-14 (×2): 500 mg via ORAL
  Filled 2014-03-13 (×2): qty 1

## 2014-03-13 MED ORDER — DEXTROSE-NACL 5-0.45 % IV SOLN: 100.0000 mL/h | INTRAVENOUS | Status: DC

## 2014-03-13 NOTE — Progress Notes (Signed)
[facility]  Date of Admission:  03/11/2014  Antibiotics: vancomycin  Subjective: No complaints, no pain  Objective: Temp:  [97.6 F (36.4 C)-98.4 F (36.9 C)] 98.2 F (36.8 C) (08/19 1139) Pulse Rate:  [56-64] 56 (08/19 1139) Resp:  [12-18] 15 (08/19 1139) BP: (111-190)/(45-83) 112/50 mmHg (08/19 1139) SpO2:  [92 %-100 %] 96 % (08/19 1139)  General: awake, in chair, nad Skin: no rashes Lungs: CTA B Cor: RRR Abdomen: soft, nt, nd Ext: left leg with suction dressing in place  Lab Results Lab Results  Component Value Date   WBC 7.2 03/13/2014   HGB 10.3* 03/13/2014   HCT 32.7* 03/13/2014   MCV 82.4 03/13/2014   PLT 197 03/13/2014    Lab Results  Component Value Date   CREATININE 1.13* 03/13/2014   BUN 16 03/13/2014   NA 139 03/13/2014   K 4.1 03/13/2014   CL 101 03/13/2014   CO2 28 03/13/2014    Lab Results  Component Value Date   ALT 11 03/11/2014   AST 16 03/11/2014   ALKPHOS 127* 03/11/2014   BILITOT <0.2* 03/11/2014      Microbiology: Recent Results (from the past 240 hour(s))  SURGICAL PCR SCREEN     Status: Abnormal   Collection Time    03/12/14  4:01 AM      Result Value Ref Range Status   MRSA, PCR NEGATIVE  NEGATIVE Final   Staphylococcus aureus POSITIVE (*) NEGATIVE Final   Comment:            The Xpert SA Assay (FDA     approved for NASAL specimens     in patients over 53 years of age),     is one component of     a comprehensive surveillance     program.  Test performance has     been validated by [employer] for patients greater     than or equal to 85 year old.     It is not intended     to diagnose infection nor to     guide or monitor treatment.  ANAEROBIC CULTURE     Status: None   Collection Time    03/12/14  2:16 PM      Result Value Ref Range Status   Specimen Description TISSUE LEFT LEG   Final   Special Requests PATIENT ON FOLLOWING VANCO   Final   Gram Stain     Final   Value: NO WBC SEEN     NO  SQUAMOUS EPITHELIAL CELLS SEEN     NO ORGANISMS SEEN     Performed at [facility]   Culture     Final   Value: NO ANAEROBES ISOLATED; CULTURE IN PROGRESS FOR 5 DAYS     Performed at [facility]   Report Status PENDING   Incomplete  TISSUE CULTURE     Status: None   Collection Time    03/12/14  2:16 PM      Result Value Ref Range Status   Specimen Description TISSUE LEFT LEG   Final   Special Requests PATIENT ON FOLLOWING VANC   Final   Gram Stain     Final   Value: NO WBC SEEN     NO SQUAMOUS EPITHELIAL CELLS SEEN     NO ORGANISMS SEEN     Performed at [facility]   Culture     Final   Value:  NO GROWTH     Performed at [facility]   Report Status PENDING   Incomplete    Studies/Results: Dg Chest Port 1 View  03/11/2014   CLINICAL DATA:  Short of breath  EXAM: PORTABLE CHEST - 1 VIEW  COMPARISON:  01/25/2014  FINDINGS: The lungs remain clear without infiltrate or effusion. Negative for heart failure. Right lower lobe calcified granuloma is unchanged.  IMPRESSION: No active disease.   Electronically Signed   By: Marlan Palau M.D.   On: 03/11/2014 15:37    Assessment/Plan: 1) post op wound infection - gram stain negative. Wound was superficial to fascia, no penetration to fascia and muscle. Not in areas of hardware.   -I will continue with vancomycin, I will add levaquin, wait for culture -if it remains negative, I will treat with vancomycin and levaquin, 3-4 weeks minimum as long as healing goes well -I will hold on picc for another day to see if culture grows anything and is ameneable to oral therapy (ie fluoroquinolone for GNR)  Staci Righter, MD [facility] [email or web address] [phone] pager   [phone] cell 03/13/2014, 1:03 PM

## 2014-03-13 NOTE — Progress Notes (Signed)
OT Cancellation Note  Patient Details Name: Brenda Mcdonald MRN: [id number] DOB: 1940/10/06   Cancelled Treatment:    Reason Eval/Treat Not Completed: Other (comment) Pt is Medicare and current D/C plan is to return to SNF [facility]). No apparent immediate acute care OT needs, therefore will defer OT to SNF. If OT eval is needed please call Acute Rehab Dept. at [phone] or text page OT at [phone].   Rae Lips [phone] 03/13/2014, 8:19 AM

## 2014-03-13 NOTE — Evaluation (Signed)
Physical Therapy Evaluation Patient Details Name: Brenda Mcdonald MRN: [id number] DOB: 03/17/1941 Today's Date: 03/13/2014   History of Present Illness  73 y.o. female s/p LEFT INCISION/DRAINAGE DEEP ABSCESS/BURSA/HEMATOMA THIGH/KNEE REGION. Hx of COPD, TIA, and HTN  Clinical Impression  Pt admitted with/for surgical intervention above.  Pt currently limited functionally due to the problems listed. ( See problems list.)   Pt will benefit from PT to maximize function and safety in order to get ready for next venue listed below.     Follow Up Recommendations SNF    Equipment Recommendations  None recommended by PT    Recommendations for Other Services       Precautions / Restrictions Precautions Precautions: Fall Restrictions Weight Bearing Restrictions: Yes LLE Weight Bearing: Weight bearing as tolerated      Mobility  Bed Mobility Overal bed mobility: Needs Assistance Bed Mobility: Supine to Sit     Supine to sit: Min assist     General bed mobility comments: cues for technique and hand placement;  assist to help pt bring trunk forward and balance.  Transfers Overall transfer level: Needs assistance Equipment used: Rolling walker (2 wheeled) Transfers: Sit to/from Stand Sit to Stand: Min assist;Mod assist (depending on height of surface.)         General transfer comment: cues for hand placement and safety; lifting and forward assist  Ambulation/Gait Ambulation/Gait assistance: Min assist Ambulation Distance (Feet): 15 Feet (8' x2) Assistive device: Rolling walker (2 wheeled) Gait Pattern/deviations: Step-to pattern;Decreased stance time - left;Decreased step length - right;Decreased stride length;Narrow base of support Gait velocity: very slow   General Gait Details: Slow, unequal steps with minimal w/shift onto L LE  Stairs            Wheelchair Mobility    Modified Rankin (Stroke Patients Only)       Balance Overall balance assessment: Needs  assistance Sitting-balance support: Single extremity supported;Bilateral upper extremity supported Sitting balance-Leahy Scale: Poor (working toward fair) Sitting balance - Comments: tends to list posteriorly   Standing balance support: Bilateral upper extremity supported Standing balance-Leahy Scale: Poor Standing balance comment: lists posteriorly                             Pertinent Vitals/Pain Pain Assessment: No/denies pain    Home Living Family/patient expects to be discharged to:: Skilled nursing facility                      Prior Function Level of Independence: Needs assistance      ADL's / Homemaking Assistance Needed: Husband must assist with all ADLs        Hand Dominance        Extremity/Trunk Assessment               Lower Extremity Assessment: RLE deficits/detail;LLE deficits/detail RLE Deficits / Details: grossly weak and stiff at 4/5 LLE Deficits / Details: knee flexion to firm end point max 40*: strength at hip and ankle >= 3+/5     Communication   Communication: HOH  Cognition Arousal/Alertness: Awake/alert Behavior During Therapy: Flat affect;WFL for tasks assessed/performed Overall Cognitive Status: Impaired/Different from baseline Area of Impairment: Attention;Following commands;Safety/judgement;Problem solving   Current Attention Level: Selective   Following Commands: Follows one step commands with increased time Safety/Judgement: Decreased awareness of safety   Problem Solving: Slow processing;Requires tactile cues      General Comments  Exercises        Assessment/Plan    PT Assessment Patient needs continued PT services  PT Diagnosis Difficulty walking;Generalized weakness   PT Problem List Decreased strength;Decreased activity tolerance;Decreased balance;Decreased mobility;Decreased knowledge of use of DME;Decreased knowledge of precautions  PT Treatment Interventions DME instruction;Gait  training;Functional mobility training;Therapeutic activities;Balance training;Patient/family education   PT Goals (Current goals can be found in the Care Plan section) Acute Rehab PT Goals Patient Stated Goal: ultimately be able to get home PT Goal Formulation: With patient Time For Goal Achievement: 03/20/14 Potential to Achieve Goals: Good    Frequency Min 5X/week   Barriers to discharge        Co-evaluation               End of Session   Activity Tolerance: Patient tolerated treatment well;Patient limited by fatigue Patient left: in chair;with call bell/phone within reach;with nursing/sitter in room Nurse Communication: Mobility status;Weight bearing status         Time: 1000-1033 PT Time Calculation (min): 33 min   Charges:   PT Evaluation $Initial PT Evaluation Tier I: 1 Procedure PT Treatments $Gait Training: 8-22 mins $Therapeutic Activity: 8-22 mins   PT G Codes:          Kaziyah Parkison, Eliseo Gum 03/13/2014, 11:05 AM  03/13/2014  Macy Bing, PT [phone]  (pager)

## 2014-03-13 NOTE — Progress Notes (Signed)
I participated in the care of this patient and agree with the above history, physical and evaluation. I performed a review of the history and a physical exam as detailed   I am ok with d/c back to SNF when Abx plan is arranged. We can d/c suction dressing and place a dry dressing at that time.   Laqueta Due MD

## 2014-03-13 NOTE — Care Management Note (Signed)
CARE MANAGEMENT NOTE 03/13/2014  Patient:  Brenda Mcdonald, Brenda Mcdonald   Account Number:  [id number]  Date Initiated:  03/13/2014  Documentation initiated by:  Vance Peper  Subjective/Objective Assessment:   73 yr old female s/p I & D of left thigh abscess. Patient had Left femur ORIF 4 wks ago.     Action/Plan:   Patient is from [facility], will need skilled at discharge.Social worker is aware.Patient may discharge with PICC for IV antibiotics,  Case manager will follow.   Anticipated DC Date:     Anticipated DC Plan:  SKILLED NURSING FACILITY      DC Planning Services  CM consult      Choice offered to / List presented to:             Status of service:  In process, will continue to follow Medicare Important Message given?   (If response is "NO", the following Medicare IM given date fields will be blank) Date Medicare IM given:   Medicare IM given by:   Date Additional Medicare IM given:   Additional Medicare IM given by:    Discharge Disposition:    Per UR Regulation:  Reviewed for med. necessity/level of care/duration of stay

## 2014-03-13 NOTE — Progress Notes (Signed)
     Subjective:  Patient reports pain as mild.  Feeling well.  Sitting up in bed upon arrival.  No SOB or CP.  No complaints.  Discussed plan for continued abx therapy per ID recommendations.  Objective:   VITALS:   Filed Vitals:   03/12/14 2008 03/13/14 0202 03/13/14 0525 03/13/14 0756  BP: 138/54 142/52 136/55 139/46  Pulse: 62 63 63 60  Temp: 98 F (36.7 C) 98 F (36.7 C) 98 F (36.7 C) 98.1 F (36.7 C)  TempSrc:    Oral  Resp: 18 18 18 16   Height:      Weight:      SpO2: 94% 95% 95% 100%    ABD soft Sensation intact distally Dorsiflexion/Plantar flexion intact Incision: scant drainage Scant drainage in curlex dressing.  No drainage in suction tubing or canister  Lab Results  Component Value Date   WBC 7.2 03/13/2014   HGB 10.3* 03/13/2014   HCT 32.7* 03/13/2014   MCV 82.4 03/13/2014   PLT 197 03/13/2014     Assessment/Plan: 1 Day Post-Op   Principal Problem:   Cellulitis of drainage site following surgery Active Problems:   HYPERLIPIDEMIA, MIXED   Essential hypertension, benign   CORONARY ATHEROSCLEROSIS NATIVE CORONARY ARTERY   Diabetes   Advance diet Up with therapy Continue ABX per ID WBAT Suction dressing intact and active.   03/15/2014 03/13/2014, 8:01 AM   03/15/2014 MD [phone]

## 2014-03-13 NOTE — Progress Notes (Signed)
[facility] PROGRESS NOTE  Brenda Mcdonald MHD:[id number] DOB: [birth year] DOA: 03/11/2014 PCP: Ignatius Specking., MD  Assessment/Plan: 73 y.o. female with a Past Medical History of COPD, hypertension, DM type 2, and femur fracture s/p surgical repair on 7/2 who was seen at Dr Greig Right office for post hospital discharge at which time he noted discharge from the patient's left lateral thigh incision. Pt referred to the hospitalist service as a direct admit. Per patient and her caregiver's, she had no fever, nausea, vomiting however, she did have mild pain at the incision site. She underwent I&D on 8/18 by Dr. Eulah Pont and ID is consulting.   1. Suspected Infected Left Femur Surgical Site s/p femur fracture repair 7/2, clearly has not healed well/normally over last 1.5 months  - per ortho; s/p I&D 8/18; cont atx per ID; F/u wound culture   2. Hypertension, Cont amlodipine, metoprolol, lisinopril  3. Nonobstructive CAD with old left bundle branch block, EF in 2012 55-60%. With moderate aortic valve and mitral valve regurgitation  - Continue ASA, BB, statin  4. Bipolar Disorder, stable, continue Lamictal 200 mg daily, Seroquel 25 mg daily bedtime, and Zoloft 100 mg   5. DM Type 2, CBG somewhat low but wnl  - Continue low dose SSI AC  6. COPD, stable on RA, no SOB  - cont albuterol prn, spiriva, and inhalers  - significant hx of tobacco use  - O2 as needed    Code Status: full Family Communication: d/w patient  (indicate person spoken with, relationship, and if by phone, the number) Disposition Plan: home pend clinical improvement    Consultants:  Ortho, ID  Procedures:    Antibiotics: Vancomycin x 1 on 8/17 and resumed post-surgery on 8/18   (indicate start date, and stop date if known)  HPI/Subjective: alert  Objective: Filed Vitals:   03/13/14 1139  BP: 112/50  Pulse: 56  Temp: 98.2 F (36.8 C)  Resp: 15    Intake/Output Summary (Last 24 hours) at 03/13/14 1238 Last  data filed at 03/13/14 1000  Gross per 24 hour  Intake   2140 ml  Output    320 ml  Net   1820 ml   Filed Weights   03/11/14 1500  Weight: 86.7 kg (191 lb 2.2 oz)    Exam:   General:  alert  Cardiovascular: s1,s2 rrr  Respiratory: CTA BL  Abdomen: soft, nt,nd   Musculoskeletal: no pedal edema    Data Reviewed: Basic Metabolic Panel:  Recent Labs Lab 03/11/14 1402 03/11/14 1605 03/12/14 0348 03/13/14 0415  NA 146  --  144 139  K 4.4  --  4.1 4.1  CL 106  --  106 101  CO2 28  --  27 28  GLUCOSE 118*  --  107* 111*  BUN 22  --  20 16  CREATININE 1.08 1.14* 1.09 1.13*  CALCIUM 9.3  --  8.9 9.1   Liver Function Tests:  Recent Labs Lab 03/11/14 1402  AST 16  ALT 11  ALKPHOS 127*  BILITOT <0.2*  PROT 6.8  ALBUMIN 3.5   No results found for this basename: LIPASE, AMYLASE,  in the last 168 hours No results found for this basename: AMMONIA,  in the last 168 hours CBC:  Recent Labs Lab 03/11/14 1402 03/11/14 1605 03/12/14 0348 03/13/14 0415  WBC 8.0 7.7 7.4 7.2  HGB 11.1* 10.7* 10.0* 10.3*  HCT 35.3* 34.2* 30.8* 32.7*  MCV 82.9 82.4 81.7 82.4  PLT 201 192 192  197   Cardiac Enzymes: No results found for this basename: CKTOTAL, CKMB, CKMBINDEX, TROPONINI,  in the last 168 hours BNP (last 3 results) No results found for this basename: PROBNP,  in the last 8760 hours CBG:  Recent Labs Lab 03/12/14 1258 03/12/14 1442 03/12/14 2150 03/13/14 0637 03/13/14 1142  GLUCAP 102* 119* 107* 108* 129*    Recent Results (from the past 240 hour(s))  SURGICAL PCR SCREEN     Status: Abnormal   Collection Time    03/12/14  4:01 AM      Result Value Ref Range Status   MRSA, PCR NEGATIVE  NEGATIVE Final   Staphylococcus aureus POSITIVE (*) NEGATIVE Final   Comment:            The Xpert SA Assay (FDA     approved for NASAL specimens     in patients over 33 years of age),     is one component of     a comprehensive surveillance     program.  Test  performance has     been validated by [employer] for patients greater     than or equal to 60 year old.     It is not intended     to diagnose infection nor to     guide or monitor treatment.  ANAEROBIC CULTURE     Status: None   Collection Time    03/12/14  2:16 PM      Result Value Ref Range Status   Specimen Description TISSUE LEFT LEG   Final   Special Requests PATIENT ON FOLLOWING VANCO   Final   Gram Stain     Final   Value: NO WBC SEEN     NO SQUAMOUS EPITHELIAL CELLS SEEN     NO ORGANISMS SEEN     Performed at [facility]   Culture     Final   Value: NO ANAEROBES ISOLATED; CULTURE IN PROGRESS FOR 5 DAYS     Performed at [facility]   Report Status PENDING   Incomplete  TISSUE CULTURE     Status: None   Collection Time    03/12/14  2:16 PM      Result Value Ref Range Status   Specimen Description TISSUE LEFT LEG   Final   Special Requests PATIENT ON FOLLOWING VANC   Final   Gram Stain     Final   Value: NO WBC SEEN     NO SQUAMOUS EPITHELIAL CELLS SEEN     NO ORGANISMS SEEN     Performed at [facility]   Culture     Final   Value: NO GROWTH     Performed at [facility]   Report Status PENDING   Incomplete     Studies: Dg Chest Port 1 View  03/11/2014   CLINICAL DATA:  Short of breath  EXAM: PORTABLE CHEST - 1 VIEW  COMPARISON:  01/25/2014  FINDINGS: The lungs remain clear without infiltrate or effusion. Negative for heart failure. Right lower lobe calcified granuloma is unchanged.  IMPRESSION: No active disease.   Electronically Signed   By: Marlan Palau M.D.   On: 03/11/2014 15:37    Scheduled Meds: . ALPRAZolam  0.25 mg Oral Daily  . ALPRAZolam  0.5 mg Oral QHS  . amLODipine  10 mg Oral Daily  . aspirin EC  325 mg Oral Daily  . aspirin EC  325 mg Oral Daily  .  atorvastatin  10 mg Oral QHS  . chlorhexidine   Topical Once  . darifenacin  15 mg Oral Daily  . docusate sodium  100 mg Oral BID  . heparin  5,000 Units  Subcutaneous 3 times per day  . insulin aspart  0-9 Units Subcutaneous TID WC  . lamoTRIgine  200 mg Oral Daily  . lisinopril  20 mg Oral Daily  . lisinopril  20 mg Oral Once  . metoprolol tartrate  12.5 mg Oral BID  . mupirocin ointment  1 application Nasal BID  . pantoprazole  40 mg Oral Daily  . QUEtiapine  25 mg Oral QHS  . senna  2 tablet Oral QHS  . sertraline  100 mg Oral BID  . tiotropium  18 mcg Inhalation Daily  . vancomycin  1,500 mg Intravenous Q24H   Continuous Infusions: . sodium chloride 50 mL/hr at 03/13/14 0027  . dextrose 5 % and 0.45% NaCl      Principal Problem:   Cellulitis of drainage site following surgery Active Problems:   HYPERLIPIDEMIA, MIXED   Essential hypertension, benign   CORONARY ATHEROSCLEROSIS NATIVE CORONARY ARTERY   Diabetes    Time spent: >35 minutes     Esperanza Sheets  [facility] Pager [phone]. If 7PM-7AM, please contact night-coverage at [email or web address], password [facility] 03/13/2014, 12:38 PM  LOS: 2 days

## 2014-03-13 NOTE — Progress Notes (Signed)
ANTIBIOTIC CONSULT NOTE - FOLLOW UP  Pharmacy Consult for Vancomycin Indication: Wound Infection  Allergies  Allergen Reactions  . Azithromycin     Per MAR  . Penicillins     Tolerated Rocephin (July 2015)  . Tetracycline     Per [facility]    Patient Measurements: Height: 5' 6.14" (168 cm) Weight: 191 lb 2.2 oz (86.7 kg) (From 01/24/14) IBW/kg (Calculated) : 59.63 Adjusted Body Weight: n/a   Vital Signs: Temp: 98.2 F (36.8 C) (08/19 1139) Temp src: Oral (08/19 1139) BP: 112/50 mmHg (08/19 1139) Pulse Rate: 56 (08/19 1139) Intake/Output from previous day: 08/18 0701 - 08/19 0700 In: 1700 [I.V.:1700] Out: 860 [Urine:840; Blood:20] Intake/Output from this shift: Total I/O In: 680 [P.O.:680] Out: 300 [Urine:300]  Labs:  Recent Labs  03/11/14 1605 03/12/14 0348 03/13/14 0415  WBC 7.7 7.4 7.2  HGB 10.7* 10.0* 10.3*  PLT 192 192 197  CREATININE 1.14* 1.09 1.13*   Estimated Creatinine Clearance: 48.5 ml/min (by C-G formula based on Cr of 1.13). No results found for this basename: VANCOTROUGH, Leodis Binet, VANCORANDOM, GENTTROUGH, GENTPEAK, GENTRANDOM, TOBRATROUGH, TOBRAPEAK, TOBRARND, AMIKACINPEAK, AMIKACINTROU, AMIKACIN,  in the last 72 hours   Assessment: 18 YOF who is direct admit from orthopedic office due to infection of surgical site s/p femur fracture repair on 7/2. Currently on vancomycin for empiric therapy.  Now adding Levaquin for gram negative stain while awaiting cultures. WBC wnl. Pt is afebrile. CrCl ~ 50 mL/min.   Goal of Therapy:  Vancomycin trough level 10-15 mcg/ml  Plan:  -Continue Vancomycin 1500 mg IV Q 24 hours -Add Levaquin 500 mg PO daily.  -If cultures remain neg, plan is treat with both antibiotics x 3-4 weeks  -Monitor CBC, renal fx, cultures and patient's clinical progress -VT at [facility], PharmD.  Clinical Pharmacist Pager [phone]

## 2014-03-14 DIAGNOSIS — L02419 Cutaneous abscess of limb, unspecified: Secondary | ICD-10-CM

## 2014-03-14 DIAGNOSIS — A4901 Methicillin susceptible Staphylococcus aureus infection, unspecified site: Secondary | ICD-10-CM

## 2014-03-14 DIAGNOSIS — L03119 Cellulitis of unspecified part of limb: Secondary | ICD-10-CM

## 2014-03-14 LAB — CBC
HCT: 34 % — ABNORMAL LOW (ref 36.0–46.0)
Hemoglobin: 10.9 g/dL — ABNORMAL LOW (ref 12.0–15.0)
MCH: 26.4 pg (ref 26.0–34.0)
MCHC: 32.1 g/dL (ref 30.0–36.0)
MCV: 82.3 fL (ref 78.0–100.0)
PLATELETS: 189 10*3/uL (ref 150–400)
RBC: 4.13 MIL/uL (ref 3.87–5.11)
RDW: 17.1 % — AB (ref 11.5–15.5)
WBC: 7.7 10*3/uL (ref 4.0–10.5)

## 2014-03-14 LAB — GLUCOSE, CAPILLARY
GLUCOSE-CAPILLARY: 120 mg/dL — AB (ref 70–99)
Glucose-Capillary: 117 mg/dL — ABNORMAL HIGH (ref 70–99)
Glucose-Capillary: 123 mg/dL — ABNORMAL HIGH (ref 70–99)
Glucose-Capillary: 123 mg/dL — ABNORMAL HIGH (ref 70–99)

## 2014-03-14 LAB — BASIC METABOLIC PANEL
Anion gap: 10 (ref 5–15)
BUN: 18 mg/dL (ref 6–23)
CALCIUM: 9.5 mg/dL (ref 8.4–10.5)
CHLORIDE: 104 meq/L (ref 96–112)
CO2: 27 meq/L (ref 19–32)
Creatinine, Ser: 1.11 mg/dL — ABNORMAL HIGH (ref 0.50–1.10)
GFR calc Af Amer: 55 mL/min — ABNORMAL LOW (ref >90)
GFR, EST NON AFRICAN AMERICAN: 48 mL/min — AB (ref >90)
Glucose, Bld: 105 mg/dL — ABNORMAL HIGH (ref 70–99)
POTASSIUM: 4 meq/L (ref 3.7–5.3)
SODIUM: 141 meq/L (ref 137–147)

## 2014-03-14 NOTE — Progress Notes (Signed)
I participated in the care of this patient and agree with the above history, physical and evaluation. I performed a review of the history and a physical exam as detailed   Aron Inge Daniel Brelan Hannen MD  

## 2014-03-14 NOTE — Progress Notes (Signed)
[facility]  Date of Admission:  03/11/2014  Antibiotics: vancomycin  Subjective: No complaints, no pain  Objective: Temp:  [97.5 F (36.4 C)-98 F (36.7 C)] 98 F (36.7 C) (08/20 1300) Pulse Rate:  [58-65] 62 (08/20 1300) Resp:  [16] 16 (08/20 1300) BP: (142-168)/(44-54) 150/54 mmHg (08/20 1300) SpO2:  [99 %-100 %] 99 % (08/20 1300)  General: awake, in chair, nad Skin: no rashes Lungs: CTA B Cor: RRR Abdomen: soft, nt, nd Ext: left leg with suction dressing in place  Lab Results Lab Results  Component Value Date   WBC 7.7 03/14/2014   HGB 10.9* 03/14/2014   HCT 34.0* 03/14/2014   MCV 82.3 03/14/2014   PLT 189 03/14/2014    Lab Results  Component Value Date   CREATININE 1.11* 03/14/2014   BUN 18 03/14/2014   NA 141 03/14/2014   K 4.0 03/14/2014   CL 104 03/14/2014   CO2 27 03/14/2014    Lab Results  Component Value Date   ALT 11 03/11/2014   AST 16 03/11/2014   ALKPHOS 127* 03/11/2014   BILITOT <0.2* 03/11/2014      Microbiology: Recent Results (from the past 240 hour(s))  SURGICAL PCR SCREEN     Status: Abnormal   Collection Time    03/12/14  4:01 AM      Result Value Ref Range Status   MRSA, PCR NEGATIVE  NEGATIVE Final   Staphylococcus aureus POSITIVE (*) NEGATIVE Final   Comment:            The Xpert SA Assay (FDA     approved for NASAL specimens     in patients over 79 years of age),     is one component of     a comprehensive surveillance     program.  Test performance has     been validated by [employer] for patients greater     than or equal to 54 year old.     It is not intended     to diagnose infection nor to     guide or monitor treatment.  ANAEROBIC CULTURE     Status: None   Collection Time    03/12/14  2:16 PM      Result Value Ref Range Status   Specimen Description TISSUE LEFT LEG   Final   Special Requests PATIENT ON FOLLOWING VANCO   Final   Gram Stain     Final   Value: NO WBC SEEN     NO  SQUAMOUS EPITHELIAL CELLS SEEN     NO ORGANISMS SEEN     Performed at [facility]   Culture     Final   Value: NO ANAEROBES ISOLATED; CULTURE IN PROGRESS FOR 5 DAYS     Performed at [facility]   Report Status PENDING   Incomplete  TISSUE CULTURE     Status: None   Collection Time    03/12/14  2:16 PM      Result Value Ref Range Status   Specimen Description TISSUE LEFT LEG   Final   Special Requests PATIENT ON FOLLOWING VANC   Final   Gram Stain     Final   Value: NO WBC SEEN     NO SQUAMOUS EPITHELIAL CELLS SEEN     NO ORGANISMS SEEN     Performed at [facility]   Culture     Final   Value:  FEW STAPHYLOCOCCUS AUREUS     Note: RIFAMPIN AND GENTAMICIN SHOULD NOT BE USED AS SINGLE DRUGS FOR TREATMENT OF STAPH INFECTIONS.     Performed at [facility]   Report Status PENDING   Incomplete    Studies/Results: No results found.  Assessment/Plan: 1) post op wound infection - growing Staph aureus.   -I will continue with vancomycin -I will order picc -can change to cefazolin if MSSA (has tolerated ceftriaxone) -treat 3-4 weeks if healing well through Sept 14 (4 weeks) Antibiotics per facility protocol - weekly cbc and cmp, vanco trough weekly if continues on vancomycin -trough goal for vancomycin of 12-15  Brenda Mcdonald, Brenda Maduro, MD [facility] [email or web address] [phone] pager   [phone] cell 03/14/2014, 3:41 PM

## 2014-03-14 NOTE — Clinical Social Work Psychosocial (Signed)
Clinical Social Work Department BRIEF PSYCHOSOCIAL ASSESSMENT 03/14/2014  Patient:  Brenda Mcdonald, Brenda Mcdonald     Account Number:  [id number]     Admit date:  03/11/2014  Clinical Social Worker:  Read Drivers  Date/Time:  03/14/2014 12:34 PM  Referred by:  Physician  Date Referred:  03/14/2014 Referred for  Psychosocial assessment  SNF Placement   Other Referral:   Pt from SNF   Interview type:  Patient Other interview type:   none    PSYCHOSOCIAL DATA Living Status:  FACILITY Admitted from facility:  [facility] Level of care:  Skilled Nursing Facility Primary support name:  Lorie Primary support relationship to patient:  CHILD, ADULT Degree of support available:   adequate    CURRENT CONCERNS Current Concerns  Post-Acute Placement   Other Concerns:   none    SOCIAL WORK ASSESSMENT / PLAN CSW assessed pt at bedside. Pt is alert and oriented x4. Pt conversations were coherent and logical throughout the assessment. Pt reports being from [facility] prior to dc. Pt states that upon being medicallly stable and ready for dc the pt will return to [facility].  Pt states that prior to SNF she was living at home with her husband.  Pt states that husband still lives independently at home.  Pt also lists her daughter, Laveda Abbe, as a main support.  Pt reports she is happy with the level of care she has been receiving at [facility].  CSW contacted [facility] to confirm that pt is eligible to return.   Assessment/plan status:  Psychosocial Support/Ongoing Assessment of Needs Other assessment/ plan:   FL2  PASARR (should be exisiting)   Information/referral to community resources:   SNF    PATIENT'S/FAMILY'S RESPONSE TO PLAN OF CARE: Pt was appreciative of CSW assistance and agreeable to return to SNF once medically discharged.       Vickii Penna, LCSWA [phone]  Clinical Social Work

## 2014-03-14 NOTE — Progress Notes (Signed)
Physical Therapy Treatment Patient Details Name: Brenda Mcdonald MRN: [id number] DOB: 1940/08/12 Today's Date: 03/14/2014    History of Present Illness 72 y.o. female s/p LEFT INCISION/DRAINAGE DEEP ABSCESS/BURSA/HEMATOMA THIGH/KNEE REGION. Hx of COPD, TIA, and HTN    PT Comments    Patient eager to attempt ambulation and get out of bed this afternoon. Patient able to walk and increase weight bearing on LLE as ambulation progressed. Also worked on ROM of LLE in the recliner. Will continue with current POC  Follow Up Recommendations  SNF     Equipment Recommendations  None recommended by PT    Recommendations for Other Services       Precautions / Restrictions Precautions Precautions: Fall Restrictions LLE Weight Bearing: Weight bearing as tolerated    Mobility  Bed Mobility Overal bed mobility: Needs Assistance Bed Mobility: Supine to Sit     Supine to sit: Mod assist     General bed mobility comments: A for truncal support into upright sitting and for hip positioning  Transfers Overall transfer level: Needs assistance Equipment used: Rolling walker (2 wheeled) Transfers: Sit to/from Stand Sit to Stand: Min assist         General transfer comment: cues for hand placement and safety. A to power up into standing  Ambulation/Gait Ambulation/Gait assistance: Min assist Ambulation Distance (Feet): 25 Feet Assistive device: Rolling walker (2 wheeled) Gait Pattern/deviations: Step-to pattern;Decreased step length - right;Decreased step length - left Gait velocity: very slow   General Gait Details: Slow, unequal steps with minimal w/shift onto L LE.   Stairs            Wheelchair Mobility    Modified Rankin (Stroke Patients Only)       Balance                                    Cognition Arousal/Alertness: Awake/alert Behavior During Therapy: WFL for tasks assessed/performed Overall Cognitive Status: Within Functional Limits for  tasks assessed                      Exercises General Exercises - Lower Extremity Long Arc QuadBarbaraann Boys;Left;10 reps Heel Slides: AAROM;Left;10 reps Hip ABduction/ADduction: AAROM;Left;10 reps Straight Leg Raises: AAROM;Left;10 reps    General Comments        Pertinent Vitals/Pain Pain Assessment: No/denies pain    Home Living                      Prior Function            PT Goals (current goals can now be found in the care plan section) Progress towards PT goals: Progressing toward goals    Frequency  Min 5X/week    PT Plan Current plan remains appropriate    Co-evaluation             End of Session Equipment Utilized During Treatment: Gait belt Activity Tolerance: Patient tolerated treatment well Patient left: in chair;with call bell/phone within reach     Time: 1320-1347 PT Time Calculation (min): 27 min  Charges:  $Gait Training: 8-22 mins $Therapeutic Exercise: 8-22 mins                    G Codes:      Fredrich Birks 03/14/2014, 2:59 PM  03/14/2014 Fredrich Birks PTA [phone] pager [phone] office

## 2014-03-14 NOTE — Progress Notes (Signed)
Peripherally Inserted Central Catheter/Midline Placement  The IV Nurse has discussed with the patient and/or persons authorized to consent for the patient, the purpose of this procedure and the potential benefits and risks involved with this procedure.  The benefits include less needle sticks, lab draws from the catheter and patient may be discharged home with the catheter.  Risks include, but not limited to, infection, bleeding, blood clot (thrombus formation), and puncture of an artery; nerve damage and irregular heat beat.  Alternatives to this procedure were also discussed.  PICC/Midline Placement Documentation        Caterina Racine, Lajean Manes 03/14/2014, 5:36 PM

## 2014-03-14 NOTE — Progress Notes (Signed)
[facility] PROGRESS NOTE  Brenda Mcdonald PPI:[id number] DOB: 07/28/1940 DOA: 03/11/2014 PCP: Ignatius Specking., MD  Assessment/Plan: 73 y.o. female with a Past Medical History of COPD, hypertension, DM type 2, and femur fracture s/p surgical repair on 7/2 who was seen at Dr Greig Right office for post hospital discharge at which time he noted discharge from the patient's left lateral thigh incision. Pt referred to the hospitalist service as a direct admit. Per patient and her caregiver's, she had no fever, nausea, vomiting however, she did have mild pain at the incision site. She underwent I&D on 8/18 by Dr. Eulah Pont and ID is consulting.   1. Suspected Infected post op wound s/p femur fracture repair 7/2, clearly has not healed well/normally over last 1.5 months  - per ortho; s/p I&D 8/18; superficial wound, no penetration to fascia and muscle.  -per ID: vancomycin and levaquin, 3-4 weeks minimum ; wound culture NGTD   2. Hypertension, Cont amlodipine, metoprolol, lisinopril  3. Nonobstructive CAD with old left bundle branch block, EF in 2012 55-60%. With moderate aortic valve and mitral valve regurgitation  - Continue ASA, BB, statin  4. Bipolar Disorder, stable, continue Lamictal 200 mg daily, Seroquel 25 mg daily bedtime, and Zoloft 100 mg   5. DM Type 2, CBG somewhat low but wnl  - Continue low dose SSI AC  6. COPD, stable on RA, no SOB  - cont albuterol prn, spiriva, and inhalers  - significant hx of tobacco use  - O2 as needed    Code Status: full Family Communication: d/w patient  (indicate person spoken with, relationship, and if by phone, the number) Disposition Plan: home HHC at d/c per ID,ortho recommendations;    Consultants:  Ortho, ID  Procedures:    Antibiotics: Vancomycin x 1 on 8/17 and resumed post-surgery on 8/18   (indicate start date, and stop date if known)  HPI/Subjective: alert  Objective: Filed Vitals:   03/14/14 0519  BP: 168/54  Pulse: 65   Temp: 97.5 F (36.4 C)  Resp:     Intake/Output Summary (Last 24 hours) at 03/14/14 0818 Last data filed at 03/13/14 1914  Gross per 24 hour  Intake    920 ml  Output    300 ml  Net    620 ml   Filed Weights   03/11/14 1500  Weight: 86.7 kg (191 lb 2.2 oz)    Exam:   General:  alert  Cardiovascular: s1,s2 rrr  Respiratory: CTA BL  Abdomen: soft, nt,nd   Musculoskeletal: no pedal edema    Data Reviewed: Basic Metabolic Panel:  Recent Labs Lab 03/11/14 1402 03/11/14 1605 03/12/14 0348 03/13/14 0415 03/14/14 0500  NA 146  --  144 139 141  K 4.4  --  4.1 4.1 4.0  CL 106  --  106 101 104  CO2 28  --  27 28 27   GLUCOSE 118*  --  107* 111* 105*  BUN 22  --  20 16 18   CREATININE 1.08 1.14* 1.09 1.13* 1.11*  CALCIUM 9.3  --  8.9 9.1 9.5   Liver Function Tests:  Recent Labs Lab 03/11/14 1402  AST 16  ALT 11  ALKPHOS 127*  BILITOT <0.2*  PROT 6.8  ALBUMIN 3.5   No results found for this basename: LIPASE, AMYLASE,  in the last 168 hours No results found for this basename: AMMONIA,  in the last 168 hours CBC:  Recent Labs Lab 03/11/14 1402 03/11/14 1605 03/12/14 0348 03/13/14 0415 03/14/14  0500  WBC 8.0 7.7 7.4 7.2 7.7  HGB 11.1* 10.7* 10.0* 10.3* 10.9*  HCT 35.3* 34.2* 30.8* 32.7* 34.0*  MCV 82.9 82.4 81.7 82.4 82.3  PLT 201 192 192 197 189   Cardiac Enzymes: No results found for this basename: CKTOTAL, CKMB, CKMBINDEX, TROPONINI,  in the last 168 hours BNP (last 3 results) No results found for this basename: PROBNP,  in the last 8760 hours CBG:  Recent Labs Lab 03/13/14 0637 03/13/14 1142 03/13/14 1643 03/13/14 2138 03/14/14 0639  GLUCAP 108* 129* 123* 96 117*    Recent Results (from the past 240 hour(s))  SURGICAL PCR SCREEN     Status: Abnormal   Collection Time    03/12/14  4:01 AM      Result Value Ref Range Status   MRSA, PCR NEGATIVE  NEGATIVE Final   Staphylococcus aureus POSITIVE (*) NEGATIVE Final   Comment:             The Xpert SA Assay (FDA     approved for NASAL specimens     in patients over 11 years of age),     is one component of     a comprehensive surveillance     program.  Test performance has     been validated by [employer] for patients greater     than or equal to 29 year old.     It is not intended     to diagnose infection nor to     guide or monitor treatment.  ANAEROBIC CULTURE     Status: None   Collection Time    03/12/14  2:16 PM      Result Value Ref Range Status   Specimen Description TISSUE LEFT LEG   Final   Special Requests PATIENT ON FOLLOWING VANCO   Final   Gram Stain     Final   Value: NO WBC SEEN     NO SQUAMOUS EPITHELIAL CELLS SEEN     NO ORGANISMS SEEN     Performed at [facility]   Culture     Final   Value: NO ANAEROBES ISOLATED; CULTURE IN PROGRESS FOR 5 DAYS     Performed at [facility]   Report Status PENDING   Incomplete  TISSUE CULTURE     Status: None   Collection Time    03/12/14  2:16 PM      Result Value Ref Range Status   Specimen Description TISSUE LEFT LEG   Final   Special Requests PATIENT ON FOLLOWING VANC   Final   Gram Stain     Final   Value: NO WBC SEEN     NO SQUAMOUS EPITHELIAL CELLS SEEN     NO ORGANISMS SEEN     Performed at [facility]   Culture     Final   Value: NO GROWTH     Performed at [facility]   Report Status PENDING   Incomplete     Studies: No results found.  Scheduled Meds: . ALPRAZolam  0.25 mg Oral Daily  . ALPRAZolam  0.5 mg Oral QHS  . amLODipine  10 mg Oral Daily  . aspirin EC  325 mg Oral Daily  . atorvastatin  10 mg Oral QHS  . chlorhexidine   Topical Once  . darifenacin  15 mg Oral Daily  . docusate sodium  100 mg Oral BID  . heparin  5,000 Units Subcutaneous 3 times  per day  . insulin aspart  0-9 Units Subcutaneous TID WC  . lamoTRIgine  200 mg Oral Daily  . levofloxacin  500 mg Oral Daily  . lisinopril  20 mg Oral Daily  . metoprolol tartrate   12.5 mg Oral BID  . mupirocin ointment  1 application Nasal BID  . pantoprazole  40 mg Oral Daily  . QUEtiapine  25 mg Oral QHS  . senna  2 tablet Oral QHS  . sertraline  100 mg Oral BID  . tiotropium  18 mcg Inhalation Daily  . vancomycin  1,500 mg Intravenous Q24H   Continuous Infusions:    Principal Problem:   Cellulitis of drainage site following surgery Active Problems:   HYPERLIPIDEMIA, MIXED   Essential hypertension, benign   CORONARY ATHEROSCLEROSIS NATIVE CORONARY ARTERY   Diabetes    Time spent: >35 minutes     Esperanza SheetsBURIEV, Gracelin Weisberg N  [facility] Pager [phone]. If 7PM-7AM, please contact night-coverage at [email or web address], password [email or web address] 03/14/2014, 8:18 AM  LOS: 3 days

## 2014-03-14 NOTE — Progress Notes (Signed)
Patient ID: AALAYAH RILES, female   DOB: 04-Mar-1941, 73 y.o.   MRN: [id number]     Subjective:  Patient reports pain as mild.  Patient states that she is feeling much better.  Denies any CP or SOB  Objective:   VITALS:   Filed Vitals:   03/13/14 1139 03/13/14 1424 03/13/14 1959 03/14/14 0519  BP: 112/50 108/43 142/44 168/54  Pulse: 56 56 58 65  Temp: 98.2 F (36.8 C) 97.7 F (36.5 C) 97.7 F (36.5 C) 97.5 F (36.4 C)  TempSrc: Oral Oral Oral Axillary  Resp: 15 16    Height:      Weight:      SpO2: 96% 97% 100% 100%    ABD soft Sensation intact distally Dorsiflexion/Plantar flexion intact Incision: dressing C/D/I and scant drainage   Lab Results  Component Value Date   WBC 7.7 03/14/2014   HGB 10.9* 03/14/2014   HCT 34.0* 03/14/2014   MCV 82.3 03/14/2014   PLT 189 03/14/2014     Assessment/Plan: 2 Days Post-Op   Principal Problem:   Cellulitis of drainage site following surgery Active Problems:   HYPERLIPIDEMIA, MIXED   Essential hypertension, benign   CORONARY ATHEROSCLEROSIS NATIVE CORONARY ARTERY   Diabetes   Advance diet Up with therapy Continue plan per Medicine and ID WBAT Suction dressing until DC at which time can be removed and Dry dressing applied   Torrie Mayers, Maleia Weems 03/14/2014, 9:28 AM   Margarita Rana MD [phone]

## 2014-03-15 ENCOUNTER — Telehealth: Payer: Self-pay | Admitting: *Deleted

## 2014-03-15 DIAGNOSIS — F17200 Nicotine dependence, unspecified, uncomplicated: Secondary | ICD-10-CM

## 2014-03-15 LAB — TISSUE CULTURE: Gram Stain: NONE SEEN

## 2014-03-15 LAB — BASIC METABOLIC PANEL
Anion gap: 5 (ref 5–15)
BUN: 16 mg/dL (ref 6–23)
CO2: 28 mEq/L (ref 19–32)
Calcium: 9.6 mg/dL (ref 8.4–10.5)
Chloride: 107 mEq/L (ref 96–112)
Creatinine, Ser: 1.11 mg/dL — ABNORMAL HIGH (ref 0.50–1.10)
GFR calc non Af Amer: 48 mL/min — ABNORMAL LOW (ref >90)
GFR, EST AFRICAN AMERICAN: 55 mL/min — AB (ref >90)
GLUCOSE: 99 mg/dL (ref 70–99)
Potassium: 4.1 mEq/L (ref 3.7–5.3)
Sodium: 140 mEq/L (ref 137–147)

## 2014-03-15 LAB — CBC
HCT: 33.9 % — ABNORMAL LOW (ref 36.0–46.0)
Hemoglobin: 10.7 g/dL — ABNORMAL LOW (ref 12.0–15.0)
MCH: 26.5 pg (ref 26.0–34.0)
MCHC: 31.6 g/dL (ref 30.0–36.0)
MCV: 83.9 fL (ref 78.0–100.0)
Platelets: 193 10*3/uL (ref 150–400)
RBC: 4.04 MIL/uL (ref 3.87–5.11)
RDW: 17.2 % — ABNORMAL HIGH (ref 11.5–15.5)
WBC: 7.4 10*3/uL (ref 4.0–10.5)

## 2014-03-15 LAB — GLUCOSE, CAPILLARY
Glucose-Capillary: 92 mg/dL (ref 70–99)
Glucose-Capillary: 108 mg/dL — ABNORMAL HIGH (ref 70–99)

## 2014-03-15 MED ORDER — SENNA 8.6 MG PO TABS: 2.0000 | ORAL_TABLET | Freq: Every day | ORAL | Status: DC

## 2014-03-15 MED ORDER — CEFAZOLIN SODIUM-DEXTROSE 2-3 GM-% IV SOLR
2.0000 g | Freq: Three times a day (TID) | INTRAVENOUS | Status: DC
  Administered 2014-03-15: 2 g via INTRAVENOUS
  Filled 2014-03-15 (×4): qty 50

## 2014-03-15 MED ORDER — CHLORHEXIDINE GLUCONATE 4 % EX LIQD: Freq: Once | CUTANEOUS | Status: DC

## 2014-03-15 MED ORDER — GLIPIZIDE 5 MG PO TABS: 2.5000 mg | ORAL_TABLET | Freq: Every day | ORAL | Status: DC

## 2014-03-15 MED ORDER — CEFAZOLIN SODIUM-DEXTROSE 2-3 GM-% IV SOLR: 2.0000 g | Freq: Three times a day (TID) | INTRAVENOUS | Status: AC

## 2014-03-15 MED ORDER — HYDROCODONE-ACETAMINOPHEN 5-325 MG PO TABS: 1.0000 | ORAL_TABLET | ORAL | Status: AC | PRN

## 2014-03-15 MED ORDER — HEPARIN SOD (PORK) LOCK FLUSH 100 UNIT/ML IV SOLN
250.0000 [IU] | INTRAVENOUS | Status: AC | PRN
  Administered 2014-03-15: 250 [IU]

## 2014-03-15 MED ORDER — ALPRAZOLAM 0.25 MG PO TABS: 0.2500 mg | ORAL_TABLET | Freq: Two times a day (BID) | ORAL | Status: DC

## 2014-03-15 MED ORDER — SODIUM CHLORIDE 0.9 % IJ SOLN
10.0000 mL | INTRAMUSCULAR | Status: DC | PRN
  Administered 2014-03-15: 10 mL

## 2014-03-15 NOTE — Progress Notes (Signed)
     Subjective:  Feeling well today.  Patient reports pain as mild.  Sleeping upon arrival.  Awoke, sat up alert and oriented.  No SOB, CP.  Objective:   VITALS:   Filed Vitals:   03/14/14 0519 03/14/14 1300 03/14/14 2005 03/15/14 0557  BP: 168/54 150/54 141/57 136/54  Pulse: 65 62 66 58  Temp: 97.5 F (36.4 C) 98 F (36.7 C) 97.8 F (36.6 C) 97.7 F (36.5 C)  TempSrc: Axillary  Oral Oral  Resp:  16    Height:      Weight:      SpO2: 100% 99% 95% 96%    ABD soft Sensation intact distally Dorsiflexion/Plantar flexion intact Incision: scant drainage PICC in place without sign of infection Suction dressing scant drainage / active.  Suction dressing removed today (03/15/14).  Incision C/D/I without sign of infection, with nylon sutures in place.  Lab Results  Component Value Date   WBC 7.4 03/15/2014   HGB 10.7* 03/15/2014   HCT 33.9* 03/15/2014   MCV 83.9 03/15/2014   PLT 193 03/15/2014     Assessment/Plan: 3 Days Post-Op   Principal Problem:   Cellulitis of drainage site following surgery Active Problems:   HYPERLIPIDEMIA, MIXED   Essential hypertension, benign   CORONARY ATHEROSCLEROSIS NATIVE CORONARY ARTERY   Diabetes  Continue plan per Medicine and Infectious Disease Advance diet Up with therapy WBAT Dry Dressing PRN Okay for discharge from an orthopedic stand point.     Haskel Khan 03/15/2014, 7:25 AM   Margarita Rana MD [phone]

## 2014-03-15 NOTE — Progress Notes (Signed)
[facility]  Date of Admission:  03/11/2014  Antibiotics: Just changed to cefazolin  Subjective: No complaints  Objective: Temp:  [97.7 F (36.5 C)-98 F (36.7 C)] 97.7 F (36.5 C) (08/21 0557) Pulse Rate:  [58-66] 58 (08/21 0557) Resp:  [16] 16 (08/20 1300) BP: (136-150)/(54-57) 136/54 mmHg (08/21 0557) SpO2:  [95 %-99 %] 96 % (08/21 0557)  General: awake, in chair, nad Skin: no rashes Lungs: CTA B Cor: RRR Abdomen: soft, nt, nd Ext: left leg with suction dressing in place  Lab Results Lab Results  Component Value Date   WBC 7.4 03/15/2014   HGB 10.7* 03/15/2014   HCT 33.9* 03/15/2014   MCV 83.9 03/15/2014   PLT 193 03/15/2014    Lab Results  Component Value Date   CREATININE 1.11* 03/15/2014   BUN 16 03/15/2014   NA 140 03/15/2014   K 4.1 03/15/2014   CL 107 03/15/2014   CO2 28 03/15/2014    Lab Results  Component Value Date   ALT 11 03/11/2014   AST 16 03/11/2014   ALKPHOS 127* 03/11/2014   BILITOT <0.2* 03/11/2014      Microbiology: Recent Results (from the past 240 hour(s))  SURGICAL PCR SCREEN     Status: Abnormal   Collection Time    03/12/14  4:01 AM      Result Value Ref Range Status   MRSA, PCR NEGATIVE  NEGATIVE Final   Staphylococcus aureus POSITIVE (*) NEGATIVE Final   Comment:            The Xpert SA Assay (FDA     approved for NASAL specimens     in patients over 76 years of age),     is one component of     a comprehensive surveillance     program.  Test performance has     been validated by [employer] for patients greater     than or equal to 103 year old.     It is not intended     to diagnose infection nor to     guide or monitor treatment.  ANAEROBIC CULTURE     Status: None   Collection Time    03/12/14  2:16 PM      Result Value Ref Range Status   Specimen Description TISSUE LEFT LEG   Final   Special Requests PATIENT ON FOLLOWING VANCO   Final   Gram Stain     Final   Value: NO WBC SEEN     NO  SQUAMOUS EPITHELIAL CELLS SEEN     NO ORGANISMS SEEN     Performed at [facility]   Culture     Final   Value: NO ANAEROBES ISOLATED; CULTURE IN PROGRESS FOR 5 DAYS     Performed at [facility]   Report Status PENDING   Incomplete  TISSUE CULTURE     Status: None   Collection Time    03/12/14  2:16 PM      Result Value Ref Range Status   Specimen Description TISSUE LEFT LEG   Final   Special Requests PATIENT ON FOLLOWING VANC   Final   Gram Stain     Final   Value: NO WBC SEEN     NO SQUAMOUS EPITHELIAL CELLS SEEN     NO ORGANISMS SEEN     Performed at [facility]     Final  Value: FEW STAPHYLOCOCCUS AUREUS     Note: RIFAMPIN AND GENTAMICIN SHOULD NOT BE USED AS SINGLE DRUGS FOR TREATMENT OF STAPH INFECTIONS.     Performed at [facility]   Report Status 03/15/2014 FINAL   Final   Organism ID, Bacteria STAPHYLOCOCCUS AUREUS   Final    Studies/Results: No results found.  Assessment/Plan: 1) post op wound infection - growing Staph aureus, MSSA  -now on cefazolin -treat 3-4 weeks if healing well through Sept 14 (4 weeks) Antibiotics per facility protocol - weekly cbc and cmp  we will arrange followup in about 2 weeks in [facility]  Shirrell Solinger, Molly Maduro, MD [facility] [email or web address] [phone] pager   [phone] cell 03/15/2014, 10:20 AM

## 2014-03-15 NOTE — Clinical Social Work Note (Signed)
Pt to dc to [facility] RN to call report to [phone] Transportation: [facility]  CSW confirmed with SNF for pt to return.  DC summary hardfaxed to facility.  Family updated by Luster Landsberg, MD Case Manager.  CSW contacted [facility] and scheduled transportation at 1pm per RN request.  Vickii Penna, LCSWA [phone]  Clinical Social Work

## 2014-03-15 NOTE — Telephone Encounter (Signed)
The patient is in patient and they need stop dates for her Ancef and Hibiclens. She advised she can be reached at [phone] and if she is not available we can give information to whatever nurse is taking care of the patient.

## 2014-03-15 NOTE — Progress Notes (Signed)
Physical Therapy Treatment Patient Details Name: ANNITA RATLIFF MRN: [id number] DOB: May 30, 1941 Today's Date: 03/15/2014    History of Present Illness 73 y.o. female s/p LEFT INCISION/DRAINAGE DEEP ABSCESS/BURSA/HEMATOMA THIGH/KNEE REGION. Hx of COPD, TIA, and HTN    PT Comments    Focused on ambulation this session. Patient able to progress with some increased time. Appears more confident with ambulation today. Planning to DC to SNF later this afternoon  Follow Up Recommendations  SNF     Equipment Recommendations  None recommended by PT    Recommendations for Other Services       Precautions / Restrictions Precautions Precautions: Fall Restrictions Weight Bearing Restrictions: Yes LLE Weight Bearing: Weight bearing as tolerated    Mobility  Bed Mobility               General bed mobility comments: Patient up in recliner at start of session  Transfers Overall transfer level: Needs assistance Equipment used: Rolling walker (2 wheeled)   Sit to Stand: Min assist         General transfer comment: cues for hand placement and safety. A to ensure balance  Ambulation/Gait Ambulation/Gait assistance: Min guard Ambulation Distance (Feet): 70 Feet Assistive device: Rolling walker (2 wheeled) Gait Pattern/deviations: Step-to pattern;Decreased step length - right;Decreased step length - left Gait velocity: decreased   General Gait Details: Cues for sequence and to increase L step length. required one seated rest break   Stairs            Wheelchair Mobility    Modified Rankin (Stroke Patients Only)       Balance                                    Cognition Arousal/Alertness: Awake/alert Behavior During Therapy: WFL for tasks assessed/performed Overall Cognitive Status: Within Functional Limits for tasks assessed                      Exercises      General Comments        Pertinent Vitals/Pain Pain Assessment:  No/denies pain    Home Living                      Prior Function            PT Goals (current goals can now be found in the care plan section) Progress towards PT goals: Progressing toward goals    Frequency  Min 5X/week    PT Plan Current plan remains appropriate    Co-evaluation             End of Session Equipment Utilized During Treatment: Gait belt Activity Tolerance: Patient tolerated treatment well Patient left: in chair;with call bell/phone within reach (in bathroom)     Time: 0940-1005 PT Time Calculation (min): 25 min  Charges:  $Gait Training: 23-37 mins                    G Codes:      Fredrich Birks 03/15/2014, 10:38 AM 03/15/2014 Fredrich Birks PTA [phone] pager [phone] office

## 2014-03-15 NOTE — Discharge Instructions (Signed)
Please follow up with orthopedic surgery in 2 week  Please follow up with infectious disease specialist in 3-4 weeks

## 2014-03-15 NOTE — Discharge Summary (Signed)
Physician Discharge Summary  Brenda Mcdonald ZTI:[id number] DOB: October 08, 1940 DOA: 03/11/2014  PCP: Ignatius Specking., MD  Admit date: 03/11/2014 Discharge date: 03/15/2014  Time spent: >35 minutes  Recommendations for Outpatient Follow-up:  SNF F/u with ID in 3-4 weeks F/u with ortho in 10 days   Discharge Diagnoses:  Principal Problem:   Cellulitis of drainage site following surgery Active Problems:   HYPERLIPIDEMIA, MIXED   Essential hypertension, benign   CORONARY ATHEROSCLEROSIS NATIVE CORONARY ARTERY   Diabetes   Discharge Condition: stable   Diet recommendation: DM  Filed Weights   03/11/14 1500  Weight: 86.7 kg (191 lb 2.2 oz)    History of present illness:  73 y.o. female with a Past Medical History of COPD, hypertension, DM type 2, and femur fracture s/p surgical repair on 7/2 who was seen at Dr Greig Right office for post hospital discharge at which time he noted discharge from the patient's left lateral thigh incision. Pt referred to the hospitalist service as a direct admit. Per patient and her caregiver's, she had no fever, nausea, vomiting however, she did have mild pain at the incision site. She underwent I&D on 8/18 by Dr. Eulah Pont and ID is consulting.    Hospital Course:  1. Suspected Infected post op wound s/p femur fracture repair 7/2, clearly has not healed well/normally over last 1.5 months  -s/p I&D 8/18 per ortho; superficial wound, no penetration to fascia and muscle.  -per ID: cont cefazolin, last dose 04/08/2014; would culture +staph; Pt tolerated ceftriaxone without reaction  -Antibiotics per facility protocol - weekly cbc and cmp; outpatient ortho, ID follow up   2. Hypertension, Cont amlodipine, metoprolol, lisinopril  3. Nonobstructive CAD with old left bundle branch block, EF in 2012 55-60%. With moderate aortic valve and mitral valve regurgitation  - Continue ASA, BB, statin  4. Bipolar Disorder, stable, continue Lamictal 200 mg daily, Seroquel 25 mg  daily bedtime, and Zoloft 100 mg  5. DM Type 2, hold metformin due to previously elevated Cr; start low dose glipizide; cont outpatient follow up  6. COPD, stable on RA, no SOB  - cont albuterol prn, spiriva, and inhalers  - significant hx of tobacco use    Procedures:  I&D (i.e. Studies not automatically included, echos, thoracentesis, etc; not x-rays)  Consultations:  Ortho, ID  Discharge Exam: Filed Vitals:   03/15/14 0557  BP: 136/54  Pulse: 58  Temp: 97.7 F (36.5 C)  Resp:     General: alert Cardiovascular: s1,s2 rrr Respiratory: CTA BL  Discharge Instructions  Discharge Instructions   Diet - low sodium heart healthy    Complete by:  As directed      Discharge instructions    Complete by:  As directed   Please follow up with orthopedic surgery in 2 week  Please follow up with infectious disease specialist in 3-4 weeks     Increase activity slowly    Complete by:  As directed             Medication List    STOP taking these medications       metFORMIN 500 MG tablet  Commonly known as:  GLUCOPHAGE      TAKE these medications       albuterol 108 (90 BASE) MCG/ACT inhaler  Commonly known as:  PROVENTIL HFA;VENTOLIN HFA  Inhale 2 puffs into the lungs every 6 (six) hours as needed for wheezing or shortness of breath.     ALPRAZolam 0.25 MG tablet  Commonly  known as:  XANAX  Take 1-2 tablets (0.25-0.5 mg total) by mouth 2 (two) times daily. Patient takes 1 tablet in the morning and 2 tablets at bedtime     amLODipine 10 MG tablet  Commonly known as:  NORVASC  Take 10 mg by mouth daily.     aspirin EC 325 MG tablet  Take 325 mg by mouth daily.     atorvastatin 10 MG tablet  Commonly known as:  LIPITOR  Take 10 mg by mouth at bedtime.     calcium-vitamin D 500-200 MG-UNIT per tablet  Commonly known as:  OSCAL WITH D  Take 1 tablet by mouth 2 (two) times daily.     ceFAZolin 2-3 GM-% Solr  Commonly known as:  ANCEF  Inject 50 mLs (2 g total)  into the vein every 8 (eight) hours.     chlorhexidine 4 % external liquid  Commonly known as:  HIBICLENS  Apply topically once.     cholecalciferol 1000 UNITS tablet  Commonly known as:  VITAMIN D  Take 1,000 Units by mouth daily.     DUONEB 0.5-2.5 (3) MG/3ML Soln  Generic drug:  ipratropium-albuterol  Take 3 mLs by nebulization 4 (four) times daily as needed. Shortness of breath     glipiZIDE 5 MG tablet  Commonly known as:  GLUCOTROL  Take 0.5 tablets (2.5 mg total) by mouth daily before breakfast.     HYDROcodone-acetaminophen 5-325 MG per tablet  Commonly known as:  NORCO/VICODIN  Take 1 tablet by mouth every 4 (four) hours as needed for moderate pain.     lamoTRIgine 200 MG tablet  Commonly known as:  LAMICTAL  Take 200 mg by mouth daily.     lisinopril 20 MG tablet  Commonly known as:  PRINIVIL,ZESTRIL  Take 20 mg by mouth daily.     metoprolol tartrate 25 MG tablet  Commonly known as:  LOPRESSOR  Take 12.5 mg by mouth 2 (two) times daily.     MULTIVITAMIN PO  Take 1 tablet by mouth daily.     omeprazole 40 MG capsule  Commonly known as:  PRILOSEC  Take 40 mg by mouth daily.     QUEtiapine 25 MG tablet  Commonly known as:  SEROQUEL  Take 25 mg by mouth at bedtime.     senna 8.6 MG Tabs tablet  Commonly known as:  SENOKOT  Take 2 tablets (17.2 mg total) by mouth at bedtime.     sertraline 100 MG tablet  Commonly known as:  ZOLOFT  Take 100 mg by mouth 2 (two) times daily.     solifenacin 10 MG tablet  Commonly known as:  VESICARE  Take 10 mg by mouth daily.     tiotropium 18 MCG inhalation capsule  Commonly known as:  SPIRIVA  Place 18 mcg into inhaler and inhale daily.       Allergies  Allergen Reactions  . Azithromycin     Per MAR  . Penicillins     Tolerated Rocephin (July 2015)  . Tetracycline     Per MAR       Follow-up Information   Follow up with MURPHY, TIMOTHY, D, MD In 2 weeks.   Specialty:  Orthopedic Surgery   Contact  information:   [address] [place] [address] [phone]       Follow up with Staci Righter, MD In 3 weeks.   Specialty:  Infectious Diseases   Contact information:   [address] [place] [address] [phone]        The results of significant diagnostics from this hospitalization (including imaging, microbiology, ancillary and laboratory) are listed below for reference.    Significant Diagnostic Studies: Dg Chest Port 1 View  03/11/2014   CLINICAL DATA:  Short of breath  EXAM: PORTABLE CHEST - 1 VIEW  COMPARISON:  01/25/2014  FINDINGS: The lungs remain clear without infiltrate or effusion. Negative for heart failure. Right lower lobe calcified granuloma is unchanged.  IMPRESSION: No active disease.   Electronically Signed   By: Marlan Palau M.D.   On: 03/11/2014 15:37    Microbiology: Recent Results (from the past 240 hour(s))  SURGICAL PCR SCREEN     Status: Abnormal   Collection Time    03/12/14  4:01 AM      Result Value Ref Range Status   MRSA, PCR NEGATIVE  NEGATIVE Final   Staphylococcus aureus POSITIVE (*) NEGATIVE Final   Comment:            The Xpert SA Assay (FDA     approved for NASAL specimens     in patients over 69 years of age),     is one component of     a comprehensive surveillance     program.  Test performance has     been validated by [employer] for patients greater     than or equal to 74 year old.     It is not intended     to diagnose infection nor to     guide or monitor treatment.  ANAEROBIC CULTURE     Status: None   Collection Time    03/12/14  2:16 PM      Result Value Ref Range Status   Specimen Description TISSUE LEFT LEG   Final   Special Requests PATIENT ON FOLLOWING VANCO   Final   Gram Stain     Final   Value: NO WBC SEEN     NO SQUAMOUS EPITHELIAL CELLS SEEN     NO ORGANISMS SEEN     Performed at [facility]   Culture     Final   Value: NO ANAEROBES ISOLATED; CULTURE  IN PROGRESS FOR 5 DAYS     Performed at [facility]   Report Status PENDING   Incomplete  TISSUE CULTURE     Status: None   Collection Time    03/12/14  2:16 PM      Result Value Ref Range Status   Specimen Description TISSUE LEFT LEG   Final   Special Requests PATIENT ON FOLLOWING VANC   Final   Gram Stain     Final   Value: NO WBC SEEN     NO SQUAMOUS EPITHELIAL CELLS SEEN     NO ORGANISMS SEEN     Performed at [facility]   Culture     Final   Value: FEW STAPHYLOCOCCUS AUREUS     Note: RIFAMPIN AND GENTAMICIN SHOULD NOT BE USED AS SINGLE DRUGS FOR TREATMENT OF STAPH INFECTIONS.     Performed at [facility]   Report Status 03/15/2014 FINAL   Final   Organism ID, Bacteria STAPHYLOCOCCUS AUREUS   Final     Labs: Basic Metabolic Panel:  Recent Labs Lab 03/11/14 1402 03/11/14 1605 03/12/14 0348 03/13/14 0415 03/14/14 0500 03/15/14 0500  NA 146  --  144 139 141 140  K 4.4  --  4.1 4.1 4.0 4.1  CL 106  --  106 101 104 107  CO2 28  --  27 28 27 28   GLUCOSE 118*  --  107* 111* 105* 99  BUN 22  --  20 16 18 16   CREATININE 1.08 1.14* 1.09 1.13* 1.11* 1.11*  CALCIUM 9.3  --  8.9 9.1 9.5 9.6   Liver Function Tests:  Recent Labs Lab 03/11/14 1402  AST 16  ALT 11  ALKPHOS 127*  BILITOT <0.2*  PROT 6.8  ALBUMIN 3.5   No results found for this basename: LIPASE, AMYLASE,  in the last 168 hours No results found for this basename: AMMONIA,  in the last 168 hours CBC:  Recent Labs Lab 03/11/14 1605 03/12/14 0348 03/13/14 0415 03/14/14 0500 03/15/14 0500  WBC 7.7 7.4 7.2 7.7 7.4  HGB 10.7* 10.0* 10.3* 10.9* 10.7*  HCT 34.2* 30.8* 32.7* 34.0* 33.9*  MCV 82.4 81.7 82.4 82.3 83.9  PLT 192 192 197 189 193   Cardiac Enzymes: No results found for this basename: CKTOTAL, CKMB, CKMBINDEX, TROPONINI,  in the last 168 hours BNP: BNP (last 3 results) No results found for this basename: PROBNP,  in the last 8760 hours CBG:  Recent Labs Lab  03/14/14 0639 03/14/14 1115 03/14/14 1618 03/14/14 2113 03/15/14 0609  GLUCAP 117* 123* 123* 120* 92       Signed:  Orion Vandervort N  [facility] 03/15/2014, 8:50 AM

## 2014-03-15 NOTE — Progress Notes (Signed)
PICC line, flushed with 10cc NS, GBR capped with Heparin 250 units. Brenda Mcdonald

## 2014-03-15 NOTE — Progress Notes (Signed)
Patient discharged to [facility]. Report called to Bonita Quin, RN at facility. PICC line heparin locked for discharge. Patient left unit in a stable condition via EMS.

## 2014-03-17 LAB — ANAEROBIC CULTURE: Gram Stain: NONE SEEN

## 2014-03-18 NOTE — Telephone Encounter (Signed)
They should have received a discharge summary that has the date on there.  Last day is Sept 14th for cefazolin.  I did not prescribe hibiclens. thanks

## 2014-03-19 NOTE — Progress Notes (Signed)
I participated in the care of this patient and agree with the above history, physical and evaluation. I performed a review of the history and a physical exam as detailed   Nayelly Laughman Daniel Rieley Hausman MD  

## 2014-03-22 NOTE — Telephone Encounter (Addendum)
Called nurse Long back several times and the line rings busy. Need to advise her of the stop date for Ancef and that Dr Luciana Axe did not give Rx for hibiclens but will wait for her to call back.

## 2014-04-09 ENCOUNTER — Encounter: Payer: Self-pay | Admitting: Internal Medicine

## 2014-04-09 ENCOUNTER — Ambulatory Visit (INDEPENDENT_AMBULATORY_CARE_PROVIDER_SITE_OTHER): Payer: Medicare Other | Admitting: Internal Medicine

## 2014-04-09 VITALS — BP 143/85 | HR 61 | Temp 97.6°F | Wt 186.0 lb

## 2014-04-09 DIAGNOSIS — IMO0001 Reserved for inherently not codable concepts without codable children: Secondary | ICD-10-CM

## 2014-04-09 DIAGNOSIS — T814XXD Infection following a procedure, subsequent encounter: Principal | ICD-10-CM

## 2014-04-09 DIAGNOSIS — Z5189 Encounter for other specified aftercare: Secondary | ICD-10-CM

## 2014-04-09 DIAGNOSIS — T8140XA Infection following a procedure, unspecified, initial encounter: Secondary | ICD-10-CM

## 2014-04-09 DIAGNOSIS — I2510 Atherosclerotic heart disease of native coronary artery without angina pectoris: Secondary | ICD-10-CM

## 2014-04-09 NOTE — Assessment & Plan Note (Signed)
Doing well with superficial wound infection with MSSA.  Completed 4 weeks IV ancef and no concerns on exam.  Will d/c antibiotics and have picc removed.  Follow up rPN

## 2014-04-09 NOTE — Progress Notes (Signed)
   Subjective:    Patient ID: Brenda Mcdonald, female    DOB: 1941/04/27, 73 y.o.   MRN: [id number]  HPI Brenda Mcdonald is a 73 y.o. female She has a history of PJ placed in 2000 of left knee then left femur fracture s/p ORIF with pinning, screws with plate on 6/0/10. After surgery she developed drainage from the wound which has not improved. No fever, no chills. Penicillin allergy but tolerates cephalosporins. Wound was superficial to muslce, culture grew MSSA and on Ancef through Sept 14th (yesterday).  No fever, no chills, no diarrhea   Getting pt   Review of Systems  Constitutional: Negative for fever and chills.  Gastrointestinal: Negative for diarrhea.  Musculoskeletal:       Some difficulty straightening leg  Skin: Negative for rash.       Objective:   Physical Exam  Constitutional: She appears well-developed and well-nourished.  Eyes: No scleral icterus.  Cardiovascular: Normal rate, regular rhythm and normal heart sounds.   No murmur heard. Pulmonary/Chest: Effort normal and breath sounds normal. No respiratory distress.  Skin: No rash noted.          Assessment & Plan:

## 2014-05-27 ENCOUNTER — Other Ambulatory Visit: Payer: Self-pay | Admitting: *Deleted

## 2014-05-27 MED ORDER — METOPROLOL TARTRATE 25 MG PO TABS: 12.5000 mg | ORAL_TABLET | Freq: Two times a day (BID) | ORAL | Status: DC

## 2014-05-27 MED ORDER — ATORVASTATIN CALCIUM 10 MG PO TABS: 10.0000 mg | ORAL_TABLET | Freq: Every day | ORAL | Status: DC

## 2014-08-08 ENCOUNTER — Encounter (HOSPITAL_COMMUNITY): Payer: Self-pay | Admitting: Orthopedic Surgery

## 2014-12-16 ENCOUNTER — Other Ambulatory Visit: Payer: Self-pay | Admitting: Cardiology

## 2015-01-15 ENCOUNTER — Other Ambulatory Visit: Payer: Self-pay | Admitting: Cardiology

## 2015-01-16 ENCOUNTER — Other Ambulatory Visit: Payer: Self-pay | Admitting: Cardiology

## 2015-01-16 ENCOUNTER — Other Ambulatory Visit: Payer: Self-pay | Admitting: *Deleted

## 2015-01-16 MED ORDER — METOPROLOL TARTRATE 25 MG PO TABS: 12.5000 mg | ORAL_TABLET | Freq: Two times a day (BID) | ORAL | Status: DC

## 2015-02-14 ENCOUNTER — Other Ambulatory Visit: Payer: Self-pay | Admitting: Cardiology

## 2015-02-25 ENCOUNTER — Ambulatory Visit (INDEPENDENT_AMBULATORY_CARE_PROVIDER_SITE_OTHER): Payer: Medicare Other | Admitting: Cardiology

## 2015-02-25 ENCOUNTER — Encounter: Payer: Self-pay | Admitting: Cardiology

## 2015-02-25 VITALS — BP 118/74 | HR 55 | Ht 65.0 in | Wt 200.8 lb

## 2015-02-25 DIAGNOSIS — I2510 Atherosclerotic heart disease of native coronary artery without angina pectoris: Secondary | ICD-10-CM

## 2015-02-25 DIAGNOSIS — I38 Endocarditis, valve unspecified: Secondary | ICD-10-CM

## 2015-02-25 DIAGNOSIS — I10 Essential (primary) hypertension: Secondary | ICD-10-CM | POA: Diagnosis not present

## 2015-02-25 NOTE — Progress Notes (Signed)
Cardiology Office Note  Date: 02/25/2015   ID: Brenda Mcdonald, DOB Feb 17, 1941, MRN [id number]  PCP: Ignatius Specking., MD  Primary Cardiologist: Nona Dell, MD   Chief Complaint  Patient presents with  . Coronary Artery Disease  . Valvular heart disease    History of Present Illness: Brenda Mcdonald is a 74 y.o. female last seen in April 2015. She is here today with her daytime assistant for a follow-up visit. I reviewed her interval history in EPIC including left femur fracture surgery and subsequent I&D due to infection last year. She had a wheelchair today. States she does some exercises with a walker as well as stationary leg stretches. She does not report any angina symptoms or increasing shortness of breath with typical ADLs, as otherwise fairly sedentary.  She continues to follow with Dr. Sherril Croon, just saw him recently per report. Follow-up ECG is outlined below. I reviewed her cardiac medications which are stable. We continue to follow her conservatively with history of nonobstructive CAD and valvular heart disease.   Past Medical History  Diagnosis Date  . COPD (chronic obstructive pulmonary disease)   . Bipolar disorder   . TIA (transient ischemic attack)   . Dysphagia   . Essential hypertension, benign   . Coronary atherosclerosis of native coronary artery     Nonobstructive  . Pulmonary nodule July 2009    Nonspecific 5 mm right middle lobe  . Anxiety   . Depression   . Pneumonia   . Diabetes mellitus, type 2   . GERD (gastroesophageal reflux disease)   . Arthritis     Current Outpatient Prescriptions  Medication Sig Dispense Refill  . albuterol (PROVENTIL HFA;VENTOLIN HFA) 108 (90 BASE) MCG/ACT inhaler Inhale 2 puffs into the lungs every 6 (six) hours as needed for wheezing or shortness of breath.    . ALPRAZolam (XANAX) 0.25 MG tablet Take 1-2 tablets (0.25-0.5 mg total) by mouth 2 (two) times daily. Patient takes 1 tablet in the morning and 2 tablets at  bedtime 30 tablet 0  . amLODipine (NORVASC) 10 MG tablet Take 10 mg by mouth daily.    Marland Kitchen atorvastatin (LIPITOR) 10 MG tablet TAKE (1) TABLET DAILY AT BEDTIME. 30 tablet 3  . glipiZIDE (GLUCOTROL XL) 2.5 MG 24 hr tablet Take 1/2 tab by mouth daily    . HYDROcodone-acetaminophen (NORCO/VICODIN) 5-325 MG per tablet Take 1 tablet by mouth every 4 (four) hours as needed for moderate pain. 30 tablet 0  . ipratropium-albuterol (DUONEB) 0.5-2.5 (3) MG/3ML SOLN Take 3 mLs by nebulization 4 (four) times daily as needed. Shortness of breath    . lamoTRIgine (LAMICTAL) 200 MG tablet Take 200 mg by mouth daily.      Marland Kitchen lisinopril (PRINIVIL,ZESTRIL) 20 MG tablet Take 20 mg by mouth daily.      . metoprolol tartrate (LOPRESSOR) 25 MG tablet Take 0.5 tablets (12.5 mg total) by mouth 2 (two) times daily. 14 tablet 0  . omeprazole (PRILOSEC) 40 MG capsule Take 40 mg by mouth daily.     . QUEtiapine (SEROQUEL) 25 MG tablet Take 25 mg by mouth at bedtime.      . sertraline (ZOLOFT) 100 MG tablet Take 100 mg by mouth 2 (two) times daily.     . solifenacin (VESICARE) 10 MG tablet Take 10 mg by mouth daily.       No current facility-administered medications for this visit.    Allergies:  Azithromycin; Penicillins; and Tetracycline   Social History: The  patient  reports that she quit smoking about 4 years ago. Her smoking use included Cigarettes. She has a 29 pack-year smoking history. She has never used smokeless tobacco. She reports that she does not drink alcohol or use illicit drugs.   ROS:  Please see the history of present illness. Otherwise, complete review of systems is positive for weight gain with inactivity.  All other systems are reviewed and negative.   Physical Exam: VS:  BP 118/74 mmHg  Pulse 55  Ht 5\' 5"  (1.651 m)  Wt 200 lb 12.8 oz (91.082 kg)  BMI 33.41 kg/m2  SpO2 99%, BMI Body mass index is 33.41 kg/(m^2).  Wt Readings from Last 3 Encounters:  02/25/15 200 lb 12.8 oz (91.082 kg)  04/09/14  186 lb (84.369 kg)  03/11/14 191 lb 2.2 oz (86.7 kg)     Elderly woman in no acute distress seated in wheelchair.  HEENT: Conjunctiva and lids normal, oropharynx with poor dentition.  Neck: Supple, no elevated JVP or thyromegaly.  Lungs: Diminished but clear breath sounds, non-labored.  Cardiac: Regular rate and rhythm with soft systolic murmur at the apex, no significant diastolic murmur, no S3.  Abdomen: Soft, nontender, bowel sounds present.  Extremities: Mild ankle edema, distal pulses diminished.    ECG: ECG is ordered today and shows sinus bradycardia with incomplete left bundle block as before.   Recent Labwork: 03/11/2014: ALT 11; AST 16 03/15/2014: BUN 16; Creatinine, Ser 1.11*; Hemoglobin 10.7*; Platelets 193; Potassium 4.1; Sodium 140   Other Studies Reviewed Today:  Echocardiogram in June 2012 demonstrated LVEF 55-60%, mildly thickened aortic valve with moderate aortic regurgitation, MAC with moderate mitral regurgitation, mild tricuspid regurgitation.  Assessment and Plan:  1. History of nonobstructive CAD, stable on medical therapy without active angina symptoms. No change in ECG.  2. Valvular heart disease including moderate mitral and aortic regurgitation. No change in examination, we are following this conservatively at this point.  3. Essential hypertension, blood pressure is well controlled.  Current medicines were reviewed with the patient today.   Orders Placed This Encounter  Procedures  . EKG 12-Lead    Disposition: FU with me in 1 year.   Signed, July 2012, MD, [facility] 02/25/2015 12:21 PM    [facility] at [facility] [address] [place], [place], [place] Phone: [phone]; Fax: [phone]

## 2015-02-25 NOTE — Patient Instructions (Signed)
Your physician wants you to follow-up in: 1 year with Dr. McDowell You will receive a reminder letter in the mail two months in advance. If you don't receive a letter, please call our office to schedule the follow-up appointment.  Your physician recommends that you continue on your current medications as directed. Please refer to the Current Medication list given to you today.  Thank you for choosing [facility]!!   

## 2015-04-14 ENCOUNTER — Emergency Department (HOSPITAL_COMMUNITY): Payer: Medicare Other

## 2015-04-14 ENCOUNTER — Emergency Department (HOSPITAL_COMMUNITY)
Admission: EM | Admit: 2015-04-14 | Discharge: 2015-04-14 | Disposition: A | Discharge status: Home / Self Care | Payer: Medicare Other | Attending: Emergency Medicine | Admitting: Emergency Medicine

## 2015-04-14 ENCOUNTER — Encounter (HOSPITAL_COMMUNITY): Payer: Self-pay | Admitting: Emergency Medicine

## 2015-04-14 DIAGNOSIS — Z880 Allergy status to penicillin: Secondary | ICD-10-CM | POA: Diagnosis not present

## 2015-04-14 DIAGNOSIS — Z79899 Other long term (current) drug therapy: Secondary | ICD-10-CM | POA: Diagnosis not present

## 2015-04-14 DIAGNOSIS — F319 Bipolar disorder, unspecified: Secondary | ICD-10-CM | POA: Diagnosis not present

## 2015-04-14 DIAGNOSIS — J449 Chronic obstructive pulmonary disease, unspecified: Secondary | ICD-10-CM | POA: Diagnosis not present

## 2015-04-14 DIAGNOSIS — Z8701 Personal history of pneumonia (recurrent): Secondary | ICD-10-CM | POA: Diagnosis not present

## 2015-04-14 DIAGNOSIS — Z8673 Personal history of transient ischemic attack (TIA), and cerebral infarction without residual deficits: Secondary | ICD-10-CM | POA: Diagnosis not present

## 2015-04-14 DIAGNOSIS — K219 Gastro-esophageal reflux disease without esophagitis: Secondary | ICD-10-CM | POA: Diagnosis not present

## 2015-04-14 DIAGNOSIS — E119 Type 2 diabetes mellitus without complications: Secondary | ICD-10-CM | POA: Diagnosis not present

## 2015-04-14 DIAGNOSIS — M1990 Unspecified osteoarthritis, unspecified site: Secondary | ICD-10-CM | POA: Insufficient documentation

## 2015-04-14 DIAGNOSIS — Z87891 Personal history of nicotine dependence: Secondary | ICD-10-CM | POA: Insufficient documentation

## 2015-04-14 DIAGNOSIS — F419 Anxiety disorder, unspecified: Secondary | ICD-10-CM | POA: Diagnosis not present

## 2015-04-14 DIAGNOSIS — Z9889 Other specified postprocedural states: Secondary | ICD-10-CM | POA: Insufficient documentation

## 2015-04-14 DIAGNOSIS — R4182 Altered mental status, unspecified: Secondary | ICD-10-CM | POA: Diagnosis present

## 2015-04-14 DIAGNOSIS — I2510 Atherosclerotic heart disease of native coronary artery without angina pectoris: Secondary | ICD-10-CM | POA: Insufficient documentation

## 2015-04-14 DIAGNOSIS — F0390 Unspecified dementia without behavioral disturbance: Secondary | ICD-10-CM | POA: Diagnosis not present

## 2015-04-14 LAB — BASIC METABOLIC PANEL
Anion gap: 7 (ref 5–15)
BUN: 16 mg/dL (ref 6–20)
CALCIUM: 8.5 mg/dL — AB (ref 8.9–10.3)
CO2: 27 mmol/L (ref 22–32)
CREATININE: 1.29 mg/dL — AB (ref 0.44–1.00)
Chloride: 104 mmol/L (ref 101–111)
GFR calc Af Amer: 46 mL/min — ABNORMAL LOW (ref >60)
GFR, EST NON AFRICAN AMERICAN: 39 mL/min — AB (ref >60)
GLUCOSE: 95 mg/dL (ref 65–99)
Potassium: 3.6 mmol/L (ref 3.5–5.1)
Sodium: 138 mmol/L (ref 135–145)

## 2015-04-14 LAB — CBC WITH DIFFERENTIAL/PLATELET
BASOS ABS: 0 10*3/uL (ref 0.0–0.1)
BASOS PCT: 0 %
EOS PCT: 2 %
Eosinophils Absolute: 0.2 10*3/uL (ref 0.0–0.7)
HCT: 35.4 % — ABNORMAL LOW (ref 36.0–46.0)
Hemoglobin: 11.3 g/dL — ABNORMAL LOW (ref 12.0–15.0)
Lymphocytes Relative: 22 %
Lymphs Abs: 2.3 10*3/uL (ref 0.7–4.0)
MCH: 26.8 pg (ref 26.0–34.0)
MCHC: 31.9 g/dL (ref 30.0–36.0)
MCV: 83.9 fL (ref 78.0–100.0)
MONO ABS: 0.8 10*3/uL (ref 0.1–1.0)
Monocytes Relative: 8 %
Neutro Abs: 7 10*3/uL (ref 1.7–7.7)
Neutrophils Relative %: 68 %
PLATELETS: 198 10*3/uL (ref 150–400)
RBC: 4.22 MIL/uL (ref 3.87–5.11)
RDW: 14.6 % (ref 11.5–15.5)
WBC: 10.3 10*3/uL (ref 4.0–10.5)

## 2015-04-14 MED ORDER — SODIUM CHLORIDE 0.9 % IV BOLUS (SEPSIS)
500.0000 mL | Freq: Once | INTRAVENOUS | Status: AC
  Administered 2015-04-14: 500 mL via INTRAVENOUS

## 2015-04-14 MED ORDER — SODIUM CHLORIDE 0.9 % IV SOLN: INTRAVENOUS | Status: DC

## 2015-04-14 MED ORDER — ONDANSETRON HCL 4 MG/2ML IJ SOLN
4.0000 mg | Freq: Once | INTRAMUSCULAR | Status: AC
  Administered 2015-04-14: 4 mg via INTRAVENOUS
  Filled 2015-04-14: qty 2

## 2015-04-14 NOTE — ED Notes (Signed)
EMS called to residence at children's request for altered mental status for the past few days.  Ems reports that pt was seen at [facility] yesterday and diagnosed with progressive dementia.  Pt denies any complaints.

## 2015-04-14 NOTE — ED Notes (Signed)
Pt states that she was incontinent because no one answered her call light.  Pt never pushed call light.  Re-educated on call light use.  Full linen change.

## 2015-04-14 NOTE — ED Notes (Signed)
Pt states that she does not know why her children called ems but that her and her husband live alone and that she is no longer able to walk.  States that she feels that she is no longer wanted in her home because her husband cannot take care of her due to her inability to walk.

## 2015-04-14 NOTE — ED Provider Notes (Signed)
CSN: [id number]     Arrival date & time 04/14/15  1126 History  This chart was scribed for Brenda Mulders, MD by Tanda Rockers, ED Scribe. This patient was seen in room APA03/APA03 and the patient's care was started at 12:32 PM.  Chief Complaint  Patient presents with  . Altered Mental Status   LEVEL 5 CAVEAT for mild confusion  Patient is a 74 y.o. female presenting with altered mental status. The history is provided by the patient and a relative. No language interpreter was used.  Altered Mental Status Presenting symptoms: disorientation   Severity:  Unable to specify Duration:  4 weeks Timing:  Intermittent Progression:  Unchanged Chronicity:  New Associated symptoms: decreased appetite      HPI Comments: Brenda Mcdonald is a 74 y.o. female brought in by ambulance, who presents to the Emergency Department complaining of intermittent altered mental status x 3-4 weeks. Family states that pt has been "delusional" and has not been eating very much lately. Pt was seen at [facility] 1 day ago for these symptoms and had lab work and CXR done with no acute findings. Pt was diagnosed with progressive dementia. Family would like CT scans to make sure that pt does not have any type of cancer. Family states that pt had a fever 1 day ago. Her temperature in the ED is 98.6. Family denies nausea, vomiting, diarrhea, or any other associated symptoms.   Per [facility] lab results: UA - nml CBC - WBC 10.7 Electrolytes - nml including LFTs Troponin - negative CXR, Abdomen, Pelvis, and L Spine -  Do not have these results  Past Medical History  Diagnosis Date  . COPD (chronic obstructive pulmonary disease)   . Bipolar disorder   . TIA (transient ischemic attack)   . Dysphagia   . Essential hypertension, benign   . Coronary atherosclerosis of native coronary artery     Nonobstructive  . Pulmonary nodule July 2009    Nonspecific 5 mm right middle lobe  . Anxiety   . Depression   .  Pneumonia   . Diabetes mellitus, type 2   . GERD (gastroesophageal reflux disease)   . Arthritis    Past Surgical History  Procedure Laterality Date  . Appendectomy    . Cholecystectomy    . Vesicovaginal fistula closure w/ tah    . Total knee arthroplasty Left 2000  . Knee arthroscopy Left   . Orif distal femur fracture Left 01/24/2014  . Abdominal hysterectomy    . Dilation and curettage of uterus    . Cardiac catheterization  11/2010  . Tibia fracture surgery Left ~ 1959  . Cataract extraction, bilateral Bilateral   . Orif femur fracture Left 01/24/2014    Procedure: OPEN REDUCTION INTERNAL FIXATION (ORIF) DISTAL FEMUR FRACTURE;  Surgeon: [name], MD;  Location: [facility] OR;  Service: Orthopedics;  Laterality: Left;  . Incision and drainage Left 03/12/2014    Procedure: LEFT INCISION/DRAINAGE DEEP ABSCESS/BURSA/HEMATOMA THIGH/KNEE REGION;  Surgeon: [name], MD;  Location: [facility] OR;  Service: Orthopedics;  Laterality: Left;   Family History  Problem Relation Age of Onset  . Coronary artery disease     Social History  Substance Use Topics  . Smoking status: Former Smoker -- 0.50 packs/day for 58 years    Types: Cigarettes    Quit date: 07/26/2010  . Smokeless tobacco: Never Used  . Alcohol Use: No   OB History    No data available  Review of Systems  Unable to perform ROS: Other  Constitutional: Positive for decreased appetite.   Allergies  Azithromycin; Penicillins; and Tetracycline  Home Medications   Prior to Admission medications   Medication Sig Start Date End Date Taking? Authorizing Provider  albuterol (PROVENTIL HFA;VENTOLIN HFA) 108 (90 BASE) MCG/ACT inhaler Inhale 2 puffs into the lungs every 6 (six) hours as needed for wheezing or shortness of breath.   Yes Historical Provider, MD  ALPRAZolam (XANAX) 0.25 MG tablet Take 1-2 tablets (0.25-0.5 mg total) by mouth 2 (two) times daily. Patient takes 1 tablet in the morning and 2 tablets at bedtime  03/15/14  Yes Esperanza Sheets, MD  amLODipine (NORVASC) 10 MG tablet Take 10 mg by mouth daily.   Yes Historical Provider, MD  atorvastatin (LIPITOR) 10 MG tablet TAKE (1) TABLET DAILY AT BEDTIME. 01/15/15  Yes Jonelle Sidle, MD  glipiZIDE (GLUCOTROL XL) 2.5 MG 24 hr tablet Take 1/2 tab by mouth daily   Yes Historical Provider, MD  HYDROcodone-acetaminophen (NORCO/VICODIN) 5-325 MG per tablet Take 1 tablet by mouth every 4 (four) hours as needed for moderate pain. 03/15/14  Yes Esperanza Sheets, MD  ipratropium-albuterol (DUONEB) 0.5-2.5 (3) MG/3ML SOLN Take 3 mLs by nebulization 4 (four) times daily as needed. Shortness of breath   Yes Historical Provider, MD  lamoTRIgine (LAMICTAL) 200 MG tablet Take 200 mg by mouth daily.     Yes Historical Provider, MD  lisinopril (PRINIVIL,ZESTRIL) 20 MG tablet Take 20 mg by mouth daily.     Yes Historical Provider, MD  metoprolol tartrate (LOPRESSOR) 25 MG tablet Take 0.5 tablets (12.5 mg total) by mouth 2 (two) times daily. 01/16/15  Yes Jonelle Sidle, MD  omeprazole (PRILOSEC) 40 MG capsule Take 40 mg by mouth daily.  10/03/12  Yes Historical Provider, MD  QUEtiapine (SEROQUEL) 25 MG tablet Take 25 mg by mouth at bedtime.     Yes Historical Provider, MD  sertraline (ZOLOFT) 100 MG tablet Take 100 mg by mouth 2 (two) times daily.    Yes Historical Provider, MD  solifenacin (VESICARE) 10 MG tablet Take 10 mg by mouth daily.     Yes Historical Provider, MD   Triage Vitals: BP 127/90 mmHg  Pulse 63  Temp(Src) 98.6 F (37 C) (Oral)  Resp 12  Ht 5\' 5"  (1.651 m)  Wt 200 lb (90.719 kg)  BMI 33.28 kg/m2  SpO2 97%   Physical Exam  Constitutional: She is oriented to person, place, and time. She appears well-developed and well-nourished. No distress.  HENT:  Head: Normocephalic and atraumatic.  Mouth/Throat: Mucous membranes are dry.  Eyes: Conjunctivae and EOM are normal. Pupils are equal, round, and reactive to light.  Sclera clear  Neck: Neck  supple. No tracheal deviation present.  Cardiovascular: Normal rate and regular rhythm.   No murmur heard. Pulses:      Radial pulses are 2+ on the right side, and 2+ on the left side.  Pulmonary/Chest: Effort normal and breath sounds normal. No respiratory distress. She has no wheezes. She has no rales. She exhibits no tenderness.  O2 saturation 94% on RA  Abdominal: Soft. Bowel sounds are normal. There is no tenderness.  Musculoskeletal: Normal range of motion. She exhibits no edema.  Neurological: She is alert and oriented to person, place, and time.  Moving toes and fingers  Skin: Skin is warm and dry.  Psychiatric: She has a normal mood and affect. Her behavior is normal.  Nursing note and vitals  reviewed.   ED Course  Procedures (including critical care time)  DIAGNOSTIC STUDIES: Oxygen Saturation is 97% on RA, normal by my interpretation.    COORDINATION OF CARE: 12:50 PM-Discussed treatment plan with pt at bedside and pt agreed to plan.    Labs Review Labs Reviewed  BASIC METABOLIC PANEL - Abnormal; Notable for the following:    Creatinine, Ser 1.29 (*)    Calcium 8.5 (*)    GFR calc non Af Amer 39 (*)    GFR calc Af Amer 46 (*)    All other components within normal limits  CBC WITH DIFFERENTIAL/PLATELET - Abnormal; Notable for the following:    Hemoglobin 11.3 (*)    HCT 35.4 (*)    All other components within normal limits   Results for orders placed or performed during the hospital encounter of 04/14/15  Basic metabolic panel  Result Value Ref Range   Sodium 138 135 - 145 mmol/L   Potassium 3.6 3.5 - 5.1 mmol/L   Chloride 104 101 - 111 mmol/L   CO2 27 22 - 32 mmol/L   Glucose, Bld 95 65 - 99 mg/dL   BUN 16 6 - 20 mg/dL   Creatinine, Ser 8.56 (H) 0.44 - 1.00 mg/dL   Calcium 8.5 (L) 8.9 - 10.3 mg/dL   GFR calc non Af Amer 39 (L) >60 mL/min   GFR calc Af Amer 46 (L) >60 mL/min   Anion gap 7 5 - 15  CBC with Differential/Platelet  Result Value Ref Range    WBC 10.3 4.0 - 10.5 K/uL   RBC 4.22 3.87 - 5.11 MIL/uL   Hemoglobin 11.3 (L) 12.0 - 15.0 g/dL   HCT 31.4 (L) 97.0 - 26.3 %   MCV 83.9 78.0 - 100.0 fL   MCH 26.8 26.0 - 34.0 pg   MCHC 31.9 30.0 - 36.0 g/dL   RDW 78.5 88.5 - 02.7 %   Platelets 198 150 - 400 K/uL   Neutrophils Relative % 68 %   Neutro Abs 7.0 1.7 - 7.7 K/uL   Lymphocytes Relative 22 %   Lymphs Abs 2.3 0.7 - 4.0 K/uL   Monocytes Relative 8 %   Monocytes Absolute 0.8 0.1 - 1.0 K/uL   Eosinophils Relative 2 %   Eosinophils Absolute 0.2 0.0 - 0.7 K/uL   Basophils Relative 0 %   Basophils Absolute 0.0 0.0 - 0.1 K/uL    Imaging Review Ct Head Wo Contrast  04/14/2015   CLINICAL DATA:  Altered mental status past few days recently diagnosed with progressive dementia.  EXAM: CT HEAD WITHOUT CONTRAST  TECHNIQUE: Contiguous axial images were obtained from the base of the skull through the vertex without intravenous contrast.  COMPARISON:  01/18/2011  FINDINGS: Ventricles, cisterns and other CSF spaces are within normal. There is chronic ischemic microvascular disease. There is no mass, mass effect, shift of midline structures or acute hemorrhage. No evidence of acute infarction. Remaining bones and soft tissues are within normal.  IMPRESSION: No acute intracranial findings.  Chronic ischemic microvascular disease.   Electronically Signed   By: Elberta Fortis M.D.   On: 04/14/2015 14:12   Mr Brain Wo Contrast  04/14/2015   CLINICAL DATA:  Altered mental status, weakness, unable to walk.  EXAM: MRI HEAD WITHOUT CONTRAST  TECHNIQUE: Multiplanar, multiecho pulse sequences of the brain and surrounding structures were obtained without intravenous contrast.  COMPARISON:  Head CT 04/14/2015.  Brain MRI 11/10/2007.  FINDINGS: Study is mildly to moderately motion degraded  despite utilizing faster, more motion resistant protocols.  There is no evidence of acute infarct, intracranial hemorrhage, mass, midline shift, or extra-axial fluid collection.  Patchy T2 hyperintensities in the deep cerebral white matter bilaterally have slightly progressed from the prior MRI and are nonspecific but compatible with moderate chronic small vessel ischemic disease. There is mild generalized cerebral atrophy.  Prior bilateral cataract extraction is noted. There is a trace left mastoid effusion. Paranasal sinuses are grossly clear. Major intracranial vascular flow voids are preserved.  IMPRESSION: 1. Motion degraded examination without evidence of acute intracranial abnormality. 2. Moderate chronic small vessel ischemic disease.   Electronically Signed   By: Sebastian Ache M.D.   On: 04/14/2015 16:16   Dg Chest Port 1 View  04/14/2015   CLINICAL DATA:  74 year old with acute mental status changes superimposed upon chronic dementia.  EXAM: PORTABLE CHEST - 1 VIEW  COMPARISON:  04/13/2015 and earlier, including CT chest 06/20/2012.  FINDINGS: Cardiac silhouette mildly enlarged for AP portable technique, unchanged. Thoracic aorta tortuous and atherosclerotic, unchanged. Hilar and mediastinal contours otherwise unremarkable. Vertically oriented linear scar in the medial right lower lobe, unchanged. No acute cardiopulmonary disease otherwise.  IMPRESSION: No acute cardiopulmonary disease.   Electronically Signed   By: Hulan Saas M.D.   On: 04/14/2015 14:13   I have personally reviewed and evaluated these images and lab results as part of my medical decision-making.   EKG Interpretation   Date/Time:  Monday April 14 2015 11:30:53 EDT Ventricular Rate:  64 PR Interval:  175 QRS Duration: 123 QT Interval:  456 QTC Calculation: 470 R Axis:   76 Text Interpretation:  Sinus rhythm Nonspecific intraventricular conduction  delay Baseline wander in lead(s) I III aVL No significant change since  last tracing Confirmed by KNAPP  MD-J, JON ([id number]) on 04/14/2015 12:02:05  PM      MDM   Final diagnoses:  Dementia, without behavioral disturbance    Patient with  extensive workup for the altered mental status without any significant findings. To include MRI of the brain. No significant lab abnormalities. Patient has follow-up with her primary care doctor in the morning. Referral made to neurology. No indications for admission.    I personally performed the services described in this documentation, which was scribed in my presence. The recorded information has been reviewed and is accurate.       Brenda Mulders, MD 04/14/15 [phone]

## 2015-04-14 NOTE — Discharge Instructions (Signed)
Workup here today without significant findings. To include MRI of the brain. Symptoms may be related to early dementia. Follow-up with your primary care doctor as scheduled for tomorrow.

## 2015-04-28 ENCOUNTER — Other Ambulatory Visit: Payer: Self-pay | Admitting: Cardiology

## 2015-06-12 IMAGING — CR DG FEMUR 2V*L*
5 series · 5 of 5 positions shown · non-contrast
Comparison: None

CLINICAL DATA: Fracture of the distal femur.

EXAM:
LEFT FEMUR - 2 VIEW

[view not recorded (1 of 5)]
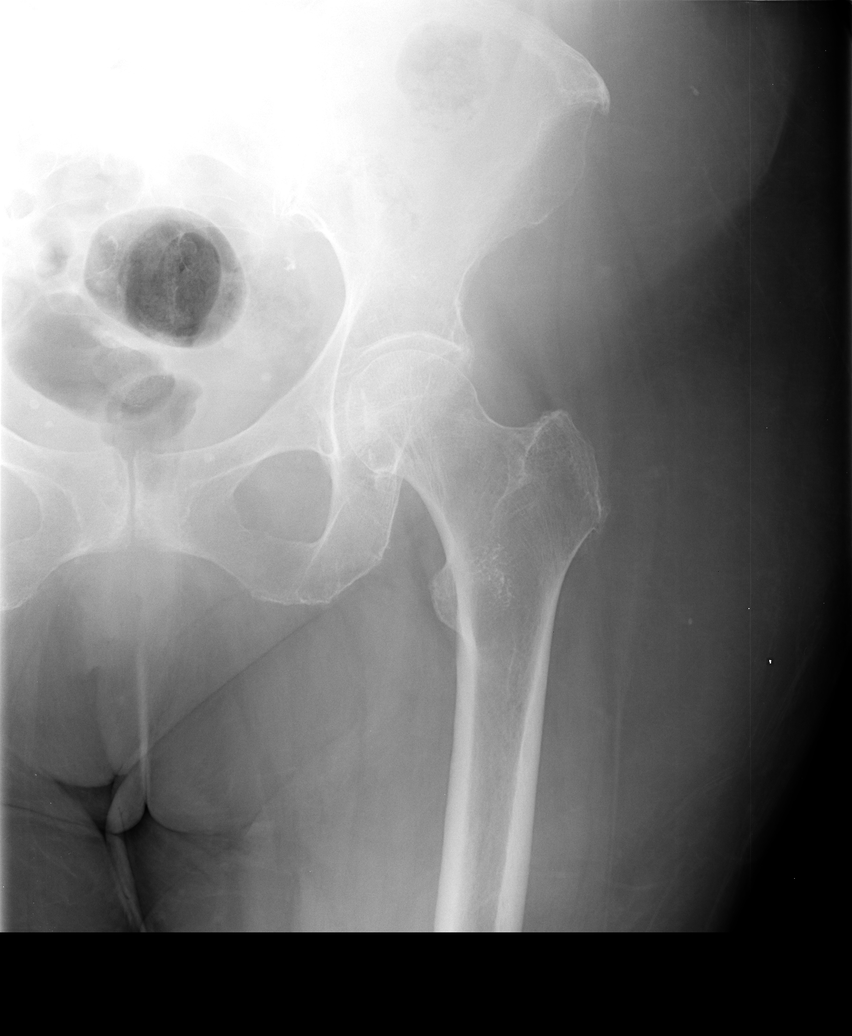

[view not recorded (2 of 5)]
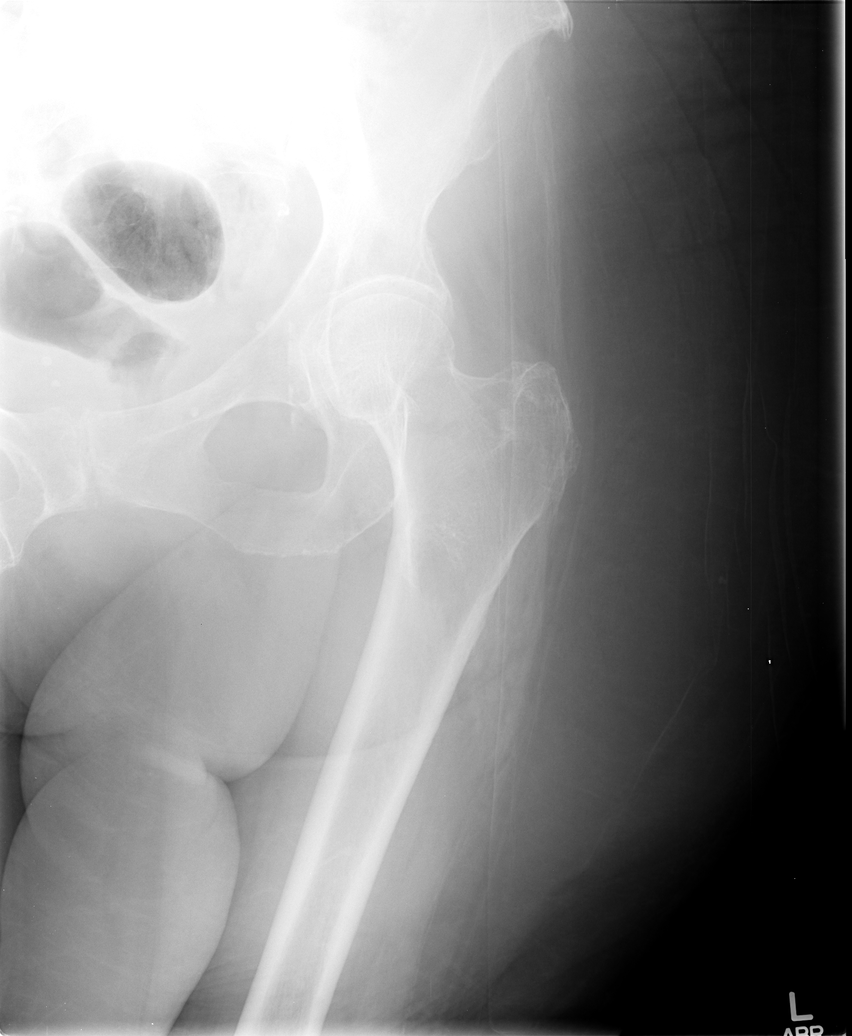

[view not recorded (3 of 5)]
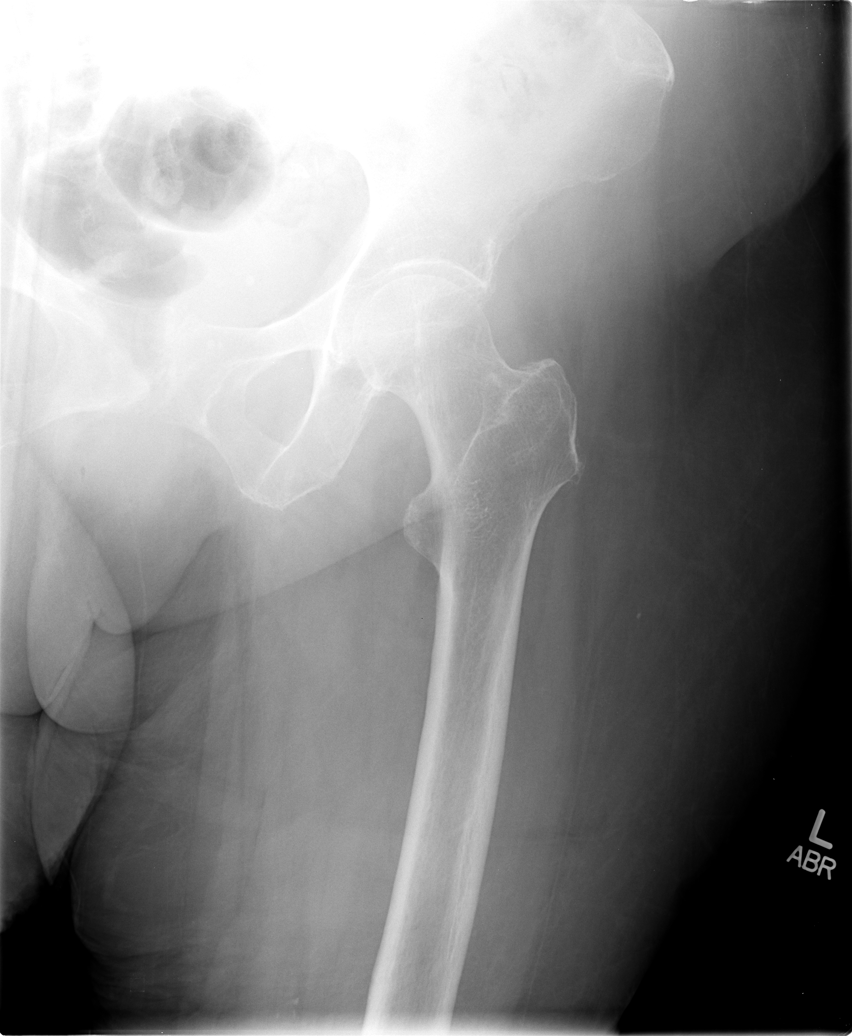

[view not recorded (4 of 5)]
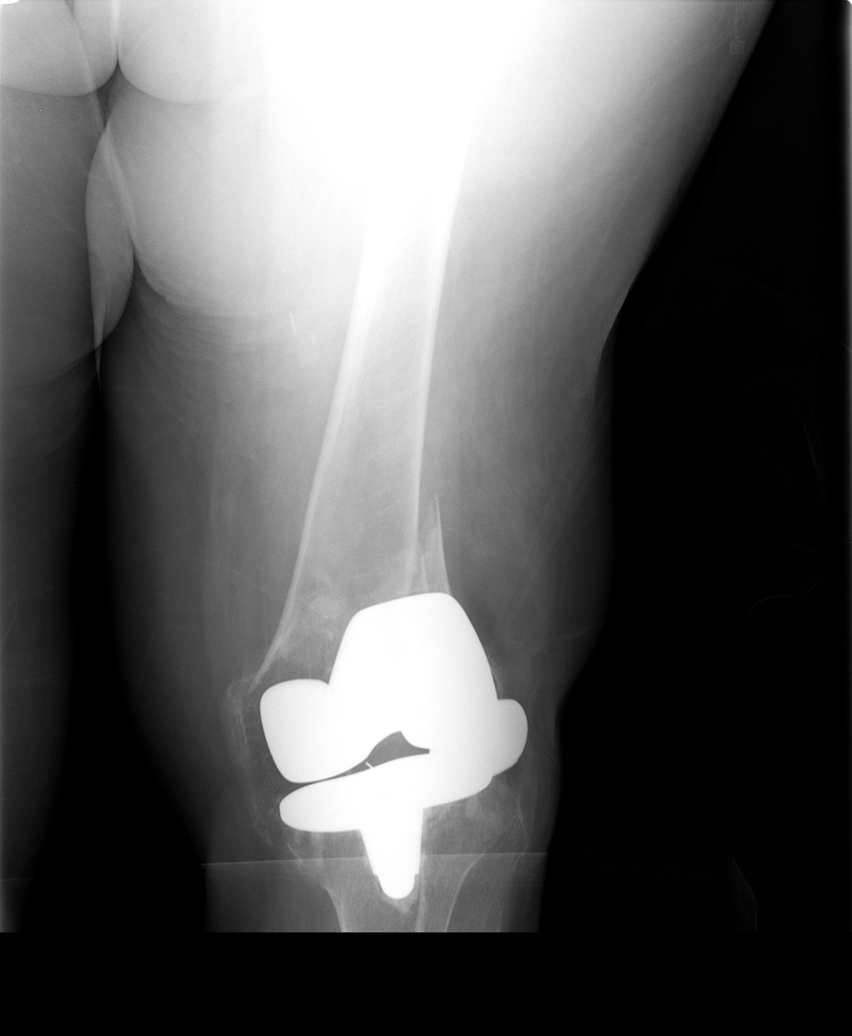

[view not recorded (5 of 5)]
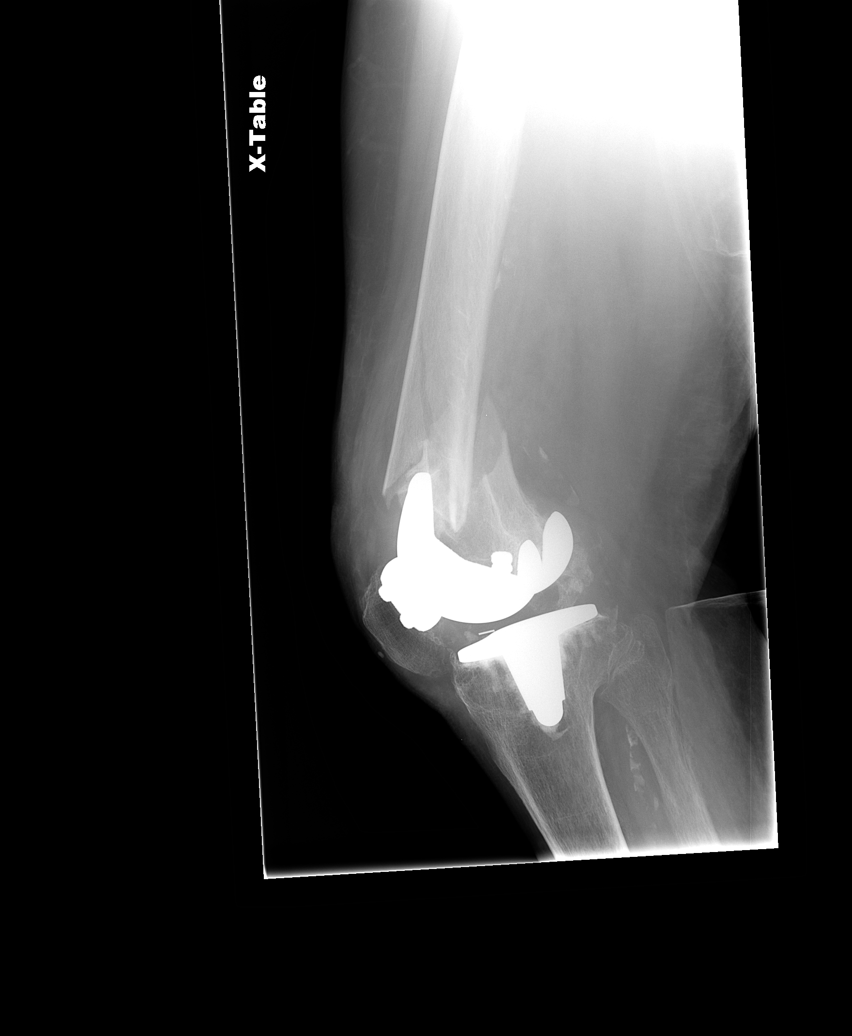

[5 of 5 positions shown; findings below may reference images not displayed]

FINDINGS: The patient is status post left total knee arthroplasty. There is a
comminuted distal metaphysis heel fracture involving the left femur.
There is posterior displacement of the distal fracture fragments by
[DATE] shaft's width. There is also mild lateral angulation of the
distal fracture fragments.
IMPRESSION: 1. Comminuted fracture involves the distal femur.

## 2015-06-13 IMAGING — RF DG C-ARM 61-120 MIN
1 series · 4 of 4 positions shown · non-contrast
Comparison: January 23, 2014.

CLINICAL DATA: Left femur fracture.

EXAM:
DG C-ARM 61-120 MIN; LEFT FEMUR - 2 VIEW

[Series 1: run · 4 of 4 slices shown]
[im 1/4]
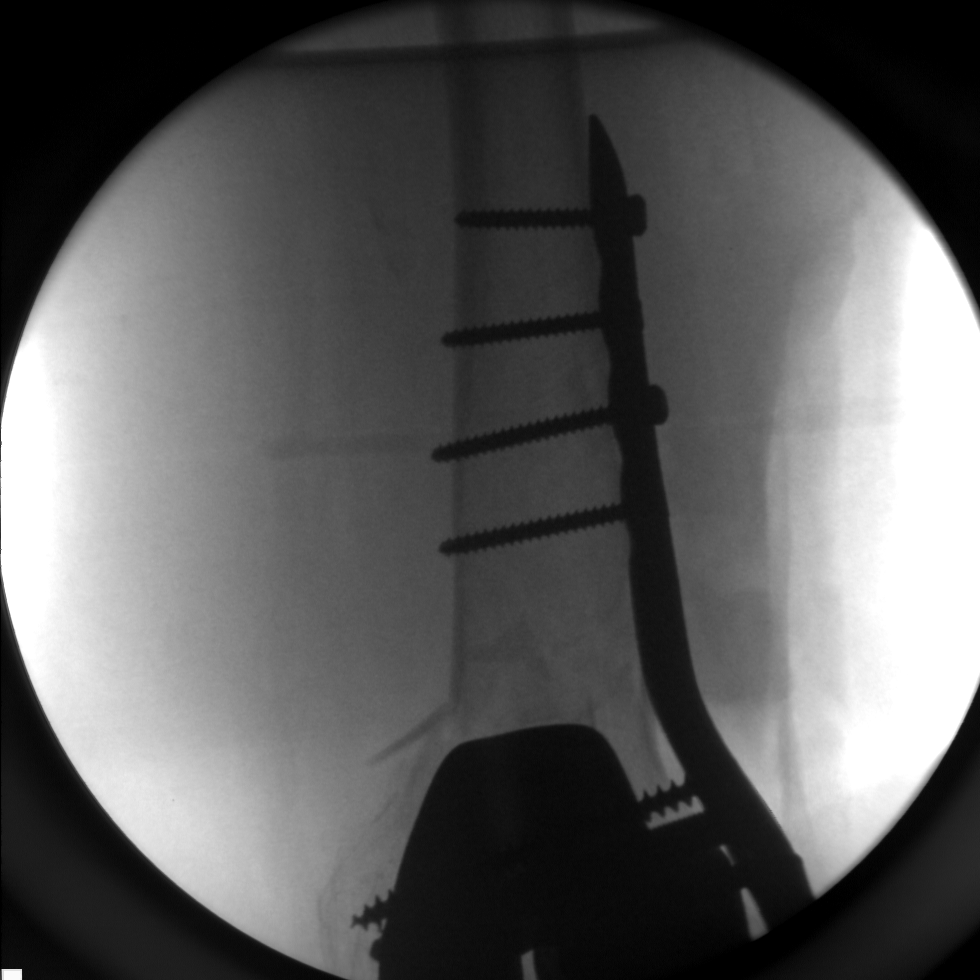
[im 2/4]
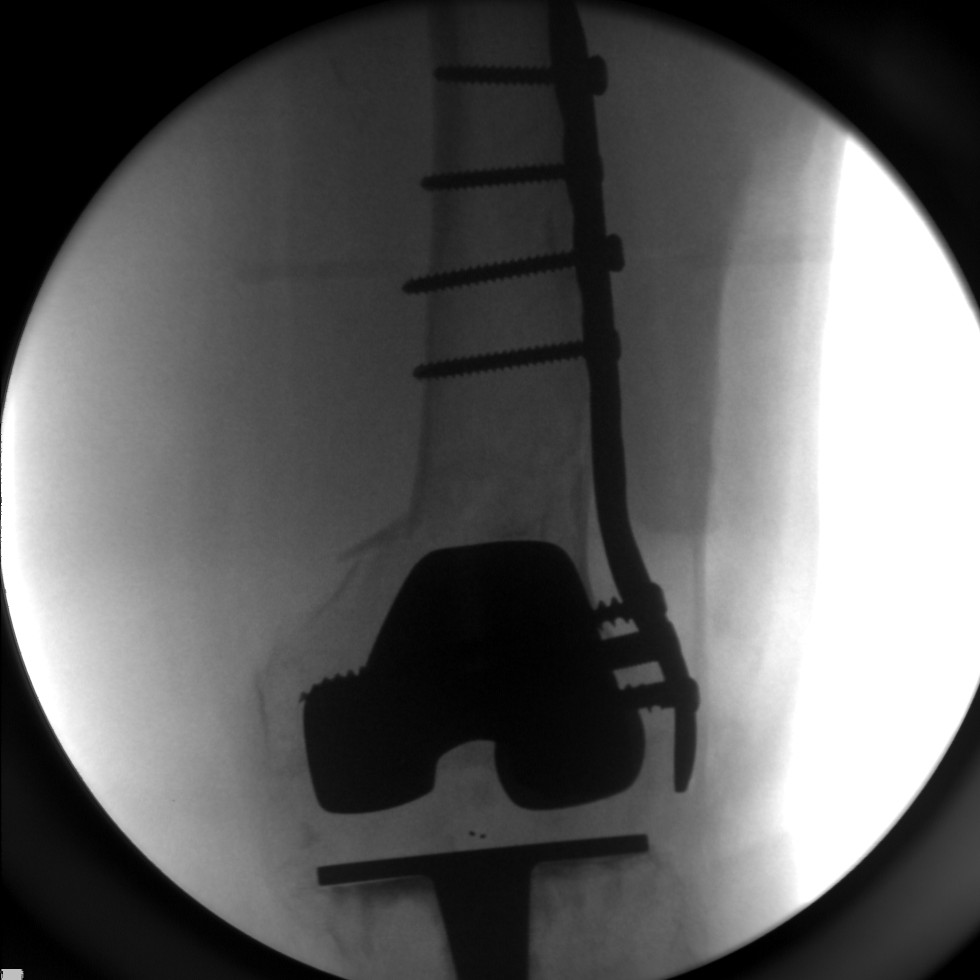
[im 3/4]
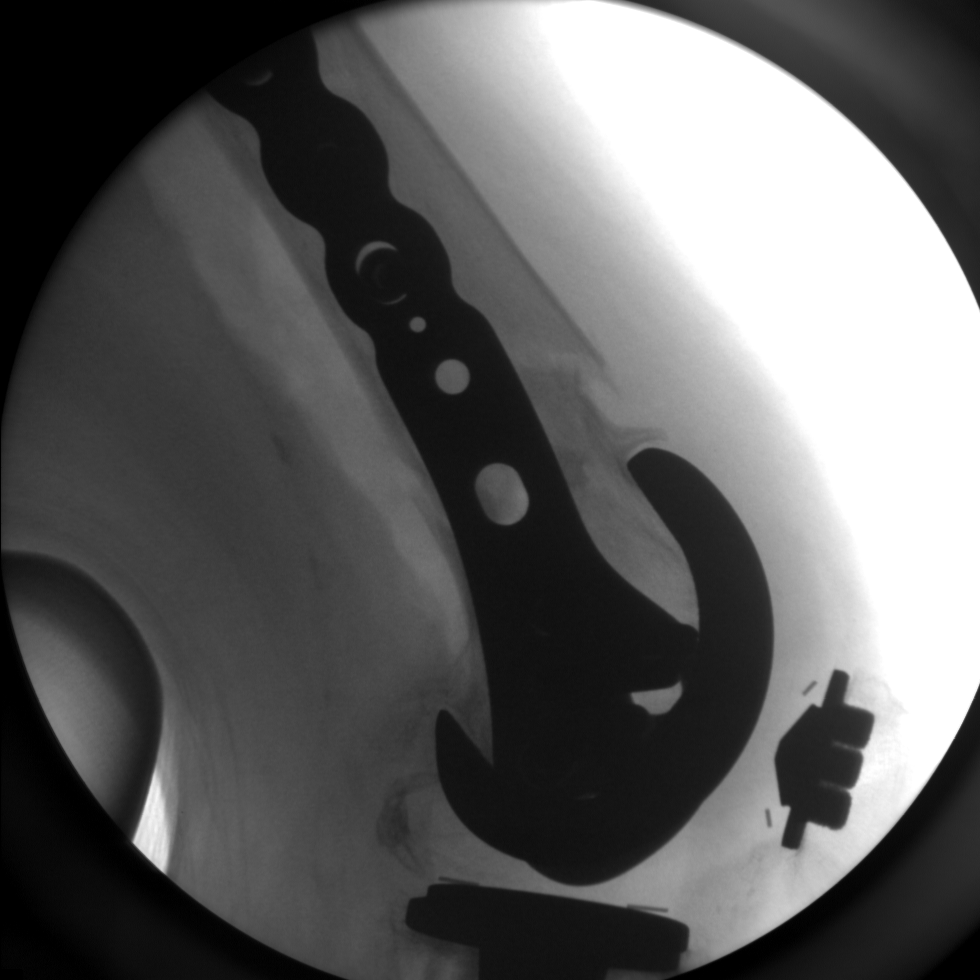
[im 4/4]
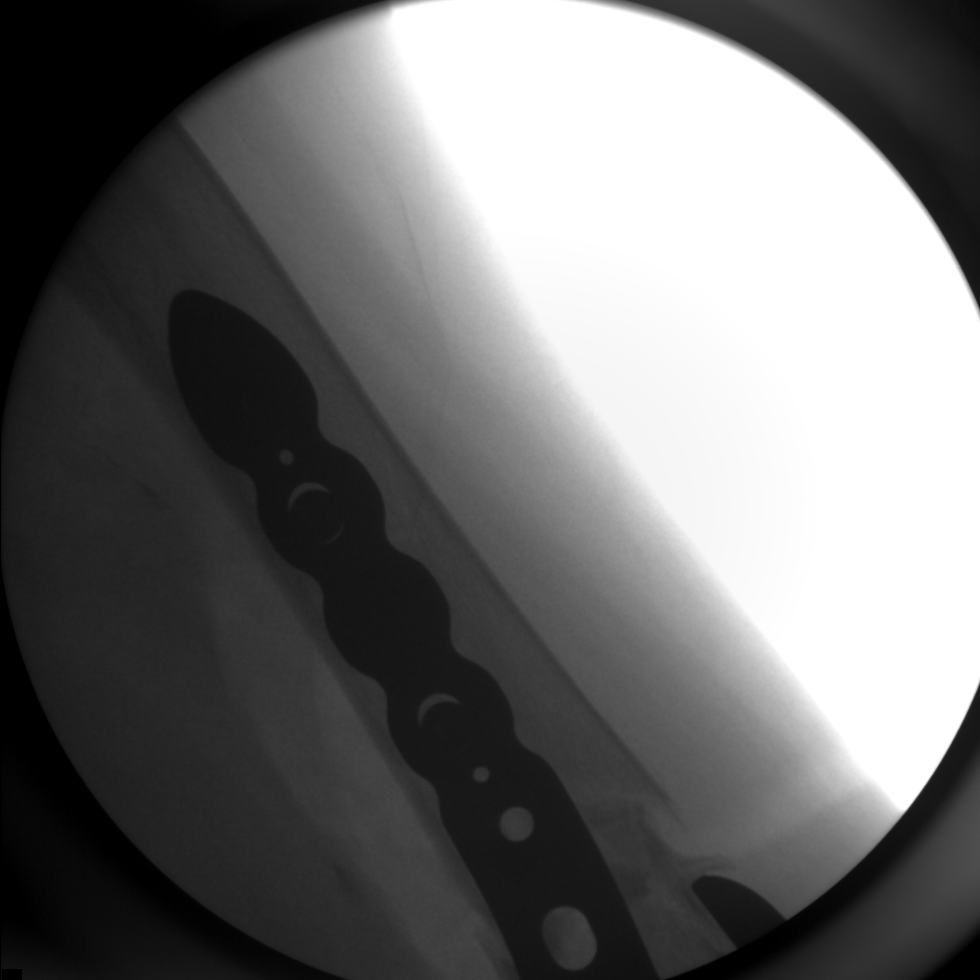

[4 of 4 positions shown; findings below may reference images not displayed]

FINDINGS: Four intraoperative fluoroscopic images of the distal left femur
were submitted for review. Status post left total knee arthroplasty.
Patient has undergone internal fixation of comminuted distal left
femoral fracture with significantly improved alignment of fracture
components compared to prior exam.
IMPRESSION: Status post internal fixation of comminuted distal left femoral
fracture.

## 2015-06-14 IMAGING — CR DG CHEST 1V PORT
1 series · 1 of 1 positions shown · non-contrast
Comparison: 01/23/2014

CLINICAL DATA: Fever and weakness

EXAM:
PORTABLE CHEST - 1 VIEW

[AP]
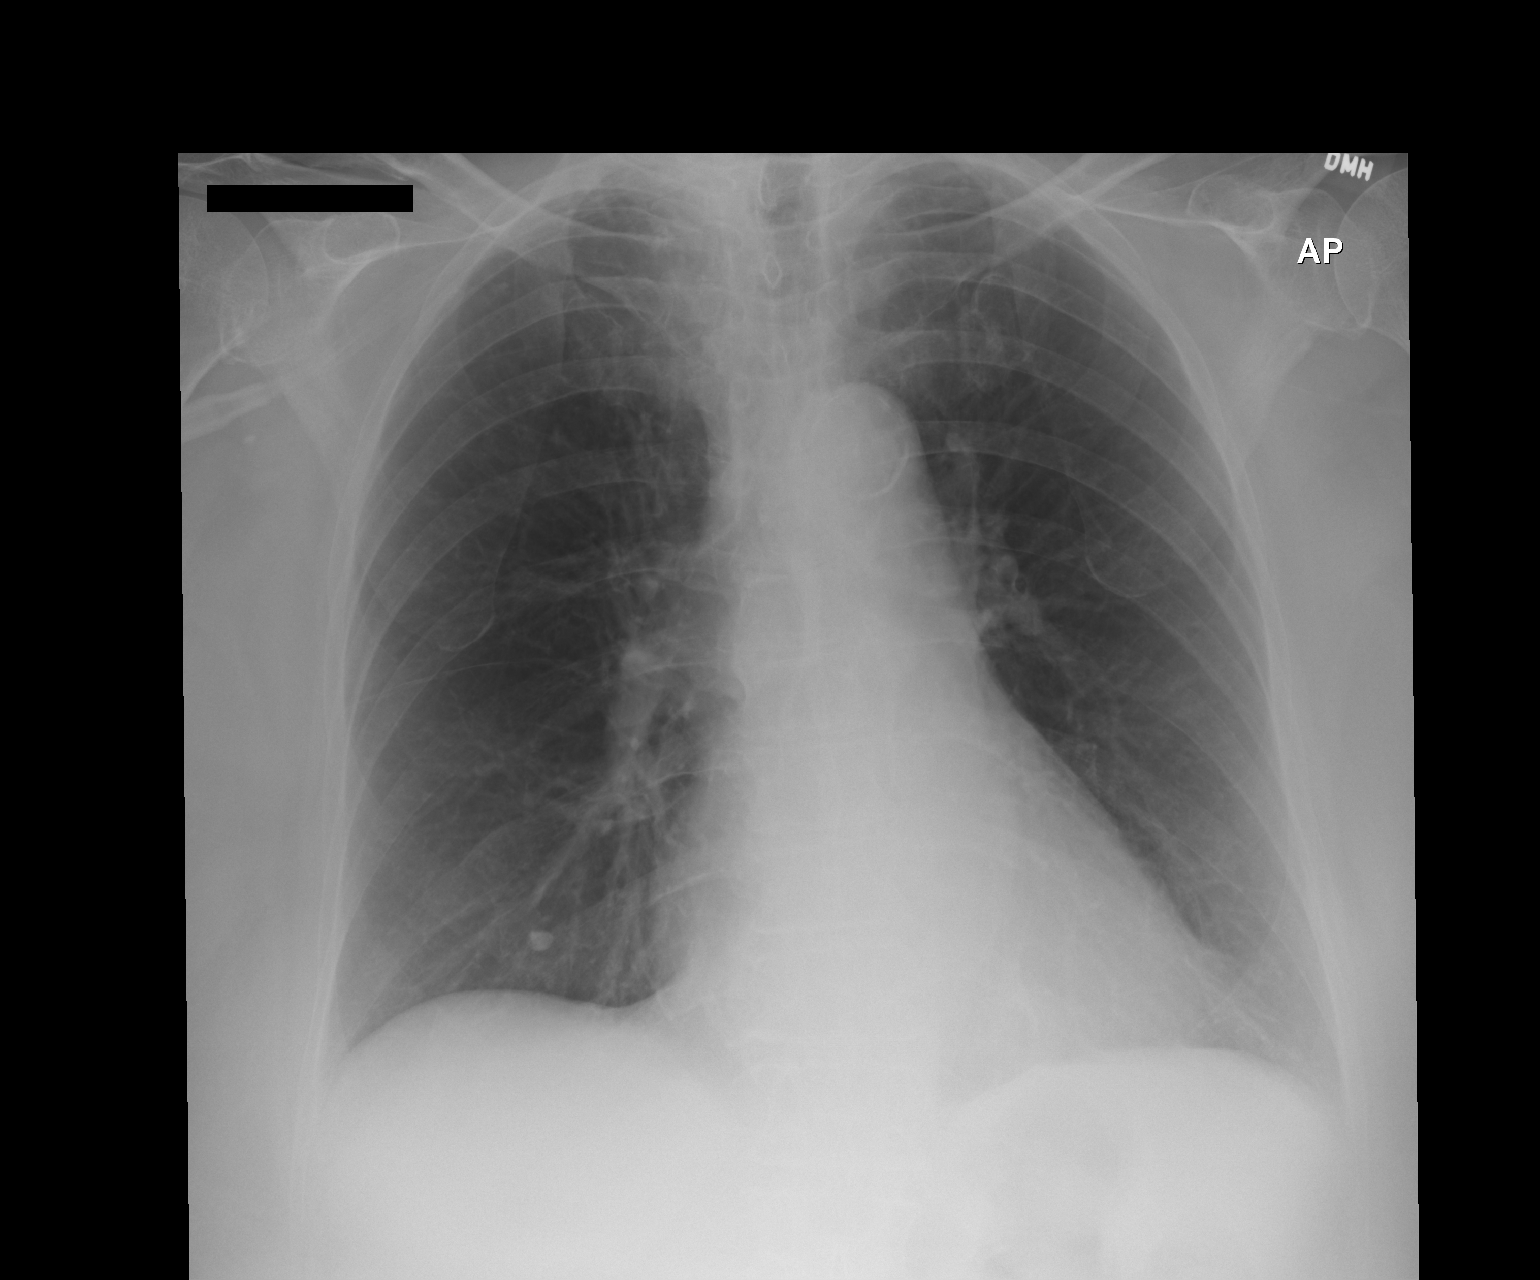

[1 of 1 positions shown; findings below may reference images not displayed]

FINDINGS: Cardiac shadow is stable. A calcified granuloma is again noted in
the right base. No focal infiltrate or sizable effusion is seen. No
bony abnormality is noted.
IMPRESSION: No acute abnormality seen.

These results were called by telephone at the time of interpretation
on 01/25/2014 at [DATE] to Delt[place], the patients nurse, who verbally
acknowledged these results.

## 2015-06-16 ENCOUNTER — Other Ambulatory Visit: Payer: Self-pay | Admitting: Cardiology

## 2015-08-25 DIAGNOSIS — F331 Major depressive disorder, recurrent, moderate: Secondary | ICD-10-CM | POA: Diagnosis not present

## 2015-08-28 DIAGNOSIS — M170 Bilateral primary osteoarthritis of knee: Secondary | ICD-10-CM | POA: Diagnosis not present

## 2015-09-30 DIAGNOSIS — F319 Bipolar disorder, unspecified: Secondary | ICD-10-CM | POA: Diagnosis not present

## 2015-09-30 DIAGNOSIS — Z299 Encounter for prophylactic measures, unspecified: Secondary | ICD-10-CM | POA: Diagnosis not present

## 2015-09-30 DIAGNOSIS — E1140 Type 2 diabetes mellitus with diabetic neuropathy, unspecified: Secondary | ICD-10-CM | POA: Diagnosis not present

## 2015-09-30 DIAGNOSIS — J449 Chronic obstructive pulmonary disease, unspecified: Secondary | ICD-10-CM | POA: Diagnosis not present

## 2015-10-10 DIAGNOSIS — E119 Type 2 diabetes mellitus without complications: Secondary | ICD-10-CM | POA: Diagnosis not present

## 2015-10-10 DIAGNOSIS — I10 Essential (primary) hypertension: Secondary | ICD-10-CM | POA: Diagnosis not present

## 2015-10-21 DIAGNOSIS — H524 Presbyopia: Secondary | ICD-10-CM | POA: Diagnosis not present

## 2015-10-21 DIAGNOSIS — E119 Type 2 diabetes mellitus without complications: Secondary | ICD-10-CM | POA: Diagnosis not present

## 2015-11-03 DIAGNOSIS — H6123 Impacted cerumen, bilateral: Secondary | ICD-10-CM | POA: Diagnosis not present

## 2015-11-03 DIAGNOSIS — H906 Mixed conductive and sensorineural hearing loss, bilateral: Secondary | ICD-10-CM | POA: Diagnosis not present

## 2015-11-04 DIAGNOSIS — I10 Essential (primary) hypertension: Secondary | ICD-10-CM | POA: Diagnosis not present

## 2015-11-04 DIAGNOSIS — E119 Type 2 diabetes mellitus without complications: Secondary | ICD-10-CM | POA: Diagnosis not present

## 2015-11-07 ENCOUNTER — Other Ambulatory Visit: Payer: Self-pay | Admitting: Cardiology

## 2015-11-26 DIAGNOSIS — E119 Type 2 diabetes mellitus without complications: Secondary | ICD-10-CM | POA: Diagnosis not present

## 2015-11-26 DIAGNOSIS — F329 Major depressive disorder, single episode, unspecified: Secondary | ICD-10-CM | POA: Diagnosis not present

## 2015-11-26 DIAGNOSIS — I10 Essential (primary) hypertension: Secondary | ICD-10-CM | POA: Diagnosis not present

## 2015-12-02 DIAGNOSIS — I10 Essential (primary) hypertension: Secondary | ICD-10-CM | POA: Diagnosis not present

## 2015-12-02 DIAGNOSIS — E1140 Type 2 diabetes mellitus with diabetic neuropathy, unspecified: Secondary | ICD-10-CM | POA: Diagnosis not present

## 2015-12-02 DIAGNOSIS — L03119 Cellulitis of unspecified part of limb: Secondary | ICD-10-CM | POA: Diagnosis not present

## 2015-12-02 DIAGNOSIS — Z87891 Personal history of nicotine dependence: Secondary | ICD-10-CM | POA: Diagnosis not present

## 2015-12-02 DIAGNOSIS — M545 Low back pain: Secondary | ICD-10-CM | POA: Diagnosis not present

## 2015-12-02 DIAGNOSIS — F0390 Unspecified dementia without behavioral disturbance: Secondary | ICD-10-CM | POA: Diagnosis not present

## 2015-12-09 DIAGNOSIS — J449 Chronic obstructive pulmonary disease, unspecified: Secondary | ICD-10-CM | POA: Diagnosis not present

## 2015-12-09 DIAGNOSIS — R0602 Shortness of breath: Secondary | ICD-10-CM | POA: Diagnosis not present

## 2015-12-09 DIAGNOSIS — Z87891 Personal history of nicotine dependence: Secondary | ICD-10-CM | POA: Diagnosis not present

## 2015-12-09 DIAGNOSIS — Z79899 Other long term (current) drug therapy: Secondary | ICD-10-CM | POA: Diagnosis not present

## 2015-12-09 DIAGNOSIS — R5383 Other fatigue: Secondary | ICD-10-CM | POA: Diagnosis not present

## 2015-12-09 DIAGNOSIS — N183 Chronic kidney disease, stage 3 (moderate): Secondary | ICD-10-CM | POA: Diagnosis not present

## 2015-12-30 DIAGNOSIS — E119 Type 2 diabetes mellitus without complications: Secondary | ICD-10-CM | POA: Diagnosis not present

## 2015-12-30 DIAGNOSIS — I10 Essential (primary) hypertension: Secondary | ICD-10-CM | POA: Diagnosis not present

## 2015-12-31 DIAGNOSIS — R5383 Other fatigue: Secondary | ICD-10-CM | POA: Diagnosis not present

## 2015-12-31 DIAGNOSIS — Z79899 Other long term (current) drug therapy: Secondary | ICD-10-CM | POA: Diagnosis not present

## 2016-01-15 DIAGNOSIS — R229 Localized swelling, mass and lump, unspecified: Secondary | ICD-10-CM | POA: Diagnosis not present

## 2016-01-15 DIAGNOSIS — T7840XA Allergy, unspecified, initial encounter: Secondary | ICD-10-CM | POA: Diagnosis not present

## 2016-01-15 DIAGNOSIS — T380X5A Adverse effect of glucocorticoids and synthetic analogues, initial encounter: Secondary | ICD-10-CM | POA: Diagnosis not present

## 2016-01-15 DIAGNOSIS — T783XXA Angioneurotic edema, initial encounter: Secondary | ICD-10-CM | POA: Diagnosis not present

## 2016-01-15 DIAGNOSIS — E873 Alkalosis: Secondary | ICD-10-CM | POA: Diagnosis not present

## 2016-01-15 DIAGNOSIS — R0682 Tachypnea, not elsewhere classified: Secondary | ICD-10-CM | POA: Diagnosis not present

## 2016-01-15 DIAGNOSIS — R251 Tremor, unspecified: Secondary | ICD-10-CM | POA: Diagnosis not present

## 2016-01-15 DIAGNOSIS — R410 Disorientation, unspecified: Secondary | ICD-10-CM | POA: Diagnosis not present

## 2016-01-15 DIAGNOSIS — T464X5A Adverse effect of angiotensin-converting-enzyme inhibitors, initial encounter: Secondary | ICD-10-CM | POA: Diagnosis not present

## 2016-01-15 DIAGNOSIS — F09 Unspecified mental disorder due to known physiological condition: Secondary | ICD-10-CM | POA: Diagnosis not present

## 2016-01-16 DIAGNOSIS — Z79899 Other long term (current) drug therapy: Secondary | ICD-10-CM | POA: Diagnosis not present

## 2016-01-16 DIAGNOSIS — R410 Disorientation, unspecified: Secondary | ICD-10-CM | POA: Diagnosis present

## 2016-01-16 DIAGNOSIS — Z833 Family history of diabetes mellitus: Secondary | ICD-10-CM | POA: Diagnosis not present

## 2016-01-16 DIAGNOSIS — Z8489 Family history of other specified conditions: Secondary | ICD-10-CM | POA: Diagnosis not present

## 2016-01-16 DIAGNOSIS — R0602 Shortness of breath: Secondary | ICD-10-CM | POA: Diagnosis not present

## 2016-01-16 DIAGNOSIS — T380X5A Adverse effect of glucocorticoids and synthetic analogues, initial encounter: Secondary | ICD-10-CM | POA: Diagnosis present

## 2016-01-16 DIAGNOSIS — T464X5A Adverse effect of angiotensin-converting-enzyme inhibitors, initial encounter: Secondary | ICD-10-CM | POA: Diagnosis not present

## 2016-01-16 DIAGNOSIS — I10 Essential (primary) hypertension: Secondary | ICD-10-CM | POA: Diagnosis present

## 2016-01-16 DIAGNOSIS — E873 Alkalosis: Secondary | ICD-10-CM | POA: Diagnosis not present

## 2016-01-16 DIAGNOSIS — F09 Unspecified mental disorder due to known physiological condition: Secondary | ICD-10-CM | POA: Diagnosis not present

## 2016-01-16 DIAGNOSIS — Z8249 Family history of ischemic heart disease and other diseases of the circulatory system: Secondary | ICD-10-CM | POA: Diagnosis not present

## 2016-01-16 DIAGNOSIS — Z880 Allergy status to penicillin: Secondary | ICD-10-CM | POA: Diagnosis not present

## 2016-01-16 DIAGNOSIS — Z7982 Long term (current) use of aspirin: Secondary | ICD-10-CM | POA: Diagnosis not present

## 2016-01-16 DIAGNOSIS — F419 Anxiety disorder, unspecified: Secondary | ICD-10-CM | POA: Diagnosis present

## 2016-01-16 DIAGNOSIS — E109 Type 1 diabetes mellitus without complications: Secondary | ICD-10-CM | POA: Diagnosis present

## 2016-01-16 DIAGNOSIS — J449 Chronic obstructive pulmonary disease, unspecified: Secondary | ICD-10-CM | POA: Diagnosis present

## 2016-01-16 DIAGNOSIS — T783XXA Angioneurotic edema, initial encounter: Secondary | ICD-10-CM | POA: Diagnosis not present

## 2016-01-16 DIAGNOSIS — E860 Dehydration: Secondary | ICD-10-CM | POA: Diagnosis present

## 2016-01-20 DIAGNOSIS — I5030 Unspecified diastolic (congestive) heart failure: Secondary | ICD-10-CM | POA: Diagnosis not present

## 2016-01-20 DIAGNOSIS — T464X5D Adverse effect of angiotensin-converting-enzyme inhibitors, subsequent encounter: Secondary | ICD-10-CM | POA: Diagnosis not present

## 2016-01-20 DIAGNOSIS — Z7984 Long term (current) use of oral hypoglycemic drugs: Secondary | ICD-10-CM | POA: Diagnosis not present

## 2016-01-20 DIAGNOSIS — I959 Hypotension, unspecified: Secondary | ICD-10-CM | POA: Diagnosis not present

## 2016-01-20 DIAGNOSIS — J441 Chronic obstructive pulmonary disease with (acute) exacerbation: Secondary | ICD-10-CM | POA: Diagnosis not present

## 2016-01-20 DIAGNOSIS — J440 Chronic obstructive pulmonary disease with acute lower respiratory infection: Secondary | ICD-10-CM | POA: Diagnosis not present

## 2016-01-20 DIAGNOSIS — Z5181 Encounter for therapeutic drug level monitoring: Secondary | ICD-10-CM | POA: Diagnosis not present

## 2016-01-20 DIAGNOSIS — J189 Pneumonia, unspecified organism: Secondary | ICD-10-CM | POA: Diagnosis not present

## 2016-01-20 DIAGNOSIS — E119 Type 2 diabetes mellitus without complications: Secondary | ICD-10-CM | POA: Diagnosis not present

## 2016-01-22 DIAGNOSIS — E119 Type 2 diabetes mellitus without complications: Secondary | ICD-10-CM | POA: Diagnosis not present

## 2016-01-22 DIAGNOSIS — J440 Chronic obstructive pulmonary disease with acute lower respiratory infection: Secondary | ICD-10-CM | POA: Diagnosis not present

## 2016-01-22 DIAGNOSIS — I5030 Unspecified diastolic (congestive) heart failure: Secondary | ICD-10-CM | POA: Diagnosis not present

## 2016-01-22 DIAGNOSIS — J189 Pneumonia, unspecified organism: Secondary | ICD-10-CM | POA: Diagnosis not present

## 2016-01-22 DIAGNOSIS — J441 Chronic obstructive pulmonary disease with (acute) exacerbation: Secondary | ICD-10-CM | POA: Diagnosis not present

## 2016-01-22 DIAGNOSIS — T464X5D Adverse effect of angiotensin-converting-enzyme inhibitors, subsequent encounter: Secondary | ICD-10-CM | POA: Diagnosis not present

## 2016-01-23 DIAGNOSIS — T464X5D Adverse effect of angiotensin-converting-enzyme inhibitors, subsequent encounter: Secondary | ICD-10-CM | POA: Diagnosis not present

## 2016-01-23 DIAGNOSIS — J441 Chronic obstructive pulmonary disease with (acute) exacerbation: Secondary | ICD-10-CM | POA: Diagnosis not present

## 2016-01-23 DIAGNOSIS — I5030 Unspecified diastolic (congestive) heart failure: Secondary | ICD-10-CM | POA: Diagnosis not present

## 2016-01-23 DIAGNOSIS — E119 Type 2 diabetes mellitus without complications: Secondary | ICD-10-CM | POA: Diagnosis not present

## 2016-01-23 DIAGNOSIS — J440 Chronic obstructive pulmonary disease with acute lower respiratory infection: Secondary | ICD-10-CM | POA: Diagnosis not present

## 2016-01-23 DIAGNOSIS — J189 Pneumonia, unspecified organism: Secondary | ICD-10-CM | POA: Diagnosis not present

## 2016-01-26 DIAGNOSIS — E042 Nontoxic multinodular goiter: Secondary | ICD-10-CM | POA: Diagnosis not present

## 2016-01-27 DIAGNOSIS — E119 Type 2 diabetes mellitus without complications: Secondary | ICD-10-CM | POA: Diagnosis not present

## 2016-01-27 DIAGNOSIS — J189 Pneumonia, unspecified organism: Secondary | ICD-10-CM | POA: Diagnosis not present

## 2016-01-27 DIAGNOSIS — T464X5D Adverse effect of angiotensin-converting-enzyme inhibitors, subsequent encounter: Secondary | ICD-10-CM | POA: Diagnosis not present

## 2016-01-27 DIAGNOSIS — J440 Chronic obstructive pulmonary disease with acute lower respiratory infection: Secondary | ICD-10-CM | POA: Diagnosis not present

## 2016-01-27 DIAGNOSIS — I5030 Unspecified diastolic (congestive) heart failure: Secondary | ICD-10-CM | POA: Diagnosis not present

## 2016-01-27 DIAGNOSIS — J441 Chronic obstructive pulmonary disease with (acute) exacerbation: Secondary | ICD-10-CM | POA: Diagnosis not present

## 2016-01-28 DIAGNOSIS — L03119 Cellulitis of unspecified part of limb: Secondary | ICD-10-CM | POA: Diagnosis not present

## 2016-01-28 DIAGNOSIS — R600 Localized edema: Secondary | ICD-10-CM | POA: Diagnosis not present

## 2016-01-28 DIAGNOSIS — F419 Anxiety disorder, unspecified: Secondary | ICD-10-CM | POA: Diagnosis not present

## 2016-01-28 DIAGNOSIS — Z09 Encounter for follow-up examination after completed treatment for conditions other than malignant neoplasm: Secondary | ICD-10-CM | POA: Diagnosis not present

## 2016-01-29 DIAGNOSIS — I5030 Unspecified diastolic (congestive) heart failure: Secondary | ICD-10-CM | POA: Diagnosis not present

## 2016-01-29 DIAGNOSIS — T464X5D Adverse effect of angiotensin-converting-enzyme inhibitors, subsequent encounter: Secondary | ICD-10-CM | POA: Diagnosis not present

## 2016-01-29 DIAGNOSIS — E119 Type 2 diabetes mellitus without complications: Secondary | ICD-10-CM | POA: Diagnosis not present

## 2016-01-29 DIAGNOSIS — J440 Chronic obstructive pulmonary disease with acute lower respiratory infection: Secondary | ICD-10-CM | POA: Diagnosis not present

## 2016-01-29 DIAGNOSIS — J441 Chronic obstructive pulmonary disease with (acute) exacerbation: Secondary | ICD-10-CM | POA: Diagnosis not present

## 2016-01-29 DIAGNOSIS — J189 Pneumonia, unspecified organism: Secondary | ICD-10-CM | POA: Diagnosis not present

## 2016-01-30 DIAGNOSIS — J441 Chronic obstructive pulmonary disease with (acute) exacerbation: Secondary | ICD-10-CM | POA: Diagnosis not present

## 2016-01-30 DIAGNOSIS — J440 Chronic obstructive pulmonary disease with acute lower respiratory infection: Secondary | ICD-10-CM | POA: Diagnosis not present

## 2016-01-30 DIAGNOSIS — E119 Type 2 diabetes mellitus without complications: Secondary | ICD-10-CM | POA: Diagnosis not present

## 2016-01-30 DIAGNOSIS — T464X5D Adverse effect of angiotensin-converting-enzyme inhibitors, subsequent encounter: Secondary | ICD-10-CM | POA: Diagnosis not present

## 2016-01-30 DIAGNOSIS — I5030 Unspecified diastolic (congestive) heart failure: Secondary | ICD-10-CM | POA: Diagnosis not present

## 2016-01-30 DIAGNOSIS — J189 Pneumonia, unspecified organism: Secondary | ICD-10-CM | POA: Diagnosis not present

## 2016-02-02 DIAGNOSIS — J189 Pneumonia, unspecified organism: Secondary | ICD-10-CM | POA: Diagnosis not present

## 2016-02-02 DIAGNOSIS — E119 Type 2 diabetes mellitus without complications: Secondary | ICD-10-CM | POA: Diagnosis not present

## 2016-02-02 DIAGNOSIS — T464X5D Adverse effect of angiotensin-converting-enzyme inhibitors, subsequent encounter: Secondary | ICD-10-CM | POA: Diagnosis not present

## 2016-02-02 DIAGNOSIS — I5030 Unspecified diastolic (congestive) heart failure: Secondary | ICD-10-CM | POA: Diagnosis not present

## 2016-02-02 DIAGNOSIS — J441 Chronic obstructive pulmonary disease with (acute) exacerbation: Secondary | ICD-10-CM | POA: Diagnosis not present

## 2016-02-02 DIAGNOSIS — J440 Chronic obstructive pulmonary disease with acute lower respiratory infection: Secondary | ICD-10-CM | POA: Diagnosis not present

## 2016-02-03 DIAGNOSIS — J441 Chronic obstructive pulmonary disease with (acute) exacerbation: Secondary | ICD-10-CM | POA: Diagnosis not present

## 2016-02-03 DIAGNOSIS — T464X5D Adverse effect of angiotensin-converting-enzyme inhibitors, subsequent encounter: Secondary | ICD-10-CM | POA: Diagnosis not present

## 2016-02-03 DIAGNOSIS — I5030 Unspecified diastolic (congestive) heart failure: Secondary | ICD-10-CM | POA: Diagnosis not present

## 2016-02-03 DIAGNOSIS — E119 Type 2 diabetes mellitus without complications: Secondary | ICD-10-CM | POA: Diagnosis not present

## 2016-02-03 DIAGNOSIS — J189 Pneumonia, unspecified organism: Secondary | ICD-10-CM | POA: Diagnosis not present

## 2016-02-03 DIAGNOSIS — J440 Chronic obstructive pulmonary disease with acute lower respiratory infection: Secondary | ICD-10-CM | POA: Diagnosis not present

## 2016-02-05 DIAGNOSIS — J441 Chronic obstructive pulmonary disease with (acute) exacerbation: Secondary | ICD-10-CM | POA: Diagnosis not present

## 2016-02-05 DIAGNOSIS — E119 Type 2 diabetes mellitus without complications: Secondary | ICD-10-CM | POA: Diagnosis not present

## 2016-02-05 DIAGNOSIS — I5030 Unspecified diastolic (congestive) heart failure: Secondary | ICD-10-CM | POA: Diagnosis not present

## 2016-02-05 DIAGNOSIS — T464X5D Adverse effect of angiotensin-converting-enzyme inhibitors, subsequent encounter: Secondary | ICD-10-CM | POA: Diagnosis not present

## 2016-02-05 DIAGNOSIS — J189 Pneumonia, unspecified organism: Secondary | ICD-10-CM | POA: Diagnosis not present

## 2016-02-05 DIAGNOSIS — J440 Chronic obstructive pulmonary disease with acute lower respiratory infection: Secondary | ICD-10-CM | POA: Diagnosis not present

## 2016-02-17 DIAGNOSIS — J189 Pneumonia, unspecified organism: Secondary | ICD-10-CM | POA: Diagnosis not present

## 2016-02-17 DIAGNOSIS — I5030 Unspecified diastolic (congestive) heart failure: Secondary | ICD-10-CM | POA: Diagnosis not present

## 2016-02-17 DIAGNOSIS — T464X5D Adverse effect of angiotensin-converting-enzyme inhibitors, subsequent encounter: Secondary | ICD-10-CM | POA: Diagnosis not present

## 2016-02-17 DIAGNOSIS — E119 Type 2 diabetes mellitus without complications: Secondary | ICD-10-CM | POA: Diagnosis not present

## 2016-02-17 DIAGNOSIS — Z5181 Encounter for therapeutic drug level monitoring: Secondary | ICD-10-CM | POA: Diagnosis not present

## 2016-02-17 DIAGNOSIS — J440 Chronic obstructive pulmonary disease with acute lower respiratory infection: Secondary | ICD-10-CM | POA: Diagnosis not present

## 2016-02-17 DIAGNOSIS — J441 Chronic obstructive pulmonary disease with (acute) exacerbation: Secondary | ICD-10-CM | POA: Diagnosis not present

## 2016-02-17 DIAGNOSIS — Z79899 Other long term (current) drug therapy: Secondary | ICD-10-CM | POA: Diagnosis not present

## 2016-02-18 DIAGNOSIS — F329 Major depressive disorder, single episode, unspecified: Secondary | ICD-10-CM | POA: Diagnosis not present

## 2016-03-01 DIAGNOSIS — J440 Chronic obstructive pulmonary disease with acute lower respiratory infection: Secondary | ICD-10-CM | POA: Diagnosis not present

## 2016-03-01 DIAGNOSIS — J441 Chronic obstructive pulmonary disease with (acute) exacerbation: Secondary | ICD-10-CM | POA: Diagnosis not present

## 2016-03-01 DIAGNOSIS — J189 Pneumonia, unspecified organism: Secondary | ICD-10-CM | POA: Diagnosis not present

## 2016-03-01 DIAGNOSIS — I5030 Unspecified diastolic (congestive) heart failure: Secondary | ICD-10-CM | POA: Diagnosis not present

## 2016-03-01 DIAGNOSIS — E119 Type 2 diabetes mellitus without complications: Secondary | ICD-10-CM | POA: Diagnosis not present

## 2016-03-01 DIAGNOSIS — T464X5D Adverse effect of angiotensin-converting-enzyme inhibitors, subsequent encounter: Secondary | ICD-10-CM | POA: Diagnosis not present

## 2016-03-10 ENCOUNTER — Encounter: Payer: Self-pay | Admitting: *Deleted

## 2016-03-10 DIAGNOSIS — Z1211 Encounter for screening for malignant neoplasm of colon: Secondary | ICD-10-CM | POA: Diagnosis not present

## 2016-03-10 DIAGNOSIS — Z1389 Encounter for screening for other disorder: Secondary | ICD-10-CM | POA: Diagnosis not present

## 2016-03-10 DIAGNOSIS — E7800 Pure hypercholesterolemia, unspecified: Secondary | ICD-10-CM | POA: Diagnosis not present

## 2016-03-10 DIAGNOSIS — Z0000 Encounter for general adult medical examination without abnormal findings: Secondary | ICD-10-CM | POA: Diagnosis not present

## 2016-03-10 DIAGNOSIS — E1140 Type 2 diabetes mellitus with diabetic neuropathy, unspecified: Secondary | ICD-10-CM | POA: Diagnosis not present

## 2016-03-10 DIAGNOSIS — Z79899 Other long term (current) drug therapy: Secondary | ICD-10-CM | POA: Diagnosis not present

## 2016-03-10 DIAGNOSIS — Z299 Encounter for prophylactic measures, unspecified: Secondary | ICD-10-CM | POA: Diagnosis not present

## 2016-03-10 DIAGNOSIS — F319 Bipolar disorder, unspecified: Secondary | ICD-10-CM | POA: Diagnosis not present

## 2016-03-10 DIAGNOSIS — R5383 Other fatigue: Secondary | ICD-10-CM | POA: Diagnosis not present

## 2016-03-10 DIAGNOSIS — Z7189 Other specified counseling: Secondary | ICD-10-CM | POA: Diagnosis not present

## 2016-03-10 NOTE — Progress Notes (Signed)
Cardiology Office Note  Date: 03/11/2016   ID: Brenda Mcdonald, DOB 1941/05/01, MRN [id number]  PCP: Ignatius Specking, MD  Primary Cardiologist: Nona Dell, MD   Chief Complaint  Patient presents with  . Coronary Artery Disease    History of Present Illness: Brenda Mcdonald is a 75 y.o. female last seen in August 2016. She is here today with an assistant from her nursing facility for a routine visit. I do not have complete information, but it sounds like she was recently admitted to [facility] with an allergic reaction to lisinopril. She has also been placed on Lasix for treatment of leg edema. She had a visit with Dr. Sherril Croon yesterday and had lab work, we are requesting the results.  She does not report any obvious angina symptoms. She is functionally limited, as in a wheelchair today. I reviewed her ECG which shows sinus rhythm with incomplete left bundle branch block.  I went over her medication list, updated below. Cardiac regimen is stable other than the recent addition of Lasix.  Past Medical History:  Diagnosis Date  . Anxiety   . Arthritis   . Bipolar disorder (HCC)   . COPD (chronic obstructive pulmonary disease) (HCC)   . Coronary atherosclerosis of native coronary artery    Nonobstructive  . Depression   . Diabetes mellitus, type 2 (HCC)   . Dysphagia   . Essential hypertension, benign   . GERD (gastroesophageal reflux disease)   . Pneumonia   . Pulmonary nodule July 2009   Nonspecific 5 mm right middle lobe  . TIA (transient ischemic attack)     Past Surgical History:  Procedure Laterality Date  . ABDOMINAL HYSTERECTOMY    . APPENDECTOMY    . CARDIAC CATHETERIZATION  11/2010  . CATARACT EXTRACTION, BILATERAL Bilateral   . CHOLECYSTECTOMY    . DILATION AND CURETTAGE OF UTERUS    . INCISION AND DRAINAGE Left 03/12/2014   Procedure: LEFT INCISION/DRAINAGE DEEP ABSCESS/BURSA/HEMATOMA THIGH/KNEE REGION;  Surgeon: [name], MD;  Location: [facility] OR;  Service:  Orthopedics;  Laterality: Left;  . KNEE ARTHROSCOPY Left   . ORIF DISTAL FEMUR FRACTURE Left 01/24/2014  . ORIF FEMUR FRACTURE Left 01/24/2014   Procedure: OPEN REDUCTION INTERNAL FIXATION (ORIF) DISTAL FEMUR FRACTURE;  Surgeon: [name], MD;  Location: [facility] OR;  Service: Orthopedics;  Laterality: Left;  . TIBIA FRACTURE SURGERY Left ~ 1959  . TOTAL KNEE ARTHROPLASTY Left 2000  . VESICOVAGINAL FISTULA CLOSURE W/ TAH      Current Outpatient Prescriptions  Medication Sig Dispense Refill  . albuterol (PROVENTIL HFA;VENTOLIN HFA) 108 (90 BASE) MCG/ACT inhaler Inhale 2 puffs into the lungs every 6 (six) hours as needed for wheezing or shortness of breath.    . ALPRAZolam (XANAX) 0.25 MG tablet Take 0.25 mg by mouth at bedtime.    Marland Kitchen amLODipine (NORVASC) 10 MG tablet Take 10 mg by mouth daily.    Marland Kitchen atorvastatin (LIPITOR) 10 MG tablet TAKE 1 TABLET BY MOUTH AT BEDTIME. 30 tablet 3  . busPIRone (BUSPAR) 10 MG tablet Take 10 mg by mouth 3 (three) times daily.    . furosemide (LASIX) 20 MG tablet Take 1 tablet by mouth daily.    Marland Kitchen glipiZIDE (GLUCOTROL XL) 2.5 MG 24 hr tablet Take 1/2 tab by mouth daily    . HYDROcodone-acetaminophen (NORCO/VICODIN) 5-325 MG per tablet Take 1 tablet by mouth every 4 (four) hours as needed for moderate pain. 30 tablet 0  . ipratropium-albuterol (DUONEB)  0.5-2.5 (3) MG/3ML SOLN Take 3 mLs by nebulization 4 (four) times daily as needed. Shortness of breath    . lamoTRIgine (LAMICTAL) 200 MG tablet Take 200 mg by mouth daily.      . memantine (NAMENDA XR) 14 MG CP24 24 hr capsule Take 14 mg by mouth daily.    . metoprolol tartrate (LOPRESSOR) 25 MG tablet TAKE (1/2) TABLET BY MOUTH TWICE DAILY. 30 tablet 11  . omeprazole (PRILOSEC) 40 MG capsule Take 40 mg by mouth daily.     . QUEtiapine (SEROQUEL) 25 MG tablet Take 25 mg by mouth at bedtime.      . solifenacin (VESICARE) 10 MG tablet Take 10 mg by mouth daily.       No current facility-administered medications for  this visit.    Allergies:  Azithromycin; Penicillins; and Tetracycline   Social History: The patient  reports that she quit smoking about 5 years ago. Her smoking use included Cigarettes. She has a 29.00 pack-year smoking history. She has never used smokeless tobacco. She reports that she does not drink alcohol or use drugs.   ROS:  Please see the history of present illness. Otherwise, complete review of systems is positive for occasional abdominal pain.  All other systems are reviewed and negative.   Physical Exam: VS:  BP 118/71   Pulse 99   Ht 5\' 5"  (1.651 m)   Wt 215 lb (97.5 kg)   SpO2 97%   BMI 35.78 kg/m , BMI Body mass index is 35.78 kg/m.  Wt Readings from Last 3 Encounters:  03/11/16 215 lb (97.5 kg)  04/14/15 200 lb (90.7 kg)  02/25/15 200 lb 12.8 oz (91.1 kg)    Overweight, elderly woman in no acute distress seated in wheelchair.  HEENT: Conjunctiva and lids normal, oropharynx with poor dentition.  Neck: Supple, no elevated JVP or thyromegaly.  Lungs: Diminished but clear breath sounds, non-labored.  Cardiac: Regular rate and rhythm with soft systolic murmur at the apex, no significant diastolic murmur, no S3.  Abdomen: Soft, nontender, bowel sounds present.  Extremities: 2+ leg edema and stasis, distal pulses diminished.  Skin: Warm and dry. Musculoskeletal: No kyphosis. Neuropsychiatric: Alert and oriented 2. Affect appropriate.  ECG: I personally reviewed the tracing from 04/14/2015 which showed sinus rhythm with IVCD.  Recent Labwork: 04/14/2015: BUN 16; Creatinine, Ser 1.29; Hemoglobin 11.3; Platelets 198; Potassium 3.6; Sodium 138   Other Studies Reviewed Today:  Echocardiogram June 2012: LVEF 55-60%, mildly thickened aortic valve with moderate aortic regurgitation, MAC with moderate mitral regurgitation, mild tricuspid regurgitation.  Assessment and Plan:  1. History of nonobstructive CAD, no obvious angina symptoms. Plan is to continue medical  therapy and observation. Current regimen includes Lopressor, Lipitor, and Norvasc. ECG reviewed.  2. Essential hypertension, blood pressure is well controlled today.  3. Leg edema, recently placed on Lasix and followed by Dr. July 2012. LVEF was normal range as of 2012 although she did have moderate mitral regurgitation at that time. Expect she will need to stay on diuretic therapy.  4. Hyperlipidemia, on Lipitor. Requesting most recent lab work from Dr. 2013.  Current medicines were reviewed with the patient today.   Orders Placed This Encounter  Procedures  . EKG 12-Lead    Disposition: Follow-up with me in one year.  Signed, Sherril Croon, MD, [facility] 03/11/2016 1:41 PM    [facility] [address] [place], [place], [place] Phone: [phone]; Fax: [phone]

## 2016-03-11 ENCOUNTER — Encounter: Payer: Self-pay | Admitting: *Deleted

## 2016-03-11 ENCOUNTER — Ambulatory Visit (INDEPENDENT_AMBULATORY_CARE_PROVIDER_SITE_OTHER): Payer: Medicare Other | Admitting: Cardiology

## 2016-03-11 ENCOUNTER — Other Ambulatory Visit: Payer: Self-pay | Admitting: Cardiology

## 2016-03-11 ENCOUNTER — Encounter: Payer: Self-pay | Admitting: Cardiology

## 2016-03-11 VITALS — BP 118/71 | HR 99 | Ht 65.0 in | Wt 215.0 lb

## 2016-03-11 DIAGNOSIS — I38 Endocarditis, valve unspecified: Secondary | ICD-10-CM

## 2016-03-11 DIAGNOSIS — I2510 Atherosclerotic heart disease of native coronary artery without angina pectoris: Secondary | ICD-10-CM | POA: Diagnosis not present

## 2016-03-11 DIAGNOSIS — E785 Hyperlipidemia, unspecified: Secondary | ICD-10-CM

## 2016-03-11 DIAGNOSIS — R600 Localized edema: Secondary | ICD-10-CM

## 2016-03-11 NOTE — Patient Instructions (Signed)

## 2016-03-17 DIAGNOSIS — T464X5D Adverse effect of angiotensin-converting-enzyme inhibitors, subsequent encounter: Secondary | ICD-10-CM | POA: Diagnosis not present

## 2016-03-17 DIAGNOSIS — J189 Pneumonia, unspecified organism: Secondary | ICD-10-CM | POA: Diagnosis not present

## 2016-03-17 DIAGNOSIS — J440 Chronic obstructive pulmonary disease with acute lower respiratory infection: Secondary | ICD-10-CM | POA: Diagnosis not present

## 2016-03-17 DIAGNOSIS — J441 Chronic obstructive pulmonary disease with (acute) exacerbation: Secondary | ICD-10-CM | POA: Diagnosis not present

## 2016-03-17 DIAGNOSIS — I5030 Unspecified diastolic (congestive) heart failure: Secondary | ICD-10-CM | POA: Diagnosis not present

## 2016-03-17 DIAGNOSIS — E119 Type 2 diabetes mellitus without complications: Secondary | ICD-10-CM | POA: Diagnosis not present

## 2016-03-22 ENCOUNTER — Ambulatory Visit: Payer: Self-pay | Admitting: "Endocrinology

## 2016-04-16 DIAGNOSIS — E119 Type 2 diabetes mellitus without complications: Secondary | ICD-10-CM | POA: Diagnosis not present

## 2016-04-16 DIAGNOSIS — I10 Essential (primary) hypertension: Secondary | ICD-10-CM | POA: Diagnosis not present

## 2016-05-04 DIAGNOSIS — J449 Chronic obstructive pulmonary disease, unspecified: Secondary | ICD-10-CM | POA: Diagnosis not present

## 2016-05-04 DIAGNOSIS — M545 Low back pain: Secondary | ICD-10-CM | POA: Diagnosis not present

## 2016-05-04 DIAGNOSIS — Z6833 Body mass index (BMI) 33.0-33.9, adult: Secondary | ICD-10-CM | POA: Diagnosis not present

## 2016-05-04 DIAGNOSIS — T2115XA Burn of first degree of buttock, initial encounter: Secondary | ICD-10-CM | POA: Diagnosis not present

## 2016-05-04 DIAGNOSIS — Z299 Encounter for prophylactic measures, unspecified: Secondary | ICD-10-CM | POA: Diagnosis not present

## 2016-05-11 DIAGNOSIS — F329 Major depressive disorder, single episode, unspecified: Secondary | ICD-10-CM | POA: Diagnosis not present

## 2016-05-17 DIAGNOSIS — E119 Type 2 diabetes mellitus without complications: Secondary | ICD-10-CM | POA: Diagnosis not present

## 2016-05-17 DIAGNOSIS — I10 Essential (primary) hypertension: Secondary | ICD-10-CM | POA: Diagnosis not present

## 2016-06-08 ENCOUNTER — Other Ambulatory Visit: Payer: Self-pay | Admitting: Cardiology

## 2016-06-10 DIAGNOSIS — E119 Type 2 diabetes mellitus without complications: Secondary | ICD-10-CM | POA: Diagnosis not present

## 2016-06-10 DIAGNOSIS — I10 Essential (primary) hypertension: Secondary | ICD-10-CM | POA: Diagnosis not present

## 2016-07-02 DIAGNOSIS — Z299 Encounter for prophylactic measures, unspecified: Secondary | ICD-10-CM | POA: Diagnosis not present

## 2016-07-02 DIAGNOSIS — R600 Localized edema: Secondary | ICD-10-CM | POA: Diagnosis not present

## 2016-07-02 DIAGNOSIS — Z6836 Body mass index (BMI) 36.0-36.9, adult: Secondary | ICD-10-CM | POA: Diagnosis not present

## 2016-07-02 DIAGNOSIS — M545 Low back pain: Secondary | ICD-10-CM | POA: Diagnosis not present

## 2016-07-02 DIAGNOSIS — E1140 Type 2 diabetes mellitus with diabetic neuropathy, unspecified: Secondary | ICD-10-CM | POA: Diagnosis not present

## 2016-07-16 DIAGNOSIS — R350 Frequency of micturition: Secondary | ICD-10-CM | POA: Diagnosis not present

## 2016-07-16 DIAGNOSIS — Z299 Encounter for prophylactic measures, unspecified: Secondary | ICD-10-CM | POA: Diagnosis not present

## 2016-07-16 DIAGNOSIS — N390 Urinary tract infection, site not specified: Secondary | ICD-10-CM | POA: Diagnosis not present

## 2016-07-16 DIAGNOSIS — F319 Bipolar disorder, unspecified: Secondary | ICD-10-CM | POA: Diagnosis not present

## 2016-07-16 DIAGNOSIS — I2510 Atherosclerotic heart disease of native coronary artery without angina pectoris: Secondary | ICD-10-CM | POA: Diagnosis not present

## 2016-07-16 DIAGNOSIS — J449 Chronic obstructive pulmonary disease, unspecified: Secondary | ICD-10-CM | POA: Diagnosis not present

## 2016-07-16 DIAGNOSIS — Z6836 Body mass index (BMI) 36.0-36.9, adult: Secondary | ICD-10-CM | POA: Diagnosis not present

## 2016-07-22 DIAGNOSIS — E119 Type 2 diabetes mellitus without complications: Secondary | ICD-10-CM | POA: Diagnosis not present

## 2016-07-22 DIAGNOSIS — I10 Essential (primary) hypertension: Secondary | ICD-10-CM | POA: Diagnosis not present

## 2016-08-02 DIAGNOSIS — I509 Heart failure, unspecified: Secondary | ICD-10-CM | POA: Diagnosis not present

## 2016-08-02 DIAGNOSIS — J449 Chronic obstructive pulmonary disease, unspecified: Secondary | ICD-10-CM | POA: Diagnosis not present

## 2016-08-02 DIAGNOSIS — I110 Hypertensive heart disease with heart failure: Secondary | ICD-10-CM | POA: Diagnosis not present

## 2016-08-02 DIAGNOSIS — E119 Type 2 diabetes mellitus without complications: Secondary | ICD-10-CM | POA: Diagnosis not present

## 2016-08-02 DIAGNOSIS — F319 Bipolar disorder, unspecified: Secondary | ICD-10-CM | POA: Diagnosis not present

## 2016-08-02 DIAGNOSIS — R05 Cough: Secondary | ICD-10-CM | POA: Diagnosis not present

## 2016-08-02 DIAGNOSIS — J1089 Influenza due to other identified influenza virus with other manifestations: Secondary | ICD-10-CM | POA: Diagnosis not present

## 2016-08-02 DIAGNOSIS — J101 Influenza due to other identified influenza virus with other respiratory manifestations: Secondary | ICD-10-CM | POA: Diagnosis not present

## 2016-08-03 DIAGNOSIS — J1089 Influenza due to other identified influenza virus with other manifestations: Secondary | ICD-10-CM | POA: Diagnosis not present

## 2016-08-04 ENCOUNTER — Emergency Department (HOSPITAL_COMMUNITY)
Admission: EM | Admit: 2016-08-04 | Discharge: 2016-08-04 | Discharge status: Against Medical Advice | Payer: Medicare Other | Attending: Dermatology | Admitting: Dermatology

## 2016-08-04 ENCOUNTER — Encounter (HOSPITAL_COMMUNITY): Payer: Self-pay | Admitting: Emergency Medicine

## 2016-08-04 DIAGNOSIS — Z7984 Long term (current) use of oral hypoglycemic drugs: Secondary | ICD-10-CM | POA: Insufficient documentation

## 2016-08-04 DIAGNOSIS — E119 Type 2 diabetes mellitus without complications: Secondary | ICD-10-CM | POA: Diagnosis not present

## 2016-08-04 DIAGNOSIS — J111 Influenza due to unidentified influenza virus with other respiratory manifestations: Secondary | ICD-10-CM | POA: Diagnosis not present

## 2016-08-04 DIAGNOSIS — I10 Essential (primary) hypertension: Secondary | ICD-10-CM | POA: Insufficient documentation

## 2016-08-04 DIAGNOSIS — Z79899 Other long term (current) drug therapy: Secondary | ICD-10-CM | POA: Diagnosis not present

## 2016-08-04 DIAGNOSIS — J449 Chronic obstructive pulmonary disease, unspecified: Secondary | ICD-10-CM | POA: Diagnosis not present

## 2016-08-04 DIAGNOSIS — Z87891 Personal history of nicotine dependence: Secondary | ICD-10-CM | POA: Diagnosis not present

## 2016-08-04 NOTE — ED Triage Notes (Signed)
Pt's daughter says she just had CXR and would like to wait to see MD.

## 2016-08-04 NOTE — ED Triage Notes (Signed)
Seen [facility] on Monday and dx. With flu type A.  Treated with fluids and tamiflu.  Pt c/o body aches.  Rates pain 9/10, took hydrocodone at 10 am this am without relief.

## 2016-08-04 NOTE — ED Notes (Signed)
Pt called to go back to room with no answer x3.

## 2016-08-08 DIAGNOSIS — I110 Hypertensive heart disease with heart failure: Secondary | ICD-10-CM | POA: Diagnosis present

## 2016-08-08 DIAGNOSIS — J440 Chronic obstructive pulmonary disease with acute lower respiratory infection: Secondary | ICD-10-CM | POA: Diagnosis not present

## 2016-08-08 DIAGNOSIS — Z23 Encounter for immunization: Secondary | ICD-10-CM | POA: Diagnosis not present

## 2016-08-08 DIAGNOSIS — E119 Type 2 diabetes mellitus without complications: Secondary | ICD-10-CM | POA: Diagnosis present

## 2016-08-08 DIAGNOSIS — R2689 Other abnormalities of gait and mobility: Secondary | ICD-10-CM | POA: Diagnosis not present

## 2016-08-08 DIAGNOSIS — Z96652 Presence of left artificial knee joint: Secondary | ICD-10-CM | POA: Diagnosis present

## 2016-08-08 DIAGNOSIS — I5032 Chronic diastolic (congestive) heart failure: Secondary | ICD-10-CM | POA: Diagnosis present

## 2016-08-08 DIAGNOSIS — E876 Hypokalemia: Secondary | ICD-10-CM | POA: Diagnosis not present

## 2016-08-08 DIAGNOSIS — R5383 Other fatigue: Secondary | ICD-10-CM | POA: Diagnosis not present

## 2016-08-08 DIAGNOSIS — J181 Lobar pneumonia, unspecified organism: Secondary | ICD-10-CM | POA: Diagnosis not present

## 2016-08-08 DIAGNOSIS — B9620 Unspecified Escherichia coli [E. coli] as the cause of diseases classified elsewhere: Secondary | ICD-10-CM | POA: Diagnosis not present

## 2016-08-08 DIAGNOSIS — K219 Gastro-esophageal reflux disease without esophagitis: Secondary | ICD-10-CM | POA: Diagnosis not present

## 2016-08-08 DIAGNOSIS — R0602 Shortness of breath: Secondary | ICD-10-CM | POA: Diagnosis not present

## 2016-08-08 DIAGNOSIS — M6281 Muscle weakness (generalized): Secondary | ICD-10-CM | POA: Diagnosis not present

## 2016-08-08 DIAGNOSIS — Z8249 Family history of ischemic heart disease and other diseases of the circulatory system: Secondary | ICD-10-CM | POA: Diagnosis not present

## 2016-08-08 DIAGNOSIS — Z881 Allergy status to other antibiotic agents status: Secondary | ICD-10-CM | POA: Diagnosis not present

## 2016-08-08 DIAGNOSIS — R4182 Altered mental status, unspecified: Secondary | ICD-10-CM | POA: Diagnosis not present

## 2016-08-08 DIAGNOSIS — Z79891 Long term (current) use of opiate analgesic: Secondary | ICD-10-CM | POA: Diagnosis not present

## 2016-08-08 DIAGNOSIS — Z880 Allergy status to penicillin: Secondary | ICD-10-CM | POA: Diagnosis not present

## 2016-08-08 DIAGNOSIS — F319 Bipolar disorder, unspecified: Secondary | ICD-10-CM | POA: Diagnosis not present

## 2016-08-08 DIAGNOSIS — I482 Chronic atrial fibrillation: Secondary | ICD-10-CM | POA: Diagnosis not present

## 2016-08-08 DIAGNOSIS — R0603 Acute respiratory distress: Secondary | ICD-10-CM | POA: Diagnosis not present

## 2016-08-08 DIAGNOSIS — I10 Essential (primary) hypertension: Secondary | ICD-10-CM | POA: Diagnosis not present

## 2016-08-08 DIAGNOSIS — R0902 Hypoxemia: Secondary | ICD-10-CM | POA: Diagnosis not present

## 2016-08-08 DIAGNOSIS — N390 Urinary tract infection, site not specified: Secondary | ICD-10-CM | POA: Diagnosis not present

## 2016-08-08 DIAGNOSIS — F419 Anxiety disorder, unspecified: Secondary | ICD-10-CM | POA: Diagnosis present

## 2016-08-08 DIAGNOSIS — J189 Pneumonia, unspecified organism: Secondary | ICD-10-CM | POA: Diagnosis not present

## 2016-08-08 DIAGNOSIS — N12 Tubulo-interstitial nephritis, not specified as acute or chronic: Secondary | ICD-10-CM | POA: Diagnosis not present

## 2016-08-08 DIAGNOSIS — R278 Other lack of coordination: Secondary | ICD-10-CM | POA: Diagnosis not present

## 2016-08-08 DIAGNOSIS — J441 Chronic obstructive pulmonary disease with (acute) exacerbation: Secondary | ICD-10-CM | POA: Diagnosis not present

## 2016-08-08 DIAGNOSIS — Z833 Family history of diabetes mellitus: Secondary | ICD-10-CM | POA: Diagnosis not present

## 2016-08-08 DIAGNOSIS — Z888 Allergy status to other drugs, medicaments and biological substances status: Secondary | ICD-10-CM | POA: Diagnosis not present

## 2016-08-08 DIAGNOSIS — Z87891 Personal history of nicotine dependence: Secondary | ICD-10-CM | POA: Diagnosis not present

## 2016-08-08 DIAGNOSIS — R0682 Tachypnea, not elsewhere classified: Secondary | ICD-10-CM | POA: Diagnosis not present

## 2016-08-12 DIAGNOSIS — M6281 Muscle weakness (generalized): Secondary | ICD-10-CM | POA: Diagnosis not present

## 2016-08-12 DIAGNOSIS — I5030 Unspecified diastolic (congestive) heart failure: Secondary | ICD-10-CM | POA: Diagnosis not present

## 2016-08-12 DIAGNOSIS — R2689 Other abnormalities of gait and mobility: Secondary | ICD-10-CM | POA: Diagnosis not present

## 2016-08-12 DIAGNOSIS — F419 Anxiety disorder, unspecified: Secondary | ICD-10-CM | POA: Diagnosis not present

## 2016-08-12 DIAGNOSIS — L0390 Cellulitis, unspecified: Secondary | ICD-10-CM | POA: Diagnosis not present

## 2016-08-12 DIAGNOSIS — J449 Chronic obstructive pulmonary disease, unspecified: Secondary | ICD-10-CM | POA: Diagnosis not present

## 2016-08-12 DIAGNOSIS — R0902 Hypoxemia: Secondary | ICD-10-CM | POA: Diagnosis not present

## 2016-08-12 DIAGNOSIS — F329 Major depressive disorder, single episode, unspecified: Secondary | ICD-10-CM | POA: Diagnosis not present

## 2016-08-12 DIAGNOSIS — J189 Pneumonia, unspecified organism: Secondary | ICD-10-CM | POA: Diagnosis not present

## 2016-08-12 DIAGNOSIS — J440 Chronic obstructive pulmonary disease with acute lower respiratory infection: Secondary | ICD-10-CM | POA: Diagnosis not present

## 2016-08-12 DIAGNOSIS — K219 Gastro-esophageal reflux disease without esophagitis: Secondary | ICD-10-CM | POA: Diagnosis not present

## 2016-08-12 DIAGNOSIS — E876 Hypokalemia: Secondary | ICD-10-CM | POA: Diagnosis not present

## 2016-08-12 DIAGNOSIS — E119 Type 2 diabetes mellitus without complications: Secondary | ICD-10-CM | POA: Diagnosis not present

## 2016-08-12 DIAGNOSIS — R278 Other lack of coordination: Secondary | ICD-10-CM | POA: Diagnosis not present

## 2016-08-12 DIAGNOSIS — B9620 Unspecified Escherichia coli [E. coli] as the cause of diseases classified elsewhere: Secondary | ICD-10-CM | POA: Diagnosis not present

## 2016-08-12 DIAGNOSIS — J441 Chronic obstructive pulmonary disease with (acute) exacerbation: Secondary | ICD-10-CM | POA: Diagnosis not present

## 2016-08-12 DIAGNOSIS — I10 Essential (primary) hypertension: Secondary | ICD-10-CM | POA: Diagnosis not present

## 2016-08-12 DIAGNOSIS — R0603 Acute respiratory distress: Secondary | ICD-10-CM | POA: Diagnosis not present

## 2016-08-12 DIAGNOSIS — N390 Urinary tract infection, site not specified: Secondary | ICD-10-CM | POA: Diagnosis not present

## 2016-08-12 DIAGNOSIS — Z23 Encounter for immunization: Secondary | ICD-10-CM | POA: Diagnosis not present

## 2016-08-12 DIAGNOSIS — L03116 Cellulitis of left lower limb: Secondary | ICD-10-CM | POA: Diagnosis not present

## 2016-08-12 DIAGNOSIS — F319 Bipolar disorder, unspecified: Secondary | ICD-10-CM | POA: Diagnosis not present

## 2016-08-13 DIAGNOSIS — J189 Pneumonia, unspecified organism: Secondary | ICD-10-CM | POA: Diagnosis not present

## 2016-08-13 DIAGNOSIS — N390 Urinary tract infection, site not specified: Secondary | ICD-10-CM | POA: Diagnosis not present

## 2016-08-13 DIAGNOSIS — E119 Type 2 diabetes mellitus without complications: Secondary | ICD-10-CM | POA: Diagnosis not present

## 2016-08-26 DIAGNOSIS — L0390 Cellulitis, unspecified: Secondary | ICD-10-CM | POA: Diagnosis not present

## 2016-08-26 DIAGNOSIS — I5030 Unspecified diastolic (congestive) heart failure: Secondary | ICD-10-CM | POA: Diagnosis not present

## 2016-08-26 DIAGNOSIS — J449 Chronic obstructive pulmonary disease, unspecified: Secondary | ICD-10-CM | POA: Diagnosis not present

## 2016-08-26 DIAGNOSIS — I10 Essential (primary) hypertension: Secondary | ICD-10-CM | POA: Diagnosis not present

## 2016-09-16 DIAGNOSIS — F329 Major depressive disorder, single episode, unspecified: Secondary | ICD-10-CM | POA: Diagnosis not present

## 2016-09-30 ENCOUNTER — Other Ambulatory Visit: Payer: Self-pay | Admitting: Cardiology

## 2016-10-24 DIAGNOSIS — J449 Chronic obstructive pulmonary disease, unspecified: Secondary | ICD-10-CM | POA: Diagnosis not present

## 2016-10-24 DIAGNOSIS — E119 Type 2 diabetes mellitus without complications: Secondary | ICD-10-CM | POA: Diagnosis not present

## 2016-10-24 DIAGNOSIS — I5030 Unspecified diastolic (congestive) heart failure: Secondary | ICD-10-CM | POA: Diagnosis not present

## 2016-10-24 DIAGNOSIS — K219 Gastro-esophageal reflux disease without esophagitis: Secondary | ICD-10-CM | POA: Diagnosis not present

## 2016-10-24 DIAGNOSIS — B9620 Unspecified Escherichia coli [E. coli] as the cause of diseases classified elsewhere: Secondary | ICD-10-CM | POA: Diagnosis not present

## 2016-10-24 DIAGNOSIS — N390 Urinary tract infection, site not specified: Secondary | ICD-10-CM | POA: Diagnosis not present

## 2016-10-24 DIAGNOSIS — F319 Bipolar disorder, unspecified: Secondary | ICD-10-CM | POA: Diagnosis not present

## 2016-10-24 DIAGNOSIS — R278 Other lack of coordination: Secondary | ICD-10-CM | POA: Diagnosis not present

## 2016-10-24 DIAGNOSIS — J189 Pneumonia, unspecified organism: Secondary | ICD-10-CM | POA: Diagnosis not present

## 2016-10-24 DIAGNOSIS — R2689 Other abnormalities of gait and mobility: Secondary | ICD-10-CM | POA: Diagnosis not present

## 2016-10-24 DIAGNOSIS — F419 Anxiety disorder, unspecified: Secondary | ICD-10-CM | POA: Diagnosis not present

## 2016-10-24 DIAGNOSIS — M6281 Muscle weakness (generalized): Secondary | ICD-10-CM | POA: Diagnosis not present

## 2016-10-24 DIAGNOSIS — R0603 Acute respiratory distress: Secondary | ICD-10-CM | POA: Diagnosis not present

## 2016-10-24 DIAGNOSIS — J440 Chronic obstructive pulmonary disease with acute lower respiratory infection: Secondary | ICD-10-CM | POA: Diagnosis not present

## 2016-10-24 DIAGNOSIS — E876 Hypokalemia: Secondary | ICD-10-CM | POA: Diagnosis not present

## 2016-10-24 DIAGNOSIS — R0902 Hypoxemia: Secondary | ICD-10-CM | POA: Diagnosis not present

## 2016-10-24 DIAGNOSIS — L03116 Cellulitis of left lower limb: Secondary | ICD-10-CM | POA: Diagnosis not present

## 2016-10-24 DIAGNOSIS — I10 Essential (primary) hypertension: Secondary | ICD-10-CM | POA: Diagnosis not present

## 2016-10-24 DIAGNOSIS — J441 Chronic obstructive pulmonary disease with (acute) exacerbation: Secondary | ICD-10-CM | POA: Diagnosis not present

## 2016-10-28 DIAGNOSIS — J449 Chronic obstructive pulmonary disease, unspecified: Secondary | ICD-10-CM | POA: Diagnosis not present

## 2016-10-28 DIAGNOSIS — L03116 Cellulitis of left lower limb: Secondary | ICD-10-CM | POA: Diagnosis not present

## 2016-10-28 DIAGNOSIS — I10 Essential (primary) hypertension: Secondary | ICD-10-CM | POA: Diagnosis not present

## 2016-10-28 DIAGNOSIS — I5030 Unspecified diastolic (congestive) heart failure: Secondary | ICD-10-CM | POA: Diagnosis not present

## 2016-11-01 DIAGNOSIS — Z87440 Personal history of urinary (tract) infections: Secondary | ICD-10-CM | POA: Diagnosis not present

## 2016-11-01 DIAGNOSIS — F319 Bipolar disorder, unspecified: Secondary | ICD-10-CM | POA: Diagnosis not present

## 2016-11-01 DIAGNOSIS — F419 Anxiety disorder, unspecified: Secondary | ICD-10-CM | POA: Diagnosis not present

## 2016-11-01 DIAGNOSIS — J441 Chronic obstructive pulmonary disease with (acute) exacerbation: Secondary | ICD-10-CM | POA: Diagnosis not present

## 2016-11-01 DIAGNOSIS — Z8701 Personal history of pneumonia (recurrent): Secondary | ICD-10-CM | POA: Diagnosis not present

## 2016-11-01 DIAGNOSIS — I110 Hypertensive heart disease with heart failure: Secondary | ICD-10-CM | POA: Diagnosis not present

## 2016-11-01 DIAGNOSIS — E119 Type 2 diabetes mellitus without complications: Secondary | ICD-10-CM | POA: Diagnosis not present

## 2016-11-01 DIAGNOSIS — Z7984 Long term (current) use of oral hypoglycemic drugs: Secondary | ICD-10-CM | POA: Diagnosis not present

## 2016-11-01 DIAGNOSIS — I5030 Unspecified diastolic (congestive) heart failure: Secondary | ICD-10-CM | POA: Diagnosis not present

## 2016-11-02 DIAGNOSIS — J441 Chronic obstructive pulmonary disease with (acute) exacerbation: Secondary | ICD-10-CM | POA: Diagnosis not present

## 2016-11-02 DIAGNOSIS — F419 Anxiety disorder, unspecified: Secondary | ICD-10-CM | POA: Diagnosis not present

## 2016-11-02 DIAGNOSIS — E119 Type 2 diabetes mellitus without complications: Secondary | ICD-10-CM | POA: Diagnosis not present

## 2016-11-02 DIAGNOSIS — I5030 Unspecified diastolic (congestive) heart failure: Secondary | ICD-10-CM | POA: Diagnosis not present

## 2016-11-02 DIAGNOSIS — F319 Bipolar disorder, unspecified: Secondary | ICD-10-CM | POA: Diagnosis not present

## 2016-11-02 DIAGNOSIS — I110 Hypertensive heart disease with heart failure: Secondary | ICD-10-CM | POA: Diagnosis not present

## 2016-11-03 DIAGNOSIS — E119 Type 2 diabetes mellitus without complications: Secondary | ICD-10-CM | POA: Diagnosis not present

## 2016-11-03 DIAGNOSIS — I110 Hypertensive heart disease with heart failure: Secondary | ICD-10-CM | POA: Diagnosis not present

## 2016-11-03 DIAGNOSIS — F319 Bipolar disorder, unspecified: Secondary | ICD-10-CM | POA: Diagnosis not present

## 2016-11-03 DIAGNOSIS — J441 Chronic obstructive pulmonary disease with (acute) exacerbation: Secondary | ICD-10-CM | POA: Diagnosis not present

## 2016-11-03 DIAGNOSIS — I5030 Unspecified diastolic (congestive) heart failure: Secondary | ICD-10-CM | POA: Diagnosis not present

## 2016-11-03 DIAGNOSIS — F419 Anxiety disorder, unspecified: Secondary | ICD-10-CM | POA: Diagnosis not present

## 2016-11-04 DIAGNOSIS — J441 Chronic obstructive pulmonary disease with (acute) exacerbation: Secondary | ICD-10-CM | POA: Diagnosis not present

## 2016-11-04 DIAGNOSIS — F319 Bipolar disorder, unspecified: Secondary | ICD-10-CM | POA: Diagnosis not present

## 2016-11-04 DIAGNOSIS — F0390 Unspecified dementia without behavioral disturbance: Secondary | ICD-10-CM | POA: Diagnosis not present

## 2016-11-04 DIAGNOSIS — I5030 Unspecified diastolic (congestive) heart failure: Secondary | ICD-10-CM | POA: Diagnosis not present

## 2016-11-04 DIAGNOSIS — J449 Chronic obstructive pulmonary disease, unspecified: Secondary | ICD-10-CM | POA: Diagnosis not present

## 2016-11-04 DIAGNOSIS — N183 Chronic kidney disease, stage 3 (moderate): Secondary | ICD-10-CM | POA: Diagnosis not present

## 2016-11-04 DIAGNOSIS — E119 Type 2 diabetes mellitus without complications: Secondary | ICD-10-CM | POA: Diagnosis not present

## 2016-11-04 DIAGNOSIS — M159 Polyosteoarthritis, unspecified: Secondary | ICD-10-CM | POA: Diagnosis not present

## 2016-11-04 DIAGNOSIS — F419 Anxiety disorder, unspecified: Secondary | ICD-10-CM | POA: Diagnosis not present

## 2016-11-04 DIAGNOSIS — Z299 Encounter for prophylactic measures, unspecified: Secondary | ICD-10-CM | POA: Diagnosis not present

## 2016-11-04 DIAGNOSIS — Z87891 Personal history of nicotine dependence: Secondary | ICD-10-CM | POA: Diagnosis not present

## 2016-11-04 DIAGNOSIS — I110 Hypertensive heart disease with heart failure: Secondary | ICD-10-CM | POA: Diagnosis not present

## 2016-11-04 DIAGNOSIS — F329 Major depressive disorder, single episode, unspecified: Secondary | ICD-10-CM | POA: Diagnosis not present

## 2016-11-04 DIAGNOSIS — I10 Essential (primary) hypertension: Secondary | ICD-10-CM | POA: Diagnosis not present

## 2016-11-04 DIAGNOSIS — E785 Hyperlipidemia, unspecified: Secondary | ICD-10-CM | POA: Diagnosis not present

## 2016-11-04 DIAGNOSIS — E1122 Type 2 diabetes mellitus with diabetic chronic kidney disease: Secondary | ICD-10-CM | POA: Diagnosis not present

## 2016-11-05 ENCOUNTER — Other Ambulatory Visit: Payer: Self-pay | Admitting: Cardiology

## 2016-11-05 DIAGNOSIS — I5030 Unspecified diastolic (congestive) heart failure: Secondary | ICD-10-CM | POA: Diagnosis not present

## 2016-11-05 DIAGNOSIS — F419 Anxiety disorder, unspecified: Secondary | ICD-10-CM | POA: Diagnosis not present

## 2016-11-05 DIAGNOSIS — E119 Type 2 diabetes mellitus without complications: Secondary | ICD-10-CM | POA: Diagnosis not present

## 2016-11-05 DIAGNOSIS — J441 Chronic obstructive pulmonary disease with (acute) exacerbation: Secondary | ICD-10-CM | POA: Diagnosis not present

## 2016-11-05 DIAGNOSIS — F319 Bipolar disorder, unspecified: Secondary | ICD-10-CM | POA: Diagnosis not present

## 2016-11-05 DIAGNOSIS — I110 Hypertensive heart disease with heart failure: Secondary | ICD-10-CM | POA: Diagnosis not present

## 2016-11-08 DIAGNOSIS — E119 Type 2 diabetes mellitus without complications: Secondary | ICD-10-CM | POA: Diagnosis not present

## 2016-11-08 DIAGNOSIS — I110 Hypertensive heart disease with heart failure: Secondary | ICD-10-CM | POA: Diagnosis not present

## 2016-11-08 DIAGNOSIS — I5030 Unspecified diastolic (congestive) heart failure: Secondary | ICD-10-CM | POA: Diagnosis not present

## 2016-11-08 DIAGNOSIS — F319 Bipolar disorder, unspecified: Secondary | ICD-10-CM | POA: Diagnosis not present

## 2016-11-08 DIAGNOSIS — F419 Anxiety disorder, unspecified: Secondary | ICD-10-CM | POA: Diagnosis not present

## 2016-11-08 DIAGNOSIS — J441 Chronic obstructive pulmonary disease with (acute) exacerbation: Secondary | ICD-10-CM | POA: Diagnosis not present

## 2016-11-09 DIAGNOSIS — I110 Hypertensive heart disease with heart failure: Secondary | ICD-10-CM | POA: Diagnosis not present

## 2016-11-09 DIAGNOSIS — F419 Anxiety disorder, unspecified: Secondary | ICD-10-CM | POA: Diagnosis not present

## 2016-11-09 DIAGNOSIS — J441 Chronic obstructive pulmonary disease with (acute) exacerbation: Secondary | ICD-10-CM | POA: Diagnosis not present

## 2016-11-09 DIAGNOSIS — F319 Bipolar disorder, unspecified: Secondary | ICD-10-CM | POA: Diagnosis not present

## 2016-11-09 DIAGNOSIS — I5030 Unspecified diastolic (congestive) heart failure: Secondary | ICD-10-CM | POA: Diagnosis not present

## 2016-11-09 DIAGNOSIS — E119 Type 2 diabetes mellitus without complications: Secondary | ICD-10-CM | POA: Diagnosis not present

## 2016-11-10 DIAGNOSIS — J441 Chronic obstructive pulmonary disease with (acute) exacerbation: Secondary | ICD-10-CM | POA: Diagnosis not present

## 2016-11-10 DIAGNOSIS — F319 Bipolar disorder, unspecified: Secondary | ICD-10-CM | POA: Diagnosis not present

## 2016-11-10 DIAGNOSIS — F419 Anxiety disorder, unspecified: Secondary | ICD-10-CM | POA: Diagnosis not present

## 2016-11-10 DIAGNOSIS — I5030 Unspecified diastolic (congestive) heart failure: Secondary | ICD-10-CM | POA: Diagnosis not present

## 2016-11-10 DIAGNOSIS — E119 Type 2 diabetes mellitus without complications: Secondary | ICD-10-CM | POA: Diagnosis not present

## 2016-11-10 DIAGNOSIS — I110 Hypertensive heart disease with heart failure: Secondary | ICD-10-CM | POA: Diagnosis not present

## 2016-11-11 DIAGNOSIS — J441 Chronic obstructive pulmonary disease with (acute) exacerbation: Secondary | ICD-10-CM | POA: Diagnosis not present

## 2016-11-11 DIAGNOSIS — I110 Hypertensive heart disease with heart failure: Secondary | ICD-10-CM | POA: Diagnosis not present

## 2016-11-11 DIAGNOSIS — F319 Bipolar disorder, unspecified: Secondary | ICD-10-CM | POA: Diagnosis not present

## 2016-11-11 DIAGNOSIS — I5030 Unspecified diastolic (congestive) heart failure: Secondary | ICD-10-CM | POA: Diagnosis not present

## 2016-11-11 DIAGNOSIS — E119 Type 2 diabetes mellitus without complications: Secondary | ICD-10-CM | POA: Diagnosis not present

## 2016-11-11 DIAGNOSIS — F419 Anxiety disorder, unspecified: Secondary | ICD-10-CM | POA: Diagnosis not present

## 2016-11-12 DIAGNOSIS — I110 Hypertensive heart disease with heart failure: Secondary | ICD-10-CM | POA: Diagnosis not present

## 2016-11-12 DIAGNOSIS — J441 Chronic obstructive pulmonary disease with (acute) exacerbation: Secondary | ICD-10-CM | POA: Diagnosis not present

## 2016-11-12 DIAGNOSIS — F319 Bipolar disorder, unspecified: Secondary | ICD-10-CM | POA: Diagnosis not present

## 2016-11-12 DIAGNOSIS — E119 Type 2 diabetes mellitus without complications: Secondary | ICD-10-CM | POA: Diagnosis not present

## 2016-11-12 DIAGNOSIS — F419 Anxiety disorder, unspecified: Secondary | ICD-10-CM | POA: Diagnosis not present

## 2016-11-12 DIAGNOSIS — I5030 Unspecified diastolic (congestive) heart failure: Secondary | ICD-10-CM | POA: Diagnosis not present

## 2016-11-15 DIAGNOSIS — E119 Type 2 diabetes mellitus without complications: Secondary | ICD-10-CM | POA: Diagnosis not present

## 2016-11-15 DIAGNOSIS — J441 Chronic obstructive pulmonary disease with (acute) exacerbation: Secondary | ICD-10-CM | POA: Diagnosis not present

## 2016-11-15 DIAGNOSIS — I110 Hypertensive heart disease with heart failure: Secondary | ICD-10-CM | POA: Diagnosis not present

## 2016-11-15 DIAGNOSIS — F419 Anxiety disorder, unspecified: Secondary | ICD-10-CM | POA: Diagnosis not present

## 2016-11-15 DIAGNOSIS — F319 Bipolar disorder, unspecified: Secondary | ICD-10-CM | POA: Diagnosis not present

## 2016-11-15 DIAGNOSIS — I5030 Unspecified diastolic (congestive) heart failure: Secondary | ICD-10-CM | POA: Diagnosis not present

## 2016-11-16 DIAGNOSIS — I10 Essential (primary) hypertension: Secondary | ICD-10-CM | POA: Diagnosis not present

## 2016-11-16 DIAGNOSIS — E785 Hyperlipidemia, unspecified: Secondary | ICD-10-CM | POA: Diagnosis not present

## 2016-11-16 DIAGNOSIS — F329 Major depressive disorder, single episode, unspecified: Secondary | ICD-10-CM | POA: Diagnosis not present

## 2016-11-16 DIAGNOSIS — E1165 Type 2 diabetes mellitus with hyperglycemia: Secondary | ICD-10-CM | POA: Diagnosis not present

## 2016-11-16 DIAGNOSIS — F0390 Unspecified dementia without behavioral disturbance: Secondary | ICD-10-CM | POA: Diagnosis not present

## 2016-11-16 DIAGNOSIS — J449 Chronic obstructive pulmonary disease, unspecified: Secondary | ICD-10-CM | POA: Diagnosis not present

## 2016-11-16 DIAGNOSIS — I359 Nonrheumatic aortic valve disorder, unspecified: Secondary | ICD-10-CM | POA: Diagnosis not present

## 2016-11-16 DIAGNOSIS — E1142 Type 2 diabetes mellitus with diabetic polyneuropathy: Secondary | ICD-10-CM | POA: Diagnosis not present

## 2016-11-16 DIAGNOSIS — N3281 Overactive bladder: Secondary | ICD-10-CM | POA: Diagnosis not present

## 2016-11-16 DIAGNOSIS — F3130 Bipolar disorder, current episode depressed, mild or moderate severity, unspecified: Secondary | ICD-10-CM | POA: Diagnosis not present

## 2016-11-16 DIAGNOSIS — N183 Chronic kidney disease, stage 3 (moderate): Secondary | ICD-10-CM | POA: Diagnosis not present

## 2016-11-16 DIAGNOSIS — Z299 Encounter for prophylactic measures, unspecified: Secondary | ICD-10-CM | POA: Diagnosis not present

## 2016-11-17 DIAGNOSIS — I110 Hypertensive heart disease with heart failure: Secondary | ICD-10-CM | POA: Diagnosis not present

## 2016-11-17 DIAGNOSIS — F319 Bipolar disorder, unspecified: Secondary | ICD-10-CM | POA: Diagnosis not present

## 2016-11-17 DIAGNOSIS — E119 Type 2 diabetes mellitus without complications: Secondary | ICD-10-CM | POA: Diagnosis not present

## 2016-11-17 DIAGNOSIS — I5030 Unspecified diastolic (congestive) heart failure: Secondary | ICD-10-CM | POA: Diagnosis not present

## 2016-11-17 DIAGNOSIS — F419 Anxiety disorder, unspecified: Secondary | ICD-10-CM | POA: Diagnosis not present

## 2016-11-17 DIAGNOSIS — J441 Chronic obstructive pulmonary disease with (acute) exacerbation: Secondary | ICD-10-CM | POA: Diagnosis not present

## 2016-11-18 DIAGNOSIS — I5030 Unspecified diastolic (congestive) heart failure: Secondary | ICD-10-CM | POA: Diagnosis not present

## 2016-11-18 DIAGNOSIS — F319 Bipolar disorder, unspecified: Secondary | ICD-10-CM | POA: Diagnosis not present

## 2016-11-18 DIAGNOSIS — J441 Chronic obstructive pulmonary disease with (acute) exacerbation: Secondary | ICD-10-CM | POA: Diagnosis not present

## 2016-11-18 DIAGNOSIS — F419 Anxiety disorder, unspecified: Secondary | ICD-10-CM | POA: Diagnosis not present

## 2016-11-18 DIAGNOSIS — I110 Hypertensive heart disease with heart failure: Secondary | ICD-10-CM | POA: Diagnosis not present

## 2016-11-18 DIAGNOSIS — E119 Type 2 diabetes mellitus without complications: Secondary | ICD-10-CM | POA: Diagnosis not present

## 2016-11-22 DIAGNOSIS — F419 Anxiety disorder, unspecified: Secondary | ICD-10-CM | POA: Diagnosis not present

## 2016-11-22 DIAGNOSIS — I110 Hypertensive heart disease with heart failure: Secondary | ICD-10-CM | POA: Diagnosis not present

## 2016-11-22 DIAGNOSIS — J441 Chronic obstructive pulmonary disease with (acute) exacerbation: Secondary | ICD-10-CM | POA: Diagnosis not present

## 2016-11-22 DIAGNOSIS — I5030 Unspecified diastolic (congestive) heart failure: Secondary | ICD-10-CM | POA: Diagnosis not present

## 2016-11-22 DIAGNOSIS — F319 Bipolar disorder, unspecified: Secondary | ICD-10-CM | POA: Diagnosis not present

## 2016-11-22 DIAGNOSIS — E119 Type 2 diabetes mellitus without complications: Secondary | ICD-10-CM | POA: Diagnosis not present

## 2016-11-30 NOTE — Progress Notes (Signed)
Psychiatric Initial Adult Assessment   Patient Identification: Brenda Mcdonald MRN:  [id number] Date of Evaluation:  12/02/2016 Referral Source: Dr. Doreen Beam Chief Complaint:   Chief Complaint    Depression; New Evaluation     Visit Diagnosis:    ICD-9-CM ICD-10-CM   1. Bipolar affective disorder, currently depressed, moderate (HCC) 296.52 F31.32     History of Present Illness:   Brenda Mcdonald is a 76 year old female, history of bipolar disorder, dementia per chart, essential hypertension, CAD, COPD, TIA, type II DM, who is referred for depression.   She states that she is here to transfer care to here from [facility]. She reports that she suffers from bipolar disorder and feels depressed since young. She also feels anxious and tends to ruminate on things. She feels that it got worse over the past few months, although she is not aware of any triggers. She talks about her husband of more than 30 years, who recently suffered from stroke (twice, last in a few months ago). She reports good relationship. She has nothing to look forward to and tends to stay in the house. She also reports that she feels embarrassed that she has hearing impairment. Her daughter and niece visits her every day and takes care of her medication.    She feels depressed. She reports insomnia with night time awakening. She endorses poor appetite and low energy. Although she reports anhedonia, she enjoys reading at times. She has occasional passive SI. She denies AH/VH. She reports history of decreased need for sleep many years ago (details unknown). Although she may have a period of euphoria in the past, she is unable to elaborate it. She occasionally has panic attack (unable to elaborate the frequency). She takes Xanax once to three times a day with some benefit. She denies alcohol use or drug use.   TSH 1.110 02/2016  Current psych medication- lamotrigine 200 mg qhs, memantine 14 mg daily, quetiapine 25 mg qhs, buspirone 10  mg TID, Xanax 0.25 mg qhs  Per [facility] database:  11/08/2016 ALPRAZOLAM 0.25 MG TABLET. 30 tabs for 30 days with 2 refills,  11/04/2016 HYDROCODONE- ACETAMIN 5- 325 MG for 30 days, no refills  Associated Signs/Symptoms: Depression Symptoms:  depressed mood, insomnia, recurrent thoughts of death, anxiety, panic attacks, (Hypo) Manic Symptoms:  denies recent episode Anxiety Symptoms:  Excessive Worry, Panic Symptoms, Psychotic Symptoms:  denies PTSD Symptoms: NA  Past Psychiatric History:  Outpatient: Used to see Dr. Elpidio Anis, who retired 6 months ago Psychiatry admission: "several times," last in 2017 for depression and hallucinations, [facility] she was a teenager. She states that she was started on Thorazine when she was 76 years old Previous suicide attempt: denies Past trials of medication: sertraline, fluoxetine, lithium (used to be on for 24 years, had "parkinson"),   History of violence: denies  Previous Psychotropic Medications: Yes   Substance Abuse History in the last 12 months:  No.  Consequences of Substance Abuse: NA  Past Medical History:  Past Medical History:  Diagnosis Date  . Anxiety   . Arthritis   . Bipolar disorder (HCC)   . COPD (chronic obstructive pulmonary disease) (HCC)   . Coronary atherosclerosis of native coronary artery    Nonobstructive  . Depression   . Diabetes mellitus, type 2 (HCC)   . Dysphagia   . Essential hypertension, benign   . GERD (gastroesophageal reflux disease)   . Pneumonia   .  Pulmonary nodule July 2009   Nonspecific 5 mm right middle lobe  . TIA (transient ischemic attack)     Past Surgical History:  Procedure Laterality Date  . ABDOMINAL HYSTERECTOMY    . APPENDECTOMY    . CARDIAC CATHETERIZATION  11/2010  . CATARACT EXTRACTION, BILATERAL Bilateral   . CHOLECYSTECTOMY    . DILATION AND CURETTAGE OF UTERUS    . INCISION AND DRAINAGE Left 03/12/2014   Procedure: LEFT  INCISION/DRAINAGE DEEP ABSCESS/BURSA/HEMATOMA THIGH/KNEE REGION;  Surgeon: [name], MD;  Location: [facility] OR;  Service: Orthopedics;  Laterality: Left;  . KNEE ARTHROSCOPY Left   . ORIF DISTAL FEMUR FRACTURE Left 01/24/2014  . ORIF FEMUR FRACTURE Left 01/24/2014   Procedure: OPEN REDUCTION INTERNAL FIXATION (ORIF) DISTAL FEMUR FRACTURE;  Surgeon: [name], MD;  Location: [facility] OR;  Service: Orthopedics;  Laterality: Left;  . TIBIA FRACTURE SURGERY Left ~ 1959  . TOTAL KNEE ARTHROPLASTY Left 2000  . VESICOVAGINAL FISTULA CLOSURE W/ TAH      Family Psychiatric History:  denies  Family History:  Family History  Problem Relation Age of Onset  . Coronary artery disease Unknown     Social History:   Social History   Social History  . Marital status: Married    Spouse name: N/A  . Number of children: N/A  . Years of education: N/A   Social History Main Topics  . Smoking status: Former Smoker    Packs/day: 0.50    Years: 58.00    Types: Cigarettes    Quit date: 07/26/2010  . Smokeless tobacco: Never Used  . Alcohol use No  . Drug use: No  . Sexual activity: No   Other Topics Concern  . None   Social History Narrative  . None    Additional Social History:  Born and grew up in [place], she reports "bad childhood", her mother died at age 25 Work: used to work at nursing home,  She is married twice, has four children  Lives with her husband of 30 years  Allergies:   Allergies  Allergen Reactions  . Azithromycin     Per MAR  . Penicillins     Tolerated Rocephin (July 2015)  . Tetracycline     Per MAR    Metabolic Disorder Labs: No results found for: HGBA1C, MPG No results found for: PROLACTIN Lab Results  Component Value Date   CHOL  09/06/2008    164        ATP III CLASSIFICATION:  <200     mg/dL   Desirable  725-366  mg/dL   Borderline High  >=440    mg/dL   High          TRIG 86 09/06/2008   HDL 49 09/06/2008   CHOLHDL 3.3 09/06/2008   VLDL 17  09/06/2008   LDLCALC  09/06/2008    98        Total Cholesterol/HDL:CHD Risk Coronary Heart Disease Risk Table                     Men   Women  1/2 Average Risk   3.4   3.3  Average Risk       5.0   4.4  2 X Average Risk   9.6   7.1  3 X Average Risk  23.4   11.0        Use the calculated Patient Ratio above and the CHD Risk Table to determine the patient's CHD  Risk.        ATP III CLASSIFICATION (LDL):  <100     mg/dL   Optimal  161-096  mg/dL   Near or Above                    Optimal  130-159  mg/dL   Borderline  045-409  mg/dL   High  >811     mg/dL   Very High     Current Medications: Current Outpatient Prescriptions  Medication Sig Dispense Refill  . albuterol (PROVENTIL HFA;VENTOLIN HFA) 108 (90 BASE) MCG/ACT inhaler Inhale 2 puffs into the lungs every 6 (six) hours as needed for wheezing or shortness of breath.    Marland Kitchen amLODipine (NORVASC) 10 MG tablet Take 10 mg by mouth daily.    Marland Kitchen atorvastatin (LIPITOR) 10 MG tablet TAKE 1 TABLET BY MOUTH AT BEDTIME. 30 tablet 5  . busPIRone (BUSPAR) 10 MG tablet Take 1 tablet (10 mg total) by mouth 3 (three) times daily. 90 tablet 1  . furosemide (LASIX) 40 MG tablet Take by mouth. Taking 1.5 tabs QD    . GLIPIZIDE PO Take 2 mg by mouth daily.    Marland Kitchen HYDROcodone-acetaminophen (NORCO/VICODIN) 5-325 MG per tablet Take 1 tablet by mouth every 4 (four) hours as needed for moderate pain. 30 tablet 0  . ipratropium-albuterol (DUONEB) 0.5-2.5 (3) MG/3ML SOLN Take 3 mLs by nebulization 4 (four) times daily as needed. Shortness of breath    . lamoTRIgine (LAMICTAL) 150 MG tablet Take 1 tablet (150 mg total) by mouth at bedtime. 30 tablet 1  . memantine (NAMENDA XR) 14 MG CP24 24 hr capsule Take 14 mg by mouth daily.    . metoprolol tartrate (LOPRESSOR) 25 MG tablet TAKE (1/2) TABLET BY MOUTH 2 TIMES A DAY. 30 tablet 0  . metoprolol tartrate (LOPRESSOR) 25 MG tablet TAKE (1/2) TABLET BY MOUTH TWICE DAILY. 30 tablet 0  . omeprazole (PRILOSEC) 40  MG capsule Take 20 mg by mouth daily.     . QUEtiapine (SEROQUEL) 25 MG tablet Take 1 tablet (25 mg total) by mouth at bedtime. 30 tablet 1  . solifenacin (VESICARE) 10 MG tablet Take 10 mg by mouth daily.      Marland Kitchen LORazepam (ATIVAN) 0.5 MG tablet Take 1 tablet (0.5 mg total) by mouth daily as needed for anxiety. 30 tablet 0  . mirtazapine (REMERON) 7.5 MG tablet Take 1 tablet (7.5 mg total) by mouth at bedtime. 30 tablet 1   No current facility-administered medications for this visit.     Neurologic: Headache: No Seizure: No Paresthesias:No  Musculoskeletal: Strength & Muscle Tone: decreased Gait & Station: in a wheelchair Patient leans: N/A  Psychiatric Specialty Exam: Review of Systems  Psychiatric/Behavioral: Positive for depression and suicidal ideas. Negative for hallucinations and substance abuse. The patient is nervous/anxious and has insomnia.   All other systems reviewed and are negative.   Blood pressure 136/74, pulse 60, height 5\' 4"  (1.626 m).There is no height or weight on file to calculate BMI.  General Appearance: Fairly Groomed  Eye Contact:  Good  Speech:  Clear and Coherent  Volume:  Normal  Mood:  Depressed  Affect:  slightly restricted  Thought Process:  Coherent and Goal Directed  Orientation:  Full (Time, Place, and Person)  Thought Content:  Logical Perceptions: denies AH/VH  Suicidal Thoughts:  Yes.  without intent/plan  Homicidal Thoughts:  No  Memory:  Immediate;   Good Recent;   Good Remote;  Good  Judgement:  Fair  Insight:  Fair  Psychomotor Activity:  Normal  Concentration:  Concentration: Good and Attention Span: Good  Recall:  Good  Fund of Knowledge:Good  Language: Good  Akathisia:  No  Handed:  Right  AIMS (if indicated):  N/A  Assets:  Communication Skills Desire for Improvement  ADL's:  Intact  Cognition: WNL  Sleep:  poor  Delayed recall 3/3, clock drawing (1/3 -misplaced numbers, wrote "1110" when asked to place clock hands),  unable to accurately copy a cube  On 12/02/2016  Assessment BRUNETTA NEWINGHAM is a 76 year old female, history of bipolar disorder, unspecified neurocognitive disorder, essential hypertension, CAD, COPD, TIA, type II DM, who is referred for depression.   # Bipolar disorder, unspecified  # r/o MDD Patient endorses worsening neurovegetative symptoms and anxiety, while her mood reactivity appears to be well preserved. Psychosocial stressors including demoralization due to her hearing loss and her husband who suffered a stroke. Although it is difficult to get history of mania, patient was diagnosed with bipolar disorder and had psychiatry admission since teenager. Will obtain record from [facility]. Will start mirtazapine to target her mood, appetite and insomnia. Will plan to taper off lamotrigine slowly to avoid polypharmacy/given limited benefit per patient report. Will continue quetiapine for mood stabilization. Will continue buspirone for anxiety at this time; this medication may be tapered off in the future. Will switch from Xanax to ativan to avoid risk of dependence and she would benefit from longer half life. Discussed risk of oversedation and fall. Discussed in length regarding behavioral activation; she is encouraged to contact senior center for daytime activities.   # Unspecified neurocognitive disorder Exam is notable for her impairment in executive functioning while her delayed recall was intact on exam. Patient was started on memantine by her PCP and she denies any side effect. Will continue medication at this time. Will consider obtaining MOCA to monitor her impairment. Will also consider obtaining collateral (although patient prefers to be interviewed privately).   Plan 1. Start Mirtazapine 7.5 mg at night  2. Decrease lamotrigine 150 mg daily 3. Continue quetiapine 25 mg at night 4. Continue buspirone 10 mg three times per day 5. Discontinue Xanax  6. Start Ativan 0.5 mg daily as needed for  anxiety 7. Consider going to activities at [facility] [address] [place], [place] [address]  Contact  Carla Grandin , [phone] 8. Return to clinic in one month for 30 mins 9. Obtain record from [facility]  Treatment Plan Summary: Plan as above  The patient demonstrates the following risk factors for suicide: Chronic risk factors for suicide include: psychiatric disorder of bipolar disorder. Acute risk factors for suicide include: unemployment and social withdrawal/isolation. Protective factors for this patient include: positive social support, coping skills. Considering these factors, the overall suicide risk at this point appears to be low. Patient is appropriate for outpatient follow up.    Neysa Hotter, MD 5/10/20181:00 PM

## 2016-12-02 ENCOUNTER — Encounter (HOSPITAL_COMMUNITY): Payer: Self-pay | Admitting: Psychiatry

## 2016-12-02 ENCOUNTER — Ambulatory Visit (INDEPENDENT_AMBULATORY_CARE_PROVIDER_SITE_OTHER): Payer: Medicare Other | Admitting: Psychiatry

## 2016-12-02 VITALS — BP 136/74 | HR 60 | Ht 64.0 in

## 2016-12-02 DIAGNOSIS — I5030 Unspecified diastolic (congestive) heart failure: Secondary | ICD-10-CM | POA: Diagnosis not present

## 2016-12-02 DIAGNOSIS — E119 Type 2 diabetes mellitus without complications: Secondary | ICD-10-CM

## 2016-12-02 DIAGNOSIS — Z87891 Personal history of nicotine dependence: Secondary | ICD-10-CM | POA: Diagnosis not present

## 2016-12-02 DIAGNOSIS — J449 Chronic obstructive pulmonary disease, unspecified: Secondary | ICD-10-CM

## 2016-12-02 DIAGNOSIS — I2510 Atherosclerotic heart disease of native coronary artery without angina pectoris: Secondary | ICD-10-CM

## 2016-12-02 DIAGNOSIS — Z79899 Other long term (current) drug therapy: Secondary | ICD-10-CM

## 2016-12-02 DIAGNOSIS — R45851 Suicidal ideations: Secondary | ICD-10-CM

## 2016-12-02 DIAGNOSIS — R419 Unspecified symptoms and signs involving cognitive functions and awareness: Secondary | ICD-10-CM | POA: Diagnosis not present

## 2016-12-02 DIAGNOSIS — F419 Anxiety disorder, unspecified: Secondary | ICD-10-CM | POA: Diagnosis not present

## 2016-12-02 DIAGNOSIS — F3132 Bipolar disorder, current episode depressed, moderate: Secondary | ICD-10-CM | POA: Diagnosis not present

## 2016-12-02 DIAGNOSIS — J441 Chronic obstructive pulmonary disease with (acute) exacerbation: Secondary | ICD-10-CM | POA: Diagnosis not present

## 2016-12-02 DIAGNOSIS — F319 Bipolar disorder, unspecified: Secondary | ICD-10-CM | POA: Diagnosis not present

## 2016-12-02 DIAGNOSIS — F331 Major depressive disorder, recurrent, moderate: Secondary | ICD-10-CM | POA: Insufficient documentation

## 2016-12-02 DIAGNOSIS — I110 Hypertensive heart disease with heart failure: Secondary | ICD-10-CM | POA: Diagnosis not present

## 2016-12-02 DIAGNOSIS — I10 Essential (primary) hypertension: Secondary | ICD-10-CM | POA: Diagnosis not present

## 2016-12-02 MED ORDER — BUSPIRONE HCL 10 MG PO TABS: 10.0000 mg | ORAL_TABLET | Freq: Three times a day (TID) | ORAL | 1 refills | Status: DC

## 2016-12-02 MED ORDER — LAMOTRIGINE 150 MG PO TABS: 150.0000 mg | ORAL_TABLET | Freq: Every day | ORAL | 1 refills | Status: DC

## 2016-12-02 MED ORDER — MIRTAZAPINE 7.5 MG PO TABS: 7.5000 mg | ORAL_TABLET | Freq: Every day | ORAL | 1 refills | Status: DC

## 2016-12-02 MED ORDER — LORAZEPAM 0.5 MG PO TABS: 0.5000 mg | ORAL_TABLET | Freq: Every day | ORAL | 0 refills | Status: DC | PRN

## 2016-12-02 MED ORDER — QUETIAPINE FUMARATE 25 MG PO TABS: 25.0000 mg | ORAL_TABLET | Freq: Every day | ORAL | 1 refills | Status: DC

## 2016-12-02 NOTE — Patient Instructions (Addendum)
1. Start Mirtazapine 7.5 mg at night  2. Decrease lamotrigine 150 mg daily 3. Continue quetiapine 25 mg at night 4. Continue buspirone 10 mg three times per day 5. Discontinue Xanax  6. Start Ativan 0.5 mg daily as needed for anxiety 7. Consider going to activities at [facility] [address], [place], [place] [address]  Contact  Carla Limestone , [phone] 8. Return to clinic in one month for 30 mins

## 2016-12-06 ENCOUNTER — Other Ambulatory Visit: Payer: Self-pay | Admitting: Cardiology

## 2016-12-09 ENCOUNTER — Telehealth (HOSPITAL_COMMUNITY): Payer: Self-pay | Admitting: *Deleted

## 2016-12-09 ENCOUNTER — Other Ambulatory Visit (HOSPITAL_COMMUNITY): Payer: Self-pay | Admitting: Psychiatry

## 2016-12-09 MED ORDER — MIRTAZAPINE 15 MG PO TABS: 15.0000 mg | ORAL_TABLET | Freq: Every day | ORAL | 1 refills | Status: DC

## 2016-12-09 NOTE — Telephone Encounter (Signed)
phone call from [facility], patient's neice.   The patient is with her and she said the patient is very nervous, anxious, feels like she is shaking on the inside, not content with anything.   She said she needs something to help her feel better.

## 2016-12-09 NOTE — Telephone Encounter (Signed)
Done. Discussed with patient. Please obtain consent form to talk with Grace Isaac, patient's niece when the patient comes to the clinic.

## 2016-12-09 NOTE — Telephone Encounter (Signed)
Discussed with patient. She feels more nervous since the last appointment. She has been taking ativan 0.5 mg and it is less likely that she has withdrawal symptoms from Xanax (used to be on 0.25 mg). Will increase mirtazapine 15 mg qhs.

## 2016-12-14 DIAGNOSIS — E119 Type 2 diabetes mellitus without complications: Secondary | ICD-10-CM | POA: Diagnosis not present

## 2016-12-14 DIAGNOSIS — F319 Bipolar disorder, unspecified: Secondary | ICD-10-CM | POA: Diagnosis not present

## 2016-12-14 DIAGNOSIS — J441 Chronic obstructive pulmonary disease with (acute) exacerbation: Secondary | ICD-10-CM | POA: Diagnosis not present

## 2016-12-14 DIAGNOSIS — F419 Anxiety disorder, unspecified: Secondary | ICD-10-CM | POA: Diagnosis not present

## 2016-12-14 DIAGNOSIS — I110 Hypertensive heart disease with heart failure: Secondary | ICD-10-CM | POA: Diagnosis not present

## 2016-12-14 DIAGNOSIS — I5030 Unspecified diastolic (congestive) heart failure: Secondary | ICD-10-CM | POA: Diagnosis not present

## 2016-12-15 ENCOUNTER — Telehealth (HOSPITAL_COMMUNITY): Payer: Self-pay | Admitting: Psychiatry

## 2016-12-15 NOTE — Telephone Encounter (Signed)
Obtained record from [facility].  She was diagnosed with unspecified depressive disorder, unspecified anxiety disorder, unspecified bipolar related disorder by history. She was treated through 08/2016. Per chart, no significant mood symptoms nor psychotic symptoms were documented. She was continued on buspiron 10 mg TID, lamotrigine 200 mg qhs, quetiapine 25 mg qhs, Xanax 0.25 mg qhs.

## 2016-12-15 NOTE — Telephone Encounter (Signed)
noted 

## 2016-12-21 DIAGNOSIS — F319 Bipolar disorder, unspecified: Secondary | ICD-10-CM | POA: Diagnosis not present

## 2016-12-21 DIAGNOSIS — E1122 Type 2 diabetes mellitus with diabetic chronic kidney disease: Secondary | ICD-10-CM | POA: Diagnosis not present

## 2016-12-21 DIAGNOSIS — J449 Chronic obstructive pulmonary disease, unspecified: Secondary | ICD-10-CM | POA: Diagnosis not present

## 2016-12-21 DIAGNOSIS — E1165 Type 2 diabetes mellitus with hyperglycemia: Secondary | ICD-10-CM | POA: Diagnosis not present

## 2016-12-21 DIAGNOSIS — F329 Major depressive disorder, single episode, unspecified: Secondary | ICD-10-CM | POA: Diagnosis not present

## 2016-12-21 DIAGNOSIS — I10 Essential (primary) hypertension: Secondary | ICD-10-CM | POA: Diagnosis not present

## 2016-12-21 DIAGNOSIS — N183 Chronic kidney disease, stage 3 (moderate): Secondary | ICD-10-CM | POA: Diagnosis not present

## 2016-12-21 DIAGNOSIS — E1142 Type 2 diabetes mellitus with diabetic polyneuropathy: Secondary | ICD-10-CM | POA: Diagnosis not present

## 2016-12-21 DIAGNOSIS — I2510 Atherosclerotic heart disease of native coronary artery without angina pectoris: Secondary | ICD-10-CM | POA: Diagnosis not present

## 2016-12-21 DIAGNOSIS — Z299 Encounter for prophylactic measures, unspecified: Secondary | ICD-10-CM | POA: Diagnosis not present

## 2016-12-21 DIAGNOSIS — E785 Hyperlipidemia, unspecified: Secondary | ICD-10-CM | POA: Diagnosis not present

## 2016-12-21 DIAGNOSIS — Z6833 Body mass index (BMI) 33.0-33.9, adult: Secondary | ICD-10-CM | POA: Diagnosis not present

## 2016-12-24 NOTE — Progress Notes (Signed)
[facility] MD/PA/NP OP Progress Note  12/28/2016 12:41 PM Brenda Mcdonald  MRN:  [id number]  Chief Complaint:  Chief Complaint    Depression; Follow-up     Subjective: "I feel anxious" HPI:  - Since the last appointment, Mirtazapine was advised to uptitrate to 15 mg qhs due to her worsening anxiety and limited effect from ativan.   Patient states that she continues to feel anxious. She has no benefit from Ativan. She does not recollect the conversation regarding mirtazapine, but reports she takes medication sent by a pharmacy. She believes her anxiety got worse and could not enjoy reading as she used to. She then asks if there is any rehab place. Although she is not able to elaborate it, she states that she was recommended by her PCP. She has not tried senior center. She has a home health and her niece visits her five days per week. She feels frustrated that she cannot walk. She states that her husband is doing better, although she does not tell much about it. She feels depressed and endorses anhedonia. She reports poor appetite. She reports improvement in her insomnia. She denies SI. She is unable to tell the frequency of her panic attacks. She states that she has not taken any Xanax. She does not cook nor paying bills.   Per [facility] database 12/07/2016 ALPRAZOLAM 0.25 MG TABLET, 28 tabs with one refill remaining,  LAY Tawni Pummel III MD 12/02/2016 LORAZEPAM 0.5 MG TABLET, 17 tabs with no refill Manley Fason  Wt Readings from Last 3 Encounters:  12/28/16 195 lb (88.5 kg)  08/04/16 200 lb (90.7 kg)  03/11/16 215 lb (97.5 kg)    Visit Diagnosis:    ICD-9-CM ICD-10-CM   1. Bipolar affective disorder, currently depressed, moderate (HCC) 296.52 F31.32     Past Psychiatric History:  Outpatient: Used to see Dr. Elpidio Anis, who retired 6 months ago Per record from [facility], she was diagnosed with unspecified depressive disorder, unspecified anxiety disorder, unspecified bipolar related disorder by history. She  was treated through 08/2016. Per chart, no significant mood symptoms nor psychotic symptoms were documented. She was continued on buspiron 10 mg TID, lamotrigine 200 mg qhs, quetiapine 25 mg qhs, Xanax 0.25 mg qhs.  Psychiatry admission: "several times," last in 2017 for depression and hallucinations, [facility] she was a teenager. She states that she was started on Thorazine when she was 76 years old Previous suicide attempt: denies Past trials of medication: sertraline, fluoxetine, lithium (used to be on for 24 years, had "parkinson"),   History of violence: denies  Past Medical History:  Past Medical History:  Diagnosis Date  . Anxiety   . Arthritis   . Bipolar disorder (HCC)   . COPD (chronic obstructive pulmonary disease) (HCC)   . Coronary atherosclerosis of native coronary artery    Nonobstructive  . Depression   . Diabetes mellitus, type 2 (HCC)   . Dysphagia   . Essential hypertension, benign   . GERD (gastroesophageal reflux disease)   . Pneumonia   . Pulmonary nodule July 2009   Nonspecific 5 mm right middle lobe  . TIA (transient ischemic attack)     Past Surgical History:  Procedure Laterality Date  . ABDOMINAL HYSTERECTOMY    . APPENDECTOMY    . CARDIAC CATHETERIZATION  11/2010  . CATARACT EXTRACTION, BILATERAL Bilateral   . CHOLECYSTECTOMY    . DILATION AND CURETTAGE OF UTERUS    . INCISION AND  DRAINAGE Left 03/12/2014   Procedure: LEFT INCISION/DRAINAGE DEEP ABSCESS/BURSA/HEMATOMA THIGH/KNEE REGION;  Surgeon: [name], MD;  Location: [facility] OR;  Service: Orthopedics;  Laterality: Left;  . KNEE ARTHROSCOPY Left   . ORIF DISTAL FEMUR FRACTURE Left 01/24/2014  . ORIF FEMUR FRACTURE Left 01/24/2014   Procedure: OPEN REDUCTION INTERNAL FIXATION (ORIF) DISTAL FEMUR FRACTURE;  Surgeon: [name], MD;  Location: [facility] OR;  Service: Orthopedics;  Laterality: Left;  . TIBIA FRACTURE SURGERY Left ~ 1959  . TOTAL KNEE  ARTHROPLASTY Left 2000  . VESICOVAGINAL FISTULA CLOSURE W/ TAH      Family Psychiatric History: denies  Family History:  Family History  Problem Relation Age of Onset  . Coronary artery disease Unknown     Social History:  Social History   Social History  . Marital status: Married    Spouse name: N/A  . Number of children: N/A  . Years of education: N/A   Social History Main Topics  . Smoking status: Former Smoker    Packs/day: 0.50    Years: 58.00    Types: Cigarettes    Quit date: 07/26/2010  . Smokeless tobacco: Never Used  . Alcohol use No  . Drug use: No  . Sexual activity: No   Other Topics Concern  . None   Social History Narrative  . None    Allergies:  Allergies  Allergen Reactions  . Azithromycin     Per MAR  . Penicillins     Tolerated Rocephin (July 2015)  . Tetracycline     Per MAR    Metabolic Disorder Labs: No results found for: HGBA1C, MPG No results found for: PROLACTIN Lab Results  Component Value Date   CHOL  09/06/2008    164        ATP III CLASSIFICATION:  <200     mg/dL   Desirable  254-270  mg/dL   Borderline High  >=623    mg/dL   High          TRIG 86 09/06/2008   HDL 49 09/06/2008   CHOLHDL 3.3 09/06/2008   VLDL 17 09/06/2008   LDLCALC  09/06/2008    98        Total Cholesterol/HDL:CHD Risk Coronary Heart Disease Risk Table                     Men   Women  1/2 Average Risk   3.4   3.3  Average Risk       5.0   4.4  2 X Average Risk   9.6   7.1  3 X Average Risk  23.4   11.0        Use the calculated Patient Ratio above and the CHD Risk Table to determine the patient's CHD Risk.        ATP III CLASSIFICATION (LDL):  <100     mg/dL   Optimal  762-831  mg/dL   Near or Above                    Optimal  130-159  mg/dL   Borderline  517-616  mg/dL   High  >073     mg/dL   Very High     Current Medications: Current Outpatient Prescriptions  Medication Sig Dispense Refill  . albuterol (PROVENTIL HFA;VENTOLIN  HFA) 108 (90 BASE) MCG/ACT inhaler Inhale 2 puffs into the lungs every 6 (six) hours as needed for wheezing or shortness of  breath.    Marland Kitchen amLODipine (NORVASC) 10 MG tablet Take 10 mg by mouth daily.    Marland Kitchen atorvastatin (LIPITOR) 10 MG tablet TAKE 1 TABLET BY MOUTH AT BEDTIME. 30 tablet 5  . busPIRone (BUSPAR) 10 MG tablet 10 mg three times a day (scheduled) and 10 mg twice a day as needed for anxiety 150 tablet 1  . furosemide (LASIX) 40 MG tablet Take by mouth. Taking 1.5 tabs QD    . GLIPIZIDE PO Take 2 mg by mouth daily.    Marland Kitchen HYDROcodone-acetaminophen (NORCO/VICODIN) 5-325 MG per tablet Take 1 tablet by mouth every 4 (four) hours as needed for moderate pain. 30 tablet 0  . ipratropium-albuterol (DUONEB) 0.5-2.5 (3) MG/3ML SOLN Take 3 mLs by nebulization 4 (four) times daily as needed. Shortness of breath    . lamoTRIgine (LAMICTAL) 150 MG tablet Take 1 tablet (150 mg total) by mouth at bedtime. 30 tablet 1  . memantine (NAMENDA XR) 14 MG CP24 24 hr capsule Take 14 mg by mouth daily.    . metoprolol tartrate (LOPRESSOR) 25 MG tablet TAKE (1/2) TABLET BY MOUTH 2 TIMES A DAY. 30 tablet 0  . metoprolol tartrate (LOPRESSOR) 25 MG tablet TAKE (1/2) TABLET BY MOUTH TWICE DAILY. 30 tablet 2  . mirtazapine (REMERON) 15 MG tablet Take 1 tablet (15 mg total) by mouth at bedtime. 30 tablet 1  . omeprazole (PRILOSEC) 40 MG capsule Take 20 mg by mouth daily.     . QUEtiapine (SEROQUEL) 25 MG tablet Take 1 tablet (25 mg total) by mouth at bedtime. 30 tablet 1  . solifenacin (VESICARE) 10 MG tablet Take 10 mg by mouth daily.       No current facility-administered medications for this visit.     Neurologic: Headache: No Seizure: No Paresthesias: No  Musculoskeletal: Strength & Muscle Tone: decreased Gait & Station: in a wheel chair Patient leans: N/A  Psychiatric Specialty Exam: Review of Systems  Psychiatric/Behavioral: Positive for depression. Negative for hallucinations, substance abuse and  suicidal ideas. The patient is nervous/anxious. The patient does not have insomnia.   All other systems reviewed and are negative.   Blood pressure (!) 159/67, pulse 69, height 5\' 4"  (1.626 m), weight 195 lb (88.5 kg), SpO2 97 %.Body mass index is 33.47 kg/m.  General Appearance: Fairly Groomed in wheelchair  Eye Contact:  Good  Speech:  Clear and Coherent  Volume:  Normal  Mood:  Anxious and Depressed  Affect:  Restricted  Thought Process:  Coherent and Goal Directed  Orientation:  Full (Time, Place, and Person)  Thought Content: Logical Perceptions: denies AH/VH  Suicidal Thoughts:  No  Homicidal Thoughts:  No  Memory:  Immediate;   Good Recent;   Good Remote;   Good  Judgement:  Fair  Insight:  Shallow  Psychomotor Activity:  Normal  Concentration:  Concentration: Good and Attention Span: Good  Recall:  Good  Fund of Knowledge: Good  Language: Good  Akathisia:  No  Handed:  Right  AIMS (if indicated):  N/A  Assets:  Communication Skills Desire for Improvement  ADL's:  Intact  Cognition: WNL  Sleep:  fair   MOCA: 12/30 on 12/28/2016. (-4 for visuospatial, -1 for naming, -4 for attention, -2 for language, -1 for abstraction, -5 for delayed recall, -1 for orientation "6th")  Assessment Brenda Mcdonald is a 76 year old female, history of bipolar disorder, unspecified neurocognitive disorder, essential hypertension, CAD, COPD, TIA, type II DM, who is referred for depression.   #  Bipolar disorder, unspecified  # r/o MDD Patient endorses worsening anxiety since the last encounter. Psychosocial stressors including demoralization due to her hearing loss and her husband who suffered a stroke. Her neurocognitive impairment as described below may play some role in her mood symptoms as well. Will uptitrate mirtazapine to target her mood, appetite and insomnia. Will continue lamotrigine at this time for mood regulation, although will consider tapering it off in the future to avoid  polypharmacy. Unable to obtain detailed information regarding her manic episode neither from the patient nor previous record. Will consider obtaining collateral at the next visit. Will discontinue ativan given limited benefit; will have prn buspirone in addition to scheduled for anxiety. Discussed again regarding behavioral activation; she is encouraged to contact senior center for daytime activities.   # Unspecified neurocognitive disorder She has impairment in her IADL and exam is notable for her impairment in executive functioning as evidenced in MOCA. She is on memantine prescribed by her PCP and she denies any side effect.Will continue current medication.   Plan 1. Increase Mirtazapine 15 mg at night  2. Continue lamotrigine 150 mg daily 3. Continue quetiapine 25 mg at night 4. Continue buspirone 10 mg three times per day AND buspirone 10 mg twice a day as needed for anxiety 5. Discontinue Atiavn 6. Consider going to activities at [facility] [address], [place], [place] [address]  Contact  Carla Hickman , [phone] 7. Return to clinic in one month for 30 mins  Treatment Plan Summary: Plan as above  The patient demonstrates the following risk factors for suicide: Chronic risk factors for suicide include: psychiatric disorder of bipolar disorder. Acute risk factors for suicide include: unemployment and social withdrawal/isolation. Protective factors for this patient include: positive social support, coping skills. Considering these factors, the overall suicide risk at this point appears to be low. Patient is appropriate for outpatient follow up.  The duration of this appointment visit was 30 minutes of face-to-face time with the patient.  Greater than 50% of this time was spent in counseling, explanation of  diagnosis, planning of further management, and coordination of care.  Neysa Hotter, MD 12/28/2016, 12:41 PM

## 2016-12-28 ENCOUNTER — Encounter (HOSPITAL_COMMUNITY): Payer: Self-pay | Admitting: Psychiatry

## 2016-12-28 ENCOUNTER — Ambulatory Visit (INDEPENDENT_AMBULATORY_CARE_PROVIDER_SITE_OTHER): Payer: Medicare Other | Admitting: Psychiatry

## 2016-12-28 VITALS — BP 159/67 | HR 69 | Ht 64.0 in | Wt 195.0 lb

## 2016-12-28 DIAGNOSIS — R419 Unspecified symptoms and signs involving cognitive functions and awareness: Secondary | ICD-10-CM

## 2016-12-28 DIAGNOSIS — Z888 Allergy status to other drugs, medicaments and biological substances status: Secondary | ICD-10-CM

## 2016-12-28 DIAGNOSIS — Z79891 Long term (current) use of opiate analgesic: Secondary | ICD-10-CM

## 2016-12-28 DIAGNOSIS — Z87891 Personal history of nicotine dependence: Secondary | ICD-10-CM

## 2016-12-28 DIAGNOSIS — Z880 Allergy status to penicillin: Secondary | ICD-10-CM

## 2016-12-28 DIAGNOSIS — Z79899 Other long term (current) drug therapy: Secondary | ICD-10-CM

## 2016-12-28 DIAGNOSIS — Z881 Allergy status to other antibiotic agents status: Secondary | ICD-10-CM | POA: Diagnosis not present

## 2016-12-28 DIAGNOSIS — I10 Essential (primary) hypertension: Secondary | ICD-10-CM

## 2016-12-28 DIAGNOSIS — I2510 Atherosclerotic heart disease of native coronary artery without angina pectoris: Secondary | ICD-10-CM

## 2016-12-28 DIAGNOSIS — F3132 Bipolar disorder, current episode depressed, moderate: Secondary | ICD-10-CM

## 2016-12-28 DIAGNOSIS — E1121 Type 2 diabetes mellitus with diabetic nephropathy: Secondary | ICD-10-CM | POA: Diagnosis not present

## 2016-12-28 DIAGNOSIS — Z8673 Personal history of transient ischemic attack (TIA), and cerebral infarction without residual deficits: Secondary | ICD-10-CM | POA: Diagnosis not present

## 2016-12-28 DIAGNOSIS — F319 Bipolar disorder, unspecified: Secondary | ICD-10-CM | POA: Diagnosis not present

## 2016-12-28 MED ORDER — BUSPIRONE HCL 10 MG PO TABS: ORAL_TABLET | ORAL | 1 refills | Status: DC

## 2016-12-28 MED ORDER — BUSPIRONE HCL 10 MG PO TABS: 10.0000 mg | ORAL_TABLET | Freq: Three times a day (TID) | ORAL | 1 refills | Status: DC

## 2016-12-28 MED ORDER — LAMOTRIGINE 150 MG PO TABS: 150.0000 mg | ORAL_TABLET | Freq: Every day | ORAL | 1 refills | Status: DC

## 2016-12-28 MED ORDER — MIRTAZAPINE 15 MG PO TABS: 15.0000 mg | ORAL_TABLET | Freq: Every day | ORAL | 1 refills | Status: DC

## 2016-12-28 MED ORDER — QUETIAPINE FUMARATE 25 MG PO TABS: 25.0000 mg | ORAL_TABLET | Freq: Every day | ORAL | 1 refills | Status: DC

## 2016-12-28 NOTE — Patient Instructions (Addendum)
1. Increase Mirtazapine 15 mg at night  2. Continue lamotrigine 150 mg daily 3. Continue quetiapine 25 mg at night 4. Continue buspirone 10 mg three times per day AND buspirone 10 mg twice a day as needed for anxiety 5. Discontinue Atiavn 6. Consider going to activities at [facility] [address] [place], [place] [address]  Contact  Carla [place] , [phone] 7. Return to clinic in one month for 30 mins

## 2016-12-29 DIAGNOSIS — I5030 Unspecified diastolic (congestive) heart failure: Secondary | ICD-10-CM | POA: Diagnosis not present

## 2016-12-29 DIAGNOSIS — I110 Hypertensive heart disease with heart failure: Secondary | ICD-10-CM | POA: Diagnosis not present

## 2016-12-29 DIAGNOSIS — J441 Chronic obstructive pulmonary disease with (acute) exacerbation: Secondary | ICD-10-CM | POA: Diagnosis not present

## 2016-12-29 DIAGNOSIS — F319 Bipolar disorder, unspecified: Secondary | ICD-10-CM | POA: Diagnosis not present

## 2016-12-29 DIAGNOSIS — F419 Anxiety disorder, unspecified: Secondary | ICD-10-CM | POA: Diagnosis not present

## 2016-12-29 DIAGNOSIS — E119 Type 2 diabetes mellitus without complications: Secondary | ICD-10-CM | POA: Diagnosis not present

## 2017-01-10 ENCOUNTER — Telehealth (HOSPITAL_COMMUNITY): Payer: Self-pay | Admitting: *Deleted

## 2017-01-10 NOTE — Telephone Encounter (Signed)
noted 

## 2017-01-10 NOTE — Telephone Encounter (Signed)
call transferred to [facility]

## 2017-01-10 NOTE — Telephone Encounter (Signed)
Pt called stating her Buspar is not working. Per pt she is taking 2-3 daily and she is nervious, jumping out of her skin and just can not see where it's working. In the back ground, pt niece Sharen Counter was informing pt that she do not take 3 tablets daily, she is taking 5 daily. Pt then put staff on hold while she and her niece went back and forth as to how many tablets she takes on a daily. When pt came back on the phone, staff informed pt to come into office for a sooner appt with provider to discuss her medication that is not working. Staff also informed pt to please bring her niece with her to her appt due to them not agreeing on how many tablets she takes of her Buspar and pt agreed and stated okay. Pt is scheduled to f/u with provider 01-11-2017.

## 2017-01-11 ENCOUNTER — Encounter (HOSPITAL_COMMUNITY): Payer: Self-pay | Admitting: Psychiatry

## 2017-01-11 ENCOUNTER — Ambulatory Visit (INDEPENDENT_AMBULATORY_CARE_PROVIDER_SITE_OTHER): Payer: Medicare Other | Admitting: Psychiatry

## 2017-01-11 VITALS — BP 138/76 | HR 68 | Ht 64.0 in | Wt 197.0 lb

## 2017-01-11 DIAGNOSIS — Z87891 Personal history of nicotine dependence: Secondary | ICD-10-CM

## 2017-01-11 DIAGNOSIS — I10 Essential (primary) hypertension: Secondary | ICD-10-CM | POA: Diagnosis not present

## 2017-01-11 DIAGNOSIS — F39 Unspecified mood [affective] disorder: Secondary | ICD-10-CM | POA: Diagnosis not present

## 2017-01-11 DIAGNOSIS — E118 Type 2 diabetes mellitus with unspecified complications: Secondary | ICD-10-CM | POA: Diagnosis not present

## 2017-01-11 DIAGNOSIS — F411 Generalized anxiety disorder: Secondary | ICD-10-CM

## 2017-01-11 DIAGNOSIS — J449 Chronic obstructive pulmonary disease, unspecified: Secondary | ICD-10-CM

## 2017-01-11 MED ORDER — BUSPIRONE HCL 10 MG PO TABS: 10.0000 mg | ORAL_TABLET | Freq: Three times a day (TID) | ORAL | 1 refills | Status: DC | PRN

## 2017-01-11 MED ORDER — LAMOTRIGINE 100 MG PO TABS: 100.0000 mg | ORAL_TABLET | Freq: Every day | ORAL | 1 refills | Status: DC

## 2017-01-11 MED ORDER — MIRTAZAPINE 15 MG PO TABS: 15.0000 mg | ORAL_TABLET | Freq: Every day | ORAL | 1 refills | Status: DC

## 2017-01-11 MED ORDER — QUETIAPINE FUMARATE 25 MG PO TABS: 25.0000 mg | ORAL_TABLET | Freq: Every day | ORAL | 1 refills | Status: DC

## 2017-01-11 MED ORDER — ESCITALOPRAM OXALATE 10 MG PO TABS: 10.0000 mg | ORAL_TABLET | Freq: Every day | ORAL | 1 refills | Status: DC

## 2017-01-11 NOTE — Progress Notes (Signed)
BH MD/PA/NP OP Progress Note  01/11/2017 12:09 PM Brenda Mcdonald  MRN:  [id number]  Chief Complaint:  Chief Complaint    Depression; Anxiety; Follow-up     Subjective: "I want to feel better" HPI:  Patient presets for urgent follow up given worsening in her anxiety. She states that she does not feel good and tends to be anxious. She feels that she hears some noises of "bee" inside her head. She has not tried to go to senior center, as she does not feel well. She denies insomnia. She denies SI, HI, VH,   Ms. Brenda Mcdonald, her niece presents to the initial part of the visit with patient consent.  Brenda Mcdonald appears to be anxious and depressed. She has been more irritable over the past few weeks, bothered by some "rolling noises" in her head. She tends to stay in the house, sitting in a chair. Brenda Mcdonald and other niece visits her place every day. Brenda Mcdonald takes care of her medication and finances. She is able to bath or eat by herself. Brenda Mcdonald has not seen patient confused. She has poor appetite. Brenda Mcdonald has not seen any episode the patient had euphoria, decreased need for sleep or increased goal directed activity. No concern for safety issues. Brenda Mcdonald states that she has not get refill of Xanax for a while. She has been taking Buspar five times per day.   Per [facility] database 01/04/2017 ALPRAZOLAM 0.25 MG TABLET, 28 tabs with no refill remaining,  LAY Tawni Pummel III MD 12/02/2016 LORAZEPAM 0.5 MG TABLET, 17 tabs with no refill [name]  Wt Readings from Last 3 Encounters:  01/11/17 197 lb (89.4 kg)  12/28/16 195 lb (88.5 kg)  08/04/16 200 lb (90.7 kg)    Visit Diagnosis:    ICD-10-CM   1. Unspecified mood (affective) disorder (HCC) F39     Past Psychiatric History:  Outpatient: Used to see Dr. Elpidio Anis, who retired 6 months ago Per record from [facility], she was diagnosed with unspecified depressive disorder, unspecified anxiety disorder, unspecified bipolar related disorder by history. She was  treated through 08/2016. Per chart, no significant mood symptoms nor psychotic symptoms were documented. She was continued on buspirone 10 mg TID, lamotrigine 200 mg qhs, quetiapine 25 mg qhs, Xanax 0.25 mg qhs.  Psychiatry admission: "several times," last in 2017 for depression and hallucinations, [facility] she was a teenager. She states that she was started on Thorazine when she was 76 years old Previous suicide attempt: denies Past trials of medication: sertraline, fluoxetine, lithium (used to be on for 24 years, had "parkinson"),   History of violence: denies  Past Medical History:  Past Medical History:  Diagnosis Date  . Anxiety   . Arthritis   . Bipolar disorder (HCC)   . COPD (chronic obstructive pulmonary disease) (HCC)   . Coronary atherosclerosis of native coronary artery    Nonobstructive  . Depression   . Diabetes mellitus, type 2 (HCC)   . Dysphagia   . Essential hypertension, benign   . GERD (gastroesophageal reflux disease)   . Pneumonia   . Pulmonary nodule July 2009   Nonspecific 5 mm right middle lobe  . TIA (transient ischemic attack)     Past Surgical History:  Procedure Laterality Date  . ABDOMINAL HYSTERECTOMY    . APPENDECTOMY    . CARDIAC CATHETERIZATION  11/2010  . CATARACT EXTRACTION, BILATERAL Bilateral   . CHOLECYSTECTOMY    . DILATION AND CURETTAGE  OF UTERUS    . INCISION AND DRAINAGE Left 03/12/2014   Procedure: LEFT INCISION/DRAINAGE DEEP ABSCESS/BURSA/HEMATOMA THIGH/KNEE REGION;  Surgeon: [name], MD;  Location: [facility] OR;  Service: Orthopedics;  Laterality: Left;  . KNEE ARTHROSCOPY Left   . ORIF DISTAL FEMUR FRACTURE Left 01/24/2014  . ORIF FEMUR FRACTURE Left 01/24/2014   Procedure: OPEN REDUCTION INTERNAL FIXATION (ORIF) DISTAL FEMUR FRACTURE;  Surgeon: [name], MD;  Location: [facility] OR;  Service: Orthopedics;  Laterality: Left;  . TIBIA FRACTURE SURGERY Left ~ 1959  . TOTAL KNEE ARTHROPLASTY  Left 2000  . VESICOVAGINAL FISTULA CLOSURE W/ TAH      Family Psychiatric History: denies  Family History:  Family History  Problem Relation Age of Onset  . Coronary artery disease Unknown     Social History:  Social History   Social History  . Marital status: Married    Spouse name: N/A  . Number of children: N/A  . Years of education: N/A   Social History Main Topics  . Smoking status: Former Smoker    Packs/day: 0.50    Years: 58.00    Types: Cigarettes    Quit date: 07/26/2010  . Smokeless tobacco: Never Used  . Alcohol use No  . Drug use: No  . Sexual activity: No   Other Topics Concern  . None   Social History Narrative  . None    Allergies:  Allergies  Allergen Reactions  . Azithromycin     Per MAR  . Penicillins     Tolerated Rocephin (July 2015)  . Tetracycline     Per MAR    Metabolic Disorder Labs: No results found for: HGBA1C, MPG No results found for: PROLACTIN Lab Results  Component Value Date   CHOL  09/06/2008    164        ATP III CLASSIFICATION:  <200     mg/dL   Desirable  734-193  mg/dL   Borderline High  >=790    mg/dL   High          TRIG 86 09/06/2008   HDL 49 09/06/2008   CHOLHDL 3.3 09/06/2008   VLDL 17 09/06/2008   LDLCALC  09/06/2008    98        Total Cholesterol/HDL:CHD Risk Coronary Heart Disease Risk Table                     Men   Women  1/2 Average Risk   3.4   3.3  Average Risk       5.0   4.4  2 X Average Risk   9.6   7.1  3 X Average Risk  23.4   11.0        Use the calculated Patient Ratio above and the CHD Risk Table to determine the patient's CHD Risk.        ATP III CLASSIFICATION (LDL):  <100     mg/dL   Optimal  240-973  mg/dL   Near or Above                    Optimal  130-159  mg/dL   Borderline  532-992  mg/dL   High  >426     mg/dL   Very High     Current Medications: Current Outpatient Prescriptions  Medication Sig Dispense Refill  . albuterol (PROVENTIL HFA;VENTOLIN HFA) 108 (90  BASE) MCG/ACT inhaler Inhale 2 puffs into the lungs every 6 (six)  hours as needed for wheezing or shortness of breath.    Marland Kitchen amLODipine (NORVASC) 10 MG tablet Take 5 mg by mouth daily.     Marland Kitchen atorvastatin (LIPITOR) 10 MG tablet TAKE 1 TABLET BY MOUTH AT BEDTIME. 30 tablet 5  . busPIRone (BUSPAR) 10 MG tablet Take 1 tablet (10 mg total) by mouth 3 (three) times daily as needed (anxiety). 90 tablet 1  . furosemide (LASIX) 40 MG tablet Take by mouth. Taking 1.5 tabs QD    . GLIPIZIDE PO Take 2 mg by mouth daily.    Marland Kitchen HYDROcodone-acetaminophen (NORCO/VICODIN) 5-325 MG per tablet Take 1 tablet by mouth every 4 (four) hours as needed for moderate pain. (Patient taking differently: Take 1 tablet by mouth 2 (two) times daily. ) 30 tablet 0  . ipratropium-albuterol (DUONEB) 0.5-2.5 (3) MG/3ML SOLN Take 3 mLs by nebulization 4 (four) times daily as needed. Shortness of breath    . lamoTRIgine (LAMICTAL) 100 MG tablet Take 1 tablet (100 mg total) by mouth at bedtime. 30 tablet 1  . memantine (NAMENDA XR) 14 MG CP24 24 hr capsule Take 14 mg by mouth daily.    . metoprolol tartrate (LOPRESSOR) 25 MG tablet TAKE (1/2) TABLET BY MOUTH 2 TIMES A DAY. 30 tablet 0  . metoprolol tartrate (LOPRESSOR) 25 MG tablet TAKE (1/2) TABLET BY MOUTH TWICE DAILY. 30 tablet 2  . mirtazapine (REMERON) 15 MG tablet Take 1 tablet (15 mg total) by mouth at bedtime. 30 tablet 1  . omeprazole (PRILOSEC) 40 MG capsule Take 20 mg by mouth daily.     . QUEtiapine (SEROQUEL) 25 MG tablet Take 1 tablet (25 mg total) by mouth at bedtime. 30 tablet 1  . solifenacin (VESICARE) 10 MG tablet Take 10 mg by mouth daily.      Marland Kitchen escitalopram (LEXAPRO) 10 MG tablet Take 1 tablet (10 mg total) by mouth daily. 30 tablet 1   No current facility-administered medications for this visit.     Neurologic: Headache: No Seizure: No Paresthesias: No  Musculoskeletal: Strength & Muscle Tone: decreased Gait & Station: in a wheel chair Patient leans:  N/A  Psychiatric Specialty Exam: Review of Systems  Psychiatric/Behavioral: Positive for depression. Negative for hallucinations, substance abuse and suicidal ideas. The patient is nervous/anxious. The patient does not have insomnia.   All other systems reviewed and are negative.   Blood pressure 138/76, pulse 68, height 5\' 4"  (1.626 m), weight 197 lb (89.4 kg), SpO2 94 %.Body mass index is 33.81 kg/m.  General Appearance: Fairly Groomed in wheelchair  Eye Contact:  Good  Speech:  Clear and Coherent  Volume:  Normal  Mood:  Anxious and Depressed  Affect:  Restricted, down  Thought Process:  Coherent and Goal Directed  Orientation:  Full (Time, Place, and Person)  Thought Content: Logical Perceptions: AH of "bee", denies VH  Suicidal Thoughts:  No  Homicidal Thoughts:  No  Memory:  Immediate;   Good Recent;   Good Remote;   Good  Judgement:  Fair  Insight:  Shallow  Psychomotor Activity:  Normal  Concentration:  Concentration: Good and Attention Span: Good  Recall:  Good  Fund of Knowledge: Good  Language: Good  Akathisia:  No  Handed:  Right  AIMS (if indicated):  N/A  Assets:  Communication Skills Desire for Improvement  ADL's:  Intact  Cognition: WNL  Sleep:  fair   MOCA: 12/30 on 12/28/2016. (-4 for visuospatial, -1 for naming, -4 for attention, -2 for language, -1  for abstraction, -5 for delayed recall, -1 for orientation "6th")  Assessment Brenda Mcdonald is a 76 year old female, history of bipolar disorder, unspecified neurocognitive disorder, essential hypertension, CAD, COPD, TIA, type II DM, who presents for follow up appointment for depression.   # Unspecified mood disorder # r/o MDD Patient continues to endorse anxiety and depression since the last encounter. Will add Lexapro to target her mood symptoms. Will continue mirtazapine to target her mood, appetite and insomnia. Will continue quetiapine for adjunctive medication. Noted that it is unclear whether she  has VH as side effect from Buspar, or VH related to her mood disorder. Will consider uptitraton of quetiapine in the future if it is not resolved at the next encounter. Will discontinue buspar (have it as prn) given limited effect. Will plan to taper off lamotrigine to avoid polypharmacy. Patient reports history of "seizure" once years ago; will continue to monitor. Both patient and the niece denies any episode consistent with mania, although she was diagnosed with bipolar disorder. Chart review with limited information. Will continue to assess her mood, although it is less likely that she has bipolar disorder. Discussed behavioral activation.   # Unspecified neurocognitive disorder She has impairment in her IADL and exam is notable for her impairment in executive functioning as evidenced in MOCA. She is on memantine prescribed by her PCP and she denies any side effect. Will continue current medication.   Plan  1. Continue Mirtazapine 15 mg at night  2. Start Lexapro 10 mg daily 3. Decrease lamotrigine 100 mg daily 4. Continue quetiapine 25 mg at night 5  Decrease buspirone 10 mg three times as needed for anxiety 6. Consider going to activities at [facility] [address] [place], [place] [address]  Contact  Carla Vass , [phone] 7. Return to clinic in one month for 30 mins  Treatment Plan Summary: Plan as above  The patient demonstrates the following risk factors for suicide: Chronic risk factors for suicide include: psychiatric disorder of bipolar disorder. Acute risk factors for suicide include: unemployment and social withdrawal/isolation. Protective factors for this patient include: positive social support, coping skills. Considering these factors, the overall suicide risk at this point appears to be low. Patient is appropriate for outpatient follow up.  The duration of this appointment visit was 30 minutes of face-to-face time with the patient.  Greater than 50% of  this time was spent in counseling, explanation of  diagnosis, planning of further management, and coordination of care.  Neysa Hotter, MD 01/11/2017, 12:09 PM

## 2017-01-11 NOTE — Patient Instructions (Signed)
1. Continue Mirtazapine 15 mg at night  2. Start Lexapro 10 mg daily 3. Decrease lamotrigine 100 mg daily 4. Continue quetiapine 25 mg at night 5  Decrease buspirone 10 mg three times as needed for anxiety 6. Consider going to activities at [facility] [address] [place], [place] [address]  Contact  Carla Armorel , [phone] 7. Return to clinic in one month for 30 mins

## 2017-01-13 DIAGNOSIS — H900 Conductive hearing loss, bilateral: Secondary | ICD-10-CM | POA: Diagnosis not present

## 2017-01-27 ENCOUNTER — Ambulatory Visit (HOSPITAL_COMMUNITY): Payer: Self-pay | Admitting: Psychiatry

## 2017-02-10 ENCOUNTER — Ambulatory Visit (HOSPITAL_COMMUNITY): Payer: Self-pay | Admitting: Psychiatry

## 2017-02-15 NOTE — Progress Notes (Signed)
BH MD/PA/NP OP Progress Note  02/16/2017 12:18 PM ANALECIA YTUARTE  MRN:  [id number]  Chief Complaint:  Chief Complaint    Depression; Follow-up; Anxiety     Subjective:  "People say I am doing better" HPI:  Patient presents for follow up appointment for depression. She states that she has less AH of "bee" since discontinuation of buspar. She called senior center, and tries to arrange transportation. She has tried to do more exercise, by taking a walk on [address] with a walker. Although she feels "always depressed" by "not able to do so much,"she enjoys reading. She believes that people thinks that she is doing well. She sleeps better when she takes quetiapine. She has increased appetite. She feels anxious. She denies panic attacks. She denies VH. She denies SI, HI. She denies decreased need of sleep or euphoria.   Wt Readings from Last 3 Encounters:  02/16/17 199 lb 6.4 oz (90.4 kg)  01/11/17 197 lb (89.4 kg)  12/28/16 195 lb (88.5 kg)    Visit Diagnosis:    ICD-10-CM   1. MDD (major depressive disorder), recurrent episode, moderate (HCC) F33.1     Past Psychiatric History:  I have reviewed the patient's psychiatry history in detail and updated the patient record. Outpatient: Used to see Dr. Elpidio Anis, who retired in 2017 Per record from [facility], she was diagnosed with unspecified depressive disorder, unspecified anxiety disorder, unspecified bipolar related disorder by history. She was treated through 08/2016. Per chart, no significant mood symptoms nor psychotic symptoms were documented. She was continued on buspirone 10 mg TID, lamotrigine 200 mg qhs, quetiapine 25 mg qhs, Xanax 0.25 mg qhs.  Psychiatry admission: "several times," last several years ago, for depression and hallucinations, [facility] she was a teenager. She states that she was started on Thorazine when she was 76 years old Previous suicide attempt: denies Past trials of  medication: sertraline, fluoxetine, lithium (used to be on for 24 years, had "parkinson"),  History of violence: denies  Past Medical History:  Past Medical History:  Diagnosis Date  . Anxiety   . Arthritis   . Bipolar disorder (HCC)   . COPD (chronic obstructive pulmonary disease) (HCC)   . Coronary atherosclerosis of native coronary artery    Nonobstructive  . Depression   . Diabetes mellitus, type 2 (HCC)   . Dysphagia   . Essential hypertension, benign   . GERD (gastroesophageal reflux disease)   . Pneumonia   . Pulmonary nodule July 2009   Nonspecific 5 mm right middle lobe  . TIA (transient ischemic attack)     Past Surgical History:  Procedure Laterality Date  . ABDOMINAL HYSTERECTOMY    . APPENDECTOMY    . CARDIAC CATHETERIZATION  11/2010  . CATARACT EXTRACTION, BILATERAL Bilateral   . CHOLECYSTECTOMY    . DILATION AND CURETTAGE OF UTERUS    . INCISION AND DRAINAGE Left 03/12/2014   Procedure: LEFT INCISION/DRAINAGE DEEP ABSCESS/BURSA/HEMATOMA THIGH/KNEE REGION;  Surgeon: [name], MD;  Location: [facility] OR;  Service: Orthopedics;  Laterality: Left;  . KNEE ARTHROSCOPY Left   . ORIF DISTAL FEMUR FRACTURE Left 01/24/2014  . ORIF FEMUR FRACTURE Left 01/24/2014   Procedure: OPEN REDUCTION INTERNAL FIXATION (ORIF) DISTAL FEMUR FRACTURE;  Surgeon: [name], MD;  Location: [facility] OR;  Service: Orthopedics;  Laterality: Left;  . TIBIA FRACTURE SURGERY Left ~ 1959  . TOTAL KNEE ARTHROPLASTY Left 2000  . VESICOVAGINAL FISTULA CLOSURE W/  TAH      Family Psychiatric History:  denies  Family History:  Family History  Problem Relation Age of Onset  . Coronary artery disease Unknown     Social History:  Social History   Social History  . Marital status: Married    Spouse name: N/A  . Number of children: N/A  . Years of education: N/A   Social History Main Topics  . Smoking status: Former Smoker    Packs/day: 0.50    Years: 58.00    Types: Cigarettes    Quit  date: 07/26/2010  . Smokeless tobacco: Never Used  . Alcohol use No  . Drug use: No  . Sexual activity: No   Other Topics Concern  . None   Social History Narrative  . None    Allergies:  Allergies  Allergen Reactions  . Azithromycin     Per MAR  . Penicillins     Tolerated Rocephin (July 2015)  . Tetracycline     Per MAR    Metabolic Disorder Labs: No results found for: HGBA1C, MPG No results found for: PROLACTIN Lab Results  Component Value Date   CHOL  09/06/2008    164        ATP III CLASSIFICATION:  <200     mg/dL   Desirable  427-062  mg/dL   Borderline High  >=376    mg/dL   High          TRIG 86 09/06/2008   HDL 49 09/06/2008   CHOLHDL 3.3 09/06/2008   VLDL 17 09/06/2008   LDLCALC  09/06/2008    98        Total Cholesterol/HDL:CHD Risk Coronary Heart Disease Risk Table                     Men   Women  1/2 Average Risk   3.4   3.3  Average Risk       5.0   4.4  2 X Average Risk   9.6   7.1  3 X Average Risk  23.4   11.0        Use the calculated Patient Ratio above and the CHD Risk Table to determine the patient's CHD Risk.        ATP III CLASSIFICATION (LDL):  <100     mg/dL   Optimal  283-151  mg/dL   Near or Above                    Optimal  130-159  mg/dL   Borderline  761-607  mg/dL   High  >371     mg/dL   Very High     Current Medications: Current Outpatient Prescriptions  Medication Sig Dispense Refill  . albuterol (PROVENTIL HFA;VENTOLIN HFA) 108 (90 BASE) MCG/ACT inhaler Inhale 2 puffs into the lungs every 6 (six) hours as needed for wheezing or shortness of breath.    Marland Kitchen amLODipine (NORVASC) 10 MG tablet Take 5 mg by mouth daily.     Marland Kitchen atorvastatin (LIPITOR) 10 MG tablet TAKE 1 TABLET BY MOUTH AT BEDTIME. 30 tablet 5  . escitalopram (LEXAPRO) 10 MG tablet Take 1 tablet (10 mg total) by mouth daily. 30 tablet 1  . furosemide (LASIX) 40 MG tablet Take by mouth. Taking 1.5 tabs QD    . GLIPIZIDE PO Take 2 mg by mouth daily.    Marland Kitchen  HYDROcodone-acetaminophen (NORCO/VICODIN) 5-325 MG per tablet Take 1 tablet by mouth  every 4 (four) hours as needed for moderate pain. (Patient taking differently: Take 1 tablet by mouth 2 (two) times daily. ) 30 tablet 0  . ipratropium-albuterol (DUONEB) 0.5-2.5 (3) MG/3ML SOLN Take 3 mLs by nebulization 4 (four) times daily as needed. Shortness of breath    . lamoTRIgine (LAMICTAL) 100 MG tablet Take 0.5 tablets (50 mg total) by mouth at bedtime. 30 tablet 0  . memantine (NAMENDA XR) 14 MG CP24 24 hr capsule Take 14 mg by mouth daily.    . metoprolol tartrate (LOPRESSOR) 25 MG tablet TAKE (1/2) TABLET BY MOUTH 2 TIMES A DAY. 30 tablet 0  . metoprolol tartrate (LOPRESSOR) 25 MG tablet TAKE (1/2) TABLET BY MOUTH TWICE DAILY. 30 tablet 2  . mirtazapine (REMERON) 15 MG tablet Take 1 tablet (15 mg total) by mouth at bedtime. 30 tablet 1  . omeprazole (PRILOSEC) 40 MG capsule Take 20 mg by mouth daily.     . QUEtiapine (SEROQUEL) 25 MG tablet Take 1 tablet (25 mg total) by mouth at bedtime. 30 tablet 1  . solifenacin (VESICARE) 10 MG tablet Take 10 mg by mouth daily.       No current facility-administered medications for this visit.     Neurologic: Headache: No Seizure: No Paresthesias: No  Musculoskeletal: Strength & Muscle Tone: decreased Gait & Station: in a wheelchair Patient leans: N/A  Psychiatric Specialty Exam: Review of Systems  Psychiatric/Behavioral: Positive for depression and hallucinations. Negative for substance abuse and suicidal ideas. The patient is nervous/anxious. The patient does not have insomnia.   All other systems reviewed and are negative.   Blood pressure 132/80, pulse 60, height 5\' 4"  (1.626 m), weight 199 lb 6.4 oz (90.4 kg).Body mass index is 34.23 kg/m.  General Appearance: Fairly Groomed  Eye Contact:  Good  Speech:  Normal Rate  Volume:  Normal  Mood:  Depressed  Affect:  Appropriate and less restricted, less anxious  Thought Process:  Coherent and  Goal Directed  Orientation:  Full (Time, Place, and Person)  Thought Content: Logical  Perceptions: AH of bee, denies VH  Suicidal Thoughts:  No  Homicidal Thoughts:  No  Memory:  Immediate;   Good Recent;   Good Remote;   Good  Judgement:  Good  Insight:  Good  Psychomotor Activity:  Normal  Concentration:  Concentration: Good and Attention Span: Good  Recall:  Good  Fund of Knowledge: Good  Language: Good  Akathisia:  No  Handed:  Right  AIMS (if indicated):  N/A  Assets:  Communication Skills Desire for Improvement  ADL's:  Intact  Cognition: WNL  Sleep:  good   MOCA: 12/30 on 12/28/2016. (-4 for visuospatial, -1 for naming, -4 for attention, -2 for language, -1 for abstraction, -5 for delayed recall, -1 for orientation "6th")  Assessment ALENA REDBIRD is a 76 y.o. year old female with a history of bipolar disorder, unspecified neurocognitive disorder, essential hypertension, CAD, COPD, TIA, type II DM, who presents for follow up appointment for MDD (major depressive disorder), recurrent episode, moderate (HCC)  # MDD, moderate, recurrent without psychotic features Exam is notable for less rumination and anxiety and depression since adding Lexapro. Will continue lexapro to target depression. Will also continue mirtazapine to target mood, appetite and insomnia. Will continue quetiapine as adjunctive treatment for depression. Noted that both the patient and her niece denies any episode consistent with mania; will taper down  lamotrigine to avoid polypharmacy (no history of seizure disorder per chart review, although  she states some "seizure like" episode in the past).  Noted that she reports less VH after discontinuation of buspar. Will continue to monitor. Discussed in length regarding behavioral activation; she is encouraged to continue walking (with a walker) and tries to go to senior center.   # Unspecified neurocognitive disorder IADL limited and exam is notable for cognitive  impairment as evidenced in MOCA. No safety issues. She is continued on memantine, prescribed by her PCP.   Plan  1. Continue mirtazapine 15 mg at night 2. Continue lexapro 10 mg daily 3. Decrease lamotrigine 50 mg daily 4. Continue quetiapine 25 mg at night 5. (Discontinue buspirone) 6. Consider going to [facility] [address] [place], [place] [address]  Contact Carla [place], [phone] 7. Return to clinic in one month for 30 mins (She is on memantine 14 mg daily)  The patient demonstrates the following risk factors for suicide: Chronic risk factors for suicide include: psychiatric disorder of bipolar disorder. Acute risk factorsfor suicide include: unemployment and social withdrawal/isolation. Protective factorsfor this patient include: positive social support, coping skills. Considering these factors, the overall suicide risk at this point appears to be low. Patient isappropriate for outpatient follow up.  Treatment Plan Summary:Plan as above  The duration of this appointment visit was 30 minutes of face-to-face time with the patient.  Greater than 50% of this time was spent in counseling, explanation of  diagnosis, planning of further management, and coordination of care.  Neysa Hotter, MD 02/16/2017, 12:18 PM

## 2017-02-16 ENCOUNTER — Ambulatory Visit (INDEPENDENT_AMBULATORY_CARE_PROVIDER_SITE_OTHER): Payer: Medicare Other | Admitting: Psychiatry

## 2017-02-16 ENCOUNTER — Encounter (HOSPITAL_COMMUNITY): Payer: Self-pay | Admitting: Psychiatry

## 2017-02-16 VITALS — BP 132/80 | HR 60 | Ht 64.0 in | Wt 199.4 lb

## 2017-02-16 DIAGNOSIS — I2510 Atherosclerotic heart disease of native coronary artery without angina pectoris: Secondary | ICD-10-CM | POA: Diagnosis not present

## 2017-02-16 DIAGNOSIS — I10 Essential (primary) hypertension: Secondary | ICD-10-CM

## 2017-02-16 DIAGNOSIS — E119 Type 2 diabetes mellitus without complications: Secondary | ICD-10-CM

## 2017-02-16 DIAGNOSIS — J449 Chronic obstructive pulmonary disease, unspecified: Secondary | ICD-10-CM

## 2017-02-16 DIAGNOSIS — F331 Major depressive disorder, recurrent, moderate: Secondary | ICD-10-CM

## 2017-02-16 DIAGNOSIS — Z87891 Personal history of nicotine dependence: Secondary | ICD-10-CM | POA: Diagnosis not present

## 2017-02-16 MED ORDER — LAMOTRIGINE 100 MG PO TABS: 50.0000 mg | ORAL_TABLET | Freq: Every day | ORAL | 0 refills | Status: DC

## 2017-02-16 MED ORDER — QUETIAPINE FUMARATE 25 MG PO TABS: 25.0000 mg | ORAL_TABLET | Freq: Every day | ORAL | 1 refills | Status: DC

## 2017-02-16 MED ORDER — MIRTAZAPINE 15 MG PO TABS: 15.0000 mg | ORAL_TABLET | Freq: Every day | ORAL | 1 refills | Status: DC

## 2017-02-16 MED ORDER — ESCITALOPRAM OXALATE 10 MG PO TABS: 10.0000 mg | ORAL_TABLET | Freq: Every day | ORAL | 1 refills | Status: DC

## 2017-02-16 NOTE — Patient Instructions (Signed)
1. Continue mirtazapine 15 mg at night 2. Continue lexapro 10 mg daily 3. Decrease lamotrigine 50 mg daily 4. Continue quetiapine 25 mg at night 5. (Discontinue buspirone) 6. Consider going to [facility] [address] [place], [place] [address]  Contact Carla [place], [phone] 7. Return to clinic in one month for 30 mins

## 2017-02-18 ENCOUNTER — Other Ambulatory Visit: Payer: Self-pay | Admitting: Cardiology

## 2017-02-22 DIAGNOSIS — J449 Chronic obstructive pulmonary disease, unspecified: Secondary | ICD-10-CM | POA: Diagnosis not present

## 2017-02-22 DIAGNOSIS — E1122 Type 2 diabetes mellitus with diabetic chronic kidney disease: Secondary | ICD-10-CM | POA: Diagnosis not present

## 2017-02-22 DIAGNOSIS — Z6833 Body mass index (BMI) 33.0-33.9, adult: Secondary | ICD-10-CM | POA: Diagnosis not present

## 2017-02-22 DIAGNOSIS — E1165 Type 2 diabetes mellitus with hyperglycemia: Secondary | ICD-10-CM | POA: Diagnosis not present

## 2017-02-22 DIAGNOSIS — I10 Essential (primary) hypertension: Secondary | ICD-10-CM | POA: Diagnosis not present

## 2017-02-22 DIAGNOSIS — E1142 Type 2 diabetes mellitus with diabetic polyneuropathy: Secondary | ICD-10-CM | POA: Diagnosis not present

## 2017-02-22 DIAGNOSIS — I359 Nonrheumatic aortic valve disorder, unspecified: Secondary | ICD-10-CM | POA: Diagnosis not present

## 2017-02-22 DIAGNOSIS — M545 Low back pain: Secondary | ICD-10-CM | POA: Diagnosis not present

## 2017-02-22 DIAGNOSIS — E785 Hyperlipidemia, unspecified: Secondary | ICD-10-CM | POA: Diagnosis not present

## 2017-02-22 DIAGNOSIS — F329 Major depressive disorder, single episode, unspecified: Secondary | ICD-10-CM | POA: Diagnosis not present

## 2017-02-22 DIAGNOSIS — N183 Chronic kidney disease, stage 3 (moderate): Secondary | ICD-10-CM | POA: Diagnosis not present

## 2017-02-22 DIAGNOSIS — Z299 Encounter for prophylactic measures, unspecified: Secondary | ICD-10-CM | POA: Diagnosis not present

## 2017-03-08 ENCOUNTER — Telehealth (HOSPITAL_COMMUNITY): Payer: Self-pay | Admitting: *Deleted

## 2017-03-08 NOTE — Telephone Encounter (Signed)
patient cancelled appointment with Brenton Grills, said she do no wish to reschedule.

## 2017-03-09 NOTE — Progress Notes (Signed)
BH MD/PA/NP OP Progress Note  03/11/2017 12:05 PM Brenda Mcdonald  MRN:  [id number]  Chief Complaint:  Chief Complaint    Follow-up; Anxiety     Subjective:  "I'm whole lot better" HPI:  - Per care everywhere, patient was evaluated at [facility] in 2010 for neurocognitive impairment and gait imbalances. Referral to GER was made. Differential include dementia secondary to multiinfarct based on head CT. - Per chart review no record of history of seizure  Patient presents 15 mins late for follow up appointment for depression. She states that she feels better since the last appointment. She is able to concentrate more, and enjoys reading. She talks about loss of her grandchild in 11/20/08who deceased at age 17 psychiatry to seizure. She occasionally feels anxious thinking about him. She feels depressed "all the time," although she reports it got better compared to before. She denies panic attacks. She denies insomnia. She has AH of bees, which has been improved. She denies VH. She has not tried senior center, although she reports that she is willing to try. She wants to see a female therapist.   Visit Diagnosis:    ICD-10-CM   1. MDD (major depressive disorder), recurrent episode, moderate (HCC) F33.1     Past Psychiatric History:  I have reviewed the patient's psychiatry history in detail and updated the patient record.  Outpatient: Used to see Dr. Elpidio Anis, who retired in 2017 Per record from [facility], she was diagnosed with unspecified depressive disorder, unspecified anxiety disorder, unspecified bipolar related disorder by history. She was treated through 08/2016. Per chart, no significant mood symptoms nor psychotic symptoms were documented. She was continued on buspirone10 mg TID, lamotrigine 200 mg qhs, quetiapine 25 mg qhs, Xanax 0.25 mg qhs.  Psychiatry admission: "several times," last several years ago, for depression and hallucinations, [facility] she was a teenager. She states that she was started on Thorazine when she was 75 years old Previous suicide attempt: denies Past trials of medication: sertraline, fluoxetine, lithium (used to be on for 24 years, had "parkinson"),   Past Medical History:  Past Medical History:  Diagnosis Date  . Anxiety   . Arthritis   . Bipolar disorder (HCC)   . COPD (chronic obstructive pulmonary disease) (HCC)   . Coronary atherosclerosis of native coronary artery    Nonobstructive  . Depression   . Diabetes mellitus, type 2 (HCC)   . Dysphagia   . Essential hypertension, benign   . GERD (gastroesophageal reflux disease)   . Pneumonia   . Pulmonary nodule July 2009   Nonspecific 5 mm right middle lobe  . TIA (transient ischemic attack)     Past Surgical History:  Procedure Laterality Date  . ABDOMINAL HYSTERECTOMY    . APPENDECTOMY    . CARDIAC CATHETERIZATION  11/2010  . CATARACT EXTRACTION, BILATERAL Bilateral   . CHOLECYSTECTOMY    . DILATION AND CURETTAGE OF UTERUS    . INCISION AND DRAINAGE Left 03/12/2014   Procedure: LEFT INCISION/DRAINAGE DEEP ABSCESS/BURSA/HEMATOMA THIGH/KNEE REGION;  Surgeon: [name], MD;  Location: [facility] OR;  Service: Orthopedics;  Laterality: Left;  . KNEE ARTHROSCOPY Left   . ORIF DISTAL FEMUR FRACTURE Left 01/24/2014  . ORIF FEMUR FRACTURE Left 01/24/2014   Procedure: OPEN REDUCTION INTERNAL FIXATION (ORIF) DISTAL FEMUR FRACTURE;  Surgeon: [name], MD;  Location: [facility] OR;  Service: Orthopedics;  Laterality: Left;  . TIBIA FRACTURE SURGERY Left ~  1959  . TOTAL KNEE ARTHROPLASTY Left 2000  . VESICOVAGINAL FISTULA CLOSURE W/ TAH      Family Psychiatric History:  I have reviewed the patient's family history in detail and updated the patient record.  Family History:  Family History  Problem Relation Age of Onset  . Coronary artery disease Unknown     Social History:  Social History   Social History  . Marital status: Married     Spouse name: N/A  . Number of children: N/A  . Years of education: N/A   Social History Main Topics  . Smoking status: Former Smoker    Packs/day: 0.50    Years: 58.00    Types: Cigarettes    Quit date: 07/26/2010  . Smokeless tobacco: Never Used  . Alcohol use No  . Drug use: No  . Sexual activity: No   Other Topics Concern  . None   Social History Narrative  . None    Allergies:  Allergies  Allergen Reactions  . Azithromycin     Per MAR  . Penicillins     Tolerated Rocephin (July 2015)  . Tetracycline     Per MAR    Metabolic Disorder Labs: No results found for: HGBA1C, MPG No results found for: PROLACTIN Lab Results  Component Value Date   CHOL  09/06/2008    164        ATP III CLASSIFICATION:  <200     mg/dL   Desirable  412-878  mg/dL   Borderline High  >=676    mg/dL   High          TRIG 86 09/06/2008   HDL 49 09/06/2008   CHOLHDL 3.3 09/06/2008   VLDL 17 09/06/2008   LDLCALC  09/06/2008    98        Total Cholesterol/HDL:CHD Risk Coronary Heart Disease Risk Table                     Men   Women  1/2 Average Risk   3.4   3.3  Average Risk       5.0   4.4  2 X Average Risk   9.6   7.1  3 X Average Risk  23.4   11.0        Use the calculated Patient Ratio above and the CHD Risk Table to determine the patient's CHD Risk.        ATP III CLASSIFICATION (LDL):  <100     mg/dL   Optimal  720-947  mg/dL   Near or Above                    Optimal  130-159  mg/dL   Borderline  096-283  mg/dL   High  >662     mg/dL   Very High     Current Medications: Current Outpatient Prescriptions  Medication Sig Dispense Refill  . albuterol (PROVENTIL HFA;VENTOLIN HFA) 108 (90 BASE) MCG/ACT inhaler Inhale 2 puffs into the lungs every 6 (six) hours as needed for wheezing or shortness of breath.    Marland Kitchen amLODipine (NORVASC) 10 MG tablet Take 5 mg by mouth daily.     Marland Kitchen atorvastatin (LIPITOR) 10 MG tablet TAKE 1 TABLET BY MOUTH AT BEDTIME. 30 tablet 5  .  escitalopram (LEXAPRO) 10 MG tablet Take 1 tablet (10 mg total) by mouth daily. 30 tablet 1  . furosemide (LASIX) 40 MG tablet Take by mouth. Taking 1.5 tabs QD    .  GLIPIZIDE PO Take 2 mg by mouth daily.    Marland Kitchen HYDROcodone-acetaminophen (NORCO/VICODIN) 5-325 MG per tablet Take 1 tablet by mouth every 4 (four) hours as needed for moderate pain. (Patient taking differently: Take 1 tablet by mouth 2 (two) times daily. ) 30 tablet 0  . ipratropium-albuterol (DUONEB) 0.5-2.5 (3) MG/3ML SOLN Take 3 mLs by nebulization 4 (four) times daily as needed. Shortness of breath    . lamoTRIgine (LAMICTAL) 100 MG tablet Take 0.5 tablets (50 mg total) by mouth at bedtime. 30 tablet 1  . memantine (NAMENDA XR) 14 MG CP24 24 hr capsule Take 14 mg by mouth daily.    . metoprolol tartrate (LOPRESSOR) 25 MG tablet TAKE (1/2) TABLET BY MOUTH TWICE DAILY. 30 tablet 0  . mirtazapine (REMERON) 15 MG tablet Take 1 tablet (15 mg total) by mouth at bedtime. 30 tablet 1  . omeprazole (PRILOSEC) 40 MG capsule Take 20 mg by mouth daily.     . QUEtiapine (SEROQUEL) 25 MG tablet Take 1 tablet (25 mg total) by mouth at bedtime. 30 tablet 1  . solifenacin (VESICARE) 10 MG tablet Take 10 mg by mouth daily.      . metoprolol tartrate (LOPRESSOR) 25 MG tablet TAKE (1/2) TABLET BY MOUTH 2 TIMES A DAY. (Patient not taking: Reported on 03/11/2017) 30 tablet 0  . metoprolol tartrate (LOPRESSOR) 25 MG tablet TAKE (1/2) TABLET BY MOUTH TWICE DAILY. (Patient not taking: Reported on 03/11/2017) 30 tablet 2   No current facility-administered medications for this visit.     Neurologic: Headache: No Seizure: No Paresthesias: No  Musculoskeletal: Strength & Muscle Tone: within normal limits Gait & Station: normal Patient leans: N/A  Psychiatric Specialty Exam: Review of Systems  Psychiatric/Behavioral: Positive for depression, hallucinations and memory loss. Negative for substance abuse and suicidal ideas. The patient is nervous/anxious.  The patient does not have insomnia.   All other systems reviewed and are negative.   Blood pressure 127/63, pulse 65, height 5\' 4"  (1.626 m), weight 209 lb (94.8 kg).Body mass index is 35.87 kg/m.  General Appearance: Fairly Groomed  Eye Contact:  Good  Speech:  Clear and Coherent  Volume:  Normal  Mood:  Depressed  Affect:  Appropriate, Congruent and brighter  Thought Process:  Coherent and Goal Directed  Orientation:  Full (Time, Place, and Person)  Thought Content: Logical Perceptions: AH of bee inside the head, denies VH  Suicidal Thoughts:  No  Homicidal Thoughts:  No  Memory:  see MOCA below  Judgement:  Fair  Insight:  Fair  Psychomotor Activity:  Normal  Concentration:  Concentration: Good and Attention Span: Good  Recall:  Good  Fund of Knowledge: Good  Language: Good  Akathisia:  No  Handed:  Right  AIMS (if indicated):  N/A  Assets:  Communication Skills Desire for Improvement  ADL's:  Intact  Cognition: WNL  Sleep:  good   MOCA: 12/30 on 12/28/2016. (-4 for visuospatial, -1 for naming, -4 for attention, -2 for language, -1 for abstraction, -5 for delayed recall, -1 for orientation "6th")  Assessment TRAM NEISLER is a 76 y.o. year old female with a history of depression, unspecified neurocognitive disorder, essential hypertension, CAD, COPD, TIA, type II DM, who presents for follow up appointment for MDD (major depressive disorder), recurrent episode, moderate (HCC)  # MDD, moderate, recurrent without psychotic features Exam is notable for significantly less rumination, anxiety and depression since adding Lexapro for depression. Will continue current dose. Will continue mirtazapine to target  mood, appetite and insomnia. Will continue quetiapine as adjunctive treatment for depression. She has not noticed any difference since taper down lamotrigine. Given she has had no significant episode of mania (collateral also support this) and no documented history of seizure,  will consider tapering off this medication in the future. Discussed behavioral activation. She is encouraged again to try senior center. She will benefit from supportive therapy to process loss of her grandchild in 2008; referral is made.   # Unspecified neurocognitive disorder IADL limited. She does have cognitive impairment as evidence in MOCA above. She is continued on memantine, prescribed by her PCP.   Plan 1. Continue mirtazapine 15 mg at night 2. Continue lexapro 10 mg daily 3. Continue lamotrigine 50 mg daily 4. Continue quetiapine 25 mg at night 5. Consider going to [facility] [address], [place], [place] [address]  Contact Carla Cope, [phone] 6. Return to clinic in two months for 30 mins (She is on memantine 14 mg daily) 7. Referral to female therapist  The patient demonstrates the following risk factors for suicide: Chronic risk factors for suicide include: psychiatric disorder of bipolar disorder. Acute risk factorsfor suicide include: unemployment and social withdrawal/isolation. Protective factorsfor this patient include: positive social support, coping skills. Considering these factors, the overall suicide risk at this point appears to be low. Patient isappropriate for outpatient follow up.  Treatment Plan Summary:Plan as above  The duration of this appointment visit was 30 minutes of face-to-face time with the patient.  Greater than 50% of this time was spent in counseling, explanation of  diagnosis, planning of further management, and coordination of care.  Neysa Hotter, MD 03/11/2017, 12:05 PM

## 2017-03-10 ENCOUNTER — Ambulatory Visit (HOSPITAL_COMMUNITY): Payer: Medicare Other | Admitting: Licensed Clinical Social Worker

## 2017-03-11 ENCOUNTER — Ambulatory Visit (INDEPENDENT_AMBULATORY_CARE_PROVIDER_SITE_OTHER): Payer: Medicare Other | Admitting: Psychiatry

## 2017-03-11 ENCOUNTER — Encounter (HOSPITAL_COMMUNITY): Payer: Self-pay | Admitting: Psychiatry

## 2017-03-11 VITALS — BP 127/63 | HR 65 | Ht 64.0 in | Wt 209.0 lb

## 2017-03-11 DIAGNOSIS — R413 Other amnesia: Secondary | ICD-10-CM | POA: Diagnosis not present

## 2017-03-11 DIAGNOSIS — R443 Hallucinations, unspecified: Secondary | ICD-10-CM

## 2017-03-11 DIAGNOSIS — J449 Chronic obstructive pulmonary disease, unspecified: Secondary | ICD-10-CM

## 2017-03-11 DIAGNOSIS — E119 Type 2 diabetes mellitus without complications: Secondary | ICD-10-CM | POA: Diagnosis not present

## 2017-03-11 DIAGNOSIS — Z8673 Personal history of transient ischemic attack (TIA), and cerebral infarction without residual deficits: Secondary | ICD-10-CM

## 2017-03-11 DIAGNOSIS — F331 Major depressive disorder, recurrent, moderate: Secondary | ICD-10-CM

## 2017-03-11 DIAGNOSIS — Z634 Disappearance and death of family member: Secondary | ICD-10-CM | POA: Diagnosis not present

## 2017-03-11 DIAGNOSIS — I10 Essential (primary) hypertension: Secondary | ICD-10-CM

## 2017-03-11 DIAGNOSIS — Z87891 Personal history of nicotine dependence: Secondary | ICD-10-CM | POA: Diagnosis not present

## 2017-03-11 MED ORDER — ESCITALOPRAM OXALATE 10 MG PO TABS
10.0000 mg | ORAL_TABLET | Freq: Every day | ORAL | 1 refills | Status: DC
Start: 2017-03-11 — End: 2017-05-11

## 2017-03-11 MED ORDER — MIRTAZAPINE 15 MG PO TABS: 15.0000 mg | ORAL_TABLET | Freq: Every day | ORAL | 1 refills | Status: DC

## 2017-03-11 MED ORDER — QUETIAPINE FUMARATE 25 MG PO TABS: 25.0000 mg | ORAL_TABLET | Freq: Every day | ORAL | 1 refills | Status: DC

## 2017-03-11 MED ORDER — LAMOTRIGINE 100 MG PO TABS: 50.0000 mg | ORAL_TABLET | Freq: Every day | ORAL | 1 refills | Status: DC

## 2017-03-11 NOTE — Patient Instructions (Addendum)
1. Continue mirtazapine 15 mg at night 2. Continue lexapro 10 mg daily 3. Continue lamotrigine 50 mg daily 4. Continue quetiapine 25 mg at night 5. Consider going to [facility] [address], [place], [place] [address]  Contact Carla Ogden, [phone] 6. Return to clinic in one month for 30 mins

## 2017-03-15 DIAGNOSIS — E11319 Type 2 diabetes mellitus with unspecified diabetic retinopathy without macular edema: Secondary | ICD-10-CM | POA: Diagnosis not present

## 2017-03-23 DIAGNOSIS — I10 Essential (primary) hypertension: Secondary | ICD-10-CM | POA: Diagnosis not present

## 2017-03-23 DIAGNOSIS — Z79899 Other long term (current) drug therapy: Secondary | ICD-10-CM | POA: Diagnosis not present

## 2017-03-23 DIAGNOSIS — F329 Major depressive disorder, single episode, unspecified: Secondary | ICD-10-CM | POA: Diagnosis not present

## 2017-03-23 DIAGNOSIS — E1142 Type 2 diabetes mellitus with diabetic polyneuropathy: Secondary | ICD-10-CM | POA: Diagnosis not present

## 2017-03-23 DIAGNOSIS — R5383 Other fatigue: Secondary | ICD-10-CM | POA: Diagnosis not present

## 2017-03-23 DIAGNOSIS — Z7189 Other specified counseling: Secondary | ICD-10-CM | POA: Diagnosis not present

## 2017-03-23 DIAGNOSIS — Z299 Encounter for prophylactic measures, unspecified: Secondary | ICD-10-CM | POA: Diagnosis not present

## 2017-03-23 DIAGNOSIS — F419 Anxiety disorder, unspecified: Secondary | ICD-10-CM | POA: Diagnosis not present

## 2017-03-23 DIAGNOSIS — N183 Chronic kidney disease, stage 3 (moderate): Secondary | ICD-10-CM | POA: Diagnosis not present

## 2017-03-23 DIAGNOSIS — E1122 Type 2 diabetes mellitus with diabetic chronic kidney disease: Secondary | ICD-10-CM | POA: Diagnosis not present

## 2017-03-23 DIAGNOSIS — Z6834 Body mass index (BMI) 34.0-34.9, adult: Secondary | ICD-10-CM | POA: Diagnosis not present

## 2017-03-23 DIAGNOSIS — Z1389 Encounter for screening for other disorder: Secondary | ICD-10-CM | POA: Diagnosis not present

## 2017-03-23 DIAGNOSIS — E1165 Type 2 diabetes mellitus with hyperglycemia: Secondary | ICD-10-CM | POA: Diagnosis not present

## 2017-03-23 DIAGNOSIS — Z0000 Encounter for general adult medical examination without abnormal findings: Secondary | ICD-10-CM | POA: Diagnosis not present

## 2017-03-23 DIAGNOSIS — E785 Hyperlipidemia, unspecified: Secondary | ICD-10-CM | POA: Diagnosis not present

## 2017-04-08 ENCOUNTER — Other Ambulatory Visit: Payer: Self-pay | Admitting: Cardiology

## 2017-05-04 NOTE — Progress Notes (Deleted)
BH MD/PA/NP OP Progress Note  05/04/2017 12:33 PM Brenda Mcdonald  MRN:  [id number]  Chief Complaint:  HPI:   May taper off lamotrigine  Visit Diagnosis: No diagnosis found.  Past Psychiatric History:  I have reviewed the patient's psychiatry history in detail and updated the patient record. Outpatient: Used to see Dr. Elpidio Anis, who retired in 2017 Per record from [facility], she was diagnosed with unspecified depressive disorder, unspecified anxiety disorder, unspecified bipolar related disorder by history. She was treated through 08/2016. Per chart, no significant mood symptoms nor psychotic symptoms were documented. She was continued on buspirone10 mg TID, lamotrigine 200 mg qhs, quetiapine 25 mg qhs, Xanax 0.25 mg qhs.  Psychiatry admission: "several times," last several years ago,for depression and hallucinations, [facility] she was a teenager. She states that she was started on Thorazine when she was 76 years old Previous suicide attempt: denies Past trials of medication: sertraline, fluoxetine, lithium (used to be on for 24 years, had "parkinson"),   Past Medical History:  Past Medical History:  Diagnosis Date  . Anxiety   . Arthritis   . Bipolar disorder (HCC)   . COPD (chronic obstructive pulmonary disease) (HCC)   . Coronary atherosclerosis of native coronary artery    Nonobstructive  . Depression   . Diabetes mellitus, type 2 (HCC)   . Dysphagia   . Essential hypertension, benign   . GERD (gastroesophageal reflux disease)   . Pneumonia   . Pulmonary nodule July 2009   Nonspecific 5 mm right middle lobe  . TIA (transient ischemic attack)     Past Surgical History:  Procedure Laterality Date  . ABDOMINAL HYSTERECTOMY    . APPENDECTOMY    . CARDIAC CATHETERIZATION  11/2010  . CATARACT EXTRACTION, BILATERAL Bilateral   . CHOLECYSTECTOMY    . DILATION AND CURETTAGE OF UTERUS    . INCISION AND DRAINAGE Left 03/12/2014   Procedure: LEFT INCISION/DRAINAGE DEEP ABSCESS/BURSA/HEMATOMA THIGH/KNEE REGION;  Surgeon: [name], MD;  Location: [facility] OR;  Service: Orthopedics;  Laterality: Left;  . KNEE ARTHROSCOPY Left   . ORIF DISTAL FEMUR FRACTURE Left 01/24/2014  . ORIF FEMUR FRACTURE Left 01/24/2014   Procedure: OPEN REDUCTION INTERNAL FIXATION (ORIF) DISTAL FEMUR FRACTURE;  Surgeon: [name], MD;  Location: [facility] OR;  Service: Orthopedics;  Laterality: Left;  . TIBIA FRACTURE SURGERY Left ~ 1959  . TOTAL KNEE ARTHROPLASTY Left 2000  . VESICOVAGINAL FISTULA CLOSURE W/ TAH      Family Psychiatric History:  I have reviewed the patient's family history in detail and updated the patient record.  Family History:  Family History  Problem Relation Age of Onset  . Coronary artery disease Unknown     Social History:  Social History   Social History  . Marital status: Married    Spouse name: N/A  . Number of children: N/A  . Years of education: N/A   Social History Main Topics  . Smoking status: Former Smoker    Packs/day: 0.50    Years: 58.00    Types: Cigarettes    Quit date: 07/26/2010  . Smokeless tobacco: Never Used  . Alcohol use No  . Drug use: No  . Sexual activity: No   Other Topics Concern  . Not on file   Social History Narrative  . No narrative on file    Allergies:  Allergies  Allergen Reactions  . Azithromycin     Per MAR  .  Penicillins     Tolerated Rocephin (July 2015)  . Tetracycline     Per MAR    Metabolic Disorder Labs: No results found for: HGBA1C, MPG No results found for: PROLACTIN Lab Results  Component Value Date   CHOL  09/06/2008    164        ATP III CLASSIFICATION:  <200     mg/dL   Desirable  220-254  mg/dL   Borderline High  >=270    mg/dL   High          TRIG 86 09/06/2008   HDL 49 09/06/2008   CHOLHDL 3.3 09/06/2008   VLDL 17 09/06/2008   LDLCALC  09/06/2008    98        Total Cholesterol/HDL:CHD Risk Coronary Heart Disease Risk Table                      Men   Women  1/2 Average Risk   3.4   3.3  Average Risk       5.0   4.4  2 X Average Risk   9.6   7.1  3 X Average Risk  23.4   11.0        Use the calculated Patient Ratio above and the CHD Risk Table to determine the patient's CHD Risk.        ATP III CLASSIFICATION (LDL):  <100     mg/dL   Optimal  623-762  mg/dL   Near or Above                    Optimal  130-159  mg/dL   Borderline  831-517  mg/dL   High  >616     mg/dL   Very High   No results found for: TSH  Therapeutic Level Labs: No results found for: LITHIUM No results found for: VALPROATE No components found for:  CBMZ  Current Medications: Current Outpatient Prescriptions  Medication Sig Dispense Refill  . albuterol (PROVENTIL HFA;VENTOLIN HFA) 108 (90 BASE) MCG/ACT inhaler Inhale 2 puffs into the lungs every 6 (six) hours as needed for wheezing or shortness of breath.    Marland Kitchen amLODipine (NORVASC) 10 MG tablet Take 5 mg by mouth daily.     Marland Kitchen atorvastatin (LIPITOR) 10 MG tablet TAKE 1 TABLET BY MOUTH AT BEDTIME. 47 tablet 10  . escitalopram (LEXAPRO) 10 MG tablet Take 1 tablet (10 mg total) by mouth daily. 30 tablet 1  . furosemide (LASIX) 40 MG tablet Take by mouth. Taking 1.5 tabs QD    . GLIPIZIDE PO Take 2 mg by mouth daily.    Marland Kitchen HYDROcodone-acetaminophen (NORCO/VICODIN) 5-325 MG per tablet Take 1 tablet by mouth every 4 (four) hours as needed for moderate pain. (Patient taking differently: Take 1 tablet by mouth 2 (two) times daily. ) 30 tablet 0  . ipratropium-albuterol (DUONEB) 0.5-2.5 (3) MG/3ML SOLN Take 3 mLs by nebulization 4 (four) times daily as needed. Shortness of breath    . lamoTRIgine (LAMICTAL) 100 MG tablet Take 0.5 tablets (50 mg total) by mouth at bedtime. 30 tablet 1  . memantine (NAMENDA XR) 14 MG CP24 24 hr capsule Take 14 mg by mouth daily.    . metoprolol tartrate (LOPRESSOR) 25 MG tablet TAKE (1/2) TABLET BY MOUTH 2 TIMES A DAY. (Patient not taking: Reported on 03/11/2017) 30  tablet 0  . metoprolol tartrate (LOPRESSOR) 25 MG tablet TAKE (1/2) TABLET BY MOUTH TWICE DAILY. (Patient not  taking: Reported on 03/11/2017) 30 tablet 2  . metoprolol tartrate (LOPRESSOR) 25 MG tablet TAKE (1/2) TABLET BY MOUTH TWICE DAILY. 47 tablet 10  . mirtazapine (REMERON) 15 MG tablet Take 1 tablet (15 mg total) by mouth at bedtime. 30 tablet 1  . omeprazole (PRILOSEC) 40 MG capsule Take 20 mg by mouth daily.     . QUEtiapine (SEROQUEL) 25 MG tablet Take 1 tablet (25 mg total) by mouth at bedtime. 30 tablet 1  . solifenacin (VESICARE) 10 MG tablet Take 10 mg by mouth daily.       No current facility-administered medications for this visit.      Musculoskeletal: Strength & Muscle Tone: within normal limits Gait & Station: normal Patient leans: N/A  Psychiatric Specialty Exam: ROS  There were no vitals taken for this visit.There is no height or weight on file to calculate BMI.  General Appearance: Fairly Groomed  Eye Contact:  Good  Speech:  Clear and Coherent  Volume:  Normal  Mood:  {BHH MOOD:22306}  Affect:  {Affect (PAA):22687}  Thought Process:  Coherent and Goal Directed  Orientation:  Full (Time, Place, and Person)  Thought Content: Logical   Suicidal Thoughts:  {ST/HT (PAA):22692}  Homicidal Thoughts:  {ST/HT (PAA):22692}  Memory:  Immediate;   Good Recent;   Good Remote;   Good  Judgement:  {Judgement (PAA):22694}  Insight:  {Insight (PAA):22695}  Psychomotor Activity:  Normal  Concentration:  Concentration: Good and Attention Span: Good  Recall:  Good  Fund of Knowledge: Good  Language: Good  Akathisia:  No  Handed:  Right  AIMS (if indicated): not done  Assets:  Communication Skills Desire for Improvement  ADL's:  Intact  Cognition: WNL  Sleep:  {BHH GOOD/FAIR/POOR:22877}   Screenings: PHQ2-9     Office Visit from 04/09/2014 in [facility] for [facility]ious Disease  PHQ-2 Total Score  0      Assessment and Plan:   MOCA: 12/30 on  12/28/2016. (-4 for visuospatial, -1 for naming, -4 for attention, -2 for language, -1 for abstraction, -5 for delayed recall, -1 for orientation "6th")  Assessment Brenda Mcdonald is a 76 y.o. year old female with a history of depression, unspecified neurocognitive disorder,  essential hypertension, CAD, COPD, TIA, type II DM, who presents for follow up appointment for No diagnosis found.  # MDD, moderate, recurrent without psychotic features  Exam is notable for significantly less rumination, anxiety and depression since adding Lexapro for depression. Will continue current dose. Will continue mirtazapine to target mood, appetite and insomnia. Will continue quetiapine as adjunctive treatment for depression. She has not noticed any difference since taper down lamotrigine. Given she has had no significant episode of mania (collateral also support this) and no documented history of seizure, will consider tapering off this medication in the future. Discussed behavioral activation. She is encouraged again to try senior center. She will benefit from supportive therapy to process loss of her grandchild in 2008; referral is made.   # Unspecified neurocognitive disorder  IADL limited. She does have cognitive impairment as evidence in MOCA above. She is continued on memantine, prescribed by her PCP.   Plan 1. Continue mirtazapine 15 mg at night 2. Continue lexapro 10 mg daily 3. Continue lamotrigine 50 mg daily 4. Continue quetiapine 25 mg at night 5. Consider going to [facility] [address], [place], [place] [address]  Contact Carla Jim Thorpe, [phone] 6. Return to clinic in two months for 30 mins (  She is on memantine 14 mg daily) 7. Referral to female therapist  The patient demonstrates the following risk factors for suicide: Chronic risk factors for suicide include: psychiatric disorder of bipolar disorder. Acute risk factorsfor suicide include: unemployment and social  withdrawal/isolation. Protective factorsfor this patient include: positive social support, coping skills. Considering these factors, the overall suicide risk at this point appears to be low. Patient isappropriate for outpatient follow up.  Neysa Hotter, MD 05/04/2017, 12:33 PM

## 2017-05-09 ENCOUNTER — Ambulatory Visit (HOSPITAL_COMMUNITY): Payer: Self-pay | Admitting: Psychiatry

## 2017-05-10 NOTE — Progress Notes (Signed)
BH MD/PA/NP OP Progress Note  05/11/2017 1:34 PM Brenda Mcdonald  MRN:  [id number]  Chief Complaint:  Chief Complaint    Depression; Follow-up     HPI:  Patient presents for follow up appointment for depression and dementia. She states that she is feeling well. She enjoys going to senior center more frequently. She believes that she is "more interested" in things, stating that she cleans the house. She brought a list of medication from the pharmacy; per list, she is still on buspar. She also states that she thought we discontinued medication. She hopes not to change in her medication as she is doing better. She feels depressed at times. She has insomnia at times. She denies SI, HI, AH/VH. She feels anxious mostly in the afternoon. She denies panic attacks. She denies decreased need for sleep or euphoria.   GDS- 8  Discussed with pharmacy Buspar 10 mg 90 tabs, TID prn for anxiety was ordered on September 5th under Dr. Vanetta Shawl (although chart review indicates no order of this medication)  Visit Diagnosis:    ICD-10-CM   1. MDD (major depressive disorder), recurrent episode, moderate (HCC) F33.1     Past Psychiatric History:  I have reviewed the patient's psychiatry history in detail and updated the patient record. Outpatient: Used to see Dr. Elpidio Anis, who retired in 2017 Per record from [facility], she was diagnosed with unspecified depressive disorder, unspecified anxiety disorder, unspecified bipolar related disorder by history. She was treated through 08/2016. Per chart, no significant mood symptoms nor psychotic symptoms were documented. She was continued on buspirone10 mg TID, lamotrigine 200 mg qhs, quetiapine 25 mg qhs, Xanax 0.25 mg qhs.  Psychiatry admission: "several times," last several years ago,for depression and hallucinations, [facility] she was a teenager. She states that she was started on Thorazine when she was 76 years old Previous  suicide attempt: denies Past trials of medication: sertraline, fluoxetine, lithium (used to be on for 24 years, had "parkinson"),   Past Medical History:  Past Medical History:  Diagnosis Date  . Anxiety   . Arthritis   . Bipolar disorder (HCC)   . COPD (chronic obstructive pulmonary disease) (HCC)   . Coronary atherosclerosis of native coronary artery    Nonobstructive  . Depression   . Diabetes mellitus, type 2 (HCC)   . Dysphagia   . Essential hypertension, benign   . GERD (gastroesophageal reflux disease)   . Pneumonia   . Pulmonary nodule July 2009   Nonspecific 5 mm right middle lobe  . TIA (transient ischemic attack)     Past Surgical History:  Procedure Laterality Date  . ABDOMINAL HYSTERECTOMY    . APPENDECTOMY    . CARDIAC CATHETERIZATION  11/2010  . CATARACT EXTRACTION, BILATERAL Bilateral   . CHOLECYSTECTOMY    . DILATION AND CURETTAGE OF UTERUS    . INCISION AND DRAINAGE Left 03/12/2014   Procedure: LEFT INCISION/DRAINAGE DEEP ABSCESS/BURSA/HEMATOMA THIGH/KNEE REGION;  Surgeon: [name], MD;  Location: [facility] OR;  Service: Orthopedics;  Laterality: Left;  . KNEE ARTHROSCOPY Left   . ORIF DISTAL FEMUR FRACTURE Left 01/24/2014  . ORIF FEMUR FRACTURE Left 01/24/2014   Procedure: OPEN REDUCTION INTERNAL FIXATION (ORIF) DISTAL FEMUR FRACTURE;  Surgeon: [name], MD;  Location: [facility] OR;  Service: Orthopedics;  Laterality: Left;  . TIBIA FRACTURE SURGERY Left ~ 1959  . TOTAL KNEE ARTHROPLASTY Left 2000  . VESICOVAGINAL FISTULA CLOSURE W/ TAH  Family Psychiatric History:  I have reviewed the patient's family history in detail and updated the patient record.  Family History:  Family History  Problem Relation Age of Onset  . Coronary artery disease Unknown     Social History:  Social History   Social History  . Marital status: Married    Spouse name: N/A  . Number of children: N/A  . Years of education: N/A   Social History Main Topics  .  Smoking status: Former Smoker    Packs/day: 0.50    Years: 58.00    Types: Cigarettes    Quit date: 07/26/2010  . Smokeless tobacco: Never Used  . Alcohol use No  . Drug use: No  . Sexual activity: No   Other Topics Concern  . None   Social History Narrative  . None    Allergies:  Allergies  Allergen Reactions  . Azithromycin     Per MAR  . Penicillins     Tolerated Rocephin (July 2015)  . Tetracycline     Per MAR    Metabolic Disorder Labs: No results found for: HGBA1C, MPG No results found for: PROLACTIN Lab Results  Component Value Date   CHOL  09/06/2008    164        ATP III CLASSIFICATION:  <200     mg/dL   Desirable  149-702  mg/dL   Borderline High  >=637    mg/dL   High          TRIG 86 09/06/2008   HDL 49 09/06/2008   CHOLHDL 3.3 09/06/2008   VLDL 17 09/06/2008   LDLCALC  09/06/2008    98        Total Cholesterol/HDL:CHD Risk Coronary Heart Disease Risk Table                     Men   Women  1/2 Average Risk   3.4   3.3  Average Risk       5.0   4.4  2 X Average Risk   9.6   7.1  3 X Average Risk  23.4   11.0        Use the calculated Patient Ratio above and the CHD Risk Table to determine the patient's CHD Risk.        ATP III CLASSIFICATION (LDL):  <100     mg/dL   Optimal  858-850  mg/dL   Near or Above                    Optimal  130-159  mg/dL   Borderline  277-412  mg/dL   High  >878     mg/dL   Very High   No results found for: TSH  Therapeutic Level Labs: No results found for: LITHIUM No results found for: VALPROATE No components found for:  CBMZ  Current Medications: Current Outpatient Prescriptions  Medication Sig Dispense Refill  . albuterol (PROVENTIL HFA;VENTOLIN HFA) 108 (90 BASE) MCG/ACT inhaler Inhale 2 puffs into the lungs every 6 (six) hours as needed for wheezing or shortness of breath.    Marland Kitchen amLODipine (NORVASC) 10 MG tablet Take 5 mg by mouth daily.     Marland Kitchen atorvastatin (LIPITOR) 10 MG tablet TAKE 1 TABLET BY  MOUTH AT BEDTIME. 47 tablet 10  . escitalopram (LEXAPRO) 10 MG tablet Take 1 tablet (10 mg total) by mouth daily. 30 tablet 2  . furosemide (LASIX) 40 MG tablet Take by mouth.  Taking 1.5 tabs QD    . GLIPIZIDE PO Take 2 mg by mouth daily.    Marland Kitchen HYDROcodone-acetaminophen (NORCO/VICODIN) 5-325 MG per tablet Take 1 tablet by mouth every 4 (four) hours as needed for moderate pain. (Patient taking differently: Take 1 tablet by mouth 2 (two) times daily. ) 30 tablet 0  . ipratropium-albuterol (DUONEB) 0.5-2.5 (3) MG/3ML SOLN Take 3 mLs by nebulization 4 (four) times daily as needed. Shortness of breath    . lamoTRIgine (LAMICTAL) 100 MG tablet Take 0.5 tablets (50 mg total) by mouth at bedtime. 15 tablet 2  . memantine (NAMENDA XR) 14 MG CP24 24 hr capsule Take 14 mg by mouth daily.    . metoprolol tartrate (LOPRESSOR) 25 MG tablet TAKE (1/2) TABLET BY MOUTH TWICE DAILY. 47 tablet 10  . mirtazapine (REMERON) 15 MG tablet Take 1 tablet (15 mg total) by mouth at bedtime. 30 tablet 2  . omeprazole (PRILOSEC) 40 MG capsule Take 20 mg by mouth daily.     . QUEtiapine (SEROQUEL) 25 MG tablet Take 1 tablet (25 mg total) by mouth at bedtime. 30 tablet 2  . solifenacin (VESICARE) 10 MG tablet Take 10 mg by mouth daily.      . busPIRone (BUSPAR) 10 MG tablet Take 1 tablet (10 mg total) by mouth 3 (three) times daily as needed (anxiety). 90 tablet 2  . metoprolol tartrate (LOPRESSOR) 25 MG tablet TAKE (1/2) TABLET BY MOUTH 2 TIMES A DAY. (Patient not taking: Reported on 03/11/2017) 30 tablet 0  . metoprolol tartrate (LOPRESSOR) 25 MG tablet TAKE (1/2) TABLET BY MOUTH TWICE DAILY. (Patient not taking: Reported on 03/11/2017) 30 tablet 2   No current facility-administered medications for this visit.      Musculoskeletal: Strength & Muscle Tone: decreased Gait & Station: on wheelchair Patient leans: N/A  Psychiatric Specialty Exam: Review of Systems  Psychiatric/Behavioral: Positive for depression and memory  loss. Negative for hallucinations, substance abuse and suicidal ideas. The patient is nervous/anxious and has insomnia.   All other systems reviewed and are negative.   Blood pressure 114/76, pulse 64, height 5\' 4"  (1.626 m), weight 204 lb (92.5 kg).Body mass index is 35.02 kg/m.  General Appearance: Fairly Groomed  Eye Contact:  Good  Speech:  Clear and Coherent  Volume:  Normal  Mood:  "better"  Affect:  Appropriate and Congruent  Thought Process:  Coherent  Orientation:  Full (Time, Place, and Person)  Thought Content: Logical Perceptions: denies AH/VH  Suicidal Thoughts:  No  Homicidal Thoughts:  No  Memory:  Immediate;   Fair Recent;   Fair Remote;   Poor  Judgement:  Good  Insight:  Fair  Psychomotor Activity:  Normal  Concentration:  Concentration: Good and Attention Span: Good  Recall:  Good  Fund of Knowledge: Good  Language: Good  Akathisia:  No  Handed:  Right  AIMS (if indicated): not done  Assets:  Communication Skills Desire for Improvement  ADL's:  Intact  Cognition: WNL  Sleep:  Poor   Screenings: PHQ2-9     Office Visit from 04/09/2014 in [facility] for Infectious Disease  PHQ-2 Total Score  0     MOCA: 12/30 on 12/28/2016. (-4 for visuospatial, -1 for naming, -4 for attention, -2 for language, -1 for abstraction, -5 for delayed recall, -1 for orientation "6th")   Assessment and Plan:  Brenda Mcdonald is a 76 y.o. year old female with a history of depression, unspecified neurocognitive disorder,  essential  hypertension, CAD, COPD, TIA, type II DM, , who presents for follow up appointment for MDD (major depressive disorder), recurrent episode, moderate (HCC)  # MDD, moderate, recurrent without psychotic features There has been significant improvement in anxiety and depression since adding lexapro. Will continue current dose of lexapro, mirtazapine to target depression. Will continue quetiapine for adjunctive treatment for depression. Will  continue lamotrigine for irritability (she prefers to stay on it at this time). Noted that buspar was not discontinued as directed; confirmed with pharmacy. Although it is preferable to avoid polypharmacy, will continue this as needed given patient preference to stay on this medication,  She has no known episode of mania (which is consistent with collateral) and no documented history of seizure. She wants to see a therapist to process loss of her grandchild in 2008; referral is made again. Discussed behavioral activation. Also discussed medication adherence; she will bring medication to the appointment to review together.   # Unspecified neurocognitive disorder She does endorse cognitive impairment as evidenced in MOCA. She is on memantine prescribed by PCP. Will continue to monitor as needed.   Plan 1. Continue mirtazapine 15 mg at night 2. Continue lexapro 10 mg daily  3. Continue lamotrigine 50 mg daily  4. Continue quetiapine 25 mg night 5. Continue buspar 10 mg three times a day as needed for anxiety  6. Return to clinic in three months for 30 mins 7. Referral to therapy  8. (She is on memantine 14 mg daily)  The patient demonstrates the following risk factors for suicide: Chronic risk factors for suicide include: psychiatric disorder of bipolar disorder. Acute risk factorsfor suicide include: unemployment and social withdrawal/isolation. Protective factorsfor this patient include: positive social support, coping skills. Considering these factors, the overall suicide risk at this point appears to be low. Patient isappropriate for outpatient follow up.  The duration of this appointment visit was 30 minutes of face-to-face time with the patient.  Greater than 50% of this time was spent in counseling, explanation of  diagnosis, planning of further management, and coordination of care.  Neysa Hotter, MD 05/11/2017, 1:34 PM

## 2017-05-11 ENCOUNTER — Ambulatory Visit (INDEPENDENT_AMBULATORY_CARE_PROVIDER_SITE_OTHER): Payer: Medicare Other | Admitting: Psychiatry

## 2017-05-11 ENCOUNTER — Encounter (HOSPITAL_COMMUNITY): Payer: Self-pay | Admitting: Psychiatry

## 2017-05-11 VITALS — BP 114/76 | HR 64 | Ht 64.0 in | Wt 204.0 lb

## 2017-05-11 DIAGNOSIS — F419 Anxiety disorder, unspecified: Secondary | ICD-10-CM

## 2017-05-11 DIAGNOSIS — E119 Type 2 diabetes mellitus without complications: Secondary | ICD-10-CM

## 2017-05-11 DIAGNOSIS — Z87891 Personal history of nicotine dependence: Secondary | ICD-10-CM | POA: Diagnosis not present

## 2017-05-11 DIAGNOSIS — R413 Other amnesia: Secondary | ICD-10-CM | POA: Diagnosis not present

## 2017-05-11 DIAGNOSIS — J449 Chronic obstructive pulmonary disease, unspecified: Secondary | ICD-10-CM | POA: Diagnosis not present

## 2017-05-11 DIAGNOSIS — G4700 Insomnia, unspecified: Secondary | ICD-10-CM

## 2017-05-11 DIAGNOSIS — F331 Major depressive disorder, recurrent, moderate: Secondary | ICD-10-CM | POA: Diagnosis not present

## 2017-05-11 DIAGNOSIS — R450 Nervousness: Secondary | ICD-10-CM | POA: Diagnosis not present

## 2017-05-11 DIAGNOSIS — I10 Essential (primary) hypertension: Secondary | ICD-10-CM | POA: Diagnosis not present

## 2017-05-11 MED ORDER — QUETIAPINE FUMARATE 25 MG PO TABS: 25.0000 mg | ORAL_TABLET | Freq: Every day | ORAL | 2 refills | Status: DC

## 2017-05-11 MED ORDER — MIRTAZAPINE 15 MG PO TABS: 15.0000 mg | ORAL_TABLET | Freq: Every day | ORAL | 2 refills | Status: DC

## 2017-05-11 MED ORDER — ESCITALOPRAM OXALATE 10 MG PO TABS: 10.0000 mg | ORAL_TABLET | Freq: Every day | ORAL | 2 refills | Status: DC

## 2017-05-11 MED ORDER — BUSPIRONE HCL 10 MG PO TABS: 10.0000 mg | ORAL_TABLET | Freq: Three times a day (TID) | ORAL | 2 refills | Status: DC | PRN

## 2017-05-11 MED ORDER — LAMOTRIGINE 100 MG PO TABS: 50.0000 mg | ORAL_TABLET | Freq: Every day | ORAL | 2 refills | Status: DC

## 2017-05-11 NOTE — Patient Instructions (Signed)
1. Continue mirtazapine 15 mg at night 2. Continue lexapro 10 mg daily  3. Continue lamotrigine 50 mg daily  4. Continue quetiapine 25 mg night 5. Continue buspar 10 mg three times a day as needed for anxiety  6. Return to clinic in three months for 30 mins 7. Referral to therapy

## 2017-05-13 ENCOUNTER — Telehealth (HOSPITAL_COMMUNITY): Payer: Self-pay | Admitting: *Deleted

## 2017-05-13 NOTE — Telephone Encounter (Signed)
Per verbal from provider she called pharmacy and they informed her that pt just recently filled her script for Buspar in September. Per provider she do not see anything In pt's chart about office filling script and they informed her that her name is on the script. Per provider, if staff called in pt script back in September and just didn't tell her. Per staff, she did not call in script and do not call in any script without provider permission. Staff called pt pharmacy to get more details. Staff spoke with Herbert Seta. Staff asked her with the script that was filled in September what the original day of that script and if it was called in or was it an auto fill. Per Herbert Seta, pt script was written on 09-16-2016 but pt did not get that script filled until 03-30-2017. Staff verbalized this information to provider. Per provider pharmacy informed her that her name was on that script but she pt first appt with her was in May. Per verbal from provider to call pharmacy back to get more information because this can not be possible. Called pt pharmacy back and spoke with Herbert Seta again. Informed Heather with information provider stated and she stated that she will call office back when she gets back to her desk. Per Herbert Seta, she wants to take a look at it and she will call back with answer. Staff provided her with office number and office hours of operation for Fridays and Herbert Seta agreed to call back before office called today.

## 2017-05-25 ENCOUNTER — Ambulatory Visit (INDEPENDENT_AMBULATORY_CARE_PROVIDER_SITE_OTHER): Payer: Medicare Other | Admitting: Psychiatry

## 2017-05-25 ENCOUNTER — Encounter (HOSPITAL_COMMUNITY): Payer: Self-pay | Admitting: Psychiatry

## 2017-05-25 DIAGNOSIS — L03119 Cellulitis of unspecified part of limb: Secondary | ICD-10-CM | POA: Diagnosis not present

## 2017-05-25 DIAGNOSIS — F331 Major depressive disorder, recurrent, moderate: Secondary | ICD-10-CM | POA: Diagnosis not present

## 2017-05-25 DIAGNOSIS — J449 Chronic obstructive pulmonary disease, unspecified: Secondary | ICD-10-CM | POA: Diagnosis not present

## 2017-05-25 DIAGNOSIS — M25569 Pain in unspecified knee: Secondary | ICD-10-CM | POA: Diagnosis not present

## 2017-05-25 DIAGNOSIS — E669 Obesity, unspecified: Secondary | ICD-10-CM | POA: Diagnosis not present

## 2017-05-25 DIAGNOSIS — Z299 Encounter for prophylactic measures, unspecified: Secondary | ICD-10-CM | POA: Diagnosis not present

## 2017-05-25 DIAGNOSIS — M545 Low back pain: Secondary | ICD-10-CM | POA: Diagnosis not present

## 2017-05-25 DIAGNOSIS — F319 Bipolar disorder, unspecified: Secondary | ICD-10-CM | POA: Diagnosis not present

## 2017-05-25 DIAGNOSIS — Z79899 Other long term (current) drug therapy: Secondary | ICD-10-CM | POA: Diagnosis not present

## 2017-05-25 DIAGNOSIS — N183 Chronic kidney disease, stage 3 (moderate): Secondary | ICD-10-CM | POA: Diagnosis not present
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
## 2017-05-31 DIAGNOSIS — Z713 Dietary counseling and surveillance: Secondary | ICD-10-CM | POA: Diagnosis not present

## 2017-05-31 DIAGNOSIS — J449 Chronic obstructive pulmonary disease, unspecified: Secondary | ICD-10-CM | POA: Diagnosis not present

## 2017-05-31 DIAGNOSIS — F319 Bipolar disorder, unspecified: Secondary | ICD-10-CM | POA: Diagnosis not present

## 2017-05-31 DIAGNOSIS — M159 Polyosteoarthritis, unspecified: Secondary | ICD-10-CM | POA: Diagnosis not present

## 2017-05-31 DIAGNOSIS — Z6835 Body mass index (BMI) 35.0-35.9, adult: Secondary | ICD-10-CM | POA: Diagnosis not present

## 2017-05-31 DIAGNOSIS — E1142 Type 2 diabetes mellitus with diabetic polyneuropathy: Secondary | ICD-10-CM | POA: Diagnosis not present

## 2017-05-31 DIAGNOSIS — Z299 Encounter for prophylactic measures, unspecified: Secondary | ICD-10-CM | POA: Diagnosis not present

## 2017-05-31 DIAGNOSIS — E1165 Type 2 diabetes mellitus with hyperglycemia: Secondary | ICD-10-CM | POA: Diagnosis not present

## 2017-06-08 ENCOUNTER — Ambulatory Visit (INDEPENDENT_AMBULATORY_CARE_PROVIDER_SITE_OTHER): Payer: Medicare Other | Admitting: Psychiatry

## 2017-06-08 ENCOUNTER — Encounter (HOSPITAL_COMMUNITY): Payer: Self-pay | Admitting: Psychiatry

## 2017-06-08 DIAGNOSIS — F331 Major depressive disorder, recurrent, moderate: Secondary | ICD-10-CM | POA: Diagnosis not present

## 2017-06-08 NOTE — Progress Notes (Signed)
   THERAPIST PROGRESS NOTE  Session Time: Wednesday 06/08/2017  10:16 AM - 11:00 AM   Participation Level: Active  Behavioral Response: CasualAlertDepressed  Type of Therapy: Individual Therapy  Treatment Goals addressed: Establish rapport, learn how to cope with feelings of depression  Interventions: Supportive  Summary: Brenda Mcdonald is a 76 y.o. female who is referred for services by psychiatrist Dr. Vanetta Shawl  due to patient experiencing symptoms of depression. She has a history of multiple psychiatric admissions for depression and hallucinations. She recently began seeing psychiatrist Dr. Vanetta Shawl for medication management. Patient states she has had nerve problems all of her life and says she has been in and out of the hospital. She reports needing somebody to talk to and says she also has depression at times.  Patient reports feeling a little more depressed since last session especially the last 3-4 days. She initially has difficulty identifying possible triggers but then says she tends to worry about her children. She states getting  along well with all of them but says 2 of her children do not get along well with each other due to a falling out about 2-3 years ago. She reports holidays and get-togethers at her home aren't  the same anymore. She reports frequent telephone calls from grandchildren but expresses sadness that they do not seem to have the time to visit more often. She also reports increased thoughts about her past. She also reports increased sleep difficulty in the past several days. She reports she does have good support from family and friends.   Suicidal/Homicidal: Nowithout intent/plan  Therapist Response: Established rapport, assisted patient identify triggers of increased depressed mood, discussed stressors, facilitate expression of thoughts and feelings, discussed strengths and supports, develop treatment plan  Plan: Return again in 2 weeks.  Diagnosis: Axis I: Major  depressive disorder, recurrent, moderate    Axis II: Deferred    BYNUM,PEGGY, LCSW 06/08/2017

## 2017-06-22 ENCOUNTER — Ambulatory Visit (HOSPITAL_COMMUNITY): Payer: Self-pay | Admitting: Psychiatry

## 2017-06-30 ENCOUNTER — Encounter (INDEPENDENT_AMBULATORY_CARE_PROVIDER_SITE_OTHER): Payer: Self-pay | Admitting: *Deleted

## 2017-06-30 NOTE — Telephone Encounter (Signed)
This encounter was created in error - please disregard.

## 2017-07-04 ENCOUNTER — Ambulatory Visit (HOSPITAL_COMMUNITY): Payer: Self-pay | Admitting: Psychiatry

## 2017-07-12 ENCOUNTER — Encounter (HOSPITAL_COMMUNITY): Payer: Self-pay | Admitting: Psychiatry

## 2017-07-12 ENCOUNTER — Ambulatory Visit (INDEPENDENT_AMBULATORY_CARE_PROVIDER_SITE_OTHER): Payer: Medicare Other | Admitting: Psychiatry

## 2017-07-12 DIAGNOSIS — F331 Major depressive disorder, recurrent, moderate: Secondary | ICD-10-CM

## 2017-07-12 NOTE — Progress Notes (Signed)
   THERAPIST PROGRESS NOTE  Session Time: Tuesday 07/12/2017 9:15 AM - 10:03 AM       Participation Level: Active  Behavioral Response: CasualAlertDepressed  Type of Therapy: Individual Therapy  Treatment Goals addressed: learn how to cope with feelings of depression  Interventions: Supportive  Summary: Brenda Mcdonald is a 76 y.o. female who is referred for services by psychiatrist Dr. Vanetta Shawl  due to patient experiencing symptoms of depression. She has a history of multiple psychiatric admissions for depression and hallucinations. She recently began seeing psychiatrist Dr. Vanetta Shawl for medication management. Patient states she has had nerve problems all of her life and says she has been in and out of the hospital. She reports needing somebody to talk to and says she also has depression at times.  Patient reports continuing to  feel  depressed since last session. She continues to expresses sadness and frustration her family especially her son and daughter don't visit her like they did before they had a falling out 3 years ago. She also expresses sadness and frustration her husband makes negative comments about her daughter being in an interracial marriage. She reports she is unable to talk with husband about her daughter due to this. She also expresses frustration she is unable to perform tasks she has performed in the past. She reports continued  support from family and friends.   Suicidal/Homicidal: Nowithout intent/plan  Therapist Response:  assisted patient identify triggers of increased depressed mood, discussed stressors, facilitated expression of thoughts and feelings, validated feelings, discussed ways patient could maintain and nourish contact with family and friends, discussed rationale for and provided instructions for keeping a gratitude journal, assigned patient to complete and bring to next session  Plan: Return again in 2 weeks.  Diagnosis: Axis I: Major depressive disorder,  recurrent, moderate    Axis II: Deferred    Eilee Schader, LCSW 07/12/2017

## 2017-08-02 ENCOUNTER — Ambulatory Visit (HOSPITAL_COMMUNITY): Payer: Self-pay | Admitting: Psychiatry

## 2017-08-11 ENCOUNTER — Encounter (HOSPITAL_COMMUNITY): Payer: Self-pay | Admitting: Psychiatry

## 2017-08-11 ENCOUNTER — Ambulatory Visit (INDEPENDENT_AMBULATORY_CARE_PROVIDER_SITE_OTHER): Payer: Medicare Other | Admitting: Psychiatry

## 2017-08-11 VITALS — BP 144/80 | HR 67 | Ht 64.0 in | Wt 212.0 lb

## 2017-08-11 DIAGNOSIS — R413 Other amnesia: Secondary | ICD-10-CM | POA: Diagnosis not present

## 2017-08-11 DIAGNOSIS — F419 Anxiety disorder, unspecified: Secondary | ICD-10-CM

## 2017-08-11 DIAGNOSIS — F331 Major depressive disorder, recurrent, moderate: Secondary | ICD-10-CM

## 2017-08-11 DIAGNOSIS — R419 Unspecified symptoms and signs involving cognitive functions and awareness: Secondary | ICD-10-CM | POA: Diagnosis not present

## 2017-08-11 DIAGNOSIS — Z87891 Personal history of nicotine dependence: Secondary | ICD-10-CM | POA: Diagnosis not present

## 2017-08-11 DIAGNOSIS — Z8673 Personal history of transient ischemic attack (TIA), and cerebral infarction without residual deficits: Secondary | ICD-10-CM

## 2017-08-11 DIAGNOSIS — R450 Nervousness: Secondary | ICD-10-CM | POA: Diagnosis not present

## 2017-08-11 MED ORDER — LAMOTRIGINE 25 MG PO TABS: 25.0000 mg | ORAL_TABLET | Freq: Every day | ORAL | 0 refills | Status: DC

## 2017-08-11 MED ORDER — BUSPIRONE HCL 10 MG PO TABS: 10.0000 mg | ORAL_TABLET | Freq: Three times a day (TID) | ORAL | 0 refills | Status: DC | PRN

## 2017-08-11 MED ORDER — QUETIAPINE FUMARATE 25 MG PO TABS: 25.0000 mg | ORAL_TABLET | Freq: Every day | ORAL | 0 refills | Status: DC

## 2017-08-11 MED ORDER — MIRTAZAPINE 15 MG PO TABS: 15.0000 mg | ORAL_TABLET | Freq: Every day | ORAL | 0 refills | Status: DC

## 2017-08-11 MED ORDER — ESCITALOPRAM OXALATE 10 MG PO TABS: 10.0000 mg | ORAL_TABLET | Freq: Every day | ORAL | 0 refills | Status: DC

## 2017-08-11 NOTE — Patient Instructions (Signed)
1. Continue mirtazapine 15 mg at night 2. Continue lexapro 10 mg daily  3. Decrease lamotrigine 25 mg daily  4. Continue quetiapine 25 mg night 5. Continue buspar 10 mg three times a day as needed for anxiety  6. Return to clinic in three months for 30 mins

## 2017-08-11 NOTE — Progress Notes (Signed)
[facility] MD/PA/NP OP Progress Note  08/11/2017 11:28 AM Brenda Mcdonald  MRN:  [id number]  Chief Complaint:  Chief Complaint    Follow-up; Anxiety; Memory Loss; Depression     HPI:  Patient presents for follow-up appointment for depression and neurocognitive disorder.  She states that she has been doing "alright ."  She had a good time on holidays, when her son and his family visited her.  She occasionally feels depressed and feels tired to feel depressed.  She is concerned about her husband who had a stroke, who appears to be confused at times.  Her niece and her husband stays with them during the day.  She does house chores regularly.  Although she does not go outside, she will listen to music or enjoy reading or coloring.  She occasionally feels confused and has some memory loss.  She feels ambivalent about changing her medication, stating that she would usually get worse when there is change in her medication. She likes her therapist and she finds her to be very helpful. She has good sleep.  She has fair appetite.  She has good concentration.  She feels fatigued at times.  She denies SI.  She feels anxious and tense at times.  She has less panic attacks.  She denies decreased need for sleep or euphoria.   Wt Readings from Last 3 Encounters:  08/11/17 212 lb (96.2 kg)  05/11/17 204 lb (92.5 kg)  03/11/17 209 lb (94.8 kg)    Visit Diagnosis:    ICD-10-CM   1. MDD (major depressive disorder), recurrent episode, moderate (HCC) F33.1     Past Psychiatric History:  I have reviewed the patient's psychiatry history in detail and updated the patient record. Outpatient: Used to see Dr. Elpidio Anis, who retired in 2017 Per record from [facility], she was diagnosed with unspecified depressive disorder, unspecified anxiety disorder, unspecified bipolar related disorder by history. She was treated through 08/2016. Per chart, no significant mood symptoms nor psychotic symptoms were documented. She was continued on  buspirone10 mg TID, lamotrigine 200 mg qhs, quetiapine 25 mg qhs, Xanax 0.25 mg qhs.  Psychiatry admission: "several times," last several years ago,for depression and hallucinations, [facility] she was a teenager. She states that she was started on Thorazine when she was 77 years old Previous suicide attempt: denies Past trials of medication: sertraline, fluoxetine, lithium (used to be on for 24 years, had "parkinson"),    Past Medical History:  Past Medical History:  Diagnosis Date  . Anxiety   . Arthritis   . Bipolar disorder (HCC)   . COPD (chronic obstructive pulmonary disease) (HCC)   . Coronary atherosclerosis of native coronary artery    Nonobstructive  . Depression   . Diabetes mellitus, type 2 (HCC)   . Dysphagia   . Essential hypertension, benign   . GERD (gastroesophageal reflux disease)   . Pneumonia   . Pulmonary nodule July 2009   Nonspecific 5 mm right middle lobe  . TIA (transient ischemic attack)     Past Surgical History:  Procedure Laterality Date  . ABDOMINAL HYSTERECTOMY    . APPENDECTOMY    . CARDIAC CATHETERIZATION  11/2010  . CATARACT EXTRACTION, BILATERAL Bilateral   . CHOLECYSTECTOMY    . DILATION AND CURETTAGE OF UTERUS    . INCISION AND DRAINAGE Left 03/12/2014   Procedure: LEFT INCISION/DRAINAGE DEEP ABSCESS/BURSA/HEMATOMA THIGH/KNEE REGION;  Surgeon: [name], MD;  Location: [facility] OR;  Service: Orthopedics;  Laterality: Left;  . KNEE ARTHROSCOPY Left   . ORIF DISTAL FEMUR FRACTURE Left 01/24/2014  . ORIF FEMUR FRACTURE Left 01/24/2014   Procedure: OPEN REDUCTION INTERNAL FIXATION (ORIF) DISTAL FEMUR FRACTURE;  Surgeon: [name], MD;  Location: [facility] OR;  Service: Orthopedics;  Laterality: Left;  . TIBIA FRACTURE SURGERY Left ~ 1959  . TOTAL KNEE ARTHROPLASTY Left 2000  . VESICOVAGINAL FISTULA CLOSURE W/ TAH      Family Psychiatric History: I have reviewed the patient's family history in  detail and updated the patient record.  Family History:  Family History  Problem Relation Age of Onset  . Coronary artery disease Unknown     Social History:  Social History   Socioeconomic History  . Marital status: Married    Spouse name: None  . Number of children: None  . Years of education: None  . Highest education level: None  Social Needs  . Financial resource strain: None  . Food insecurity - worry: None  . Food insecurity - inability: None  . Transportation needs - medical: None  . Transportation needs - non-medical: None  Occupational History  . None  Tobacco Use  . Smoking status: Former Smoker    Packs/day: 0.50    Years: 58.00    Pack years: 29.00    Types: Cigarettes    Last attempt to quit: 07/26/2010    Years since quitting: 7.0  . Smokeless tobacco: Never Used  Substance and Sexual Activity  . Alcohol use: No    Alcohol/week: 0.0 oz  . Drug use: No  . Sexual activity: No  Other Topics Concern  . None  Social History Narrative  . None    Allergies:  Allergies  Allergen Reactions  . Azithromycin     Per MAR  . Penicillins     Tolerated Rocephin (July 2015)  . Tetracycline     Per MAR    Metabolic Disorder Labs: No results found for: HGBA1C, MPG No results found for: PROLACTIN Lab Results  Component Value Date   CHOL  09/06/2008    164        ATP III CLASSIFICATION:  <200     mg/dL   Desirable  119-147  mg/dL   Borderline High  >=829    mg/dL   High          TRIG 86 09/06/2008   HDL 49 09/06/2008   CHOLHDL 3.3 09/06/2008   VLDL 17 09/06/2008   LDLCALC  09/06/2008    98        Total Cholesterol/HDL:CHD Risk Coronary Heart Disease Risk Table                     Men   Women  1/2 Average Risk   3.4   3.3  Average Risk       5.0   4.4  2 X Average Risk   9.6   7.1  3 X Average Risk  23.4   11.0        Use the calculated Patient Ratio above and the CHD Risk Table to determine the patient's CHD Risk.        ATP III  CLASSIFICATION (LDL):  <100     mg/dL   Optimal  562-130  mg/dL   Near or Above                    Optimal  130-159  mg/dL   Borderline  160-189  mg/dL   High  >573     mg/dL   Very High   No results found for: TSH  Therapeutic Level Labs: No results found for: LITHIUM No results found for: VALPROATE No components found for:  CBMZ  Current Medications: Current Outpatient Medications  Medication Sig Dispense Refill  . albuterol (PROVENTIL HFA;VENTOLIN HFA) 108 (90 BASE) MCG/ACT inhaler Inhale 2 puffs into the lungs every 6 (six) hours as needed for wheezing or shortness of breath.    Marland Kitchen amLODipine (NORVASC) 10 MG tablet Take 5 mg by mouth daily.     Marland Kitchen atorvastatin (LIPITOR) 10 MG tablet TAKE 1 TABLET BY MOUTH AT BEDTIME. 47 tablet 10  . busPIRone (BUSPAR) 10 MG tablet Take 1 tablet (10 mg total) by mouth 3 (three) times daily as needed (anxiety). 270 tablet 0  . escitalopram (LEXAPRO) 10 MG tablet Take 1 tablet (10 mg total) by mouth daily. 90 tablet 0  . furosemide (LASIX) 40 MG tablet Take by mouth. Taking 1.5 tabs QD    . GLIPIZIDE PO Take 2 mg by mouth daily.    Marland Kitchen HYDROcodone-acetaminophen (NORCO/VICODIN) 5-325 MG per tablet Take 1 tablet by mouth every 4 (four) hours as needed for moderate pain. (Patient taking differently: Take 1 tablet by mouth 2 (two) times daily. ) 30 tablet 0  . ipratropium-albuterol (DUONEB) 0.5-2.5 (3) MG/3ML SOLN Take 3 mLs by nebulization 4 (four) times daily as needed. Shortness of breath    . lamoTRIgine (LAMICTAL) 25 MG tablet Take 1 tablet (25 mg total) by mouth at bedtime. 90 tablet 0  . memantine (NAMENDA XR) 14 MG CP24 24 hr capsule Take 14 mg by mouth daily.    . metoprolol tartrate (LOPRESSOR) 25 MG tablet TAKE (1/2) TABLET BY MOUTH 2 TIMES A DAY. 30 tablet 0  . metoprolol tartrate (LOPRESSOR) 25 MG tablet TAKE (1/2) TABLET BY MOUTH TWICE DAILY. 30 tablet 2  . metoprolol tartrate (LOPRESSOR) 25 MG tablet TAKE (1/2) TABLET BY MOUTH TWICE DAILY. 47  tablet 10  . mirtazapine (REMERON) 15 MG tablet Take 1 tablet (15 mg total) by mouth at bedtime. 90 tablet 0  . omeprazole (PRILOSEC) 40 MG capsule Take 20 mg by mouth daily.     . QUEtiapine (SEROQUEL) 25 MG tablet Take 1 tablet (25 mg total) by mouth at bedtime. 90 tablet 0  . solifenacin (VESICARE) 10 MG tablet Take 10 mg by mouth daily.       No current facility-administered medications for this visit.      Musculoskeletal: Strength & Muscle Tone: decreased Gait & Station: on wheelchair Patient leans: N/A  Psychiatric Specialty Exam: Review of Systems  Psychiatric/Behavioral: Positive for depression and memory loss. Negative for hallucinations, substance abuse and suicidal ideas. The patient is nervous/anxious. The patient does not have insomnia.   All other systems reviewed and are negative.   Blood pressure (!) 144/80, pulse 67, height 5\' 4"  (1.626 m), weight 212 lb (96.2 kg), SpO2 93 %.Body mass index is 36.39 kg/m.  General Appearance: Fairly Groomed  Eye Contact:  Good  Speech:  Clear and Coherent  Volume:  Normal  Mood:  "good"  Affect:  brighter  Thought Process:  Coherent and Goal Directed  Orientation:  Full (Time, Place, and Person) except date (15th)  Thought Content: Logical   Suicidal Thoughts:  No  Homicidal Thoughts:  No  Memory:  Immediate;   Fair  Judgement:  Good  Insight:  Fair  Psychomotor Activity:  Normal  Concentration:  Concentration: Fair and Attention Span: Fair  Recall:  Fair  Fund of Knowledge: Good  Language: Good  Akathisia:  No  Handed:  Right  AIMS (if indicated): not done  Assets:  Communication Skills Desire for Improvement  ADL's:  Intact  Cognition: WNL  Sleep:  Good   Screenings: PHQ2-9     [facility] Visit from 04/09/[facility] in [facility] for Infectious Disease  PHQ-2 Total Score  0     GDS- 8 04/2017  MOCA: 12/30 on 12/28/2016. (-4 for visuospatial, -1 for naming, -4 for attention, -2 for language, -1 for  abstraction, -5 for delayed recall, -1 for orientation "6th")  Assessment and Plan:  KATRI CREASMAN is a 77 y.o. year old female with a history of depression, unspecified neurocognitive disorder, hypertension, CAD, COPD, TIA, type II DM , who presents for follow up appointment for MDD (major depressive disorder), recurrent episode, moderate (HCC)  # MDD, moderate, recurrent without psychotic features Exam is notable for brighter affect and there has been significant improvement in anxiety and depression, which coincided with adding Lexapro and starting to see a therapist.  Will continue current dose of Lexapro, mirtazapine to target depression.  Will continue quetiapine for adjunctive treatment for depression.  Will taper down lamotrigine; will use this medication for irritability. She has no known episode of mania and no documented history of seizure. Will continue BuSpar for anxiety given patient preference.  Although it is preferable to avoid polypharmacy, she has reasonable anxiety to change her medication; will try to slowly taper off her medication as indicated.    Discussed behavioral activation.  She is encouraged to continue to see a therapist.   # Unspecified neurocognitive disorder She complains of occasional memory loss.  She does have neurocognitive impairment as in [facility]  She is on memantine, prescribed by PCP.  Will continue to monitor.   Plan I have reviewed and updated plans as below 1. Continue mirtazapine 15 mg at night 2. Continue lexapro 10 mg daily  3. Decrease lamotrigine 25 mg daily  4. Continue quetiapine 25 mg night 5. Continue buspar 10 mg three times a day as needed for anxiety  6. Return to clinic in three months for 30 mins 7. Referral to therapy  8. (She is on memantine 14 mg daily)  The patient demonstrates the following risk factors for suicide: Chronic risk factors for suicide include: psychiatric disorder of bipolar disorder. Acute risk factorsfor suicide  include: unemployment and social withdrawal/isolation. Protective factorsfor this patient include: positive social support, coping skills. Considering these factors, the overall suicide risk at this point appears to be low. Patient isappropriate for outpatient follow up.  The duration of this appointment visit was 30 minutes of face-to-face time with the patient.  Greater than 50% of this time was spent in counseling, explanation of  diagnosis, planning of further management, and coordination of care.  Neysa Hotter, MD 08/11/2017, 11:28 AM

## 2017-08-12 ENCOUNTER — Ambulatory Visit (HOSPITAL_COMMUNITY): Payer: Self-pay | Admitting: Psychiatry

## 2017-08-17 ENCOUNTER — Ambulatory Visit (INDEPENDENT_AMBULATORY_CARE_PROVIDER_SITE_OTHER): Payer: Medicare Other | Admitting: Psychiatry

## 2017-08-17 ENCOUNTER — Encounter (HOSPITAL_COMMUNITY): Payer: Self-pay | Admitting: Psychiatry

## 2017-08-17 DIAGNOSIS — F331 Major depressive disorder, recurrent, moderate: Secondary | ICD-10-CM

## 2017-08-17 NOTE — Progress Notes (Signed)
   THERAPIST PROGRESS NOTE  Session Time: Wednesday 08/17/2017 10:03 AM -  10:52 AM                                   Participation Level: Active  Behavioral Response: CasualAlertDepressed  Type of Therapy: Individual Therapy  Treatment Goals addressed: learn how to cope with feelings of depression  Interventions: Supportive  Summary: Brenda Mcdonald is a 77 y.o. female who is referred for services by psychiatrist Dr. Vanetta Shawl  due to patient experiencing symptoms of depression. She has a history of multiple psychiatric admissions for depression and hallucinations. She recently began seeing psychiatrist Dr. Vanetta Shawl for medication management. Patient states she has had nerve problems all of her life and says she has been in and out of the hospital. She reports needing somebody to talk to and says she also has depression at times.  Patient reports improved mood since last session. She reports enjoying celebrating Christmas with her family.  However, she expresses sadness her son and daughter still will not visit her at the same time due to a falling out 3 years ago. But, both of them did visit patient during the holidays at separate times. She reports being very happy when she saw her deceased grandsons daughter who stayed overnight with patient. Patient has increased involvement in activities within her capabilities including small household tasks. She also is initiating contact with family members. She expresses continued sadness regarding decreased capabilities and some worry about the future.   Suicidal/Homicidal: Nowithout intent/plan  Therapist Response:  Reviewed symptoms, praise and reinforced patient's increased involvement in activity, facilitate expression of thoughts and feelings, assisted patient identify her thought patterns and the way she relates to her thoughts, discussed patient's values and assisted patient identify ways to increase value congruent activity  Plan: Return again in 2  weeks.  Diagnosis: Axis I: Major depressive disorder, recurrent, moderate    Axis II: Deferred    Anglea Gordner, LCSW 08/17/2017

## 2017-08-29 DIAGNOSIS — Z6835 Body mass index (BMI) 35.0-35.9, adult: Secondary | ICD-10-CM | POA: Diagnosis not present

## 2017-08-29 DIAGNOSIS — N183 Chronic kidney disease, stage 3 (moderate): Secondary | ICD-10-CM | POA: Diagnosis not present

## 2017-08-29 DIAGNOSIS — Z299 Encounter for prophylactic measures, unspecified: Secondary | ICD-10-CM | POA: Diagnosis not present

## 2017-08-29 DIAGNOSIS — J449 Chronic obstructive pulmonary disease, unspecified: Secondary | ICD-10-CM | POA: Diagnosis not present

## 2017-08-29 DIAGNOSIS — M159 Polyosteoarthritis, unspecified: Secondary | ICD-10-CM | POA: Diagnosis not present

## 2017-08-29 DIAGNOSIS — E1165 Type 2 diabetes mellitus with hyperglycemia: Secondary | ICD-10-CM | POA: Diagnosis not present

## 2017-08-29 DIAGNOSIS — I10 Essential (primary) hypertension: Secondary | ICD-10-CM | POA: Diagnosis not present

## 2017-09-05 ENCOUNTER — Encounter (HOSPITAL_COMMUNITY): Payer: Self-pay | Admitting: Psychiatry

## 2017-09-05 ENCOUNTER — Ambulatory Visit (INDEPENDENT_AMBULATORY_CARE_PROVIDER_SITE_OTHER): Payer: Medicare Other | Admitting: Psychiatry

## 2017-09-05 DIAGNOSIS — F331 Major depressive disorder, recurrent, moderate: Secondary | ICD-10-CM

## 2017-09-05 NOTE — Progress Notes (Signed)
   THERAPIST PROGRESS NOTE  Session Time: Monday 09/05/2017 3:17 PM -4:05 PM                                          Participation Level: Active  Behavioral Response: CasualAlertDepressed  Type of Therapy: Individual Therapy  Treatment Goals addressed: learn how to cope with feelings of depression  Interventions: Supportive  Summary: Brenda Mcdonald is a 77 y.o. female who is referred for services by psychiatrist Dr. Vanetta Shawl  due to patient experiencing symptoms of depression. She has a history of multiple psychiatric admissions for depression and hallucinations. She recently began seeing psychiatrist Dr. Vanetta Shawl for medication management. Patient states she has had nerve problems all of her life and says she has been in and out of the hospital. She reports needing somebody to talk to and says she also has depression at times.  Patient reports increased worry and stress in the past few days regarding her husband's health. Per her report, he just doesn't seem to be himself and has been less active. She expresses worry and fear he may have another stroke and die. She also reports increased thoughts and worry about the condition of her family. This appears to have been triggered by a recent visit from her son and his wife. Patient continues to express sadness regarding the incident that caused a rift between her son and daughter 3 years ago. Patient did journal between sessions and reports this was helpful. i  Suicidal/Homicidal: Nowithout intent/plan  Therapist Response:  Reviewed symptoms, praised and reinforced patient's use of journaling, facilitated expression of thoughts and feelings, assisted patient identify her thought patterns and the way she relates to her thoughts, assisted patient identify strengths she has used to manage previous adversities, discussed patient's tendency to underestimate her ability to manage adversity,  Plan: Return again in 2 weeks.  Diagnosis: Axis I: Major  depressive disorder, recurrent, moderate    Axis II: Deferred    BYNUM,PEGGY, LCSW 09/05/2017

## 2017-09-06 DIAGNOSIS — F319 Bipolar disorder, unspecified: Secondary | ICD-10-CM | POA: Diagnosis not present

## 2017-09-06 DIAGNOSIS — E1142 Type 2 diabetes mellitus with diabetic polyneuropathy: Secondary | ICD-10-CM | POA: Diagnosis not present

## 2017-09-06 DIAGNOSIS — M159 Polyosteoarthritis, unspecified: Secondary | ICD-10-CM | POA: Diagnosis not present

## 2017-09-06 DIAGNOSIS — I10 Essential (primary) hypertension: Secondary | ICD-10-CM | POA: Diagnosis not present

## 2017-09-06 DIAGNOSIS — J449 Chronic obstructive pulmonary disease, unspecified: Secondary | ICD-10-CM | POA: Diagnosis not present

## 2017-09-06 DIAGNOSIS — N183 Chronic kidney disease, stage 3 (moderate): Secondary | ICD-10-CM | POA: Diagnosis not present

## 2017-09-06 DIAGNOSIS — Z299 Encounter for prophylactic measures, unspecified: Secondary | ICD-10-CM | POA: Diagnosis not present

## 2017-09-06 DIAGNOSIS — E785 Hyperlipidemia, unspecified: Secondary | ICD-10-CM | POA: Diagnosis not present

## 2017-09-06 DIAGNOSIS — E1165 Type 2 diabetes mellitus with hyperglycemia: Secondary | ICD-10-CM | POA: Diagnosis not present

## 2017-09-19 ENCOUNTER — Ambulatory Visit (INDEPENDENT_AMBULATORY_CARE_PROVIDER_SITE_OTHER): Payer: Medicare Other | Admitting: Psychiatry

## 2017-09-19 ENCOUNTER — Telehealth (HOSPITAL_COMMUNITY): Payer: Self-pay | Admitting: Psychiatry

## 2017-09-19 ENCOUNTER — Encounter (HOSPITAL_COMMUNITY): Payer: Self-pay | Admitting: Psychiatry

## 2017-09-19 DIAGNOSIS — F331 Major depressive disorder, recurrent, moderate: Secondary | ICD-10-CM

## 2017-09-19 MED ORDER — LAMOTRIGINE 100 MG PO TABS: 50.0000 mg | ORAL_TABLET | Freq: Every day | ORAL | 0 refills | Status: DC

## 2017-09-19 NOTE — Progress Notes (Signed)
   THERAPIST PROGRESS NOTE  Session         Time: Monday 09/19/2017 3:10 PM - 4:00 PM                                     Participation Level: Active  Behavioral Response: CasualAlertDepressed  Type of Therapy: Individual Therapy  Treatment Goals addressed: learn how to cope with feelings of depression   Interventions: Supportive  Summary: Brenda Mcdonald is a 77 y.o. female who is referred for services by psychiatrist Dr. Vanetta Shawl  due to patient experiencing symptoms of depression. She has a history of multiple psychiatric admissions for depression and hallucinations. She recently began seeing psychiatrist Dr. Vanetta Shawl for medication management. Patient states she has had nerve problems all of her life and says she has been in and out of the hospital. She reports needing somebody to talk to and says she also has depression at times.  Patient reports increased nervousness and depressed mood since last session. Per her report, she has experienced significant sleep difficulty since changing her medication. Patient reports waking up several times during the night. She continues to express frustration and sadness regarding her decreased physical functioning and family member not getting along well. Her son and his wife visited again but patient says things just aren't the same. She reports decreased involvement in activity. She wants to engage in conversations with others but reports reluctance to do so as she needs a new hearing aid. She fears people are talking about her or may get upset if she asks people to repeat themselves. She is working with someone from [employer] [facility] who is helping her with this per her report.   Therapist Response:  Reviewed symptoms, assisted patient identify triggers of increased anxiety and depressed mood, facilitated expression of thoughts and feelings, advise patient to talk to CMA regarding medication concerns to facilitate information being shared with Dr. Vanetta Shawl,   patient to identify activities congruent with her values to pursue to increase behavioral activation, assisted patient identify/challenge/and replace negative thoughts about socializing with healthy alternatives,  Plan: Return again in 2 weeks.  Diagnosis: Axis I: Major depressive disorder, recurrent, moderate    Axis II: Deferred    Cece Milhouse, LCSW 09/19/2017

## 2017-09-19 NOTE — Telephone Encounter (Signed)
Per message, "Patient came in visit with therapist Craig Guess that since medication change she nervous & can't sleep. She stated dosage was cut down from 100 mg to 25 mg. She can't remember the name but thinks it starts with a 'L'."   Ordered lamotrigine 50 mg daily. Please advise the patient to increase lamotrigine to 50 mg daily. There is a new order she can get, or she can take 2 tabs of 25 mg (total of 50 mg).

## 2017-10-03 ENCOUNTER — Encounter (HOSPITAL_COMMUNITY): Payer: Self-pay | Admitting: Psychiatry

## 2017-10-03 ENCOUNTER — Ambulatory Visit (INDEPENDENT_AMBULATORY_CARE_PROVIDER_SITE_OTHER): Payer: Medicare Other | Admitting: Psychiatry

## 2017-10-03 DIAGNOSIS — F331 Major depressive disorder, recurrent, moderate: Secondary | ICD-10-CM

## 2017-10-03 NOTE — Progress Notes (Signed)
      THERAPIST PROGRESS NOTE  Session Time: Monday 10/03/2017 3:17 PM - 4:00 PM                                    Participation Level: Active  Behavioral Response: CasualAlertDepressed  Type of Therapy: Individual Therapy  Treatment Goals addressed: learn how to cope with feelings of depression   Interventions: Supportive  Summary: Brenda Mcdonald is a 77 y.o. female who is referred for services by psychiatrist Dr. Vanetta Shawl  due to patient experiencing symptoms of depression. She has a history of multiple psychiatric admissions for depression and hallucinations. She recently began seeing psychiatrist Dr. Vanetta Shawl for medication management. Patient states she has had nerve problems all of her life and says she has been in and out of the hospital. She reports needing somebody to talk to and says she also has depression at times.  Patient reports continued depressed mood since last session. She talked with CMA regarding medication issues and patient was given instructions. Patient reports she has not implemented yet but will follow up with her pharmacy regarding the new prescription. She continues to express sadness and frustration regarding her family and her inability to do things like she used to do. She expresses desire to have more social involvement but expresses ambivalence about doing so as she needs a new hearing aid. She says she has a list of possible providers to contact but hasn't done it.    Therapist Response:  Reviewed symptoms, assisted patient identify triggers of increased anxiety and depressed mood, facilitated expression of thoughts and feelings, assisted patient identify her thought patterns about making efforts to contact providers regarding hearing aid, assisted patient identify/challenge/and replace negative thoughts about taking steps to ask for assistance from niece in contacting providers, assigned patient to implement steps discussed in session to talk with niece and select  provider  Plan: Return again in 2 weeks.  Diagnosis: Axis I: Major depressive disorder, recurrent, moderate    Axis II: Deferred    Gwendolynn Merkey, LCSW 10/03/2017

## 2017-10-31 ENCOUNTER — Ambulatory Visit (HOSPITAL_COMMUNITY): Payer: Self-pay | Admitting: Psychiatry

## 2017-11-03 DIAGNOSIS — M545 Low back pain: Secondary | ICD-10-CM | POA: Diagnosis not present

## 2017-11-03 DIAGNOSIS — E1122 Type 2 diabetes mellitus with diabetic chronic kidney disease: Secondary | ICD-10-CM | POA: Diagnosis not present

## 2017-11-03 DIAGNOSIS — Z6835 Body mass index (BMI) 35.0-35.9, adult: Secondary | ICD-10-CM | POA: Diagnosis not present

## 2017-11-03 DIAGNOSIS — E1165 Type 2 diabetes mellitus with hyperglycemia: Secondary | ICD-10-CM | POA: Diagnosis not present

## 2017-11-03 DIAGNOSIS — N183 Chronic kidney disease, stage 3 (moderate): Secondary | ICD-10-CM | POA: Diagnosis not present

## 2017-11-03 DIAGNOSIS — J449 Chronic obstructive pulmonary disease, unspecified: Secondary | ICD-10-CM | POA: Diagnosis not present

## 2017-11-03 DIAGNOSIS — F319 Bipolar disorder, unspecified: Secondary | ICD-10-CM | POA: Diagnosis not present

## 2017-11-03 DIAGNOSIS — Z299 Encounter for prophylactic measures, unspecified: Secondary | ICD-10-CM | POA: Diagnosis not present

## 2017-11-03 DIAGNOSIS — E1142 Type 2 diabetes mellitus with diabetic polyneuropathy: Secondary | ICD-10-CM | POA: Diagnosis not present

## 2017-11-09 ENCOUNTER — Ambulatory Visit (HOSPITAL_COMMUNITY): Payer: Self-pay | Admitting: Psychiatry

## 2017-11-13 NOTE — Progress Notes (Signed)
[facility] MD/PA/NP OP Progress Note  11/14/2017 3:16 PM Brenda Mcdonald  MRN:  [id number]  Chief Complaint:  Chief Complaint    Follow-up; Depression; Memory Loss     HPI:  Lamotrigine is increased back to 50 mg daily given patient preference since the last appointment.  Patient presents for follow-up appointment for depression and dementia. She states that she "cannot take things anymore." When she is asked to elaborate it, she reports her frustration that her niece at home thinks she cannot do anything as she is in a wheelchair. She reports it is "hard to explain,"  But talks about an example that her niece does not think she can clean the house, although she does it on weekend. She states that her niece believes she is right all the time. She was at "hyper state" when she lowered lamotrigine; she felt she can do anything. She wishes to stay on current medication. She feels depressed at times. She denies insomnia. She has fair appetite. She has difficult in concentration at times. She denies SI. She feels anxious at times. She denies panic attacks. She denies decreased need for sleep or increased goal directed activity.   Wt Readings from Last 3 Encounters:  11/14/17 199 lb (90.3 kg)  08/11/17 212 lb (96.2 kg)  05/11/17 204 lb (92.5 kg)    Visit Diagnosis:    ICD-10-CM   1. MDD (major depressive disorder), recurrent episode, moderate (HCC) F33.1     Past Psychiatric History:  I have reviewed the patient's psychiatry history in detail and updated the patient record. Outpatient: Used to see Dr. Elpidio Anis, who retired in 2017 Per record from [facility], she was diagnosed with unspecified depressive disorder, unspecified anxiety disorder, unspecified bipolar related disorder by history. She was treated through 08/2016. Per chart, no significant mood symptoms nor psychotic symptoms were documented. She was continued on buspirone10 mg TID, lamotrigine 200 mg qhs, quetiapine 25 mg qhs, Xanax 0.25 mg qhs.   Psychiatry admission: "several times," last several years ago,for depression and hallucinations, [facility] she was a teenager. She states that she was started on Thorazine when she was 77 years old Previous suicide attempt: denies Past trials of medication: sertraline, fluoxetine, lithium (used to be on for 24 years, had "parkinson"),    Past Medical History:  Past Medical History:  Diagnosis Date  . Anxiety   . Arthritis   . Bipolar disorder (HCC)   . COPD (chronic obstructive pulmonary disease) (HCC)   . Coronary atherosclerosis of native coronary artery    Nonobstructive  . Depression   . Diabetes mellitus, type 2 (HCC)   . Dysphagia   . Essential hypertension, benign   . GERD (gastroesophageal reflux disease)   . Pneumonia   . Pulmonary nodule July 2009   Nonspecific 5 mm right middle lobe  . TIA (transient ischemic attack)     Past Surgical History:  Procedure Laterality Date  . ABDOMINAL HYSTERECTOMY    . APPENDECTOMY    . CARDIAC CATHETERIZATION  11/2010  . CATARACT EXTRACTION, BILATERAL Bilateral   . CHOLECYSTECTOMY    . DILATION AND CURETTAGE OF UTERUS    . INCISION AND DRAINAGE Left 03/12/2014   Procedure: LEFT INCISION/DRAINAGE DEEP ABSCESS/BURSA/HEMATOMA THIGH/KNEE REGION;  Surgeon: [name], MD;  Location: [facility] OR;  Service: Orthopedics;  Laterality: Left;  . KNEE ARTHROSCOPY Left   . ORIF DISTAL FEMUR FRACTURE Left 01/24/2014  . ORIF FEMUR FRACTURE Left 01/24/2014  Procedure: OPEN REDUCTION INTERNAL FIXATION (ORIF) DISTAL FEMUR FRACTURE;  Surgeon: [name], MD;  Location: [facility] OR;  Service: Orthopedics;  Laterality: Left;  . TIBIA FRACTURE SURGERY Left ~ 1959  . TOTAL KNEE ARTHROPLASTY Left 2000  . VESICOVAGINAL FISTULA CLOSURE W/ TAH      Family Psychiatric History:  I have reviewed the patient's family history in detail and updated the patient record.  Family History:  Family History  Problem  Relation Age of Onset  . Coronary artery disease Unknown     Social History:  Social History   Socioeconomic History  . Marital status: Married    Spouse name: Not on file  . Number of children: Not on file  . Years of education: Not on file  . Highest education level: Not on file  Occupational History  . Not on file  Social Needs  . Financial resource strain: Not on file  . Food insecurity:    Worry: Not on file    Inability: Not on file  . Transportation needs:    Medical: Not on file    Non-medical: Not on file  Tobacco Use  . Smoking status: Former Smoker    Packs/day: 0.50    Years: 58.00    Pack years: 29.00    Types: Cigarettes    Last attempt to quit: 07/26/2010    Years since quitting: 7.3  . Smokeless tobacco: Never Used  Substance and Sexual Activity  . Alcohol use: No    Alcohol/week: 0.0 oz  . Drug use: No  . Sexual activity: Never  Lifestyle  . Physical activity:    Days per week: Not on file    Minutes per session: Not on file  . Stress: Not on file  Relationships  . Social connections:    Talks on phone: Not on file    Gets together: Not on file    Attends religious service: Not on file    Active member of club or organization: Not on file    Attends meetings of clubs or organizations: Not on file    Relationship status: Not on file  Other Topics Concern  . Not on file  Social History Narrative  . Not on file    Allergies:  Allergies  Allergen Reactions  . Azithromycin     Per MAR  . Penicillins     Tolerated Rocephin (July 2015)  . Tetracycline     Per MAR    Metabolic Disorder Labs: No results found for: HGBA1C, MPG No results found for: PROLACTIN Lab Results  Component Value Date   CHOL  09/06/2008    164        ATP III CLASSIFICATION:  <200     mg/dL   Desirable  563-875  mg/dL   Borderline High  >=643    mg/dL   High          TRIG 86 09/06/2008   HDL 49 09/06/2008   CHOLHDL 3.3 09/06/2008   VLDL 17 09/06/2008    LDLCALC  09/06/2008    98        Total Cholesterol/HDL:CHD Risk Coronary Heart Disease Risk Table                     Men   Women  1/2 Average Risk   3.4   3.3  Average Risk       5.0   4.4  2 X Average Risk   9.6   7.1  3 X Average Risk  23.4   11.0        Use the calculated Patient Ratio above and the CHD Risk Table to determine the patient's CHD Risk.        ATP III CLASSIFICATION (LDL):  <100     mg/dL   Optimal  119-147  mg/dL   Near or Above                    Optimal  130-159  mg/dL   Borderline  829-562  mg/dL   High  >130     mg/dL   Very High   No results found for: TSH  Therapeutic Level Labs: No results found for: LITHIUM No results found for: VALPROATE No components found for:  CBMZ  Current Medications: Current Outpatient Medications  Medication Sig Dispense Refill  . albuterol (PROVENTIL HFA;VENTOLIN HFA) 108 (90 BASE) MCG/ACT inhaler Inhale 2 puffs into the lungs every 6 (six) hours as needed for wheezing or shortness of breath.    Marland Kitchen amLODipine (NORVASC) 10 MG tablet Take 5 mg by mouth daily.     Marland Kitchen atorvastatin (LIPITOR) 10 MG tablet TAKE 1 TABLET BY MOUTH AT BEDTIME. 47 tablet 10  . busPIRone (BUSPAR) 10 MG tablet Take 1 tablet (10 mg total) by mouth 3 (three) times daily as needed (anxiety). 270 tablet 0  . escitalopram (LEXAPRO) 10 MG tablet Take 1 tablet (10 mg total) by mouth daily. 90 tablet 0  . furosemide (LASIX) 40 MG tablet Take by mouth. Taking 1.5 tabs QD    . GLIPIZIDE PO Take 2 mg by mouth daily.    Marland Kitchen HYDROcodone-acetaminophen (NORCO/VICODIN) 5-325 MG per tablet Take 1 tablet by mouth every 4 (four) hours as needed for moderate pain. (Patient taking differently: Take 1 tablet by mouth 2 (two) times daily. ) 30 tablet 0  . ipratropium-albuterol (DUONEB) 0.5-2.5 (3) MG/3ML SOLN Take 3 mLs by nebulization 4 (four) times daily as needed. Shortness of breath    . lamoTRIgine (LAMICTAL) 25 MG tablet Take 2 tablets (50 mg total) by mouth at bedtime. 180  tablet 0  . memantine (NAMENDA XR) 14 MG CP24 24 hr capsule Take 14 mg by mouth daily.    . metoprolol tartrate (LOPRESSOR) 25 MG tablet TAKE (1/2) TABLET BY MOUTH 2 TIMES A DAY. 30 tablet 0  . metoprolol tartrate (LOPRESSOR) 25 MG tablet TAKE (1/2) TABLET BY MOUTH TWICE DAILY. 30 tablet 2  . metoprolol tartrate (LOPRESSOR) 25 MG tablet TAKE (1/2) TABLET BY MOUTH TWICE DAILY. 47 tablet 10  . mirtazapine (REMERON) 15 MG tablet Take 1 tablet (15 mg total) by mouth at bedtime. 90 tablet 0  . omeprazole (PRILOSEC) 40 MG capsule Take 20 mg by mouth daily.     . QUEtiapine (SEROQUEL) 25 MG tablet Take 1 tablet (25 mg total) by mouth at bedtime. 90 tablet 0  . solifenacin (VESICARE) 10 MG tablet Take 10 mg by mouth daily.       No current facility-administered medications for this visit.      Musculoskeletal: Strength & Muscle Tone: decreased Gait & Station: on wheelchair Patient leans: N/A  Psychiatric Specialty Exam: Review of Systems  Psychiatric/Behavioral: Positive for depression and memory loss. Negative for hallucinations, substance abuse and suicidal ideas. The patient is nervous/anxious. The patient does not have insomnia.   All other systems reviewed and are negative.   Blood pressure 102/62, pulse 91, height 5\' 4"  (1.626 m), weight 199 lb (90.3 kg),  SpO2 99 %.Body mass index is 34.16 kg/m.  General Appearance: Fairly Groomed  Eye Contact:  Good  Speech:  Clear and Coherent  Volume:  Normal  Mood:  Depressed  Affect:  Appropriate, Congruent and euthymic, slightly restricted  Thought Process:  Coherent, irrelevant at times  Orientation:  Full (Time, Place, and Person) except "April 23rd,Tuesday"  Thought Content: Logical   Suicidal Thoughts:  No  Homicidal Thoughts:  No  Memory:  Immediate;   Fair  Judgement:  Fair  Insight:  Fair  Psychomotor Activity:  Normal  Concentration:  Concentration: Fair and Attention Span: Fair  Recall:  [employer] of Knowledge: Fair   Language: Good  Akathisia:  No  Handed:  Right  AIMS (if indicated): not done  Assets:  Communication Skills Desire for Improvement  ADL's:  Intact  Cognition: WNL  Sleep:  Good   Screenings: PHQ2-9     Office Visit from 04/09/2014 in [facility]  PHQ-2 Total Score  0     GDS-8 04/2017  MOCA: 12/30 on 12/28/2016. (-4 for visuospatial, -1 for naming, -4 for attention, -2 for language, -1 for abstraction, -5 for delayed recall, -1 for orientation "6th")  Assessment and Plan:  Brenda Mcdonald is a 77 y.o. year old female with a history of depression, unspecified neurocognitive disorder, hypertension, CAD, COPD, TIA, type II DM, who presents for follow up appointment for MDD (major depressive disorder), recurrent episode, moderate (HCC)  # MDD, moderate, recurrent without psychotic features # r/o bipolar II disorder Patient reports occasional anxiety and neurovegetative symptoms. Will continue current dose of lexapro, mirtazapine to target depression. Will continue buspar for anxiety. Will continue quetiapine and lamotrigine for mood dysregulation. Although it is preferable to avoid polypharmacy, patient reports strong preference to stay on current regimen. Will continue to monitor.   # Unspecified neurocognitive disorder She does have cognitive impairment as in MOCA. She is on memantine, prescribed by PCP. There is a slight concern about adherence to medication; will consider discussing with her family if the patient approves.    Plan I have reviewed and updated plans as below 1. Continue mirtazapine 15 mg at night 2. Continue lexapro 10 mg daily  3. Continue lamotrigine 50 mg daily (reports episode of "hyper state" at lower dose) 4. Continue quetiapine 25 mg night 5. Continue buspar 10 mg three times a day as needed for anxiety  6. Return to clinic in three months for 30 mins 8.(She is on memantine 14 mg daily)  The patient demonstrates  the following risk factors for suicide: Chronic risk factors for suicide include: psychiatric disorder of bipolar disorder. Acute risk factorsfor suicide include: unemployment and social withdrawal/isolation. Protective factorsfor this patient include: positive social support, coping skills. Considering these factors, the overall suicide risk at this point appears to be low. Patient isappropriate for outpatient follow up.  The duration of this appointment visit was 30 minutes of face-to-face time with the patient.  Greater than 50% of this time was spent in counseling, explanation of  diagnosis, planning of further management, and coordination of care.  Neysa Hotter, MD 11/14/2017, 3:16 PM

## 2017-11-14 ENCOUNTER — Ambulatory Visit (INDEPENDENT_AMBULATORY_CARE_PROVIDER_SITE_OTHER): Payer: Medicare Other | Admitting: Psychiatry

## 2017-11-14 ENCOUNTER — Encounter (HOSPITAL_COMMUNITY): Payer: Self-pay | Admitting: Psychiatry

## 2017-11-14 VITALS — BP 102/62 | HR 91 | Ht 64.0 in | Wt 199.0 lb

## 2017-11-14 DIAGNOSIS — Z6379 Other stressful life events affecting family and household: Secondary | ICD-10-CM

## 2017-11-14 DIAGNOSIS — F419 Anxiety disorder, unspecified: Secondary | ICD-10-CM | POA: Diagnosis not present

## 2017-11-14 DIAGNOSIS — F331 Major depressive disorder, recurrent, moderate: Secondary | ICD-10-CM

## 2017-11-14 DIAGNOSIS — R450 Nervousness: Secondary | ICD-10-CM | POA: Diagnosis not present

## 2017-11-14 DIAGNOSIS — R413 Other amnesia: Secondary | ICD-10-CM

## 2017-11-14 DIAGNOSIS — R419 Unspecified symptoms and signs involving cognitive functions and awareness: Secondary | ICD-10-CM | POA: Diagnosis not present

## 2017-11-14 DIAGNOSIS — Z87891 Personal history of nicotine dependence: Secondary | ICD-10-CM

## 2017-11-14 MED ORDER — LAMOTRIGINE 25 MG PO TABS: 50.0000 mg | ORAL_TABLET | Freq: Every day | ORAL | 0 refills | Status: DC

## 2017-11-14 MED ORDER — QUETIAPINE FUMARATE 25 MG PO TABS: 25.0000 mg | ORAL_TABLET | Freq: Every day | ORAL | 0 refills | Status: DC

## 2017-11-14 MED ORDER — ESCITALOPRAM OXALATE 10 MG PO TABS: 10.0000 mg | ORAL_TABLET | Freq: Every day | ORAL | 0 refills | Status: DC

## 2017-11-14 MED ORDER — BUSPIRONE HCL 10 MG PO TABS: 10.0000 mg | ORAL_TABLET | Freq: Three times a day (TID) | ORAL | 0 refills | Status: DC | PRN

## 2017-11-14 MED ORDER — MIRTAZAPINE 15 MG PO TABS: 15.0000 mg | ORAL_TABLET | Freq: Every day | ORAL | 0 refills | Status: DC

## 2017-11-14 NOTE — Patient Instructions (Signed)
1. Continue mirtazapine 15 mg at night 2. Continue lexapro 10 mg daily  3. Continue lamotrigine 50 mg daily  4. Continue quetiapine 25 mg night 5. Continue buspar 10 mg three times a day as needed for anxiety  6. Return to clinic in three months for 30 mins

## 2017-11-21 ENCOUNTER — Ambulatory Visit (INDEPENDENT_AMBULATORY_CARE_PROVIDER_SITE_OTHER): Payer: Medicare Other | Admitting: Psychiatry

## 2017-11-21 DIAGNOSIS — F331 Major depressive disorder, recurrent, moderate: Secondary | ICD-10-CM

## 2017-11-21 NOTE — Progress Notes (Signed)
      THERAPIST PROGRESS NOTE  Session Time: Monday 11/21/2017 3:07 PM - 3:59 PM                                        Participation Level: Active  Behavioral Response: CasualAlert/improved mood, talkative  Type of Therapy: Individual Therapy  Treatment Goals addressed: learn how to cope with feelings of depression   Interventions: Supportive  Summary: Brenda Mcdonald is a 77 y.o. female who is referred for services by psychiatrist Dr. Vanetta Shawl  due to patient experiencing symptoms of depression. She has a history of multiple psychiatric admissions for depression and hallucinations. She recently began seeing psychiatrist Dr. Vanetta Shawl for medication management. Patient states she has had nerve problems all of her life and says she has been in and out of the hospital. She reports needing somebody to talk to and says she also has depression at times.  Patient reports improved mood since last session. She reports increased socialization and involvement in activity. Per her report, she, her niece, and friends have been meeting at her home doing crafts like painting bird houses. Patient also has been initiating more social contact via phone conversations. She reports reading magazines and doing daily exercises within her capability. She reports she and husband stayed alone at night this past weekend after consultation with caseworker and patient's niece. Patient reports being very pleased about this as she felt more independent and productive.  She contacted her niece and talked with caseworker regarding steps to get a new hearing aid. Paperwork has been completed regarding financial assistance for this and contact has been made with a potential provider. Patient is hopeful about this. Patient is very pleased with her progress but expresses fear she may have another bout of depression.   Therapist Response:  Reviewed symptoms, praised and reinforced patient's increased involvement in activity and  socialization, discussed effects on thoughts/mood/behavior, is to patient identify ways to maintain behavioral activation and socialization, discussed lapse versus relapse, assist patient identify early symptoms of depression, discussed interventions to avoid relapse,   Plan: Return again in 2 weeks.  Diagnosis: Axis I: Major depressive disorder, recurrent, moderate    Axis II: Deferred    Twanisha Foulk, LCSW 11/21/2017

## 2017-12-05 ENCOUNTER — Ambulatory Visit (INDEPENDENT_AMBULATORY_CARE_PROVIDER_SITE_OTHER): Payer: Medicare Other | Admitting: Psychiatry

## 2017-12-05 DIAGNOSIS — F331 Major depressive disorder, recurrent, moderate: Secondary | ICD-10-CM | POA: Diagnosis not present

## 2017-12-05 NOTE — Progress Notes (Signed)
      THERAPIST PROGRESS NOTE  Session Time: Monday 12/05/2017 3:02 PM                                           Participation Level: Active  Behavioral Response: CasualAlert/improved mood, talkative  Type of Therapy: Individual Therapy  Treatment Goals addressed: learn how to cope with feelings of depression   Interventions: Supportive/CBT  Summary: Brenda Mcdonald is a 77 y.o. female who is referred for services by psychiatrist Dr. Vanetta Shawl  due to patient experiencing symptoms of depression. She has a history of multiple psychiatric admissions for depression and hallucinations. She recently began seeing psychiatrist Dr. Vanetta Shawl for medication management. Patient states she has had nerve problems all of her life and says she has been in and out of the hospital. She reports needing somebody to talk to and says she also has depression at times.  Patient reports doing well since last session. She says she feels like she may be a little manic because she has been doing more tasks. However, she recognizes the need to pace self and use relaxation techniques. She is pleased her daughter who lives in [place] has been visiting with her for the past 2 weeks. She reports enjoying celebrating Mother's Day with her family yesterday. She also reports she enjoyed attending church. She says she and her husband continued to stay be themselves at night prior to daughter's visit and states this makes her feel better about herself. Patient is still waiting for information about hearing aid and is hopeful having a new hearing aid will improve her opportunities to socialize.   Therapist Response:  Reviewed symptoms, praised and reinforced patient's continueed  involvement in activity and socialization, discussed effects on thoughts/mood/behavior,assisted  patient identify ways to maintain behavioral activation and socialization, reviewed lapse versus relapse, reviewed  early symptoms of depression, discussed  interventions to avoid relapse, discussed relaxation techniques, discussed possible step down plan including decreasing frequency of sessions.    Plan: Return again in 4  weeks.  Diagnosis: Axis I: Major depressive disorder, recurrent, moderate    Axis II: Deferred    Lenyx Boody, LCSW 12/05/2017

## 2017-12-09 DIAGNOSIS — M545 Low back pain: Secondary | ICD-10-CM | POA: Diagnosis not present

## 2017-12-09 DIAGNOSIS — E1142 Type 2 diabetes mellitus with diabetic polyneuropathy: Secondary | ICD-10-CM | POA: Diagnosis not present

## 2017-12-09 DIAGNOSIS — J449 Chronic obstructive pulmonary disease, unspecified: Secondary | ICD-10-CM | POA: Diagnosis not present

## 2017-12-09 DIAGNOSIS — E1122 Type 2 diabetes mellitus with diabetic chronic kidney disease: Secondary | ICD-10-CM | POA: Diagnosis not present

## 2017-12-09 DIAGNOSIS — Z299 Encounter for prophylactic measures, unspecified: Secondary | ICD-10-CM | POA: Diagnosis not present

## 2017-12-09 DIAGNOSIS — N183 Chronic kidney disease, stage 3 (moderate): Secondary | ICD-10-CM | POA: Diagnosis not present

## 2017-12-09 DIAGNOSIS — F3130 Bipolar disorder, current episode depressed, mild or moderate severity, unspecified: Secondary | ICD-10-CM | POA: Diagnosis not present

## 2017-12-09 DIAGNOSIS — E1165 Type 2 diabetes mellitus with hyperglycemia: Secondary | ICD-10-CM | POA: Diagnosis not present

## 2017-12-09 DIAGNOSIS — M159 Polyosteoarthritis, unspecified: Secondary | ICD-10-CM | POA: Diagnosis not present

## 2017-12-15 ENCOUNTER — Ambulatory Visit (INDEPENDENT_AMBULATORY_CARE_PROVIDER_SITE_OTHER): Payer: Medicare Other | Admitting: Psychiatry

## 2017-12-15 VITALS — BP 126/76 | HR 67 | Ht 64.0 in

## 2017-12-15 DIAGNOSIS — F3181 Bipolar II disorder: Secondary | ICD-10-CM

## 2017-12-15 DIAGNOSIS — Z87891 Personal history of nicotine dependence: Secondary | ICD-10-CM

## 2017-12-15 DIAGNOSIS — R419 Unspecified symptoms and signs involving cognitive functions and awareness: Secondary | ICD-10-CM | POA: Diagnosis not present

## 2017-12-15 DIAGNOSIS — G4700 Insomnia, unspecified: Secondary | ICD-10-CM

## 2017-12-15 DIAGNOSIS — R413 Other amnesia: Secondary | ICD-10-CM

## 2017-12-15 MED ORDER — LAMOTRIGINE 100 MG PO TABS: 100.0000 mg | ORAL_TABLET | Freq: Every day | ORAL | 0 refills | Status: DC

## 2017-12-15 NOTE — Progress Notes (Signed)
BH MD/PA/NP OP Progress Note  12/15/2017 11:02 AM Brenda Mcdonald  MRN:  [id number]  Chief Complaint:  Chief Complaint    Follow-up; Other; Depression     HPI:  Patient presents for follow-up appointment for mood disorder. The majority of the history is provided by her niece.  She states that she has been feeling "manic," "high." She likes it better than feeling depressed. She was told by her social worker that she is manic. Per niece, who visits the patient every day, the patient has been staying up all night and taking out clothes. She talks constantly; she is loud and has fast speech. She has been this way for the past two weeks. Her niece will put medication in her pill box and she believes the patient is taking medication every day. [facility] Child psychotherapist and nurse visits the patient occasionally. Her husband is disabled and has not been able to comment any change per niece. She has insomnia. She denies feeling fatigue or depressed. She denies anxiety. She denies SI, HI, Ah, VH. She denies decreased need for sleep. The patient was admitted for "manic episode' two years ago in the context of taking benadryl. She was staying up all night and had chest pain and shortness of breath, which brought her to the hospital.  Wt Readings from Last 3 Encounters:  11/14/17 199 lb (90.3 kg)  08/11/17 212 lb (96.2 kg)  05/11/17 204 lb (92.5 kg)    Visit Diagnosis:    ICD-10-CM   1. Bipolar II disorder (HCC) F31.81     Past Psychiatric History:  Please see initial evaluation for full details. I have reviewed the history. No updates at this time.    Past Medical History:  Past Medical History:  Diagnosis Date  . Anxiety   . Arthritis   . Bipolar disorder (HCC)   . COPD (chronic obstructive pulmonary disease) (HCC)   . Coronary atherosclerosis of native coronary artery    Nonobstructive  . Depression   . Diabetes mellitus, type 2 (HCC)   . Dysphagia   . Essential hypertension, benign   . GERD  (gastroesophageal reflux disease)   . Pneumonia   . Pulmonary nodule July 2009   Nonspecific 5 mm right middle lobe  . TIA (transient ischemic attack)     Past Surgical History:  Procedure Laterality Date  . ABDOMINAL HYSTERECTOMY    . APPENDECTOMY    . CARDIAC CATHETERIZATION  11/2010  . CATARACT EXTRACTION, BILATERAL Bilateral   . CHOLECYSTECTOMY    . DILATION AND CURETTAGE OF UTERUS    . INCISION AND DRAINAGE Left 03/12/2014   Procedure: LEFT INCISION/DRAINAGE DEEP ABSCESS/BURSA/HEMATOMA THIGH/KNEE REGION;  Surgeon: [name], MD;  Location: [facility] OR;  Service: Orthopedics;  Laterality: Left;  . KNEE ARTHROSCOPY Left   . ORIF DISTAL FEMUR FRACTURE Left 01/24/2014  . ORIF FEMUR FRACTURE Left 01/24/2014   Procedure: OPEN REDUCTION INTERNAL FIXATION (ORIF) DISTAL FEMUR FRACTURE;  Surgeon: [name], MD;  Location: [facility] OR;  Service: Orthopedics;  Laterality: Left;  . TIBIA FRACTURE SURGERY Left ~ 1959  . TOTAL KNEE ARTHROPLASTY Left 2000  . VESICOVAGINAL FISTULA CLOSURE W/ TAH      Family Psychiatric History: Please see initial evaluation for full details. I have reviewed the history. No updates at this time.     Family History:  Family History  Problem Relation Age of Onset  . Coronary artery disease Unknown     Social History:  Social History  Socioeconomic History  . Marital status: Married    Spouse name: Not on file  . Number of children: Not on file  . Years of education: Not on file  . Highest education level: Not on file  Occupational History  . Not on file  Social Needs  . Financial resource strain: Not on file  . Food insecurity:    Worry: Not on file    Inability: Not on file  . Transportation needs:    Medical: Not on file    Non-medical: Not on file  Tobacco Use  . Smoking status: Former Smoker    Packs/day: 0.50    Years: 58.00    Pack years: 29.00    Types: Cigarettes    Last attempt to quit: 07/26/2010    Years since quitting: 7.3  .  Smokeless tobacco: Never Used  Substance and Sexual Activity  . Alcohol use: No    Alcohol/week: 0.0 oz  . Drug use: No  . Sexual activity: Never  Lifestyle  . Physical activity:    Days per week: Not on file    Minutes per session: Not on file  . Stress: Not on file  Relationships  . Social connections:    Talks on phone: Not on file    Gets together: Not on file    Attends religious service: Not on file    Active member of club or organization: Not on file    Attends meetings of clubs or organizations: Not on file    Relationship status: Not on file  Other Topics Concern  . Not on file  Social History Narrative  . Not on file    Allergies:  Allergies  Allergen Reactions  . Azithromycin     Per MAR  . Penicillins     Tolerated Rocephin (July 2015)  . Tetracycline     Per MAR    Metabolic Disorder Labs: No results found for: HGBA1C, MPG No results found for: PROLACTIN Lab Results  Component Value Date   CHOL  09/06/2008    164        ATP III CLASSIFICATION:  <200     mg/dL   Desirable  161-096  mg/dL   Borderline High  >=045    mg/dL   High          TRIG 86 09/06/2008   HDL 49 09/06/2008   CHOLHDL 3.3 09/06/2008   VLDL 17 09/06/2008   LDLCALC  09/06/2008    98        Total Cholesterol/HDL:CHD Risk Coronary Heart Disease Risk Table                     Men   Women  1/2 Average Risk   3.4   3.3  Average Risk       5.0   4.4  2 X Average Risk   9.6   7.1  3 X Average Risk  23.4   11.0        Use the calculated Patient Ratio above and the CHD Risk Table to determine the patient's CHD Risk.        ATP III CLASSIFICATION (LDL):  <100     mg/dL   Optimal  409-811  mg/dL   Near or Above                    Optimal  130-159  mg/dL   Borderline  914-782  mg/dL   High  >956  mg/dL   Very High   No results found for: TSH  Therapeutic Level Labs: No results found for: LITHIUM No results found for: VALPROATE No components found for:  CBMZ  Current  Medications: Current Outpatient Medications  Medication Sig Dispense Refill  . albuterol (PROVENTIL HFA;VENTOLIN HFA) 108 (90 BASE) MCG/ACT inhaler Inhale 2 puffs into the lungs every 6 (six) hours as needed for wheezing or shortness of breath.    Marland Kitchen amLODipine (NORVASC) 10 MG tablet Take 5 mg by mouth daily.     Marland Kitchen atorvastatin (LIPITOR) 10 MG tablet TAKE 1 TABLET BY MOUTH AT BEDTIME. 47 tablet 10  . busPIRone (BUSPAR) 10 MG tablet Take 1 tablet (10 mg total) by mouth 3 (three) times daily as needed (anxiety). 270 tablet 0  . escitalopram (LEXAPRO) 10 MG tablet Take 1 tablet (10 mg total) by mouth daily. 90 tablet 0  . furosemide (LASIX) 40 MG tablet Take by mouth. Taking 1.5 tabs QD    . GLIPIZIDE PO Take 2 mg by mouth daily.    Marland Kitchen HYDROcodone-acetaminophen (NORCO/VICODIN) 5-325 MG per tablet Take 1 tablet by mouth every 4 (four) hours as needed for moderate pain. (Patient taking differently: Take 1 tablet by mouth 2 (two) times daily. ) 30 tablet 0  . ipratropium-albuterol (DUONEB) 0.5-2.5 (3) MG/3ML SOLN Take 3 mLs by nebulization 4 (four) times daily as needed. Shortness of breath    . lamoTRIgine (LAMICTAL) 100 MG tablet Take 1 tablet (100 mg total) by mouth at bedtime. 30 tablet 0  . memantine (NAMENDA XR) 14 MG CP24 24 hr capsule Take 14 mg by mouth daily.    . metoprolol tartrate (LOPRESSOR) 25 MG tablet TAKE (1/2) TABLET BY MOUTH 2 TIMES A DAY. 30 tablet 0  . metoprolol tartrate (LOPRESSOR) 25 MG tablet TAKE (1/2) TABLET BY MOUTH TWICE DAILY. 30 tablet 2  . metoprolol tartrate (LOPRESSOR) 25 MG tablet TAKE (1/2) TABLET BY MOUTH TWICE DAILY. 47 tablet 10  . mirtazapine (REMERON) 15 MG tablet Take 1 tablet (15 mg total) by mouth at bedtime. 90 tablet 0  . omeprazole (PRILOSEC) 40 MG capsule Take 20 mg by mouth daily.     . QUEtiapine (SEROQUEL) 25 MG tablet Take 1 tablet (25 mg total) by mouth at bedtime. 90 tablet 0  . solifenacin (VESICARE) 10 MG tablet Take 10 mg by mouth daily.        No current facility-administered medications for this visit.      Musculoskeletal: Strength & Muscle Tone: decreased Gait & Station: in a wheelchair Patient leans: N/A  Psychiatric Specialty Exam: Review of Systems  Psychiatric/Behavioral: Positive for memory loss. Negative for depression, hallucinations, substance abuse and suicidal ideas. The patient has insomnia. The patient is not nervous/anxious.   All other systems reviewed and are negative.   Blood pressure 126/76, pulse 67, height 5\' 4"  (1.626 m), SpO2 95 %.Body mass index is 34.16 kg/m.  General Appearance: Fairly Groomed  Eye Contact:  Good  Speech:  Clear and Coherent  Volume:  Normal  Mood:  "high"  Affect:  Appropriate, Congruent and calm  Thought Process:  Coherent, occasionally irrelevant  Orientation:  Full (Time, Place, and Person)  Thought Content: no paranoia   Suicidal Thoughts:  No  Homicidal Thoughts:  No  Memory:  Immediate;   Fair  Judgement:  Fair  Insight:  Present  Psychomotor Activity:  Normal  Concentration:  Concentration: Fair and Attention Span: Fair  Recall:  [employer] of Knowledge: Good  Language: Good  Akathisia:  No  Handed:  Right  AIMS (if indicated): not done  Assets:  Social Support  ADL's:  Intact  Cognition: Impaired,  Mild  Sleep:  Poor   Screenings: PHQ2-9     Office Visit from 04/09/2014 in [facility] for Infectious Disease  PHQ-2 Total Score  0      GDS-810/2018  MOCA: 12/30 on 12/28/2016. (-4 for visuospatial, -1 for naming, -4 for attention, -2 for language, -1 for abstraction, -5 for delayed recall, -1 for orientation "6th")  Assessment and Plan:  TAWNIA SCHIRM is a 77 y.o. year old female with a history of depression, unspecified neurocognitive disorder, hypertension, CAD,COPD, TIA, type II DM, , who presents for follow up appointment for Bipolar II disorder (HCC)  # Bipolar II disorder Patient reports subthreshold hypomanic symptoms  since the last appointment.  Will uptitrate lamotrigine to target mood dysregulation.  Will continue mirtazapine, Lexapro for depression.  Will continue quetiapine for mood dysregulation.  Discussed risks, including, but not limited to metabolic side effect.   # Unspecified neurocognitive disorder She does have cognitive impairment. She is on memantine, prescribed by PCP. Will continue to monitor.   Plan I have reviewed and updated plans as below 1. Continue mirtazapine 15 mg at night 2. Continue lexapro 10 mg daily  3.Increaselamotrigine100mg  daily  4. Continue quetiapine 25 mg night 5. Continue buspar 10 mg three times a day as needed for anxiety  6. Return to clinic in one month for30 mins 7.(She is on memantine 14 mg daily)  The patient demonstrates the following risk factors for suicide: Chronic risk factors for suicide include: psychiatric disorder of bipolar disorder. Acute risk factorsfor suicide include: unemployment and social withdrawal/isolation. Protective factorsfor this patient include: positive social support, coping skills. Considering these factors, the overall suicide risk at this point appears to be low. Patient isappropriate for outpatient follow up.  The duration of this appointment visit was 30 minutes of face-to-face time with the patient.  Greater than 50% of this time was spent in counseling, explanation of  diagnosis, planning of further management, and coordination of care.  Neysa Hotter, MD 12/15/2017, 11:02 AM

## 2017-12-15 NOTE — Patient Instructions (Signed)
1. Continue mirtazapine 15 mg at night 2. Continue lexapro 10 mg daily  3.Increaselamotrigine100mg  daily  4. Continue quetiapine 25 mg night 5. Continue buspar 10 mg three times a day as needed for anxiety  6. Return to clinic in one month for30 mins

## 2018-01-02 ENCOUNTER — Other Ambulatory Visit (HOSPITAL_COMMUNITY): Payer: Self-pay | Admitting: Psychiatry

## 2018-01-02 MED ORDER — LAMOTRIGINE 100 MG PO TABS: 100.0000 mg | ORAL_TABLET | Freq: Every day | ORAL | 0 refills | Status: DC

## 2018-01-10 ENCOUNTER — Ambulatory Visit (INDEPENDENT_AMBULATORY_CARE_PROVIDER_SITE_OTHER): Payer: Medicare Other | Admitting: Psychiatry

## 2018-01-10 DIAGNOSIS — F3181 Bipolar II disorder: Secondary | ICD-10-CM

## 2018-01-10 DIAGNOSIS — F331 Major depressive disorder, recurrent, moderate: Secondary | ICD-10-CM | POA: Diagnosis not present

## 2018-01-10 NOTE — Progress Notes (Signed)
      THERAPIST PROGRESS NOTE                   Session Time: Tuesday 01/10/2018 3:03 PM -   3:50 PM                                         Participation Level: Active  Behavioral Response: CasualAlert/improved mood, talkative  Type of Therapy: Individual Therapy  Treatment Goals addressed: learn how to cope with feelings of depression   Interventions: Supportive/CBT  Summary: Brenda Mcdonald is a 77 y.o. female who is referred for services by psychiatrist Dr. Vanetta Shawl  due to patient experiencing symptoms of depression. She has a history of multiple psychiatric admissions for depression and hallucinations. She recently began seeing psychiatrist Dr. Vanetta Shawl for medication management. Patient states she has had nerve problems all of her life and says she has been in and out of the hospital. She reports needing somebody to talk to and says she also has depression at times.  Patient reports experiencing sleep difficulty and racing thoughts a few weeks ago. However,she reports symptoms have improved since taking increased dosage of medication as instructed by Dr. Vanetta Shawl. She is pleased about recent meeting about getting a hearing aid. She is hopeful she will get hearing aid soon so she can be more engaged in conversations. She is looking forward to her phone being equipped later this month to increase her hearing. She reports enjoying going to movies with her grandchildren this past weekend. She also plans to begin another arts project in the near future. She expresses worry about her husband doesn't act like he used to. She says he is sick and starting to act like he did before he had a stroke 2-3 years ago. She worries about something happening to him and what would happen to her if something does happen. She has tried to encourage husband to go to doctor before his scheduled appointment but he refuses per her report.   Therapist Response:  Reviewed symptoms, praised and reinforced patient's continued   involvement in activity and socialization, discussed effects on thoughts/mood/behavior,discussed concerns about husband, validated feelings, assisted  patient identify ways to decrease worry time by establishing designated worry time, identified coping statements and distracting activities to use to adhere to designated worry time, assigned patient to implement plan developed in session.    Plan: Return again in 4  weeks.  Diagnosis: Axis I: Major depressive disorder, recurrent, moderate    Axis II: Deferred    BYNUM,PEGGY, LCSW 01/10/2018

## 2018-01-12 NOTE — Progress Notes (Deleted)
BH MD/PA/NP OP Progress Note  01/12/2018 9:55 AM Brenda Mcdonald  MRN:  [id number]  Chief Complaint:  HPI: *** Visit Diagnosis: No diagnosis found.  Past Psychiatric History: Please see initial evaluation for full details. I have reviewed the history. No updates at this time.     Past Medical History:  Past Medical History:  Diagnosis Date  . Anxiety   . Arthritis   . Bipolar disorder (HCC)   . COPD (chronic obstructive pulmonary disease) (HCC)   . Coronary atherosclerosis of native coronary artery    Nonobstructive  . Depression   . Diabetes mellitus, type 2 (HCC)   . Dysphagia   . Essential hypertension, benign   . GERD (gastroesophageal reflux disease)   . Pneumonia   . Pulmonary nodule July 2009   Nonspecific 5 mm right middle lobe  . TIA (transient ischemic attack)     Past Surgical History:  Procedure Laterality Date  . ABDOMINAL HYSTERECTOMY    . APPENDECTOMY    . CARDIAC CATHETERIZATION  11/2010  . CATARACT EXTRACTION, BILATERAL Bilateral   . CHOLECYSTECTOMY    . DILATION AND CURETTAGE OF UTERUS    . INCISION AND DRAINAGE Left 03/12/2014   Procedure: LEFT INCISION/DRAINAGE DEEP ABSCESS/BURSA/HEMATOMA THIGH/KNEE REGION;  Surgeon: [name], MD;  Location: [facility] OR;  Service: Orthopedics;  Laterality: Left;  . KNEE ARTHROSCOPY Left   . ORIF DISTAL FEMUR FRACTURE Left 01/24/2014  . ORIF FEMUR FRACTURE Left 01/24/2014   Procedure: OPEN REDUCTION INTERNAL FIXATION (ORIF) DISTAL FEMUR FRACTURE;  Surgeon: [name], MD;  Location: [facility] OR;  Service: Orthopedics;  Laterality: Left;  . TIBIA FRACTURE SURGERY Left ~ 1959  . TOTAL KNEE ARTHROPLASTY Left 2000  . VESICOVAGINAL FISTULA CLOSURE W/ TAH      Family Psychiatric History: Please see initial evaluation for full details. I have reviewed the history. No updates at this time.     Family History:  Family History  Problem Relation Age of Onset  . Coronary artery disease Unknown     Social History:  Social  History   Socioeconomic History  . Marital status: Married    Spouse name: Not on file  . Number of children: Not on file  . Years of education: Not on file  . Highest education level: Not on file  Occupational History  . Not on file  Social Needs  . Financial resource strain: Not on file  . Food insecurity:    Worry: Not on file    Inability: Not on file  . Transportation needs:    Medical: Not on file    Non-medical: Not on file  Tobacco Use  . Smoking status: Former Smoker    Packs/day: 0.50    Years: 58.00    Pack years: 29.00    Types: Cigarettes    Last attempt to quit: 07/26/2010    Years since quitting: 7.4  . Smokeless tobacco: Never Used  Substance and Sexual Activity  . Alcohol use: No    Alcohol/week: 0.0 oz  . Drug use: No  . Sexual activity: Never  Lifestyle  . Physical activity:    Days per week: Not on file    Minutes per session: Not on file  . Stress: Not on file  Relationships  . Social connections:    Talks on phone: Not on file    Gets together: Not on file    Attends religious service: Not on file    Active member of club or  organization: Not on file    Attends meetings of clubs or organizations: Not on file    Relationship status: Not on file  Other Topics Concern  . Not on file  Social History Narrative  . Not on file    Allergies:  Allergies  Allergen Reactions  . Azithromycin     Per MAR  . Penicillins     Tolerated Rocephin (July 2015)  . Tetracycline     Per MAR    Metabolic Disorder Labs: No results found for: HGBA1C, MPG No results found for: PROLACTIN Lab Results  Component Value Date   CHOL  09/06/2008    164        ATP III CLASSIFICATION:  <200     mg/dL   Desirable  272-536  mg/dL   Borderline High  >=644    mg/dL   High          TRIG 86 09/06/2008   HDL 49 09/06/2008   CHOLHDL 3.3 09/06/2008   VLDL 17 09/06/2008   LDLCALC  09/06/2008    98        Total Cholesterol/HDL:CHD Risk Coronary Heart Disease Risk  Table                     Men   Women  1/2 Average Risk   3.4   3.3  Average Risk       5.0   4.4  2 X Average Risk   9.6   7.1  3 X Average Risk  23.4   11.0        Use the calculated Patient Ratio above and the CHD Risk Table to determine the patient's CHD Risk.        ATP III CLASSIFICATION (LDL):  <100     mg/dL   Optimal  034-742  mg/dL   Near or Above                    Optimal  130-159  mg/dL   Borderline  595-638  mg/dL   High  >756     mg/dL   Very High   No results found for: TSH  Therapeutic Level Labs: No results found for: LITHIUM No results found for: VALPROATE No components found for:  CBMZ  Current Medications: Current Outpatient Medications  Medication Sig Dispense Refill  . albuterol (PROVENTIL HFA;VENTOLIN HFA) 108 (90 BASE) MCG/ACT inhaler Inhale 2 puffs into the lungs every 6 (six) hours as needed for wheezing or shortness of breath.    Marland Kitchen amLODipine (NORVASC) 10 MG tablet Take 5 mg by mouth daily.     Marland Kitchen atorvastatin (LIPITOR) 10 MG tablet TAKE 1 TABLET BY MOUTH AT BEDTIME. 47 tablet 10  . busPIRone (BUSPAR) 10 MG tablet Take 1 tablet (10 mg total) by mouth 3 (three) times daily as needed (anxiety). 270 tablet 0  . escitalopram (LEXAPRO) 10 MG tablet Take 1 tablet (10 mg total) by mouth daily. 90 tablet 0  . furosemide (LASIX) 40 MG tablet Take by mouth. Taking 1.5 tabs QD    . GLIPIZIDE PO Take 2 mg by mouth daily.    Marland Kitchen HYDROcodone-acetaminophen (NORCO/VICODIN) 5-325 MG per tablet Take 1 tablet by mouth every 4 (four) hours as needed for moderate pain. (Patient taking differently: Take 1 tablet by mouth 2 (two) times daily. ) 30 tablet 0  . ipratropium-albuterol (DUONEB) 0.5-2.5 (3) MG/3ML SOLN Take 3 mLs by nebulization 4 (four) times daily as needed. Shortness  of breath    . lamoTRIgine (LAMICTAL) 100 MG tablet Take 1 tablet (100 mg total) by mouth at bedtime. 21 tablet 0  . memantine (NAMENDA XR) 14 MG CP24 24 hr capsule Take 14 mg by mouth daily.    .  metoprolol tartrate (LOPRESSOR) 25 MG tablet TAKE (1/2) TABLET BY MOUTH 2 TIMES A DAY. 30 tablet 0  . metoprolol tartrate (LOPRESSOR) 25 MG tablet TAKE (1/2) TABLET BY MOUTH TWICE DAILY. 30 tablet 2  . metoprolol tartrate (LOPRESSOR) 25 MG tablet TAKE (1/2) TABLET BY MOUTH TWICE DAILY. 47 tablet 10  . mirtazapine (REMERON) 15 MG tablet Take 1 tablet (15 mg total) by mouth at bedtime. 90 tablet 0  . omeprazole (PRILOSEC) 40 MG capsule Take 20 mg by mouth daily.     . QUEtiapine (SEROQUEL) 25 MG tablet Take 1 tablet (25 mg total) by mouth at bedtime. 90 tablet 0  . solifenacin (VESICARE) 10 MG tablet Take 10 mg by mouth daily.       No current facility-administered medications for this visit.      Musculoskeletal: Strength & Muscle Tone: within normal limits Gait & Station: normal Patient leans: N/A  Psychiatric Specialty Exam: ROS  There were no vitals taken for this visit.There is no height or weight on file to calculate BMI.  General Appearance: Fairly Groomed  Eye Contact:  Good  Speech:  Clear and Coherent  Volume:  Normal  Mood:  {BHH MOOD:22306}  Affect:  {Affect (PAA):22687}  Thought Process:  Coherent  Orientation:  Full (Time, Place, and Person)  Thought Content: Logical   Suicidal Thoughts:  {ST/HT (PAA):22692}  Homicidal Thoughts:  {ST/HT (PAA):22692}  Memory:  Immediate;   Fair  Judgement:  {Judgement (PAA):22694}  Insight:  {Insight (PAA):22695}  Psychomotor Activity:  Normal  Concentration:  Concentration: Fair and Attention Span: Fair  Recall:  Fair  Fund of Knowledge: Good  Language: Good  Akathisia:  No  Handed:  Right  AIMS (if indicated): not done  Assets:  Desire for Improvement  ADL's:  Intact  Cognition: Impaired,  Mild  Sleep:  {BHH GOOD/FAIR/POOR:22877}   Screenings: PHQ2-9     [facility] Visit from 04/09/[facility] in [facility] for Infectious Disease  PHQ-2 Total Score  0     GDS-810/2018  MOCA: 12/30 on 12/28/2016. (-4 for  visuospatial, -1 for naming, -4 for attention, -2 for language, -1 for abstraction, -5 for delayed recall, -1 for orientation "6th")  Assessment and Plan:  Brenda Mcdonald is a 77 y.o. year old female with a history of bipolar II disorder, unspecified neurocognitive disorder, hypertension, CAD,COPD, TIA, type II DM , who presents for follow up appointment for No diagnosis found.  # Bipolar II disorder  Patient reports subthreshold hypomanic symptoms since the last appointment.  Will uptitrate lamotrigine to target mood dysregulation.  Will continue mirtazapine, Lexapro for depression.  Will continue quetiapine for mood dysregulation.  Discussed risks, including, but not limited to metabolic side effect.   # Unspecified neurocognitive disorder She does have cognitive impairment. She is on memantine, prescribed by PCP. Will continue to monitor.   Plan  1. Continue mirtazapine 15 mg at night 2. Continue lexapro 10 mg daily  3.Increaselamotrigine100mg  daily 4. Continue quetiapine 25 mg night 5. Continue buspar 10 mg three times a day as needed for anxiety  6. Return to clinic in one month for30 mins 7.(She is on memantine 14 mg daily)  The patient demonstrates the following risk factors for  suicide: Chronic risk factors for suicide include: psychiatric disorder of bipolar disorder. Acute risk factorsfor suicide include: unemployment and social withdrawal/isolation. Protective factorsfor this patient include: positive social support, coping skills. Considering these factors, the overall suicide risk at this point appears to be low. Patient isappropriate for outpatient follow up.    Neysa Hotter, MD 01/12/2018, 9:55 AM

## 2018-01-16 ENCOUNTER — Ambulatory Visit (HOSPITAL_COMMUNITY): Payer: Medicare Other | Admitting: Psychiatry

## 2018-01-20 DIAGNOSIS — Z6833 Body mass index (BMI) 33.0-33.9, adult: Secondary | ICD-10-CM | POA: Diagnosis not present

## 2018-01-20 DIAGNOSIS — E1142 Type 2 diabetes mellitus with diabetic polyneuropathy: Secondary | ICD-10-CM | POA: Diagnosis not present

## 2018-01-20 DIAGNOSIS — Z299 Encounter for prophylactic measures, unspecified: Secondary | ICD-10-CM | POA: Diagnosis not present

## 2018-01-20 DIAGNOSIS — Z79899 Other long term (current) drug therapy: Secondary | ICD-10-CM | POA: Diagnosis not present

## 2018-01-20 DIAGNOSIS — M545 Low back pain: Secondary | ICD-10-CM | POA: Diagnosis not present

## 2018-01-20 DIAGNOSIS — N183 Chronic kidney disease, stage 3 (moderate): Secondary | ICD-10-CM | POA: Diagnosis not present

## 2018-01-20 DIAGNOSIS — I10 Essential (primary) hypertension: Secondary | ICD-10-CM | POA: Diagnosis not present

## 2018-01-20 DIAGNOSIS — N390 Urinary tract infection, site not specified: Secondary | ICD-10-CM | POA: Diagnosis not present

## 2018-01-20 DIAGNOSIS — R300 Dysuria: Secondary | ICD-10-CM | POA: Diagnosis not present

## 2018-01-20 DIAGNOSIS — E1165 Type 2 diabetes mellitus with hyperglycemia: Secondary | ICD-10-CM | POA: Diagnosis not present

## 2018-02-08 ENCOUNTER — Telehealth: Payer: Self-pay | Admitting: Cardiology

## 2018-02-08 NOTE — Telephone Encounter (Signed)
Numerous attempts to contact patient with recall letters. Unable to reach by telephone. with no success.   User: Time: Status:    Megan Salon [[id number]] 03/11/2016 3:01 PM New [10]   [System] 11/23/2016 11:03 PM Notification Sent [20]   Geraldine Contras [[id number]] 11/24/2017 9:05 AM Notification Sent [20]   Megan Salon [[id number]] 02/08/2018 4:18 PM Notification Sent [20]

## 2018-02-09 NOTE — Progress Notes (Addendum)
BH MD/PA/NP OP Progress Note  02/13/2018 2:05 PM Brenda Mcdonald  MRN:  [id number]  Chief Complaint:  Chief Complaint    Follow-up; Other     HPI:  Patient presents late for the follow up appointment for bipolar disorder. She came here by herself today. She states that she does not feel manic that she used to.  She feels low. She talks about her husband, who cannot walk by himself. She wants to "go to other place" so that she does not need to deal with things, although she also states that she has no place to go. She then asks that "what would you do for my mental health." She has not had any fun nor active for the last few months. She sleeps better. She feels fatigue. She enjoys reading. She has fair appetite. She denies SI. She feels anxious, tense at times. She denies panic attacks. She denies decreased need for sleep, euphoria.    Wt Readings from Last 3 Encounters:  02/13/18 207 lb (93.9 kg)  11/14/17 199 lb (90.3 kg)  08/11/17 212 lb (96.2 kg)    Visit Diagnosis:    ICD-10-CM   1. Bipolar II disorder (HCC) F31.81     Past Psychiatric History: Please see initial evaluation for full details. I have reviewed the history. No updates at this time.     Past Medical History:  Past Medical History:  Diagnosis Date  . Anxiety   . Arthritis   . Bipolar disorder (HCC)   . COPD (chronic obstructive pulmonary disease) (HCC)   . Coronary atherosclerosis of native coronary artery    Nonobstructive  . Depression   . Diabetes mellitus, type 2 (HCC)   . Dysphagia   . Essential hypertension, benign   . GERD (gastroesophageal reflux disease)   . Pneumonia   . Pulmonary nodule July 2009   Nonspecific 5 mm right middle lobe  . TIA (transient ischemic attack)     Past Surgical History:  Procedure Laterality Date  . ABDOMINAL HYSTERECTOMY    . APPENDECTOMY    . CARDIAC CATHETERIZATION  11/2010  . CATARACT EXTRACTION, BILATERAL Bilateral   . CHOLECYSTECTOMY    . DILATION AND  CURETTAGE OF UTERUS    . INCISION AND DRAINAGE Left 03/12/2014   Procedure: LEFT INCISION/DRAINAGE DEEP ABSCESS/BURSA/HEMATOMA THIGH/KNEE REGION;  Surgeon: [name], MD;  Location: [facility];  Service: Orthopedics;  Laterality: Left;  . KNEE ARTHROSCOPY Left   . ORIF DISTAL FEMUR FRACTURE Left 01/24/2014  . ORIF FEMUR FRACTURE Left 01/24/2014   Procedure: OPEN REDUCTION INTERNAL FIXATION (ORIF) DISTAL FEMUR FRACTURE;  Surgeon: [name], MD;  Location: [facility] OR;  Service: Orthopedics;  Laterality: Left;  . TIBIA FRACTURE SURGERY Left ~ 1959  . TOTAL KNEE ARTHROPLASTY Left 2000  . VESICOVAGINAL FISTULA CLOSURE W/ TAH      Family Psychiatric History: Please see initial evaluation for full details. I have reviewed the history. No updates at this time.     Family History:  Family History  Problem Relation Age of Onset  . Coronary artery disease Unknown     Social History:  Social History   Socioeconomic History  . Marital status: Married    Spouse name: Not on file  . Number of children: Not on file  . Years of education: Not on file  . Highest education level: Not on file  Occupational History  . Not on file  Social Needs  . Financial resource strain: Not on file  .  Food insecurity:    Worry: Not on file    Inability: Not on file  . Transportation needs:    Medical: Not on file    Non-medical: Not on file  Tobacco Use  . Smoking status: Former Smoker    Packs/day: 0.50    Years: 58.00    Pack years: 29.00    Types: Cigarettes    Last attempt to quit: 07/26/2010    Years since quitting: 7.5  . Smokeless tobacco: Never Used  Substance and Sexual Activity  . Alcohol use: No    Alcohol/week: 0.0 oz  . Drug use: No  . Sexual activity: Never  Lifestyle  . Physical activity:    Days per week: Not on file    Minutes per session: Not on file  . Stress: Not on file  Relationships  . Social connections:    Talks on phone: Not on file    Gets together: Not on file     Attends religious service: Not on file    Active member of club or organization: Not on file    Attends meetings of clubs or organizations: Not on file    Relationship status: Not on file  Other Topics Concern  . Not on file  Social History Narrative  . Not on file    Allergies:  Allergies  Allergen Reactions  . Azithromycin     Per MAR  . Penicillins     Tolerated Rocephin (July 2015)  . Tetracycline     Per MAR    Metabolic Disorder Labs: No results found for: HGBA1C, MPG No results found for: PROLACTIN Lab Results  Component Value Date   CHOL  09/06/2008    164        ATP III CLASSIFICATION:  <200     mg/dL   Desirable  592-924  mg/dL   Borderline High  >=462    mg/dL   High          TRIG 86 09/06/2008   HDL 49 09/06/2008   CHOLHDL 3.3 09/06/2008   VLDL 17 09/06/2008   LDLCALC  09/06/2008    98        Total Cholesterol/HDL:CHD Risk Coronary Heart Disease Risk Table                     Men   Women  1/2 Average Risk   3.4   3.3  Average Risk       5.0   4.4  2 X Average Risk   9.6   7.1  3 X Average Risk  23.4   11.0        Use the calculated Patient Ratio above and the CHD Risk Table to determine the patient's CHD Risk.        ATP III CLASSIFICATION (LDL):  <100     mg/dL   Optimal  863-817  mg/dL   Near or Above                    Optimal  130-159  mg/dL   Borderline  711-657  mg/dL   High  >903     mg/dL   Very High   No results found for: TSH  Therapeutic Level Labs: No results found for: LITHIUM No results found for: VALPROATE No components found for:  CBMZ  Current Medications: Current Outpatient Medications  Medication Sig Dispense Refill  . albuterol (PROVENTIL HFA;VENTOLIN HFA) 108 (90 BASE) MCG/ACT inhaler Inhale 2  puffs into the lungs every 6 (six) hours as needed for wheezing or shortness of breath.    Marland Kitchen amLODipine (NORVASC) 10 MG tablet Take 5 mg by mouth daily.     Marland Kitchen atorvastatin (LIPITOR) 10 MG tablet TAKE 1 TABLET BY MOUTH AT  BEDTIME. 47 tablet 10  . busPIRone (BUSPAR) 10 MG tablet Take 1 tablet (10 mg total) by mouth 3 (three) times daily as needed (anxiety). 270 tablet 0  . escitalopram (LEXAPRO) 10 MG tablet Take 1 tablet (10 mg total) by mouth daily. 90 tablet 0  . furosemide (LASIX) 40 MG tablet Take by mouth. Taking 1.5 tabs QD    . GLIPIZIDE PO Take 2 mg by mouth daily.    Marland Kitchen HYDROcodone-acetaminophen (NORCO/VICODIN) 5-325 MG per tablet Take 1 tablet by mouth every 4 (four) hours as needed for moderate pain. (Patient taking differently: Take 1 tablet by mouth 2 (two) times daily. ) 30 tablet 0  . ipratropium-albuterol (DUONEB) 0.5-2.5 (3) MG/3ML SOLN Take 3 mLs by nebulization 4 (four) times daily as needed. Shortness of breath    . lamoTRIgine (LAMICTAL) 100 MG tablet Take 1 tablet (100 mg total) by mouth at bedtime. 90 tablet 0  . memantine (NAMENDA XR) 14 MG CP24 24 hr capsule Take 14 mg by mouth daily.    . metoprolol tartrate (LOPRESSOR) 25 MG tablet TAKE (1/2) TABLET BY MOUTH 2 TIMES A DAY. 30 tablet 0  . metoprolol tartrate (LOPRESSOR) 25 MG tablet TAKE (1/2) TABLET BY MOUTH TWICE DAILY. 30 tablet 2  . metoprolol tartrate (LOPRESSOR) 25 MG tablet TAKE (1/2) TABLET BY MOUTH TWICE DAILY. 47 tablet 10  . mirtazapine (REMERON) 15 MG tablet Take 1 tablet (15 mg total) by mouth at bedtime. 90 tablet 0  . omeprazole (PRILOSEC) 40 MG capsule Take 20 mg by mouth daily.     . QUEtiapine (SEROQUEL) 25 MG tablet Take 1 tablet (25 mg total) by mouth at bedtime. 90 tablet 0  . solifenacin (VESICARE) 10 MG tablet Take 10 mg by mouth daily.       No current facility-administered medications for this visit.      Musculoskeletal: Strength & Muscle Tone: within normal limits Gait & Station: normal Patient leans: N/A  Psychiatric Specialty Exam: ROS  Blood pressure 111/65, pulse 64, height 5\' 4"  (1.626 m), weight 207 lb (93.9 kg), SpO2 93 %.Body mass index is 35.53 kg/m.  General Appearance: Fairly Groomed  Eye  Contact:  Good  Speech:  Clear and Coherent  Volume:  Normal  Mood:  Depressed  Affect:  Appropriate, Congruent and calm  Thought Process:  Coherent  Orientation:  Full (Time, Place, and Person)  Thought Content: Logical   Suicidal Thoughts:  No  Homicidal Thoughts:  No  Memory:  Immediate;   Good  Judgement:  Fair  Insight:  Present  Psychomotor Activity:  Normal  Concentration:  Concentration: Good and Attention Span: Good  Recall:  Fair  Fund of Knowledge: Good  Language: Good  Akathisia:  No  Handed:  Right  AIMS (if indicated): not done  Assets:  Communication Skills Desire for Improvement  ADL's:  Intact  Cognition: WNL  Sleep:  Good   Screenings: PHQ2-9     Office Visit from 04/09/2014 in [facility] for Infectious Disease  PHQ-2 Total Score  0      GDS-810/2018  MOCA: 12/30 on 12/28/2016. (-4 for visuospatial, -1 for naming, -4 for attention, -2 for language, -1 for abstraction, -5 for  delayed recall, -1 for orientation "6th")  Assessment and Plan:  Brenda Mcdonald is a 77 y.o. year old female with a history of  Depression, unspecified neurocognitive disorder, hypertension, CAD,COPD, TIA, type II DM , who presents for follow up appointment for Bipolar II disorder (HCC)  # Bipolar II disorder The patient reports overall improvement in hypomanic symptoms, although she continues to feel depressed at times.  Will continue lamotrigine to target mood dysregulation. Discussed risks, including, but not limited to [employer] syndrome. Will continue lexapro, mirtazapine to target depression. Will continue quetiapine for mood dysregulation. Discussed risks, including, but not limited to metabolic side effect. Will continue buspar for anxiety. Discussed behavioral activation.   # unspecified neurocognitive disorder She has cognitive deficits a characterized by MOCA. Will continue memantine, prescribed by PCP.   Plan I have reviewed and updated plans  as below 1. Continue mirtazapine 15 mg at night 2. Continue lexapro 10 mg daily  3.Continuelamotrigine100mg  daily 4. Continue quetiapine 25 mg night 5. Continue buspar 10 mg three times a day as needed for anxiety  6. Return to clinic in three months for 30 mins 7.(She is on memantine 14 mg daily)  The patient demonstrates the following risk factors for suicide: Chronic risk factors for suicide include: psychiatric disorder of bipolar disorder. Acute risk factorsfor suicide include: unemployment and social withdrawal/isolation. Protective factorsfor this patient include: positive social support, coping skills. Considering these factors, the overall suicide risk at this point appears to be low. Patient isappropriate for outpatient follow up.  The duration of this appointment visit was 30 minutes of face-to-face time with the patient.  Greater than 50% of this time was spent in counseling, explanation of  diagnosis, planning of further management, and coordination of care.  Neysa Hotter, MD 02/13/2018, 2:05 PM

## 2018-02-13 ENCOUNTER — Encounter (HOSPITAL_COMMUNITY): Payer: Self-pay | Admitting: Psychiatry

## 2018-02-13 ENCOUNTER — Ambulatory Visit (INDEPENDENT_AMBULATORY_CARE_PROVIDER_SITE_OTHER): Payer: Medicare Other | Admitting: Psychiatry

## 2018-02-13 VITALS — BP 111/65 | HR 64 | Ht 64.0 in | Wt 207.0 lb

## 2018-02-13 DIAGNOSIS — Z87891 Personal history of nicotine dependence: Secondary | ICD-10-CM

## 2018-02-13 DIAGNOSIS — F3181 Bipolar II disorder: Secondary | ICD-10-CM

## 2018-02-13 MED ORDER — MIRTAZAPINE 15 MG PO TABS
15.0000 mg | ORAL_TABLET | Freq: Every day | ORAL | 0 refills | Status: DC
Start: 2018-02-13 — End: 2018-05-10

## 2018-02-13 MED ORDER — QUETIAPINE FUMARATE 25 MG PO TABS: 25.0000 mg | ORAL_TABLET | Freq: Every day | ORAL | 0 refills | Status: DC

## 2018-02-13 MED ORDER — ESCITALOPRAM OXALATE 10 MG PO TABS: 10.0000 mg | ORAL_TABLET | Freq: Every day | ORAL | 0 refills | Status: DC

## 2018-02-13 MED ORDER — BUSPIRONE HCL 10 MG PO TABS: 10.0000 mg | ORAL_TABLET | Freq: Three times a day (TID) | ORAL | 0 refills | Status: DC | PRN

## 2018-02-13 MED ORDER — LAMOTRIGINE 100 MG PO TABS: 100.0000 mg | ORAL_TABLET | Freq: Every day | ORAL | 0 refills | Status: DC

## 2018-02-13 NOTE — Patient Instructions (Addendum)
1. Continue mirtazapine 15 mg at night 2. Continue lexapro 10 mg daily  3. Continuelamotrigine100mg  daily 4. Continue quetiapine 25 mg night 5. Continue buspar 10 mg three times a day as needed for anxiety  6. Return to clinic in three months for 30 mins CONTACT INFORMATION  What to do if you need to get in touch with someone regarding a psychiatric issue:  1. EMERGENCY: For psychiatric emergencies (if you are suicidal or if there are any other safety issues) call 911 and/or go to your nearest Emergency Room immediately.   2. IF YOU NEED SOMEONE TO TALK TO RIGHT NOW: Given my clinical responsibilities, I may not be able to speak with you over the phone for a prolonged period of time.  a. You may always call The National Suicide Prevention Lifeline at 1-800-273-TALK (732)756-1780).  b. Your county of residence will also have local crisis services. For [facility]: [facility] at (364)421-1342 (24 Hour Crisis Hotline)

## 2018-02-17 ENCOUNTER — Other Ambulatory Visit: Payer: Self-pay | Admitting: Cardiology

## 2018-03-06 ENCOUNTER — Other Ambulatory Visit: Payer: Self-pay | Admitting: Cardiology

## 2018-03-16 ENCOUNTER — Ambulatory Visit (INDEPENDENT_AMBULATORY_CARE_PROVIDER_SITE_OTHER): Payer: Medicare Other | Admitting: Psychiatry

## 2018-03-16 DIAGNOSIS — F3181 Bipolar II disorder: Secondary | ICD-10-CM

## 2018-03-16 DIAGNOSIS — F331 Major depressive disorder, recurrent, moderate: Secondary | ICD-10-CM | POA: Diagnosis not present

## 2018-03-16 NOTE — Progress Notes (Signed)
      THERAPIST PROGRESS NOTE                   Session Time: Thursday 03/16/2018 3:10 PM - 4:00 PM                                    Participation Level: Active  Behavioral Response: CasualAlert/depressed  Type of Therapy: Individual Therapy  Treatment Goals addressed: learn how to cope with feelings of depression   Interventions: Supportive/CBT  Summary: Brenda Mcdonald is a 77 y.o. female who is referred for services by psychiatrist Dr. Vanetta Shawl  due to patient experiencing symptoms of depression. She has a history of multiple psychiatric admissions for depression and hallucinations. She recently began seeing psychiatrist Dr. Vanetta Shawl for medication management. Patient states she has had nerve problems all of her life and says she has been in and out of the hospital. She reports needing somebody to talk to and says she also has depression at times.  Patient last was seen two months ago.   She reports increased depressed mood but can't identify any triggers. She reports decreased interest in activities. She continues to express sadness about being unable to walk and perform tasks like she used to. She is thankful she has been approved to receive hearing aid expresses frustration she still hasn't received it. She reports reluctance to engage in conversation due to hearing difficulty. 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     Therapist Response:  Reviewed symptoms, assisted patient to try to identify triggers of increased depressed mood, discussed connection between thoughts/mood/behavior, discussed patient's values and assisted patient to try to identify value congruent behavior to increase behavioral activation, assisted patient identify and replace unhelpful thoughts about engaging in activity with helpful thoughts,   Plan: Return again in 4  weeks.  Diagnosis: Axis I: Major depressive disorder, recurrent, moderate    Axis II:  Deferred    [name], LCSW 03/16/2018

## 2018-03-17 DIAGNOSIS — E1122 Type 2 diabetes mellitus with diabetic chronic kidney disease: Secondary | ICD-10-CM | POA: Diagnosis not present

## 2018-03-17 DIAGNOSIS — E1165 Type 2 diabetes mellitus with hyperglycemia: Secondary | ICD-10-CM | POA: Diagnosis not present

## 2018-03-17 DIAGNOSIS — N183 Chronic kidney disease, stage 3 (moderate): Secondary | ICD-10-CM | POA: Diagnosis not present

## 2018-03-17 DIAGNOSIS — Z299 Encounter for prophylactic measures, unspecified: Secondary | ICD-10-CM | POA: Diagnosis not present

## 2018-03-17 DIAGNOSIS — I10 Essential (primary) hypertension: Secondary | ICD-10-CM | POA: Diagnosis not present

## 2018-03-17 DIAGNOSIS — Z6835 Body mass index (BMI) 35.0-35.9, adult: Secondary | ICD-10-CM | POA: Diagnosis not present

## 2018-03-17 DIAGNOSIS — M545 Low back pain: Secondary | ICD-10-CM | POA: Diagnosis not present

## 2018-04-04 DIAGNOSIS — F419 Anxiety disorder, unspecified: Secondary | ICD-10-CM | POA: Diagnosis not present

## 2018-04-04 DIAGNOSIS — R5383 Other fatigue: Secondary | ICD-10-CM | POA: Diagnosis not present

## 2018-04-04 DIAGNOSIS — Z7189 Other specified counseling: Secondary | ICD-10-CM | POA: Diagnosis not present

## 2018-04-04 DIAGNOSIS — Z1211 Encounter for screening for malignant neoplasm of colon: Secondary | ICD-10-CM | POA: Diagnosis not present

## 2018-04-04 DIAGNOSIS — Z299 Encounter for prophylactic measures, unspecified: Secondary | ICD-10-CM | POA: Diagnosis not present

## 2018-04-04 DIAGNOSIS — J449 Chronic obstructive pulmonary disease, unspecified: Secondary | ICD-10-CM | POA: Diagnosis not present

## 2018-04-04 DIAGNOSIS — Z6835 Body mass index (BMI) 35.0-35.9, adult: Secondary | ICD-10-CM | POA: Diagnosis not present

## 2018-04-04 DIAGNOSIS — I10 Essential (primary) hypertension: Secondary | ICD-10-CM | POA: Diagnosis not present

## 2018-04-04 DIAGNOSIS — Z0000 Encounter for general adult medical examination without abnormal findings: Secondary | ICD-10-CM | POA: Diagnosis not present

## 2018-04-04 DIAGNOSIS — Z1339 Encounter for screening examination for other mental health and behavioral disorders: Secondary | ICD-10-CM | POA: Diagnosis not present

## 2018-04-04 DIAGNOSIS — F3130 Bipolar disorder, current episode depressed, mild or moderate severity, unspecified: Secondary | ICD-10-CM | POA: Diagnosis not present

## 2018-04-04 DIAGNOSIS — Z1331 Encounter for screening for depression: Secondary | ICD-10-CM | POA: Diagnosis not present

## 2018-04-24 ENCOUNTER — Encounter: Payer: Self-pay | Admitting: *Deleted

## 2018-04-25 NOTE — Progress Notes (Deleted)
Cardiology Office Note  Date: 04/25/2018   ID: Brenda Mcdonald, DOB 1941/01/19, MRN [id number]  PCP: Glenda Chroman, MD  Primary Cardiologist: Rozann Lesches, MD   No chief complaint on file.   History of Present Illness: Brenda Mcdonald is a 77 y.o. female not seen in the office since August 2017.  Past Medical History:  Diagnosis Date  . Anxiety   . Arthritis   . Bipolar disorder ([place])   . COPD (chronic obstructive pulmonary disease) ([place])   . Coronary atherosclerosis of native coronary artery    Nonobstructive  . Depression   . Diabetes mellitus, type 2 ([place])   . Dysphagia   . Essential hypertension, benign   . GERD (gastroesophageal reflux disease)   . Pneumonia   . Pulmonary nodule July 2009   Nonspecific 5 mm right middle lobe  . TIA (transient ischemic attack)     Past Surgical History:  Procedure Laterality Date  . ABDOMINAL HYSTERECTOMY    . APPENDECTOMY    . CARDIAC CATHETERIZATION  11/2010  . CATARACT EXTRACTION, BILATERAL Bilateral   . CHOLECYSTECTOMY    . DILATION AND CURETTAGE OF UTERUS    . INCISION AND DRAINAGE Left 03/12/2014   Procedure: LEFT INCISION/DRAINAGE DEEP ABSCESS/BURSA/HEMATOMA THIGH/KNEE REGION;  Surgeon: Renette Butters, MD;  Location: [place];  Service: Orthopedics;  Laterality: Left;  . KNEE ARTHROSCOPY Left   . ORIF DISTAL FEMUR FRACTURE Left 01/24/2014  . ORIF FEMUR FRACTURE Left 01/24/2014   Procedure: OPEN REDUCTION INTERNAL FIXATION (ORIF) DISTAL FEMUR FRACTURE;  Surgeon: Renette Butters, MD;  Location: [place] [facility];  Service: Orthopedics;  Laterality: Left;  . TIBIA FRACTURE SURGERY Left ~ 1959  . TOTAL KNEE ARTHROPLASTY Left 2000  . VESICOVAGINAL FISTULA CLOSURE W/ TAH      Current Outpatient Medications  Medication Sig Dispense Refill  . albuterol (PROVENTIL HFA;VENTOLIN HFA) 108 (90 BASE) MCG/ACT inhaler Inhale 2 puffs into the lungs every 6 (six) hours as needed for wheezing or shortness of breath.    Marland Kitchen amLODipine (NORVASC) 10 MG  tablet Take 5 mg by mouth daily.     Marland Kitchen atorvastatin (LIPITOR) 10 MG tablet TAKE 1 TABLET BY MOUTH AT BEDTIME. 30 tablet 2  . busPIRone (BUSPAR) 10 MG tablet Take 1 tablet (10 mg total) by mouth 3 (three) times daily as needed (anxiety). 270 tablet 0  . escitalopram (LEXAPRO) 10 MG tablet Take 1 tablet (10 mg total) by mouth daily. 90 tablet 0  . furosemide (LASIX) 40 MG tablet Take by mouth. Taking 1.5 tabs QD    . GLIPIZIDE PO Take 2 mg by mouth daily.    Marland Kitchen HYDROcodone-acetaminophen (NORCO/VICODIN) 5-325 MG per tablet Take 1 tablet by mouth every 4 (four) hours as needed for moderate pain. (Patient taking differently: Take 1 tablet by mouth 2 (two) times daily. ) 30 tablet 0  . ipratropium-albuterol (DUONEB) 0.5-2.5 (3) MG/3ML SOLN Take 3 mLs by nebulization 4 (four) times daily as needed. Shortness of breath    . lamoTRIgine (LAMICTAL) 100 MG tablet Take 1 tablet (100 mg total) by mouth at bedtime. 90 tablet 0  . memantine (NAMENDA XR) 14 MG CP24 24 hr capsule Take 14 mg by mouth daily.    . metoprolol tartrate (LOPRESSOR) 25 MG tablet TAKE (1/2) TABLET BY MOUTH 2 TIMES A DAY. 30 tablet 0  . metoprolol tartrate (LOPRESSOR) 25 MG tablet TAKE (1/2) TABLET BY MOUTH TWICE DAILY. 30 tablet 2  . metoprolol tartrate (LOPRESSOR) 25  MG tablet TAKE (1/2) TABLET BY MOUTH TWICE DAILY. 30 tablet 0  . metoprolol tartrate (LOPRESSOR) 25 MG tablet TAKE (1/2) TABLET BY MOUTH TWICE DAILY. 30 tablet 2  . mirtazapine (REMERON) 15 MG tablet Take 1 tablet (15 mg total) by mouth at bedtime. 90 tablet 0  . omeprazole (PRILOSEC) 40 MG capsule Take 20 mg by mouth daily.     . QUEtiapine (SEROQUEL) 25 MG tablet Take 1 tablet (25 mg total) by mouth at bedtime. 90 tablet 0  . solifenacin (VESICARE) 10 MG tablet Take 10 mg by mouth daily.       No current facility-administered medications for this visit.    Allergies:  Azithromycin; Penicillins; and Tetracycline   Social History: The patient  reports that she quit  smoking about 7 years ago. Her smoking use included cigarettes. She has a 29.00 pack-year smoking history. She has never used smokeless tobacco. She reports that she does not drink alcohol or use drugs.   Family History: The patient's family history includes Coronary artery disease in her unknown relative.   ROS:  Please see the history of present illness. Otherwise, complete review of systems is positive for {NONE DEFAULTED:18576::"none"}.  All other systems are reviewed and negative.   Physical Exam: VS:  There were no vitals taken for this visit., BMI There is no height or weight on file to calculate BMI.  Wt Readings from Last 3 Encounters:  08/04/16 200 lb (90.7 kg)  03/11/16 215 lb (97.5 kg)  04/14/15 200 lb (90.7 kg)    General: Patient appears comfortable at rest. HEENT: Conjunctiva and lids normal, oropharynx clear with moist mucosa. Neck: Supple, no elevated JVP or carotid bruits, no thyromegaly. Lungs: Clear to auscultation, nonlabored breathing at rest. Cardiac: Regular rate and rhythm, no S3 or significant systolic murmur, no pericardial rub. Abdomen: Soft, nontender, no hepatomegaly, bowel sounds present, no guarding or rebound. Extremities: No pitting edema, distal pulses 2+. Skin: Warm and dry. Musculoskeletal: No kyphosis. Neuropsychiatric: Alert and oriented x3, affect grossly appropriate.  ECG: I personally reviewed the tracing from 03/11/2016 which showed sinus rhythm with incomplete left bundle branch block.  Recent Labwork:  August 2017: Hemoglobin 12.0, platelets 247, BUN 20, creatinine 1.31, potassium 4.0, AST 12, ALT 13, cholesterol 155, triglycerides 154, HDL 65, LDL 59, TSH 1.11, hemoglobin A1c 6.5%  Other Studies Reviewed Today:  Echocardiogram June 2012: LVEF 55-60%, mildly thickened aortic valve with moderate aortic regurgitation, MAC with moderate mitral regurgitation, mild tricuspid regurgitation.  Assessment and Plan:    Current medicines were  reviewed with the patient today.  No orders of the defined types were placed in this encounter.   Disposition:  Signed, Satira Sark, MD, [facility] 04/25/2018 1:43 PM    [facility] [place], [place], [place] [address] Phone: [phone]; Fax: [phone]

## 2018-04-26 ENCOUNTER — Ambulatory Visit: Payer: Self-pay | Admitting: Cardiology

## 2018-05-10 ENCOUNTER — Other Ambulatory Visit (HOSPITAL_COMMUNITY): Payer: Self-pay | Admitting: Psychiatry

## 2018-05-10 MED ORDER — QUETIAPINE FUMARATE 25 MG PO TABS: 25.0000 mg | ORAL_TABLET | Freq: Every day | ORAL | 0 refills | Status: DC

## 2018-05-10 MED ORDER — ESCITALOPRAM OXALATE 10 MG PO TABS: 10.0000 mg | ORAL_TABLET | Freq: Every day | ORAL | 0 refills | Status: DC

## 2018-05-10 MED ORDER — LAMOTRIGINE 100 MG PO TABS: 100.0000 mg | ORAL_TABLET | Freq: Every day | ORAL | 0 refills | Status: DC

## 2018-05-10 MED ORDER — MIRTAZAPINE 15 MG PO TABS: 15.0000 mg | ORAL_TABLET | Freq: Every day | ORAL | 0 refills | Status: DC

## 2018-05-15 NOTE — Progress Notes (Signed)
BH MD/PA/NP OP Progress Note  05/16/2018 2:51 PM Brenda Mcdonald  MRN:  [id number]  Chief Complaint:  Chief Complaint    Depression; Follow-up     HPI:  Patient presents for follow-up appointment for bipolar disorder.  She states that she has been doing good, although she has good days and bad days.  She has not been able to do things so much as she used to, stating that she used to be outgoing person.  She feels lonely now that she has not be able to see her children as she wishes.  Her husband tends to stay in the house after he suffered from stroke years ago.  She has been trying to accept things.  She agrees that she may try senior center.  She reports good relationship with her niece, who checks in with the patient every day.  She occasionally has insomnia.  She feels depressed at times.  She has fair energy.  She has mild anhedonia, stating that she does not watch TV anymore.  She denies any SI or hallucinations.  She denies decreased need for sleep or euphoria.  She denies anxiety.   Functional Status Instrumental Activities of Daily Living (IADLs):  Brenda Mcdonald is independent in the following: cooking at times Requires assistance with the following: medication (niece put medication in pill box), managing finances (niece), driving, uses walker at home  Activities of Daily Living (ADLs):  Brenda Mcdonald is independent in the following: bathing and hygiene, feeding, continence, grooming and toileting, walking  Wt Readings from Last 3 Encounters:  05/16/18 213 lb (96.6 kg)  02/13/18 207 lb (93.9 kg)  11/14/17 199 lb (90.3 kg)    Visit Diagnosis:    ICD-10-CM   1. Bipolar II disorder (HCC) F31.81   2. Major neurocognitive disorder (HCC) F01.50     Past Psychiatric History: Please see initial evaluation for full details. I have reviewed the history. No updates at this time.     Past Medical History:  Past Medical History:  Diagnosis Date  . Anxiety   . Arthritis   .  Bipolar disorder (HCC)   . COPD (chronic obstructive pulmonary disease) (HCC)   . Coronary atherosclerosis of native coronary artery    Nonobstructive  . Depression   . Diabetes mellitus, type 2 (HCC)   . Dysphagia   . Essential hypertension, benign   . GERD (gastroesophageal reflux disease)   . Pneumonia   . Pulmonary nodule July 2009   Nonspecific 5 mm right middle lobe  . TIA (transient ischemic attack)     Past Surgical History:  Procedure Laterality Date  . ABDOMINAL HYSTERECTOMY    . APPENDECTOMY    . CARDIAC CATHETERIZATION  11/2010  . CATARACT EXTRACTION, BILATERAL Bilateral   . CHOLECYSTECTOMY    . DILATION AND CURETTAGE OF UTERUS    . INCISION AND DRAINAGE Left 03/12/2014   Procedure: LEFT INCISION/DRAINAGE DEEP ABSCESS/BURSA/HEMATOMA THIGH/KNEE REGION;  Surgeon: [name], MD;  Location: [facility];  Service: Orthopedics;  Laterality: Left;  . KNEE ARTHROSCOPY Left   . ORIF DISTAL FEMUR FRACTURE Left 01/24/2014  . ORIF FEMUR FRACTURE Left 01/24/2014   Procedure: OPEN REDUCTION INTERNAL FIXATION (ORIF) DISTAL FEMUR FRACTURE;  Surgeon: [name], MD;  Location: [facility];  Service: Orthopedics;  Laterality: Left;  . TIBIA FRACTURE SURGERY Left ~ 1959  . TOTAL KNEE ARTHROPLASTY Left 2000  . VESICOVAGINAL FISTULA CLOSURE W/ TAH      Family Psychiatric History: Please  see initial evaluation for full details. I have reviewed the history. No updates at this time.     Family History:  Family History  Problem Relation Age of Onset  . Coronary artery disease Unknown     Social History:  Social History   Socioeconomic History  . Marital status: Married    Spouse name: Not on file  . Number of children: Not on file  . Years of education: Not on file  . Highest education level: Not on file  Occupational History  . Not on file  Social Needs  . Financial resource strain: Not on file  . Food insecurity:    Worry: Not on file    Inability: Not on file  .  Transportation needs:    Medical: Not on file    Non-medical: Not on file  Tobacco Use  . Smoking status: Former Smoker    Packs/day: 0.50    Years: 58.00    Pack years: 29.00    Types: Cigarettes    Last attempt to quit: 07/26/2010    Years since quitting: 7.8  . Smokeless tobacco: Never Used  Substance and Sexual Activity  . Alcohol use: No    Alcohol/week: 0.0 standard drinks  . Drug use: No  . Sexual activity: Never  Lifestyle  . Physical activity:    Days per week: Not on file    Minutes per session: Not on file  . Stress: Not on file  Relationships  . Social connections:    Talks on phone: Not on file    Gets together: Not on file    Attends religious service: Not on file    Active member of club or organization: Not on file    Attends meetings of clubs or organizations: Not on file    Relationship status: Not on file  Other Topics Concern  . Not on file  Social History Narrative  . Not on file    Allergies:  Allergies  Allergen Reactions  . Azithromycin     Per MAR  . Penicillins     Tolerated Rocephin (July 2015)  . Tetracycline     Per MAR    Metabolic Disorder Labs: No results found for: HGBA1C, MPG No results found for: PROLACTIN Lab Results  Component Value Date   CHOL  09/06/2008    164        ATP III CLASSIFICATION:  <200     mg/dL   Desirable  366-440  mg/dL   Borderline High  >=347    mg/dL   High          TRIG 86 09/06/2008   HDL 49 09/06/2008   CHOLHDL 3.3 09/06/2008   VLDL 17 09/06/2008   LDLCALC  09/06/2008    98        Total Cholesterol/HDL:CHD Risk Coronary Heart Disease Risk Table                     Men   Women  1/2 Average Risk   3.4   3.3  Average Risk       5.0   4.4  2 X Average Risk   9.6   7.1  3 X Average Risk  23.4   11.0        Use the calculated Patient Ratio above and the CHD Risk Table to determine the patient's CHD Risk.        ATP III CLASSIFICATION (LDL):  <100  mg/dL   Optimal  948-546  mg/dL    Near or Above                    Optimal  130-159  mg/dL   Borderline  270-350  mg/dL   High  >093     mg/dL   Very High   No results found for: TSH  Therapeutic Level Labs: No results found for: LITHIUM No results found for: VALPROATE No components found for:  CBMZ  Current Medications: Current Outpatient Medications  Medication Sig Dispense Refill  . albuterol (PROVENTIL HFA;VENTOLIN HFA) 108 (90 BASE) MCG/ACT inhaler Inhale 2 puffs into the lungs every 6 (six) hours as needed for wheezing or shortness of breath.    Marland Kitchen amLODipine (NORVASC) 10 MG tablet Take 5 mg by mouth daily.     Marland Kitchen atorvastatin (LIPITOR) 10 MG tablet TAKE 1 TABLET BY MOUTH AT BEDTIME. 30 tablet 2  . busPIRone (BUSPAR) 10 MG tablet Take 1 tablet (10 mg total) by mouth 3 (three) times daily as needed (anxiety). 270 tablet 1  . escitalopram (LEXAPRO) 10 MG tablet Take 1 tablet (10 mg total) by mouth daily. 28 tablet 3  . furosemide (LASIX) 40 MG tablet Take by mouth. Taking 1.5 tabs QD    . GLIPIZIDE PO Take 2 mg by mouth daily.    Marland Kitchen HYDROcodone-acetaminophen (NORCO/VICODIN) 5-325 MG per tablet Take 1 tablet by mouth every 4 (four) hours as needed for moderate pain. (Patient taking differently: Take 1 tablet by mouth 2 (two) times daily. ) 30 tablet 0  . ipratropium-albuterol (DUONEB) 0.5-2.5 (3) MG/3ML SOLN Take 3 mLs by nebulization 4 (four) times daily as needed. Shortness of breath    . lamoTRIgine (LAMICTAL) 100 MG tablet Take 1 tablet (100 mg total) by mouth at bedtime. 28 tablet 3  . memantine (NAMENDA XR) 14 MG CP24 24 hr capsule Take 14 mg by mouth daily.    . metoprolol tartrate (LOPRESSOR) 25 MG tablet TAKE (1/2) TABLET BY MOUTH 2 TIMES A DAY. 30 tablet 0  . metoprolol tartrate (LOPRESSOR) 25 MG tablet TAKE (1/2) TABLET BY MOUTH TWICE DAILY. 30 tablet 2  . metoprolol tartrate (LOPRESSOR) 25 MG tablet TAKE (1/2) TABLET BY MOUTH TWICE DAILY. 30 tablet 0  . metoprolol tartrate (LOPRESSOR) 25 MG tablet TAKE (1/2)  TABLET BY MOUTH TWICE DAILY. 30 tablet 2  . mirtazapine (REMERON) 15 MG tablet Take 1 tablet (15 mg total) by mouth at bedtime. 28 tablet 0  . omeprazole (PRILOSEC) 40 MG capsule Take 20 mg by mouth daily.     . QUEtiapine (SEROQUEL) 25 MG tablet Take 1 tablet (25 mg total) by mouth at bedtime. 28 tablet 0  . solifenacin (VESICARE) 10 MG tablet Take 10 mg by mouth daily.       No current facility-administered medications for this visit.     Musculoskeletal: Strength & Muscle Tone: within normal limits Gait & Station: normal Patient leans: N/A  Psychiatric Specialty Exam: Review of Systems  Psychiatric/Behavioral: Positive for memory loss. Negative for depression, hallucinations, substance abuse and suicidal ideas. The patient has insomnia. The patient is not nervous/anxious.   All other systems reviewed and are negative.   Blood pressure (!) 144/75, pulse 61, height 5\' 4"  (1.626 m), weight 213 lb (96.6 kg), SpO2 93 %.Body mass index is 36.56 kg/m.  General Appearance: Fairly Groomed  Eye Contact:  Good  Speech:  Clear and Coherent  Volume:  Normal  Mood:  "good"  Affect:  Appropriate, Congruent and euthymic  Thought Process:  Coherent  Orientation:  Full (Time, Place, and Person)  Thought Content: Logical   Suicidal Thoughts:  No  Homicidal Thoughts:  No  Memory:  Immediate;   Fair  Judgement:  Good  Insight:  Fair  Psychomotor Activity:  Normal  Concentration:  Concentration: Good and Attention Span: Good  Recall:  Fair  Fund of Knowledge: Good  Language: Good  Akathisia:  No  Handed:  Right  AIMS (if indicated): not done  Assets:  Communication Skills Desire for Improvement  ADL's:  Impaired  Cognition: Impaired,  Moderate  Sleep:  Fair   Screenings: PHQ2-9     Office Visit from 04/09/2014 in [facility]  PHQ-2 Total Score  0     GDS-810/2018  MOCA: 12/30 on 12/28/2016. (-4 for visuospatial, -1 for naming, -4 for  attention, -2 for language, -1 for abstraction, -5 for delayed recall, -1 for orientation "6th")  Assessment and Plan:  Brenda Mcdonald is a 77 y.o. year old female with a history of depression, unspecified neurocognitive disorder, hypertension,  CAD,COPD, TIA, type II DM, who presents for follow up appointment for Bipolar II disorder (HCC)  Major neurocognitive disorder (HCC)  # Bipolar II disorder The patient reports overall improvement in mood symptoms since the last appointment.  Will continue lamotrigine to target mood dysregulation.  Discussed potential risk of Stevens-Johnson syndrome.  Will continue quetiapine to target bipolar 2 disorder.  Discussed potential metabolic side effect.  Will continue Lexapro and mirtazapine to target depression.  Will continue BuSpar to target anxiety.  Noted that although it has been discussed to taper off some of her medication, she reports strong preference to stay on current medication.  Discussed behavioral activation.   # Major neurocognitive disorder She has cognitive deficit as characterized by Moca.  Unable to do repeat Moca as patient does not have glasses and her hearing aid does not work well. Will assess at the next visit.   Plan I have reviewed and updated plans as below 1. Continue mirtazapine 15 mg at night 2. Continue lexapro 10 mg daily  3.Continuelamotrigine100mg  daily 4. Continue quetiapine 25 mg night 5. Continue buspar 10 mg three times a day as needed for anxiety  6. Return to clinic in four months for 30 mins 7.(She is on memantine 14 mg daily)  The patient demonstrates the following risk factors for suicide: Chronic risk factors for suicide include: psychiatric disorder of bipolar disorder. Acute risk factorsfor suicide include: unemployment and social withdrawal/isolation. Protective factorsfor this patient include: positive social support, coping skills. Considering these factors, the overall suicide risk at this  point appears to be low. Patient isappropriate for outpatient follow up.  The duration of this appointment visit was 30 minutes of face-to-face time with the patient.  Greater than 50% of this time was spent in counseling, explanation of  diagnosis, planning of further management, and coordination of care.  Neysa Hotter, MD 05/16/2018, 2:51 PM

## 2018-05-16 ENCOUNTER — Encounter (HOSPITAL_COMMUNITY): Payer: Self-pay | Admitting: Psychiatry

## 2018-05-16 ENCOUNTER — Ambulatory Visit (INDEPENDENT_AMBULATORY_CARE_PROVIDER_SITE_OTHER): Payer: Medicare Other | Admitting: Psychiatry

## 2018-05-16 VITALS — BP 144/75 | HR 61 | Ht 64.0 in | Wt 213.0 lb

## 2018-05-16 DIAGNOSIS — G4700 Insomnia, unspecified: Secondary | ICD-10-CM | POA: Diagnosis not present

## 2018-05-16 DIAGNOSIS — F3181 Bipolar II disorder: Secondary | ICD-10-CM | POA: Diagnosis not present

## 2018-05-16 DIAGNOSIS — F0150 Vascular dementia without behavioral disturbance: Secondary | ICD-10-CM | POA: Diagnosis not present

## 2018-05-16 DIAGNOSIS — F0390 Unspecified dementia without behavioral disturbance: Secondary | ICD-10-CM

## 2018-05-16 MED ORDER — QUETIAPINE FUMARATE 25 MG PO TABS: 25.0000 mg | ORAL_TABLET | Freq: Every day | ORAL | 0 refills | Status: DC

## 2018-05-16 MED ORDER — BUSPIRONE HCL 10 MG PO TABS: 10.0000 mg | ORAL_TABLET | Freq: Three times a day (TID) | ORAL | 1 refills | Status: DC | PRN

## 2018-05-16 MED ORDER — LAMOTRIGINE 100 MG PO TABS: 100.0000 mg | ORAL_TABLET | Freq: Every day | ORAL | 3 refills | Status: DC

## 2018-05-16 MED ORDER — ESCITALOPRAM OXALATE 10 MG PO TABS: 10.0000 mg | ORAL_TABLET | Freq: Every day | ORAL | 3 refills | Status: DC

## 2018-05-16 NOTE — Patient Instructions (Signed)
1. Continue mirtazapine 15 mg at night 2. Continue lexapro 10 mg daily  3.Continuelamotrigine100mg  daily 4. Continue quetiapine 25 mg night 5. Continue buspar 10 mg three times a day as needed for anxiety  6. Return to clinic in four months for 30 mins

## 2018-05-29 ENCOUNTER — Ambulatory Visit (HOSPITAL_COMMUNITY): Payer: Self-pay | Admitting: Psychiatry

## 2018-05-31 ENCOUNTER — Other Ambulatory Visit: Payer: Self-pay | Admitting: Cardiology

## 2018-05-31 ENCOUNTER — Other Ambulatory Visit (HOSPITAL_COMMUNITY): Payer: Self-pay | Admitting: Psychiatry

## 2018-05-31 MED ORDER — MIRTAZAPINE 15 MG PO TABS: 15.0000 mg | ORAL_TABLET | Freq: Every day | ORAL | 2 refills | Status: DC

## 2018-05-31 MED ORDER — QUETIAPINE FUMARATE 25 MG PO TABS: 25.0000 mg | ORAL_TABLET | Freq: Every day | ORAL | 2 refills | Status: DC

## 2018-06-12 ENCOUNTER — Ambulatory Visit (HOSPITAL_COMMUNITY): Payer: Self-pay | Admitting: Psychiatry

## 2018-06-14 ENCOUNTER — Encounter: Payer: Self-pay | Admitting: *Deleted

## 2018-06-19 ENCOUNTER — Ambulatory Visit (INDEPENDENT_AMBULATORY_CARE_PROVIDER_SITE_OTHER): Payer: Medicare Other | Admitting: Cardiology

## 2018-06-19 ENCOUNTER — Encounter: Payer: Self-pay | Admitting: Cardiology

## 2018-06-19 ENCOUNTER — Encounter: Payer: Self-pay | Admitting: *Deleted

## 2018-06-19 VITALS — BP 113/77 | HR 65 | Ht 64.0 in | Wt 218.8 lb

## 2018-06-19 DIAGNOSIS — I2510 Atherosclerotic heart disease of native coronary artery without angina pectoris: Secondary | ICD-10-CM

## 2018-06-19 DIAGNOSIS — I10 Essential (primary) hypertension: Secondary | ICD-10-CM | POA: Diagnosis not present

## 2018-06-19 DIAGNOSIS — E782 Mixed hyperlipidemia: Secondary | ICD-10-CM | POA: Diagnosis not present

## 2018-06-19 DIAGNOSIS — R0989 Other specified symptoms and signs involving the circulatory and respiratory systems: Secondary | ICD-10-CM

## 2018-06-19 NOTE — Progress Notes (Signed)
Cardiology Office Note  Date: 06/19/2018   ID: Brenda Mcdonald, DOB Oct 30, 1940, MRN [id number]  PCP: Glenda Chroman, MD  Primary Cardiologist: Rozann Lesches, MD   Chief Complaint  Patient presents with  . Cardiac follow-up    History of Present Illness: Brenda Mcdonald is a 77 y.o. female not seen since August 2017.  She presents overdue for follow-up.  States that she lives at home with family support, still limited and uses a wheelchair.  She reports atypical chest pains that do not sound ischemic based on description.  No palpitations or syncope.  I reviewed her medications.  Current cardiac regimen includes aspirin, Lipitor, Norvasc, and Lopressor.  I personally reviewed her ECG today which shows sinus rhythm with borderline prolonged PR interval, PAC, and left bundle branch block.  She follows with Dr. Woody Seller regularly.  Past Medical History:  Diagnosis Date  . Anxiety   . Arthritis   . Bipolar disorder ([place])   . COPD (chronic obstructive pulmonary disease) ([place])   . Coronary atherosclerosis of native coronary artery    Nonobstructive  . Depression   . Diabetes mellitus, type 2 ([place])   . Dysphagia   . Essential hypertension, benign   . GERD (gastroesophageal reflux disease)   . Pneumonia   . Pulmonary nodule July 2009   Nonspecific 5 mm right middle lobe  . TIA (transient ischemic attack)     Past Surgical History:  Procedure Laterality Date  . ABDOMINAL HYSTERECTOMY    . APPENDECTOMY    . CARDIAC CATHETERIZATION  11/2010  . CATARACT EXTRACTION, BILATERAL Bilateral   . CHOLECYSTECTOMY    . DILATION AND CURETTAGE OF UTERUS    . INCISION AND DRAINAGE Left 03/12/2014   Procedure: LEFT INCISION/DRAINAGE DEEP ABSCESS/BURSA/HEMATOMA THIGH/KNEE REGION;  Surgeon: Renette Butters, MD;  Location: [facility];  Service: Orthopedics;  Laterality: Left;  . KNEE ARTHROSCOPY Left   . ORIF DISTAL FEMUR FRACTURE Left 01/24/2014  . ORIF FEMUR FRACTURE Left 01/24/2014   Procedure: OPEN  REDUCTION INTERNAL FIXATION (ORIF) DISTAL FEMUR FRACTURE;  Surgeon: Renette Butters, MD;  Location: [facility];  Service: Orthopedics;  Laterality: Left;  . TIBIA FRACTURE SURGERY Left ~ 1959  . TOTAL KNEE ARTHROPLASTY Left 2000  . VESICOVAGINAL FISTULA CLOSURE W/ TAH      Current Outpatient Medications  Medication Sig Dispense Refill  . albuterol (PROVENTIL HFA;VENTOLIN HFA) 108 (90 BASE) MCG/ACT inhaler Inhale 2 puffs into the lungs every 6 (six) hours as needed for wheezing or shortness of breath.    Marland Kitchen amLODipine (NORVASC) 5 MG tablet Take 5 mg by mouth daily.    Marland Kitchen aspirin EC 81 MG tablet Take 81 mg by mouth daily.    Marland Kitchen atorvastatin (LIPITOR) 10 MG tablet TAKE 1 TABLET BY MOUTH AT BEDTIME. 28 tablet 11  . busPIRone (BUSPAR) 10 MG tablet Take 1 tablet (10 mg total) by mouth 3 (three) times daily as needed (anxiety). 270 tablet 1  . escitalopram (LEXAPRO) 10 MG tablet Take 1 tablet (10 mg total) by mouth daily. 28 tablet 3  . FARXIGA 5 MG TABS tablet Take 1 tablet by mouth daily.    . furosemide (LASIX) 40 MG tablet Take by mouth. Taking 1.5 tabs QD    . glimepiride (AMARYL) 2 MG tablet Take 1 tablet by mouth 2 (two) times daily.    Marland Kitchen HYDROcodone-acetaminophen (NORCO/VICODIN) 5-325 MG per tablet Take 1 tablet by mouth every 4 (four) hours as needed for moderate pain. (  Patient taking differently: Take 1 tablet by mouth 2 (two) times daily. ) 30 tablet 0  . ipratropium-albuterol (DUONEB) 0.5-2.5 (3) MG/3ML SOLN Take 3 mLs by nebulization 4 (four) times daily as needed. Shortness of breath    . JANUVIA 50 MG tablet Take 1 tablet by mouth daily.    Marland Kitchen lamoTRIgine (LAMICTAL) 100 MG tablet Take 1 tablet (100 mg total) by mouth at bedtime. 28 tablet 3  . memantine (NAMENDA XR) 14 MG CP24 24 hr capsule Take 14 mg by mouth daily.    . metoprolol tartrate (LOPRESSOR) 25 MG tablet TAKE (1/2) TABLET BY MOUTH TWICE DAILY. 28 tablet 11  . mirtazapine (REMERON) 15 MG tablet Take 1 tablet (15 mg total) by mouth  at bedtime. 28 tablet 2  . omeprazole (PRILOSEC) 40 MG capsule Take 20 mg by mouth daily.     . QUEtiapine (SEROQUEL) 25 MG tablet Take 1 tablet (25 mg total) by mouth at bedtime. 28 tablet 2  . solifenacin (VESICARE) 10 MG tablet Take 10 mg by mouth daily.      Marland Kitchen VITAMIN D, CHOLECALCIFEROL, PO Take 1 tablet by mouth daily.     No current facility-administered medications for this visit.    Allergies:  Azithromycin; Penicillins; and Tetracycline   Social History: The patient  reports that she quit smoking about 7 years ago. Her smoking use included cigarettes. She has a 29.00 pack-year smoking history. She has never used smokeless tobacco. She reports that she does not drink alcohol or use drugs.   ROS:  Please see the history of present illness. Otherwise, complete review of systems is positive for hearing loss, chronic fatigue, leg weakness.  All other systems are reviewed and negative.   Physical Exam: VS:  BP 113/77   Pulse 65   Ht 5' 4"  (1.626 m)   Wt 218 lb 12.8 oz (99.2 kg)   SpO2 93%   BMI 37.56 kg/m , BMI Body mass index is 37.56 kg/m.  Wt Readings from Last 3 Encounters:  06/19/18 218 lb 12.8 oz (99.2 kg)  08/04/16 200 lb (90.7 kg)  03/11/16 215 lb (97.5 kg)    General: Patient appears comfortable at rest. HEENT: Conjunctiva and lids normal, oropharynx clear. Neck: Supple, no elevated JVP, bilateral carotid bruits right greater than left, no thyromegaly. Lungs: Clear to auscultation, nonlabored breathing at rest. Cardiac: Regular rate and rhythm, no S3, soft systolic murmur. Abdomen: Soft, nontender, bowel sounds present. Extremities: Mild lower leg edema, distal pulses 1+. Skin: Warm and dry. Musculoskeletal: No kyphosis. Neuropsychiatric: Alert and oriented x3, affect grossly appropriate.  ECG: I personally reviewed the tracing from 03/11/2016 which showed sinus rhythm with incomplete left bundle branch block.  Recent Labwork:  August 2017: Hemoglobin 12.0,  platelets 247, BUN 20, creatinine 1.31, potassium 4.0, AST 12, ALT 13, cholesterol 155, triglycerides 154, HDL 65, LDL 59, TSH 1.11, hemoglobin A1c 6.5%  Other Studies Reviewed Today:  Echocardiogram June 2012: LVEF 55-60%, mildly thickened aortic valve with moderate aortic regurgitation, MAC with moderate mitral regurgitation, mild tricuspid regurgitation.  Assessment and Plan:  1.  Nonobstructive CAD based on previous work-up.  Medical therapy is reasonable from a cardiac perspective, she does not report ischemic sounding chest discomfort and is functionally limited at baseline.  ECG reviewed.  No changes were made today.  2.  Carotid bruits, right greater than left.  Carotid Dopplers will be obtained.  She is on aspirin and statin.  3.  Mixed hyperlipidemia on Lipitor.  Requesting  most recent lab work from Dr. Woody Seller.  Previous LDL 59.  4.  Essential hypertension, blood pressure is well controlled today.  Current medicines were reviewed with the patient today.   Orders Placed This Encounter  Procedures  . EKG 12-Lead    Disposition: Follow-up in 1 year.  Signed, Satira Sark, MD, [facility] 06/19/2018 4:11 PM    [place] [facility] [place], [place], [place] [address] Phone: [phone]; Fax: [phone]

## 2018-06-19 NOTE — Patient Instructions (Addendum)
Medication Instructions:   Your physician recommends that you continue on your current medications as directed. Please refer to the Current Medication list given to you today.  Labwork:  NONE  Testing/Procedures: Your physician has requested that you have a carotid duplex. This test is an ultrasound of the carotid arteries in your neck. It looks at blood flow through these arteries that supply the brain with blood. Allow one hour for this exam. There are no restrictions or special instructions.  Follow-Up:  Your physician recommends that you schedule a follow-up appointment in: 1 year. You will receive a reminder letter in the mail in about 10 months reminding you to call and schedule your appointment. If you don't receive this letter, please contact our office.  Any Other Special Instructions Will Be Listed Below (If Applicable).  If you need a refill on your cardiac medications before your next appointment, please call your pharmacy. 

## 2018-06-23 DIAGNOSIS — Z6835 Body mass index (BMI) 35.0-35.9, adult: Secondary | ICD-10-CM | POA: Diagnosis not present

## 2018-06-23 DIAGNOSIS — E1122 Type 2 diabetes mellitus with diabetic chronic kidney disease: Secondary | ICD-10-CM | POA: Diagnosis not present

## 2018-06-23 DIAGNOSIS — I10 Essential (primary) hypertension: Secondary | ICD-10-CM | POA: Diagnosis not present

## 2018-06-23 DIAGNOSIS — Z299 Encounter for prophylactic measures, unspecified: Secondary | ICD-10-CM | POA: Diagnosis not present

## 2018-06-23 DIAGNOSIS — M545 Low back pain: Secondary | ICD-10-CM | POA: Diagnosis not present

## 2018-06-26 ENCOUNTER — Ambulatory Visit (INDEPENDENT_AMBULATORY_CARE_PROVIDER_SITE_OTHER): Payer: Medicare Other | Admitting: Psychiatry

## 2018-06-26 DIAGNOSIS — F3181 Bipolar II disorder: Secondary | ICD-10-CM

## 2018-06-26 NOTE — Progress Notes (Signed)
      THERAPIST PROGRESS NOTE                   Session Time:  Monday 06/26/2018 3:05 PM - 3:40 PM                                            Participation Level: Active  Behavioral Response: CasualAlert/depressed  Type of Therapy: Individual Therapy  Treatment Goals addressed: learn how to cope with feelings of depression   Interventions: Supportive/CBT  Summary: KARLY PITTER is a 77 y.o. female who is referred for services by psychiatrist Dr. Vanetta Shawl  due to patient experiencing symptoms of depression. She has a history of multiple psychiatric admissions for depression and hallucinations. She recently began seeing psychiatrist Dr. Vanetta Shawl for medication management. Patient states she has had nerve problems all of her life and says she has been in and out of the hospital. She reports needing somebody to talk to and says she also has depression at times.  Patient last was seen two months ago. She is pleased she has gotten a new hearing aid and now can hear better especially when using the phone. She also reports now being able to hear the television. She is planning to get a hearing aid for her other ear today. She reports her mood has been better but still expresses sadness and frustration her family isn't like it used to be. She reports people don't come to her home during the holidays like they did in the past. She also expresses frustration her grandchildren do not bring her great-grandchildren to see her as frequently as she would like. She reports niece spends more time with patient's great-grandchildren. She expresses sadness that she can't do things with her great-grandchildren like she would prefer. Patient reports she does perform household tasks within her capability and continues to read.  Therapist Response:  Reviewed symptoms,praise and reinforce patient's efforts regarding obtaining hearing aid, discussed stressors, facilitated expression of thoughts and feelings, validated feelings,  assisted patient identify and replace unhelpful thoughts about grandchildren not visiting with helpful thoughts, discussed ways patient can improve assertiveness skills in expressing her thoughts and feelings to her grandchildren about bringing great grandchildren to visit, explore ways patient could increase socialization and developed plan for patient to contact senior center program,   Plan: Return again in 4  weeks.  Diagnosis: Axis I: Major depressive disorder, recurrent, moderate    Axis II: Deferred    ,, LCSW 06/26/2018

## 2018-06-28 ENCOUNTER — Ambulatory Visit (INDEPENDENT_AMBULATORY_CARE_PROVIDER_SITE_OTHER): Payer: Medicare Other

## 2018-06-28 DIAGNOSIS — R0989 Other specified symptoms and signs involving the circulatory and respiratory systems: Secondary | ICD-10-CM

## 2018-06-29 ENCOUNTER — Telehealth: Payer: Self-pay | Admitting: *Deleted

## 2018-06-29 NOTE — Telephone Encounter (Signed)
-----   Message from Jonelle Sidle, MD sent at 06/28/2018  2:12 PM EST ----- Results reviewed.  Mild bilateral ICA atherosclerosis.  Continue with current medical therapy and follow-up plan. A copy of this test should be forwarded to Ignatius Specking, MD.

## 2018-06-29 NOTE — Telephone Encounter (Signed)
Patient informed. Copy sent to PCP °

## 2018-07-24 ENCOUNTER — Ambulatory Visit (HOSPITAL_COMMUNITY): Payer: Self-pay | Admitting: Psychiatry

## 2018-08-01 ENCOUNTER — Ambulatory Visit (HOSPITAL_COMMUNITY): Payer: Medicare Other | Admitting: Psychiatry

## 2018-08-15 ENCOUNTER — Ambulatory Visit (INDEPENDENT_AMBULATORY_CARE_PROVIDER_SITE_OTHER): Payer: Medicare Other | Admitting: Psychiatry

## 2018-08-15 DIAGNOSIS — F331 Major depressive disorder, recurrent, moderate: Secondary | ICD-10-CM

## 2018-08-15 NOTE — Progress Notes (Signed)
      THERAPIST PROGRESS NOTE                   Session Time:  Tuesday 08/15/2018 2:55 - 3: 47 PM                                           Participation Level: Active  Behavioral Response: CasualAlert/depressed  Type of Therapy: Individual Therapy  Treatment Goals addressed: learn how to cope with feelings of depression   Interventions: Supportive/CBT  Summary: Brenda Mcdonald is a 78 y.o. female who is referred for services by psychiatrist Dr. Vanetta Shawl  due to patient experiencing symptoms of depression. She has a history of multiple psychiatric admissions for depression and hallucinations. She recently began seeing psychiatrist Dr. Vanetta Shawl for medication management. Patient states she has had nerve problems all of her life and says she has been in and out of the hospital. She reports needing somebody to talk to and says she also has depression at times.  Patient last was seen 4-5 weeks ago. She reports less depressed mood but continued worry, nervousness.  She reports being glad Christmas is over. She enjoyed seeing family but also was stressed as husband was in the hospital Christmas week for about 5 days per her report. He suffered from pneumonia and congestive heart failure. She still worries about his health and fears he may have a stroke. She also worries about daughter who doesn't seem to be herself per patient's report. She still expresses sadness family isn't like it used to be. She reports not really wanting to leave home much anymore but staying busy and doing things at home like reading, coloring, and performing light household tasks. She still wants to see great grandchildren more often and reports expressing this to her grandchildren during the holidays. She reports not pursuing activities at senior center program due to lack of transportation.   Therapist Response:  Reviewed symptoms,praised and reinforce patient's efforts to use assertiveness skills with grandchildren, praised and  reinforced involvement in activitym discussed stressors, facilitated expression of thoughts and feelings, validated feelings, reviewed treatment plan, began to provide psychoeducation on what maintains worry  .Plan: Return again in 4  weeks.  Diagnosis: Axis I: Major depressive disorder, recurrent, moderate    Axis II: Deferred    [name], LCSW 08/15/2018

## 2018-08-29 ENCOUNTER — Ambulatory Visit (INDEPENDENT_AMBULATORY_CARE_PROVIDER_SITE_OTHER): Payer: Medicare Other | Admitting: Psychiatry

## 2018-08-29 ENCOUNTER — Encounter (HOSPITAL_COMMUNITY): Payer: Self-pay | Admitting: Psychiatry

## 2018-08-29 DIAGNOSIS — F331 Major depressive disorder, recurrent, moderate: Secondary | ICD-10-CM | POA: Diagnosis not present

## 2018-08-29 NOTE — Progress Notes (Signed)
      THERAPIST PROGRESS NOTE                   Session Time:  Tuesday 08/29/2018 3:10 PM - 4:00 PM                                          Participation Level: Active  Behavioral Response: CasualAlert/depressed  Type of Therapy: Individual Therapy  Treatment Goals addressed: learn how to cope with feelings of depression   Interventions: Supportive/CBT  Summary: Brenda Mcdonald is a 78 y.o. female who is referred for services by psychiatrist Dr. Vanetta Shawl  due to patient experiencing symptoms of depression. She has a history of multiple psychiatric admissions for depression and hallucinations. She recently began seeing psychiatrist Dr. Vanetta Shawl for medication management. Patient states she has had nerve problems all of her life and says she has been in and out of the hospital. She reports needing somebody to talk to and says she also has depression at times.  Patient last was seen 4-5 weeks ago. She reports increased feelings of depression and decreased interest in activities. She expresses frustration as she states she doesn't have anyone to talk to. Per her report, her husband doesn't talk much. One of her sons stays with her and husband on weekends. She says he is involved and is supportive. However, she expresses frustration her other children and grandchildren don't visit or call as much as she would like. She states missing being around people.   Therapist Response:  Discussed stressors, facilitated expression of thoughts and feelings, validated feelings, assisted patient identify interests she used to enjoy, discussed possibility of patient talking with son about taking her to church to increase socialization and behavioral activation, also assisted patient identify ways to nurture spirituality at home.  Plan: Return again in 4  weeks.  Diagnosis: Axis I: Major depressive disorder, recurrent, moderate    Axis II: Deferred    [name], LCSW 08/29/2018

## 2018-09-04 NOTE — Progress Notes (Signed)
BH MD/PA/NP OP Progress Note  09/11/2018 3:31 PM Brenda BLASZCZYK  MRN:  [id number]  Chief Complaint:  Chief Complaint    Follow-up; Depression     HPI:  Patient presents for follow-up appointment for bipolar 2 disorder.  She believes that she has been doing better compared to the last visit.  Her niece, and her son visits the patient regularly. She was unable to enjoy holiday as her husband admitted around that time. He is doing better lately.  She believes that her niece does not want to bring the patient to senior center.  She has been trying to find other things which she can do to be active.  She is concerned that she has gained weight.  She has hearing aids, and finds it very helpful to communicate with other people.  She also watches TV at times.  She has occasional insomnia.  She has more motivation and energy.  She has fair concentration.  She has good appetite.  She denies SI.  She feels anxious and tense at times.  She denies panic attacks.  She denies decreased need for sleep or euphoria.   Wt Readings from Last 3 Encounters:  09/11/18 215 lb (97.5 kg)  06/19/18 218 lb 12.8 oz (99.2 kg)  05/16/18 213 lb (96.6 kg)    Visit Diagnosis:    ICD-10-CM   1. Bipolar II disorder (HCC) F31.81     Past Psychiatric History: Please see initial evaluation for full details. I have reviewed the history. No updates at this time.     Past Medical History:  Past Medical History:  Diagnosis Date  . Anxiety   . Arthritis   . Bipolar disorder (HCC)   . COPD (chronic obstructive pulmonary disease) (HCC)   . Coronary atherosclerosis of native coronary artery    Nonobstructive  . Depression   . Diabetes mellitus, type 2 (HCC)   . Dysphagia   . Essential hypertension, benign   . GERD (gastroesophageal reflux disease)   . Pneumonia   . Pulmonary nodule July 2009   Nonspecific 5 mm right middle lobe  . TIA (transient ischemic attack)     Past Surgical History:  Procedure Laterality Date   . ABDOMINAL HYSTERECTOMY    . APPENDECTOMY    . CARDIAC CATHETERIZATION  11/2010  . CATARACT EXTRACTION, BILATERAL Bilateral   . CHOLECYSTECTOMY    . DILATION AND CURETTAGE OF UTERUS    . INCISION AND DRAINAGE Left 03/12/2014   Procedure: LEFT INCISION/DRAINAGE DEEP ABSCESS/BURSA/HEMATOMA THIGH/KNEE REGION;  Surgeon: [name], MD;  Location: [facility] OR;  Service: Orthopedics;  Laterality: Left;  . KNEE ARTHROSCOPY Left   . ORIF DISTAL FEMUR FRACTURE Left 01/24/2014  . ORIF FEMUR FRACTURE Left 01/24/2014   Procedure: OPEN REDUCTION INTERNAL FIXATION (ORIF) DISTAL FEMUR FRACTURE;  Surgeon: [name], MD;  Location: [facility] OR;  Service: Orthopedics;  Laterality: Left;  . TIBIA FRACTURE SURGERY Left ~ 1959  . TOTAL KNEE ARTHROPLASTY Left 2000  . VESICOVAGINAL FISTULA CLOSURE W/ TAH      Family Psychiatric History: Please see initial evaluation for full details. I have reviewed the history. No updates at this time.     Family History:  Family History  Problem Relation Age of Onset  . Coronary artery disease Unknown     Social History:  Social History   Socioeconomic History  . Marital status: Married    Spouse name: Not on file  . Number of children: Not on file  .  Years of education: Not on file  . Highest education level: Not on file  Occupational History  . Not on file  Social Needs  . Financial resource strain: Not on file  . Food insecurity:    Worry: Not on file    Inability: Not on file  . Transportation needs:    Medical: Not on file    Non-medical: Not on file  Tobacco Use  . Smoking status: Former Smoker    Packs/day: 0.50    Years: 58.00    Pack years: 29.00    Types: Cigarettes    Last attempt to quit: 07/26/2010    Years since quitting: 8.1  . Smokeless tobacco: Never Used  Substance and Sexual Activity  . Alcohol use: No    Alcohol/week: 0.0 standard drinks  . Drug use: No  . Sexual activity: Never  Lifestyle  . Physical activity:    Days per  week: Not on file    Minutes per session: Not on file  . Stress: Not on file  Relationships  . Social connections:    Talks on phone: Not on file    Gets together: Not on file    Attends religious service: Not on file    Active member of club or organization: Not on file    Attends meetings of clubs or organizations: Not on file    Relationship status: Not on file  Other Topics Concern  . Not on file  Social History Narrative  . Not on file    Allergies:  Allergies  Allergen Reactions  . Azithromycin     Per MAR  . Penicillins     Tolerated Rocephin (July 2015)  . Tetracycline     Per MAR    Metabolic Disorder Labs: No results found for: HGBA1C, MPG No results found for: PROLACTIN Lab Results  Component Value Date   CHOL  09/06/2008    164        ATP III CLASSIFICATION:  <200     mg/dL   Desirable  191-478  mg/dL   Borderline High  >=295    mg/dL   High          TRIG 86 09/06/2008   HDL 49 09/06/2008   CHOLHDL 3.3 09/06/2008   VLDL 17 09/06/2008   LDLCALC  09/06/2008    98        Total Cholesterol/HDL:CHD Risk Coronary Heart Disease Risk Table                     Men   Women  1/2 Average Risk   3.4   3.3  Average Risk       5.0   4.4  2 X Average Risk   9.6   7.1  3 X Average Risk  23.4   11.0        Use the calculated Patient Ratio above and the CHD Risk Table to determine the patient's CHD Risk.        ATP III CLASSIFICATION (LDL):  <100     mg/dL   Optimal  621-308  mg/dL   Near or Above                    Optimal  130-159  mg/dL   Borderline  657-846  mg/dL   High  >962     mg/dL   Very High   No results found for: TSH  Therapeutic Level Labs: No results found for:  LITHIUM No results found for: VALPROATE No components found for:  CBMZ  Current Medications: Current Outpatient Medications  Medication Sig Dispense Refill  . albuterol (PROVENTIL HFA;VENTOLIN HFA) 108 (90 BASE) MCG/ACT inhaler Inhale 2 puffs into the lungs every 6 (six) hours  as needed for wheezing or shortness of breath.    Marland Kitchen amLODipine (NORVASC) 5 MG tablet Take 5 mg by mouth daily.    Marland Kitchen aspirin EC 81 MG tablet Take 81 mg by mouth daily.    Marland Kitchen atorvastatin (LIPITOR) 10 MG tablet TAKE 1 TABLET BY MOUTH AT BEDTIME. 28 tablet 11  . [START ON 11/09/2018] busPIRone (BUSPAR) 10 MG tablet Take 1 tablet (10 mg total) by mouth 3 (three) times daily as needed (anxiety). 270 tablet 0  . [START ON 10/05/2018] escitalopram (LEXAPRO) 10 MG tablet Take 1 tablet (10 mg total) by mouth daily. 90 tablet 0  . FARXIGA 5 MG TABS tablet Take 1 tablet by mouth daily.    . furosemide (LASIX) 40 MG tablet Take by mouth. Taking 1.5 tabs QD    . glimepiride (AMARYL) 2 MG tablet Take 1 tablet by mouth 2 (two) times daily.    Marland Kitchen HYDROcodone-acetaminophen (NORCO/VICODIN) 5-325 MG per tablet Take 1 tablet by mouth every 4 (four) hours as needed for moderate pain. (Patient taking differently: Take 1 tablet by mouth 2 (two) times daily. ) 30 tablet 0  . ipratropium-albuterol (DUONEB) 0.5-2.5 (3) MG/3ML SOLN Take 3 mLs by nebulization 4 (four) times daily as needed. Shortness of breath    . JANUVIA 50 MG tablet Take 1 tablet by mouth daily.    Melene Muller ON 10/03/2018] lamoTRIgine (LAMICTAL) 100 MG tablet Take 1 tablet (100 mg total) by mouth at bedtime. 90 tablet 0  . memantine (NAMENDA XR) 14 MG CP24 24 hr capsule Take 14 mg by mouth daily.    . metoprolol tartrate (LOPRESSOR) 25 MG tablet TAKE (1/2) TABLET BY MOUTH TWICE DAILY. 28 tablet 11  . [START ON 10/03/2018] mirtazapine (REMERON) 15 MG tablet Take 1 tablet (15 mg total) by mouth at bedtime. 90 tablet 0  . omeprazole (PRILOSEC) 40 MG capsule Take 20 mg by mouth daily.     Melene Muller ON 10/03/2018] QUEtiapine (SEROQUEL) 25 MG tablet Take 1 tablet (25 mg total) by mouth at bedtime. 90 tablet 0  . solifenacin (VESICARE) 10 MG tablet Take 10 mg by mouth daily.      Marland Kitchen VITAMIN D, CHOLECALCIFEROL, PO Take 1 tablet by mouth daily.     No current  facility-administered medications for this visit.      Musculoskeletal: Strength & Muscle Tone: within normal limits Gait & Station: normal Patient leans: N/A  Psychiatric Specialty Exam: Review of Systems  Psychiatric/Behavioral: Negative for depression, hallucinations, memory loss, substance abuse and suicidal ideas. The patient is nervous/anxious and has insomnia.   All other systems reviewed and are negative.   Blood pressure 118/78, pulse 90, height 5\' 4"  (1.626 m), weight 215 lb (97.5 kg), SpO2 93 %.Body mass index is 36.9 kg/m.  General Appearance: Fairly Groomed  Eye Contact:  Good  Speech:  Clear and Coherent  Volume:  Normal  Mood:  "better"  Affect:  Appropriate, Congruent and calm  Thought Process:  Coherent  Orientation:  Full (Time, Place, and Person)  Thought Content: Logical   Suicidal Thoughts:  No  Homicidal Thoughts:  No  Memory:  Immediate;   Good  Judgement:  Good  Insight:  Fair  Psychomotor Activity:  Normal  Concentration:  Concentration: Good and Attention Span: Good  Recall:  Good  Fund of Knowledge: Good  Language: Good  Akathisia:  No  Handed:  Right  AIMS (if indicated): no tremors  Assets:  Communication Skills Desire for Improvement  ADL's:  Intact  Cognition: WNL  Sleep:  Fair   Screenings: PHQ2-9     [facility]  PHQ-2 Total Score  0       Assessment and Plan:  Brenda Mcdonald is a 78 y.o. year old female with a history of bipolar II disorder, major neurocognitive disorder,hypertension,  CAD,COPD, TIA, type II DM  , who presents for follow up appointment for Bipolar II disorder (HCC)  # Bipolar II disorder The patient reports overall improvement in mood symptoms since the last appointment.  Will continue lamotrigine to target mood dysregulation.  Discussed potential risk of Stevens-Johnson syndrome.  Will continue quetiapine to target bipolar 2 disorder.   Discussed potential metabolic side effect.  Will continue Lexapro and mirtazapine to target depression.  Discussed potential metabolic side effect.  Will continue BuSpar to target anxiety.  Noted that patient reports weight gain; discussed with the patient that we consider tapering down off either quetiapine or mirtazapine in the future. (will continue for now given patient has strong concern of adjusting her medication despite risk of polypharmacy).  Discussed behavioral activation.   # major neurocognitive disorder Patient does have cognitive deficits, as characterized in [place]. She is on memantine, prescribed by PCP.   Plan I have reviewed and updated plans as below 1. Continue mirtazapine 15 mg at night 2. Continue lexapro 10 mg daily  3.Continuelamotrigine100mg  daily 4. Continue quetiapine 25 mg night 5. Continue buspar 10 mg three times a day as needed for anxiety  6. Return to clinic in four months for 30 mins 7.(She is on memantine 14 mg daily)  The patient demonstrates the following risk factors for suicide: Chronic risk factors for suicide include: psychiatric disorder of bipolar disorder. Acute risk factorsfor suicide include: unemployment and social withdrawal/isolation. Protective factorsfor this patient include: positive social support, coping skills. Considering these factors, the overall suicide risk at this point appears to be low. Patient isappropriate for outpatient follow up.  The duration of this appointment visit was 25 minutes of face-to-face time with the patient.  Greater than 50% of this time was spent in counseling, explanation of  diagnosis, planning of further management, and coordination of care.  [name] Yeraldine Forney, MD 09/11/2018, 3:31 PM

## 2018-09-06 ENCOUNTER — Other Ambulatory Visit (HOSPITAL_COMMUNITY): Payer: Self-pay | Admitting: Psychiatry

## 2018-09-06 MED ORDER — ESCITALOPRAM OXALATE 10 MG PO TABS: 10.0000 mg | ORAL_TABLET | Freq: Every day | ORAL | 0 refills | Status: DC

## 2018-09-06 MED ORDER — LAMOTRIGINE 100 MG PO TABS: 100.0000 mg | ORAL_TABLET | Freq: Every day | ORAL | 0 refills | Status: DC

## 2018-09-06 MED ORDER — QUETIAPINE FUMARATE 25 MG PO TABS: 25.0000 mg | ORAL_TABLET | Freq: Every day | ORAL | 0 refills | Status: DC

## 2018-09-06 MED ORDER — MIRTAZAPINE 15 MG PO TABS: 15.0000 mg | ORAL_TABLET | Freq: Every day | ORAL | 0 refills | Status: DC

## 2018-09-11 ENCOUNTER — Encounter (HOSPITAL_COMMUNITY): Payer: Self-pay | Admitting: Psychiatry

## 2018-09-11 ENCOUNTER — Ambulatory Visit (INDEPENDENT_AMBULATORY_CARE_PROVIDER_SITE_OTHER): Payer: Medicare Other | Admitting: Psychiatry

## 2018-09-11 VITALS — BP 118/78 | HR 90 | Ht 64.0 in | Wt 215.0 lb

## 2018-09-11 DIAGNOSIS — F3181 Bipolar II disorder: Secondary | ICD-10-CM

## 2018-09-11 MED ORDER — MIRTAZAPINE 15 MG PO TABS: 15.0000 mg | ORAL_TABLET | Freq: Every day | ORAL | 0 refills | Status: DC

## 2018-09-11 MED ORDER — QUETIAPINE FUMARATE 25 MG PO TABS: 25.0000 mg | ORAL_TABLET | Freq: Every day | ORAL | 0 refills | Status: DC

## 2018-09-11 MED ORDER — LAMOTRIGINE 100 MG PO TABS: 100.0000 mg | ORAL_TABLET | Freq: Every day | ORAL | 0 refills | Status: DC

## 2018-09-11 MED ORDER — BUSPIRONE HCL 10 MG PO TABS: 10.0000 mg | ORAL_TABLET | Freq: Three times a day (TID) | ORAL | 0 refills | Status: DC | PRN

## 2018-09-11 MED ORDER — ESCITALOPRAM OXALATE 10 MG PO TABS: 10.0000 mg | ORAL_TABLET | Freq: Every day | ORAL | 0 refills | Status: DC

## 2018-09-11 NOTE — Patient Instructions (Addendum)
1. Continue mirtazapine 15 mg at night 2. Continue lexapro 10 mg daily  3.Continuelamotrigine100mg  daily 4. Continue quetiapine 25 mg night 5. Continue buspar 10 mg three times a day as needed for anxiety  6. Return to clinic in four months for 15 mins

## 2018-09-25 DIAGNOSIS — E1142 Type 2 diabetes mellitus with diabetic polyneuropathy: Secondary | ICD-10-CM | POA: Diagnosis not present

## 2018-09-25 DIAGNOSIS — E1122 Type 2 diabetes mellitus with diabetic chronic kidney disease: Secondary | ICD-10-CM | POA: Diagnosis not present

## 2018-09-25 DIAGNOSIS — E1165 Type 2 diabetes mellitus with hyperglycemia: Secondary | ICD-10-CM | POA: Diagnosis not present

## 2018-09-25 DIAGNOSIS — Z299 Encounter for prophylactic measures, unspecified: Secondary | ICD-10-CM | POA: Diagnosis not present

## 2018-09-25 DIAGNOSIS — I10 Essential (primary) hypertension: Secondary | ICD-10-CM | POA: Diagnosis not present

## 2018-09-25 DIAGNOSIS — N183 Chronic kidney disease, stage 3 (moderate): Secondary | ICD-10-CM | POA: Diagnosis not present

## 2018-09-25 DIAGNOSIS — Z6835 Body mass index (BMI) 35.0-35.9, adult: Secondary | ICD-10-CM | POA: Diagnosis not present

## 2018-09-26 ENCOUNTER — Ambulatory Visit (INDEPENDENT_AMBULATORY_CARE_PROVIDER_SITE_OTHER): Payer: Medicare Other | Admitting: Psychiatry

## 2018-09-26 DIAGNOSIS — F3181 Bipolar II disorder: Secondary | ICD-10-CM

## 2018-09-26 NOTE — Progress Notes (Signed)
      THERAPIST PROGRESS NOTE                   Session Time:  Tuesday 09/26/2018 3:02 PM -  3:45  PM                                          Participation Level: Active  Behavioral Response: CasualAlert/talkative, loud speech,   Type of Therapy: Individual Therapy  Treatment Goals addressed: learn how to cope with feelings of depression   Interventions: Supportive/CBT  Summary: Brenda Mcdonald is a 78 y.o. female who is referred for services by psychiatrist Dr. Vanetta Shawl  due to patient experiencing symptoms of depression. She has a history of multiple psychiatric admissions for depression and hallucinations. She recently began seeing psychiatrist Dr. Vanetta Shawl for medication management. Patient states she has had nerve problems all of her life and says she has been in and out of the hospital. She reports needing somebody to talk to and says she also has depression at times.  Patient last was seen 4 weeks ago. She reports feeling better and states feeling almost hyper. She says others have commented she talks a lot and her voice is loud. She says she can't be still. She reports symptoms have been present for about a week. She states she is working with Dr. Vanetta Shawl on this. She reports decreased worry about daughter as she stayed with patient for 2 weeks last month. Patient reports enjoying visit very much. She also reports being glad she now has both hearing aids and reports engaging in conversations more often. She continues to express frustration husband doesn't talk much. She has not developed plan with son to take her to church as she says he doesn't want to go to church. She states she could go with niece to church but hating to bother her. She reports niece may get tired of her. Patient reports nurturing her spirituality at home by reading bible and inspirational books.   Therapist Response:  Discussed symptoms, assisted patient identify relaxation techniques to cope with increased restlessness and  provided patient with handout on relaxation techniques, assigned patient to practice techniques, praised and reinforced patient's efforts to nurture spirituality at home, discussed effects,  explored other options for patient to attend church, developed plan for patient to ask son to take her to niece's home and then go with niece to church, assisted patient identify, challenge, and replace unhelpful thoughts about asking niece about going to church with helpful thoughts   Plan: Return again in 4  weeks.  Diagnosis: Axis I: Major depressive disorder, recurrent, moderate    Axis II: Deferred    [name], LCSW 09/26/2018

## 2018-10-24 ENCOUNTER — Ambulatory Visit (HOSPITAL_COMMUNITY): Payer: Medicare Other | Admitting: Psychiatry

## 2018-10-31 ENCOUNTER — Telehealth: Payer: Self-pay | Admitting: Cardiology

## 2018-10-31 DIAGNOSIS — Z713 Dietary counseling and surveillance: Secondary | ICD-10-CM | POA: Diagnosis not present

## 2018-10-31 DIAGNOSIS — I10 Essential (primary) hypertension: Secondary | ICD-10-CM | POA: Diagnosis not present

## 2018-10-31 DIAGNOSIS — Z299 Encounter for prophylactic measures, unspecified: Secondary | ICD-10-CM | POA: Diagnosis not present

## 2018-10-31 DIAGNOSIS — R350 Frequency of micturition: Secondary | ICD-10-CM | POA: Diagnosis not present

## 2018-10-31 MED ORDER — METOPROLOL TARTRATE 25 MG PO TABS: ORAL_TABLET | ORAL | 3 refills | Status: DC

## 2018-10-31 MED ORDER — ATORVASTATIN CALCIUM 10 MG PO TABS: 10.0000 mg | ORAL_TABLET | Freq: Every day | ORAL | 1 refills | Status: DC

## 2018-10-31 NOTE — Telephone Encounter (Signed)
°*  STAT* If patient is at the pharmacy, call can be transferred to refill team.   1. Which medications need to be refilled? atorvastatin (LIPITOR) 10 MG tablet  metoprolol tartrate (LOPRESSOR) 25 MG tablet     2. Which pharmacy/location (including street and city if local pharmacy) is medication to be sent to? [facility] 3. Do they need a 30 day or 90 day supply? 90

## 2018-10-31 NOTE — Telephone Encounter (Signed)
meds refilled 90 day per request

## 2018-11-06 ENCOUNTER — Other Ambulatory Visit: Payer: Self-pay

## 2018-11-06 MED ORDER — ATORVASTATIN CALCIUM 10 MG PO TABS: 10.0000 mg | ORAL_TABLET | Freq: Every day | ORAL | 3 refills | Status: DC

## 2018-11-06 NOTE — Telephone Encounter (Signed)
refilled atorvastatin 90 days

## 2018-11-07 ENCOUNTER — Ambulatory Visit (INDEPENDENT_AMBULATORY_CARE_PROVIDER_SITE_OTHER): Payer: Medicare Other | Admitting: Psychiatry

## 2018-11-07 ENCOUNTER — Other Ambulatory Visit: Payer: Self-pay

## 2018-11-07 DIAGNOSIS — Z87891 Personal history of nicotine dependence: Secondary | ICD-10-CM | POA: Diagnosis not present

## 2018-11-07 DIAGNOSIS — Z713 Dietary counseling and surveillance: Secondary | ICD-10-CM | POA: Diagnosis not present

## 2018-11-07 DIAGNOSIS — Z79899 Other long term (current) drug therapy: Secondary | ICD-10-CM | POA: Diagnosis not present

## 2018-11-07 DIAGNOSIS — Z299 Encounter for prophylactic measures, unspecified: Secondary | ICD-10-CM | POA: Diagnosis not present

## 2018-11-07 DIAGNOSIS — F331 Major depressive disorder, recurrent, moderate: Secondary | ICD-10-CM

## 2018-11-07 DIAGNOSIS — M1990 Unspecified osteoarthritis, unspecified site: Secondary | ICD-10-CM | POA: Diagnosis not present

## 2018-11-07 DIAGNOSIS — Z6835 Body mass index (BMI) 35.0-35.9, adult: Secondary | ICD-10-CM | POA: Diagnosis not present

## 2018-11-07 NOTE — Progress Notes (Signed)
Virtual Visit via Telephone Note  I connected with Brenda Mcdonald on 11/07/18 at  3:00 PM EDT by telephone and verified that I am speaking with the correct person using two identifiers.   I discussed the limitations, risks, security and privacy concerns of performing an evaluation and management service by telephone and the availability of in person appointments. I also discussed with the patient that there may be a patient responsible charge related to this service. The patient expressed understanding and agreed to proceed.  I provided 30 minutes of non-face-to-face time during this encounter.   [name], LCSW       THERAPIST PROGRESS NOTE                   Session Time:  Tuesday 11/07/2018 3:00 PM - 3:30 PM                                          Participation Level: Active  Behavioral Response: CasualAlert/talkative, loud speech,   Type of Therapy: Individual Therapy  Treatment Goals addressed: learn how to cope with feelings of depression   Interventions: Supportive/CBT     Summary: Brenda Mcdonald is a 78 y.o. female who is referred for services by psychiatrist Dr. Vanetta Shawl  due to patient experiencing symptoms of depression. She has a history of multiple psychiatric admissions for depression and hallucinations. She recently began seeing psychiatrist Dr. Vanetta Shawl for medication management. Patient states she has had nerve problems all of her life and says she has been in and out of the hospital. She reports needing somebody to talk to and says she also has depression at times.  Patient last was seen 4 -5  weeks ago. She reports mood has been up and down. She reports having auditory hallucinations of songs for the past month. She is very distressed by this and requested Dr. Vanetta Shawl contact her regarding this.  She reports additional stress related  to having a UTI and increased arthritic pain. She reports continued worry about a variety of issues and reports she has been using  self-talk to try to cope. She reports she hasn't left her home much due to the coronavirus. She says she sometimes sits on the porch. She hasn't pursued attending church due to social distancing but did try to connect with her church via live stream for Easter Sunday with help from a relative. She reports enjoying this until she lost connection with audio. She reports wanting to do this again but not knowing how to do it.  Her youngest daughter along with her husband is residing with her temporarily but plan to move into their own home near patient in the near future. She reports continued contact with her other  children via phone.  Suicidal/Homicidal: No  Therapist Response:  Discussed symptoms,discussed stressors, facilitated expression of thoughts and feelings, validated feelings, assisted patient identify pleasurable activities she would like to increase, developed plan with patient for her to sit outside on porch daily weather permitting, also developed plan for patient to ask daughter assist and teach her how to access church services on her phone, discussed concerns regarding hallucinations and informed patient therapist would share information with Dr. Vanetta Shawl, assigned patient to review relaxation techniques provided in last session. Plan: Return again in 4  weeks.  Diagnosis: Axis I: Major depressive disorder, recurrent, moderate    Axis II: Deferred  [name], LCSW 11/07/2018

## 2018-11-08 ENCOUNTER — Telehealth (HOSPITAL_COMMUNITY): Payer: Self-pay | Admitting: Psychiatry

## 2018-11-08 NOTE — Progress Notes (Signed)
Virtual Visit via Telephone Note  I connected with DELLE QUARTEY on 11/10/18 at  9:30 AM EDT by telephone and verified that I am speaking with the correct person using two identifiers.   I discussed the limitations, risks, security and privacy concerns of performing an evaluation and management service by telephone and the availability of in person appointments. I also discussed with the patient that there may be a patient responsible charge related to this service. The patient expressed understanding and agreed to proceed.    I discussed the assessment and treatment plan with the patient. The patient was provided an opportunity to ask questions and all were answered. The patient agreed with the plan and demonstrated an understanding of the instructions.   The patient was advised to call back or seek an in-person evaluation if the symptoms worsen or if the condition fails to improve as anticipated.  I provided 25 minutes of non-face-to-face time during this encounter.   Neysa Hotter, MD    [facility] MD/PA/NP OP Progress Note  11/10/2018 9:59 AM Brenda Mcdonald  MRN:  [id number]  Chief Complaint:  Chief Complaint    Follow-up; Other     HPI:  This is a follow-up visit for bipolar 2 disorder.  She states that she has not changed any medication.  She thought that the pharmacy would send a new prescription.  She continues to hear music, mainly during the day.  She denies any voices or VH.  She tries to be active.  She feels frustrated that her husband does not go outside, although she enjoys outdoors.  Although they may watch TV together, they watches different program.  She has joined [employer] and watch a Environmental health practitioner.  She found it very helpful.  She has not gone to church for many years due to lack of transportation.  She is willing to find out about anything which might be available on the phone/video.  She does not take a walk as she is afraid of fall.  She agrees to get sunlight at least  sitting in the porch or by the window. 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 She enjoyed helping people. She has occasional insomnia.  She feels depressed.  She has fair concentration and appetite.  She denies SI.  She feels anxious and tense.  She denies panic attacks.  She feels "hyper" at times, although she is unsure if it is anxiety.  She denies decreased need for sleep.   Her daughter on the phone denies any concern about the patient, except that the patient tends to be "hyped up." Her daughter agrees to help the patient increase the dose of quetiapine.    Visit Diagnosis:    ICD-10-CM   1. Bipolar II disorder (HCC) F31.81     Past Psychiatric History: Please see initial evaluation for full details. I have reviewed the history. No updates at this time.     Past Medical History:  Past Medical History:  Diagnosis Date  . Anxiety   . Arthritis   . Bipolar disorder (HCC)   . COPD (chronic obstructive pulmonary disease) (HCC)   . Coronary atherosclerosis of native coronary artery    Nonobstructive  . Depression   . Diabetes mellitus, type 2 (HCC)   . Dysphagia   . Essential hypertension, benign   . GERD (gastroesophageal reflux disease)   . Pneumonia   . Pulmonary nodule July 2009   Nonspecific 5 mm right middle lobe  .  TIA (transient ischemic attack)     Past Surgical History:  Procedure Laterality Date  . ABDOMINAL HYSTERECTOMY    . APPENDECTOMY    . CARDIAC CATHETERIZATION  11/2010  . CATARACT EXTRACTION, BILATERAL Bilateral   . CHOLECYSTECTOMY    . DILATION AND CURETTAGE OF UTERUS    . INCISION AND DRAINAGE Left 03/12/2014   Procedure: LEFT INCISION/DRAINAGE DEEP ABSCESS/BURSA/HEMATOMA THIGH/KNEE REGION;  Surgeon: [name], MD;  Location: [facility] OR;  Service: Orthopedics;  Laterality: Left;  . KNEE ARTHROSCOPY Left   . ORIF DISTAL FEMUR FRACTURE Left 01/24/2014  . ORIF FEMUR FRACTURE Left 01/24/2014   Procedure: OPEN REDUCTION INTERNAL  FIXATION (ORIF) DISTAL FEMUR FRACTURE;  Surgeon: [name], MD;  Location: [facility] OR;  Service: Orthopedics;  Laterality: Left;  . TIBIA FRACTURE SURGERY Left ~ 1959  . TOTAL KNEE ARTHROPLASTY Left 2000  . VESICOVAGINAL FISTULA CLOSURE W/ TAH      Family Psychiatric History: Please see initial evaluation for full details. I have reviewed the history. No updates at this time.     Family History:  Family History  Problem Relation Age of Onset  . Coronary artery disease Unknown     Social History:  Social History   Socioeconomic History  . Marital status: Married    Spouse name: Not on file  . Number of children: Not on file  . Years of education: Not on file  . Highest education level: Not on file  Occupational History  . Not on file  Social Needs  . Financial resource strain: Not on file  . Food insecurity:    Worry: Not on file    Inability: Not on file  . Transportation needs:    Medical: Not on file    Non-medical: Not on file  Tobacco Use  . Smoking status: Former Smoker    Packs/day: 0.50    Years: 58.00    Pack years: 29.00    Types: Cigarettes    Last attempt to quit: 07/26/2010    Years since quitting: 8.2  . Smokeless tobacco: Never Used  Substance and Sexual Activity  . Alcohol use: No    Alcohol/week: 0.0 standard drinks  . Drug use: No  . Sexual activity: Never  Lifestyle  . Physical activity:    Days per week: Not on file    Minutes per session: Not on file  . Stress: Not on file  Relationships  . Social connections:    Talks on phone: Not on file    Gets together: Not on file    Attends religious service: Not on file    Active member of club or organization: Not on file    Attends meetings of clubs or organizations: Not on file    Relationship status: Not on file  Other Topics Concern  . Not on file  Social History Narrative  . Not on file    Allergies:  Allergies  Allergen Reactions  . Azithromycin     Per MAR  . Penicillins      Tolerated Rocephin (July 2015)  . Tetracycline     Per MAR    Metabolic Disorder Labs: No results found for: HGBA1C, MPG No results found for: PROLACTIN Lab Results  Component Value Date   CHOL  09/06/2008    164        ATP III CLASSIFICATION:  <200     mg/dL   Desirable  903-833  mg/dL   Borderline High  >=383  mg/dL   High          TRIG 86 09/06/2008   HDL 49 09/06/2008   CHOLHDL 3.3 09/06/2008   VLDL 17 09/06/2008   LDLCALC  09/06/2008    98        Total Cholesterol/HDL:CHD Risk Coronary Heart Disease Risk Table                     Men   Women  1/2 Average Risk   3.4   3.3  Average Risk       5.0   4.4  2 X Average Risk   9.6   7.1  3 X Average Risk  23.4   11.0        Use the calculated Patient Ratio above and the CHD Risk Table to determine the patient's CHD Risk.        ATP III CLASSIFICATION (LDL):  <100     mg/dL   Optimal  [id number]  mg/dL   Near or Above                    Optimal  130-159  mg/dL   Borderline  [id number]  mg/dL   High  >812>190     mg/dL   Very High   No results found for: TSH  Therapeutic Level Labs: No results found for: LITHIUM No results found for: VALPROATE No components found for:  CBMZ  Current Medications: Current Outpatient Medications  Medication Sig Dispense Refill  . albuterol (PROVENTIL HFA;VENTOLIN HFA) 108 (90 BASE) MCG/ACT inhaler Inhale 2 puffs into the lungs every 6 (six) hours as needed for wheezing or shortness of breath.    Marland Kitchen. amLODipine (NORVASC) 5 MG tablet Take 5 mg by mouth daily.    Marland Kitchen. aspirin EC 81 MG tablet Take 81 mg by mouth daily.    Marland Kitchen. atorvastatin (LIPITOR) 10 MG tablet Take 1 tablet (10 mg total) by mouth at bedtime. 90 tablet 3  . busPIRone (BUSPAR) 10 MG tablet Take 1 tablet (10 mg total) by mouth 3 (three) times daily as needed (anxiety). 270 tablet 0  . escitalopram (LEXAPRO) 10 MG tablet Take 1 tablet (10 mg total) by mouth daily. 90 tablet 0  . FARXIGA 5 MG TABS tablet Take 1 tablet by mouth daily.     . furosemide (LASIX) 40 MG tablet Take by mouth. Taking 1.5 tabs QD    . glimepiride (AMARYL) 2 MG tablet Take 1 tablet by mouth 2 (two) times daily.    Marland Kitchen. HYDROcodone-acetaminophen (NORCO/VICODIN) 5-325 MG per tablet Take 1 tablet by mouth every 4 (four) hours as needed for moderate pain. (Patient taking differently: Take 1 tablet by mouth 2 (two) times daily. ) 30 tablet 0  . ipratropium-albuterol (DUONEB) 0.5-2.5 (3) MG/3ML SOLN Take 3 mLs by nebulization 4 (four) times daily as needed. Shortness of breath    . JANUVIA 50 MG tablet Take 1 tablet by mouth daily.    Marland Kitchen. lamoTRIgine (LAMICTAL) 100 MG tablet Take 1 tablet (100 mg total) by mouth at bedtime. 90 tablet 0  . memantine (NAMENDA XR) 14 MG CP24 24 hr capsule Take 14 mg by mouth daily.    . metoprolol tartrate (LOPRESSOR) 25 MG tablet TAKE (1/2) TABLET BY MOUTH TWICE DAILY. 45 tablet 3  . mirtazapine (REMERON) 15 MG tablet Take 1 tablet (15 mg total) by mouth at bedtime. 90 tablet 0  . omeprazole (PRILOSEC) 40 MG capsule Take 20 mg by mouth  daily.     . QUEtiapine (SEROQUEL) 25 MG tablet Take 1 tablet (25 mg total) by mouth at bedtime. 90 tablet 0  . solifenacin (VESICARE) 10 MG tablet Take 10 mg by mouth daily.      Marland Kitchen VITAMIN D, CHOLECALCIFEROL, PO Take 1 tablet by mouth daily.     No current facility-administered medications for this visit.      Musculoskeletal: Strength & Muscle Tone: N/A Gait & Station: N/A Patient leans: N/A  Psychiatric Specialty Exam: Review of Systems  Psychiatric/Behavioral: Positive for depression and hallucinations. Negative for memory loss, substance abuse and suicidal ideas. The patient is nervous/anxious and has insomnia.   All other systems reviewed and are negative.   There were no vitals taken for this visit.There is no height or weight on file to calculate BMI.  General Appearance: NA  Eye Contact:  NA  Speech:  Clear and Coherent  Volume:  Normal  Mood:  Depressed  Affect:  NA  Thought  Process:  Coherent  Orientation:  Full (Time, Place, and Person)  Thought Content: Logical   Suicidal Thoughts:  No  Homicidal Thoughts:  No  Memory:  Immediate;   Fair  Judgement:  Good  Insight:  Fair  Psychomotor Activity:  Normal  Concentration:  Concentration: Good and Attention Span: Good  Recall:  Fair  Fund of Knowledge: Good  Language: Good  Akathisia:  No  Handed:  Right  AIMS (if indicated): not done  Assets:  Communication Skills Desire for Improvement  ADL's:  Intact  Cognition: WNL  Sleep:  Poor   Screenings: PHQ2-9     Office Visit from 04/09/2014 in [facility]  PHQ-2 Total Score  0       Assessment and Plan:  Brenda Mcdonald is a 78 y.o. year old female with a history of bipolar II disorder, major neurocognitive disorder, hypertension,CAD,COPD, TIA, type II DM  , who presents for follow up appointment for Bipolar II disorder (HCC)  # Bipolar II disorder Patient continues to have AH of music.  She has not uptitrated quetiapine, which was discussed a few days ago. Differential includes hallucinations in the context of recent UTI. She doe not significant manic episode to be concerned of psychosis. Discussed with her daughter to help to make changing her medication.  Will try higher dose of quetiapine to target hallucinations and also to target bipolar 2 disorder.  The hope is to lower medication in the future to avoid potential side effect of EPS and metabolic side effect.  Will continue lamotrigine for mood dysregulation.  Discussed risk of Stevens-Johnson syndrome.  Will continue Lexapro to target depression.  Will continue mirtazapine as adjunctive treatment for depression.  Noted that although it is preferable to avoid polypharmacy, she has strong concern of adjusting her medication.  Will continue to discuss.  Discussed behavioral activation.   #  Major neurocognitive disorder Patient does have a cognitive deficits, as  characterized in [place].  She is on memantine, prescribed by PCP.   Plan I have reviewed and updated plans as below 1. Continue lexapro 10 mg daily  2. Continue mirtazapine 15 mg at night 3.Continuelamotrigine100mg  daily 4. Increase quetiapine 50 mg night 5. Continue buspar 10 mg three times a day as needed for anxiety  6. Next appointment: 5/15 at 9:30 for 30 mins, phone visit.  7.(She is on memantine 14 mg daily)  The patient demonstrates the following risk factors for suicide: Chronic risk factors  for suicide include: psychiatric disorder of bipolar disorder. Acute risk factorsfor suicide include: unemployment and social withdrawal/isolation. Protective factorsfor this patient include: positive social support, coping skills. Considering these factors, the overall suicide risk at this point appears to be low. Patient isappropriate for outpatient follow up.  The duration of this appointment visit was 25 minutes of non face-to-face time with the patient.  Greater than 50% of this time was spent in counseling, explanation of  diagnosis, planning of further management, and coordination of care.  Neysa Hotter, MD 11/10/2018, 9:59 AM

## 2018-11-08 NOTE — Telephone Encounter (Signed)
Discussed with the patient given her concern of listening to music. She has been hearing music for the past few weeks. She denies VH or any AH of voices. She had UTI a few weeks ago, and was on cipro, followed by Bactrim (though she was unsure of the medication name). She denies any physical symptoms except low-grade fever.   -Will uptitrate quetiapine 50 mg at night.  Discussed potential risk of drowsiness.  - Will follow up sooner: 4/17 at 9:30 for 30 mins

## 2018-11-10 ENCOUNTER — Other Ambulatory Visit: Payer: Self-pay

## 2018-11-10 ENCOUNTER — Ambulatory Visit (INDEPENDENT_AMBULATORY_CARE_PROVIDER_SITE_OTHER): Payer: Medicare Other | Admitting: Psychiatry

## 2018-11-10 ENCOUNTER — Encounter (HOSPITAL_COMMUNITY): Payer: Self-pay | Admitting: Psychiatry

## 2018-11-10 DIAGNOSIS — F3181 Bipolar II disorder: Secondary | ICD-10-CM | POA: Diagnosis not present

## 2018-11-10 DIAGNOSIS — Z79899 Other long term (current) drug therapy: Secondary | ICD-10-CM | POA: Diagnosis not present

## 2018-11-10 NOTE — Patient Instructions (Addendum)
1. Continue lexapro 10 mg daily  2. Continue mirtazapine 15 mg at night 3.Continuelamotrigine100mg  daily 4. Increase quetiapine 50 mg night 5. Continue buspar 10 mg three times a day as needed for anxiety  6. Next appointment: 5/15 at 9:30 for 30 mins, phone visit.

## 2018-11-16 ENCOUNTER — Telehealth (HOSPITAL_COMMUNITY): Payer: Self-pay

## 2018-11-16 ENCOUNTER — Other Ambulatory Visit (HOSPITAL_COMMUNITY): Payer: Self-pay | Admitting: Psychiatry

## 2018-11-16 MED ORDER — QUETIAPINE FUMARATE 50 MG PO TABS: 50.0000 mg | ORAL_TABLET | Freq: Every day | ORAL | 0 refills | Status: DC

## 2018-11-16 NOTE — Telephone Encounter (Signed)
ordered

## 2018-11-16 NOTE — Telephone Encounter (Signed)
Medication refill - Telephone call with patient to follow-up on message she left that she is in need of a new Seroquel order since was increased to 50 mg at bedtime.  Patient reported she ran out after the previous night's dosage and needs a new order that reflects 50 mg a night to be sent into [facility].  Agreed to send request to Dr. Paulita Cradle.

## 2018-11-16 NOTE — Telephone Encounter (Signed)
Medication management - Telephone call with patient to inform Dr. Vanetta Shawl had sent in the new Seroquel order for 50 mg, one at bedtime, #30 to [facility].

## 2018-11-20 ENCOUNTER — Other Ambulatory Visit (HOSPITAL_COMMUNITY): Payer: Self-pay | Admitting: Psychiatry

## 2018-11-20 MED ORDER — BUSPIRONE HCL 10 MG PO TABS: 10.0000 mg | ORAL_TABLET | Freq: Three times a day (TID) | ORAL | 3 refills | Status: DC | PRN

## 2018-11-20 MED ORDER — ESCITALOPRAM OXALATE 10 MG PO TABS: 10.0000 mg | ORAL_TABLET | Freq: Every day | ORAL | 3 refills | Status: DC

## 2018-11-20 MED ORDER — LAMOTRIGINE 100 MG PO TABS: 100.0000 mg | ORAL_TABLET | Freq: Every day | ORAL | 3 refills | Status: DC

## 2018-11-20 MED ORDER — MIRTAZAPINE 15 MG PO TABS: 15.0000 mg | ORAL_TABLET | Freq: Every day | ORAL | 3 refills | Status: DC

## 2018-11-22 ENCOUNTER — Other Ambulatory Visit: Payer: Self-pay

## 2018-11-22 ENCOUNTER — Ambulatory Visit (HOSPITAL_COMMUNITY): Payer: Medicare Other | Admitting: Psychiatry

## 2018-12-05 NOTE — Progress Notes (Signed)
Virtual Visit via Telephone Note  I connected with Brenda ReaperBarbara N Mcdonald on 12/08/18 at  9:30 AM EDT by telephone and verified that I am speaking with the correct person using two identifiers.   I discussed the limitations, risks, security and privacy concerns of performing an evaluation and management service by telephone and the availability of in person appointments. I also discussed with the patient that there may be a patient responsible charge related to this service. The patient expressed understanding and agreed to proceed.   I discussed the assessment and treatment plan with the patient. The patient was provided an opportunity to ask questions and all were answered. The patient agreed with the plan and demonstrated an understanding of the instructions.   The patient was advised to call back or seek an in-person evaluation if the symptoms worsen or if the condition fails to improve as anticipated.  I provided 25 minutes of non-face-to-face time during this encounter.   Brenda Hottereina [name], MD    [facility] MD/PA/NP OP Progress Note  12/08/2018 9:49 AM Brenda Mcdonald  MRN:  [id number]  Chief Complaint:  Chief Complaint    Follow-up; Depression     HPI:  This is a follow-up appointment for bipolar 2 disorder and neurocognitive disorder.  Patient was asleep when this note Clinical research associatewriter called.  She states that "some days are good, and some days are not."  She occasionally feels alone on someday, although it is difficult for her to explain. She had a good mother's day when her children visited the patient.  Her husband is not doing well.  She states that she tends to feel depressed later in the day, although she does not want to discuss it on the phone.  She has been trying to read books.  She has not looked Facebook anymore (used to enjoy watching a Environmental health practitionervideo of ministry), stating that she does not like "those things."  Although she wishes to see a therapist, she wants to do it in person.  She has insomnia.  She  tends to stay up at 1:00 in the morning. Although she wakes up early in the morning, she sleeps again. She agrees to be awake to have better sleep at night.  She has fair motivation and appetite.  She denies SI.  She denies anxiety or panic attacks.  She does not have AH of music as much as she used to.  She denies VH.   Her daughter is on the phone. She takes care of the patient's medication. Brenda MccreedyBarbara appears to have insomnia. Otherwise, no significant change since the last visit (the daughter states that she is not with the patient all the time.)   Visit Diagnosis:    ICD-10-CM   1. Bipolar II disorder (HCC) F31.81     Past Psychiatric History: Please see initial evaluation for full details. I have reviewed the history. No updates at this time.     Past Medical History:  Past Medical History:  Diagnosis Date  . Anxiety   . Arthritis   . Bipolar disorder (HCC)   . COPD (chronic obstructive pulmonary disease) (HCC)   . Coronary atherosclerosis of native coronary artery    Nonobstructive  . Depression   . Diabetes mellitus, type 2 (HCC)   . Dysphagia   . Essential hypertension, benign   . GERD (gastroesophageal reflux disease)   . Pneumonia   . Pulmonary nodule July 2009   Nonspecific 5 mm right middle lobe  . TIA (transient ischemic attack)  Past Surgical History:  Procedure Laterality Date  . ABDOMINAL HYSTERECTOMY    . APPENDECTOMY    . CARDIAC CATHETERIZATION  11/2010  . CATARACT EXTRACTION, BILATERAL Bilateral   . CHOLECYSTECTOMY    . DILATION AND CURETTAGE OF UTERUS    . INCISION AND DRAINAGE Left 03/12/2014   Procedure: LEFT INCISION/DRAINAGE DEEP ABSCESS/BURSA/HEMATOMA THIGH/KNEE REGION;  Surgeon: [name], MD;  Location: [facility] OR;  Service: Orthopedics;  Laterality: Left;  . KNEE ARTHROSCOPY Left   . ORIF DISTAL FEMUR FRACTURE Left 01/24/2014  . ORIF FEMUR FRACTURE Left 01/24/2014   Procedure: OPEN REDUCTION INTERNAL FIXATION (ORIF) DISTAL FEMUR FRACTURE;   Surgeon: [name], MD;  Location: [facility] OR;  Service: Orthopedics;  Laterality: Left;  . TIBIA FRACTURE SURGERY Left ~ 1959  . TOTAL KNEE ARTHROPLASTY Left 2000  . VESICOVAGINAL FISTULA CLOSURE W/ TAH      Family Psychiatric History: Please see initial evaluation for full details. I have reviewed the history. No updates at this time.     Family History:  Family History  Problem Relation Age of Onset  . Coronary artery disease Unknown     Social History:  Social History   Socioeconomic History  . Marital status: Married    Spouse name: Not on file  . Number of children: Not on file  . Years of education: Not on file  . Highest education level: Not on file  Occupational History  . Not on file  Social Needs  . Financial resource strain: Not on file  . Food insecurity:    Worry: Not on file    Inability: Not on file  . Transportation needs:    Medical: Not on file    Non-medical: Not on file  Tobacco Use  . Smoking status: Former Smoker    Packs/day: 0.50    Years: 58.00    Pack years: 29.00    Types: Cigarettes    Last attempt to quit: 07/26/2010    Years since quitting: 8.3  . Smokeless tobacco: Never Used  Substance and Sexual Activity  . Alcohol use: No    Alcohol/week: 0.0 standard drinks  . Drug use: No  . Sexual activity: Never  Lifestyle  . Physical activity:    Days per week: Not on file    Minutes per session: Not on file  . Stress: Not on file  Relationships  . Social connections:    Talks on phone: Not on file    Gets together: Not on file    Attends religious service: Not on file    Active member of club or organization: Not on file    Attends meetings of clubs or organizations: Not on file    Relationship status: Not on file  Other Topics Concern  . Not on file  Social History Narrative  . Not on file    Allergies:  Allergies  Allergen Reactions  . Azithromycin     Per MAR  . Penicillins     Tolerated Rocephin (July 2015)  .  Tetracycline     Per MAR    Metabolic Disorder Labs: No results found for: HGBA1C, MPG No results found for: PROLACTIN Lab Results  Component Value Date   CHOL  09/06/2008    164        ATP III CLASSIFICATION:  <200     mg/dL   Desirable  161-096  mg/dL   Borderline High  >=045    mg/dL   High  TRIG 86 09/06/2008   HDL 49 09/06/2008   CHOLHDL 3.3 09/06/2008   VLDL 17 09/06/2008   LDLCALC  09/06/2008    98        Total Cholesterol/HDL:CHD Risk Coronary Heart Disease Risk Table                     Men   Women  1/2 Average Risk   3.4   3.3  Average Risk       5.0   4.4  2 X Average Risk   9.6   7.1  3 X Average Risk  23.4   11.0        Use the calculated Patient Ratio above and the CHD Risk Table to determine the patient's CHD Risk.        ATP III CLASSIFICATION (LDL):  <100     mg/dL   Optimal  161-096  mg/dL   Near or Above                    Optimal  130-159  mg/dL   Borderline  045-409  mg/dL   High  >811     mg/dL   Very High   No results found for: TSH  Therapeutic Level Labs: No results found for: LITHIUM No results found for: VALPROATE No components found for:  CBMZ  Current Medications: Current Outpatient Medications  Medication Sig Dispense Refill  . albuterol (PROVENTIL HFA;VENTOLIN HFA) 108 (90 BASE) MCG/ACT inhaler Inhale 2 puffs into the lungs every 6 (six) hours as needed for wheezing or shortness of breath.    Marland Kitchen amLODipine (NORVASC) 5 MG tablet Take 5 mg by mouth daily.    Marland Kitchen aspirin EC 81 MG tablet Take 81 mg by mouth daily.    Marland Kitchen atorvastatin (LIPITOR) 10 MG tablet Take 1 tablet (10 mg total) by mouth at bedtime. 90 tablet 3  . busPIRone (BUSPAR) 10 MG tablet Take 1 tablet (10 mg total) by mouth 3 (three) times daily as needed (anxiety). 84 tablet 3  . escitalopram (LEXAPRO) 10 MG tablet Take 1 tablet (10 mg total) by mouth daily. 28 tablet 3  . FARXIGA 5 MG TABS tablet Take 1 tablet by mouth daily.    . furosemide (LASIX) 40 MG tablet  Take by mouth. Taking 1.5 tabs QD    . glimepiride (AMARYL) 2 MG tablet Take 1 tablet by mouth 2 (two) times daily.    Marland Kitchen HYDROcodone-acetaminophen (NORCO/VICODIN) 5-325 MG per tablet Take 1 tablet by mouth every 4 (four) hours as needed for moderate pain. (Patient taking differently: Take 1 tablet by mouth 2 (two) times daily. ) 30 tablet 0  . ipratropium-albuterol (DUONEB) 0.5-2.5 (3) MG/3ML SOLN Take 3 mLs by nebulization 4 (four) times daily as needed. Shortness of breath    . JANUVIA 50 MG tablet Take 1 tablet by mouth daily.    Marland Kitchen lamoTRIgine (LAMICTAL) 100 MG tablet Take 1 tablet (100 mg total) by mouth at bedtime. 28 tablet 3  . memantine (NAMENDA XR) 14 MG CP24 24 hr capsule Take 14 mg by mouth daily.    . metoprolol tartrate (LOPRESSOR) 25 MG tablet TAKE (1/2) TABLET BY MOUTH TWICE DAILY. 45 tablet 3  . mirtazapine (REMERON) 15 MG tablet Take 1 tablet (15 mg total) by mouth at bedtime. 28 tablet 3  . omeprazole (PRILOSEC) 40 MG capsule Take 20 mg by mouth daily.     . QUEtiapine (SEROQUEL) 50 MG tablet Take 1  tablet (50 mg total) by mouth at bedtime. 30 tablet 0  . solifenacin (VESICARE) 10 MG tablet Take 10 mg by mouth daily.      Marland Kitchen. VITAMIN D, CHOLECALCIFEROL, PO Take 1 tablet by mouth daily.     No current facility-administered medications for this visit.      Musculoskeletal: Strength & Muscle Tone: N/A Gait & Station: N/A Patient leans: N/A  Psychiatric Specialty Exam: Review of Systems  Psychiatric/Behavioral: Positive for depression. Negative for hallucinations, memory loss, substance abuse and suicidal ideas. The patient is nervous/anxious and has insomnia.   All other systems reviewed and are negative.   There were no vitals taken for this visit.There is no height or weight on file to calculate BMI.  General Appearance: NA  Eye Contact:  NA  Speech:  Clear and Coherent  Volume:  Normal  Mood:  "somedays are good"  Affect:  NA  Thought Process:  Coherent   Orientation:  Full (Time, Place, and Person)  Thought Content: Logical   Suicidal Thoughts:  No  Homicidal Thoughts:  No  Memory:  Immediate;   Good  Judgement:  Fair  Insight:  Present  Psychomotor Activity:  Normal  Concentration:  Concentration: Good and Attention Span: Good  Recall:  Good  Fund of Knowledge: Good  Language: Good  Akathisia:  No  Handed:  Right  AIMS (if indicated): not done  Assets:  Communication Skills Desire for Improvement  ADL's:  Intact  Cognition: WNL  Sleep:  Poor   Screenings: PHQ2-9     [facility]  PHQ-2 Total Score  0       Assessment and Plan:  Brenda ReaperBarbara N Brandenberger is a 78 y.o. year old female with a history of bipolar II disorder, major neurocognitive disorder, hypertension,CAD,COPD, TIA, type II DM , who presents for follow up appointment for Bipolar II disorder (HCC)  # Bipolar II disorder There has been improvement in the AH of music since up titration of quetiapine.  She has not had any significant mood symptoms/manic episodes to be concerned of psychosis.  Will continue quetiapine to target hallucinations and to target bipolar 2 disorder.  Will consider lowering this medication in the future to avoid potential side effect of EPS and metabolic side effect.  Will continue lamotrigine for mood dysregulation.  Discussed risk of Stevens-Johnson syndrome.  Will continue Lexapro to target depression.  Will continue mirtazapine as adjunctive treatment for depression.  Noted that although it is preferable to avoid polypharmacy, she has strong preference to stay on her current medication.  Will continue to discuss as needed.  Discussed behavioral activation.   # major neurocognitive disorder She does have a cognitive deficits, as characterized in [place].  She is on memantine, prescribed by PCP.  Will continue to monitor.   Plan I have reviewed and updated plans as below 1.  Continue lexapro 10 mg daily  2. Continue mirtazapine 15 mg at night 3.Continuelamotrigine100mg  daily 4. Continue quetiapine 50 mg night 5. Continue Buspar 10 mg three times a day as needed for anxiety  6. Next appointment: 7/9 at 2 PM, 30 mins, phone 7.(She is on memantine 14 mg daily)  The duration of this appointment visit was 25 minutes of non face-to-face time with the patient.  Greater than 50% of this time was spent in counseling, explanation of  diagnosis, planning of further management, and coordination of care.  The patient demonstrates the following risk  factors for suicide: Chronic risk factors for suicide include: psychiatric disorder of bipolar disorder. Acute risk factorsfor suicide include: unemployment and social withdrawal/isolation. Protective factorsfor this patient include: positive social support, coping skills. Considering these factors, the overall suicide risk at this point appears to be low. Patient isappropriate for outpatient follow up.  Brenda Hotter, MD 12/08/2018, 9:49 AM

## 2018-12-08 ENCOUNTER — Ambulatory Visit (INDEPENDENT_AMBULATORY_CARE_PROVIDER_SITE_OTHER): Payer: Medicare Other | Admitting: Psychiatry

## 2018-12-08 ENCOUNTER — Other Ambulatory Visit: Payer: Self-pay

## 2018-12-08 ENCOUNTER — Encounter (HOSPITAL_COMMUNITY): Payer: Self-pay | Admitting: Psychiatry

## 2018-12-08 DIAGNOSIS — F3181 Bipolar II disorder: Secondary | ICD-10-CM

## 2018-12-08 MED ORDER — QUETIAPINE FUMARATE 50 MG PO TABS: 50.0000 mg | ORAL_TABLET | Freq: Every day | ORAL | 2 refills | Status: DC

## 2018-12-08 NOTE — Patient Instructions (Signed)
1. Continue lexapro 10 mg daily  2. Continue mirtazapine 15 mg at night 3.Continuelamotrigine100mg  daily 4. Continue quetiapine 50 mg night 5. Continue buspar 10 mg three times a day as needed for anxiety  6. Next appointment: 7/9 at 2 PM

## 2018-12-15 DIAGNOSIS — Z299 Encounter for prophylactic measures, unspecified: Secondary | ICD-10-CM | POA: Diagnosis not present

## 2018-12-15 DIAGNOSIS — E1165 Type 2 diabetes mellitus with hyperglycemia: Secondary | ICD-10-CM | POA: Diagnosis not present

## 2018-12-15 DIAGNOSIS — I10 Essential (primary) hypertension: Secondary | ICD-10-CM | POA: Diagnosis not present

## 2018-12-15 DIAGNOSIS — J449 Chronic obstructive pulmonary disease, unspecified: Secondary | ICD-10-CM | POA: Diagnosis not present

## 2018-12-15 DIAGNOSIS — Z79899 Other long term (current) drug therapy: Secondary | ICD-10-CM | POA: Diagnosis not present

## 2018-12-15 DIAGNOSIS — F319 Bipolar disorder, unspecified: Secondary | ICD-10-CM | POA: Diagnosis not present

## 2018-12-15 DIAGNOSIS — M1990 Unspecified osteoarthritis, unspecified site: Secondary | ICD-10-CM | POA: Diagnosis not present

## 2018-12-15 DIAGNOSIS — Z6835 Body mass index (BMI) 35.0-35.9, adult: Secondary | ICD-10-CM | POA: Diagnosis not present

## 2019-01-01 DIAGNOSIS — N183 Chronic kidney disease, stage 3 (moderate): Secondary | ICD-10-CM | POA: Diagnosis not present

## 2019-01-01 DIAGNOSIS — E1122 Type 2 diabetes mellitus with diabetic chronic kidney disease: Secondary | ICD-10-CM | POA: Diagnosis not present

## 2019-01-01 DIAGNOSIS — E1142 Type 2 diabetes mellitus with diabetic polyneuropathy: Secondary | ICD-10-CM | POA: Diagnosis not present

## 2019-01-01 DIAGNOSIS — E1165 Type 2 diabetes mellitus with hyperglycemia: Secondary | ICD-10-CM | POA: Diagnosis not present

## 2019-01-01 DIAGNOSIS — Z6835 Body mass index (BMI) 35.0-35.9, adult: Secondary | ICD-10-CM | POA: Diagnosis not present

## 2019-01-01 DIAGNOSIS — Z299 Encounter for prophylactic measures, unspecified: Secondary | ICD-10-CM | POA: Diagnosis not present

## 2019-01-11 ENCOUNTER — Ambulatory Visit (HOSPITAL_COMMUNITY): Payer: Medicare Other | Admitting: Psychiatry

## 2019-01-24 NOTE — Progress Notes (Signed)
Virtual Visit via Telephone Note  I connected with Brenda Mcdonald on 02/01/19 at  2:00 PM EDT by telephone and verified that I am speaking with the correct person using two identifiers.   I discussed the limitations, risks, security and privacy concerns of performing an evaluation and management service by telephone and the availability of in person appointments. I also discussed with the patient that there may be a patient responsible charge related to this service. The patient expressed understanding and agreed to proceed.  I discussed the assessment and treatment plan with the patient. The patient was provided an opportunity to ask questions and all were answered. The patient agreed with the plan and demonstrated an understanding of the instructions.   The patient was advised to call back or seek an in-person evaluation if the symptoms worsen or if the condition fails to improve as anticipated.  I provided 15 minutes of non-face-to-face time during this encounter.   Norman Clay, MD    [facility] MD/PA/NP OP Progress Note  02/01/2019 2:24 PM EMIRETH COCKERHAM  MRN:  [id number]  Chief Complaint:  Chief Complaint    Follow-up; Other     HPI:  This is a follow-up appointment for bipolar 2 disorder.  She states that she stays depressed. She feels anxious at times. She denies panic attacks. She occasionally has passive SI, although she denies any plan or intent. She sleep well. She hopes to be able to visit the office in person. She asks [name] to answer the rest of the questions.   [name], the patient niece is on the phone.  She states that Brenda Mcdonald has been doing good. There was a time when Shellye appeared to be more depressed and stays in the bed. She is now getting better; she sits around, and cleans the house as others do. No known AH, VH, or any safety concern. She sleeps well. She has decreased appetite. [name] makes sure for patient to take medication every day.    Visit Diagnosis:   ICD-10-CM   1. Bipolar II disorder ([place])  F31.81     Past Psychiatric History: Please see initial evaluation for full details. I have reviewed the history. No updates at this time.     Past Medical History:  Past Medical History:  Diagnosis Date  . Anxiety   . Arthritis   . Bipolar disorder ([name])   . COPD (chronic obstructive pulmonary disease) ([place])   . Coronary atherosclerosis of native coronary artery    Nonobstructive  . Depression   . Diabetes mellitus, type 2 ([place])   . Dysphagia   . Essential hypertension, benign   . GERD (gastroesophageal reflux disease)   . Pneumonia   . Pulmonary nodule July 2009   Nonspecific 5 mm right middle lobe  . TIA (transient ischemic attack)     Past Surgical History:  Procedure Laterality Date  . ABDOMINAL HYSTERECTOMY    . APPENDECTOMY    . CARDIAC CATHETERIZATION  11/2010  . CATARACT EXTRACTION, BILATERAL Bilateral   . CHOLECYSTECTOMY    . DILATION AND CURETTAGE OF UTERUS    . INCISION AND DRAINAGE Left 03/12/2014   Procedure: LEFT INCISION/DRAINAGE DEEP ABSCESS/BURSA/HEMATOMA THIGH/KNEE REGION;  Surgeon: Renette Butters, MD;  Location: [place];  Service: Orthopedics;  Laterality: Left;  . KNEE ARTHROSCOPY Left   . ORIF DISTAL FEMUR FRACTURE Left 01/24/2014  . ORIF FEMUR FRACTURE Left 01/24/2014   Procedure: OPEN REDUCTION INTERNAL FIXATION (ORIF) DISTAL FEMUR FRACTURE;  Surgeon: Renette Butters,  MD;  Location: [facility];  Service: Orthopedics;  Laterality: Left;  . TIBIA FRACTURE SURGERY Left ~ 1959  . TOTAL KNEE ARTHROPLASTY Left 2000  . VESICOVAGINAL FISTULA CLOSURE W/ TAH      Family Psychiatric History: Please see initial evaluation for full details. I have reviewed the history. No updates at this time.     Family History:  Family History  Problem Relation Age of Onset  . Coronary artery disease Unknown     Social History:  Social History   Socioeconomic History  . Marital status: Married    Spouse name: Not on file  .  Number of children: Not on file  . Years of education: Not on file  . Highest education level: Not on file  Occupational History  . Not on file  Social Needs  . Financial resource strain: Not on file  . Food insecurity    Worry: Not on file    Inability: Not on file  . Transportation needs    Medical: Not on file    Non-medical: Not on file  Tobacco Use  . Smoking status: Former Smoker    Packs/day: 0.50    Years: 58.00    Pack years: 29.00    Types: Cigarettes    Quit date: 07/26/2010    Years since quitting: 8.5  . Smokeless tobacco: Never Used  Substance and Sexual Activity  . Alcohol use: No    Alcohol/week: 0.0 standard drinks  . Drug use: No  . Sexual activity: Never  Lifestyle  . Physical activity    Days per week: Not on file    Minutes per session: Not on file  . Stress: Not on file  Relationships  . Social Musician on phone: Not on file    Gets together: Not on file    Attends religious service: Not on file    Active member of club or organization: Not on file    Attends meetings of clubs or organizations: Not on file    Relationship status: Not on file  Other Topics Concern  . Not on file  Social History Narrative  . Not on file    Allergies:  Allergies  Allergen Reactions  . Azithromycin     Per MAR  . Penicillins     Tolerated Rocephin (July 2015)  . Tetracycline     Per MAR    Metabolic Disorder Labs: No results found for: HGBA1C, MPG No results found for: PROLACTIN Lab Results  Component Value Date   CHOL  09/06/2008    164        ATP III CLASSIFICATION:  <200     mg/dL   Desirable  370-964  mg/dL   Borderline High  >=383    mg/dL   High          TRIG 86 09/06/2008   HDL 49 09/06/2008   CHOLHDL 3.3 09/06/2008   VLDL 17 09/06/2008   LDLCALC  09/06/2008    98        Total Cholesterol/HDL:CHD Risk Coronary Heart Disease Risk Table                     Men   Women  1/2 Average Risk   3.4   3.3  Average Risk       5.0    4.4  2 X Average Risk   9.6   7.1  3 X Average Risk  23.4   11.0  Use the calculated Patient Ratio above and the CHD Risk Table to determine the patient's CHD Risk.        ATP III CLASSIFICATION (LDL):  <100     mg/dL   Optimal  213-086  mg/dL   Near or Above                    Optimal  130-159  mg/dL   Borderline  578-469  mg/dL   High  >629     mg/dL   Very High   No results found for: TSH  Therapeutic Level Labs: No results found for: LITHIUM No results found for: VALPROATE No components found for:  CBMZ  Current Medications: Current Outpatient Medications  Medication Sig Dispense Refill  . albuterol (PROVENTIL HFA;VENTOLIN HFA) 108 (90 BASE) MCG/ACT inhaler Inhale 2 puffs into the lungs every 6 (six) hours as needed for wheezing or shortness of breath.    Marland Kitchen amLODipine (NORVASC) 5 MG tablet Take 5 mg by mouth daily.    Marland Kitchen aspirin EC 81 MG tablet Take 81 mg by mouth daily.    Marland Kitchen atorvastatin (LIPITOR) 10 MG tablet Take 1 tablet (10 mg total) by mouth at bedtime. 90 tablet 3  . busPIRone (BUSPAR) 10 MG tablet Take 1 tablet (10 mg total) by mouth 3 (three) times daily as needed (anxiety). 84 tablet 2  . escitalopram (LEXAPRO) 10 MG tablet Take 1 tablet (10 mg total) by mouth daily. 28 tablet 2  . FARXIGA 5 MG TABS tablet Take 1 tablet by mouth daily.    . furosemide (LASIX) 40 MG tablet Take by mouth. Taking 1.5 tabs QD    . glimepiride (AMARYL) 2 MG tablet Take 1 tablet by mouth 2 (two) times daily.    Marland Kitchen HYDROcodone-acetaminophen (NORCO/VICODIN) 5-325 MG per tablet Take 1 tablet by mouth every 4 (four) hours as needed for moderate pain. (Patient taking differently: Take 1 tablet by mouth 2 (two) times daily. ) 30 tablet 0  . ipratropium-albuterol (DUONEB) 0.5-2.5 (3) MG/3ML SOLN Take 3 mLs by nebulization 4 (four) times daily as needed. Shortness of breath    . JANUVIA 50 MG tablet Take 1 tablet by mouth daily.    Marland Kitchen lamoTRIgine (LAMICTAL) 100 MG tablet Take 1 tablet (100 mg  total) by mouth at bedtime. 28 tablet 2  . memantine (NAMENDA XR) 14 MG CP24 24 hr capsule Take 14 mg by mouth daily.    . metoprolol tartrate (LOPRESSOR) 25 MG tablet TAKE (1/2) TABLET BY MOUTH TWICE DAILY. 45 tablet 3  . mirtazapine (REMERON) 15 MG tablet Take 1 tablet (15 mg total) by mouth at bedtime. 28 tablet 2  . omeprazole (PRILOSEC) 40 MG capsule Take 20 mg by mouth daily.     . QUEtiapine (SEROQUEL) 50 MG tablet Take 1 tablet (50 mg total) by mouth at bedtime. 28 tablet 2  . solifenacin (VESICARE) 10 MG tablet Take 10 mg by mouth daily.      Marland Kitchen VITAMIN D, CHOLECALCIFEROL, PO Take 1 tablet by mouth daily.     No current facility-administered medications for this visit.      Musculoskeletal: Strength & Muscle Tone: N/A Gait & Station: N/A Patient leans: N/A  Psychiatric Specialty Exam: Review of Systems  Psychiatric/Behavioral: Positive for depression and memory loss. Negative for hallucinations, substance abuse and suicidal ideas. The patient is nervous/anxious. The patient does not have insomnia.   All other systems reviewed and are negative.   There were no vitals  taken for this visit.There is no height or weight on file to calculate BMI.  General Appearance: NA  Eye Contact:  NA  Speech:  Clear and Coherent  Volume:  Normal  Mood:  Depressed  Affect:  NA  Thought Process:  Coherent  Orientation:  Full (Time, Place, and Person)  Thought Content: Logical   Suicidal Thoughts:  Yes.  without intent/plan  Homicidal Thoughts:  No  Memory:  Immediate;   Good  Judgement:  Fair  Insight:  Present  Psychomotor Activity:  Normal  Concentration:  Concentration: Fair and Attention Span: Fair  Recall:  [employer]  Fund of Knowledge: Good  Language: Good  Akathisia:  No  Handed:  Right  AIMS (if indicated): not done  Assets:  Communication Skills Desire for Improvement  ADL's:  Intact  Cognition: WNL  Sleep:  Fair   Screenings: PHQ2-9     [facility]  PHQ-2 Total Score  0       Assessment and Plan:  Boykin ReaperBarbara N Casino is a 78 y.o. year old female with a history of bipolar II disorder, major neurocognitive disorder, hypertension,CAD,COPD, TIA, type II DM , who presents for follow up appointment for bipolar disorder.   # Bipolar II disorder There has been steady improvement in the AH of music since up titration of quetiapine. Although she did have periods of depression, it has been relatively stable.  Will continue Lexapro to target depression.  Will continue mirtazapine as adjunctive treatment for depression.  Continue lamotrigine for mood dysregulation.  Discussed risk of Stvens Johnson syndrome.  Will continue quetiapine for mood dysregulation.  Discussed risk of sedation and metabolic side effect.  Will continue BuSpar for anxiety.  Although it is preferable to avoid polypharmacy, she has strong preference to stay on her current medication. Will continue to adjust as needed.   # major neurocognitive disorder She does have a cognitive deficits as characterized by MOCA. She is on memantine, prescribed by PCP. Will continue to monitor.   Plan I have reviewed and updated plans as below 1. Continue lexapro 10 mg daily 2. Continue mirtazapine 15 mg at night 3.Continuelamotrigine100mg  daily 4.Continuequetiapine 50mg  night 5. Continue Buspar 10 mg three times a day as needed for anxiety  6.Next appointment: 9/10 at 2 PM for 20 mins,  7.(She is on memantine 14 mg daily)  The duration of this appointment visit was 25minutes of non face-to-face time with the patient. Greater than 50% of this time was spent in counseling, explanation of  diagnosis, planning of further management, and coordination of care.  I have reviewed suicide assessment in detail. No change in the following assessment.   The patient demonstrates the following risk factors for suicide: Chronic risk factors  for suicide include: psychiatric disorder of bipolar disorder. Acute risk factorsfor suicide include: unemployment and social withdrawal/isolation. Protective factorsfor this patient include: positive social support, coping skills. Considering these factors, the overall suicide risk at this point appears to be low. Patient isappropriate for outpatient follow up.  [name] Ilaria Much, MD 02/01/2019, 2:24 PM

## 2019-02-01 ENCOUNTER — Other Ambulatory Visit: Payer: Self-pay

## 2019-02-01 ENCOUNTER — Encounter (HOSPITAL_COMMUNITY): Payer: Self-pay | Admitting: Psychiatry

## 2019-02-01 ENCOUNTER — Ambulatory Visit (INDEPENDENT_AMBULATORY_CARE_PROVIDER_SITE_OTHER): Payer: Medicare Other | Admitting: Psychiatry

## 2019-02-01 DIAGNOSIS — F0150 Vascular dementia without behavioral disturbance: Secondary | ICD-10-CM | POA: Diagnosis not present

## 2019-02-01 DIAGNOSIS — F3181 Bipolar II disorder: Secondary | ICD-10-CM

## 2019-02-01 MED ORDER — QUETIAPINE FUMARATE 50 MG PO TABS: 50.0000 mg | ORAL_TABLET | Freq: Every day | ORAL | 2 refills | Status: DC

## 2019-02-01 MED ORDER — LAMOTRIGINE 100 MG PO TABS: 100.0000 mg | ORAL_TABLET | Freq: Every day | ORAL | 2 refills | Status: DC

## 2019-02-01 MED ORDER — ESCITALOPRAM OXALATE 10 MG PO TABS: 10.0000 mg | ORAL_TABLET | Freq: Every day | ORAL | 2 refills | Status: DC

## 2019-02-01 MED ORDER — MIRTAZAPINE 15 MG PO TABS: 15.0000 mg | ORAL_TABLET | Freq: Every day | ORAL | 2 refills | Status: DC

## 2019-02-01 MED ORDER — BUSPIRONE HCL 10 MG PO TABS: 10.0000 mg | ORAL_TABLET | Freq: Three times a day (TID) | ORAL | 2 refills | Status: DC | PRN

## 2019-02-01 NOTE — Patient Instructions (Signed)
1. Continue lexapro 10 mg daily 2. Continue mirtazapine 15 mg at night 3.Continuelamotrigine100mg  daily 4.Continuequetiapine 50mg  night 5. Continue Buspar 10 mg three times a day as needed for anxiety  6.Next appointment: 9/10 at 2 PM

## 2019-02-27 NOTE — Progress Notes (Signed)
Virtual Visit via Telephone Note  I connected with Brenda Mcdonald on 02/28/19 at  8:20 AM EDT by telephone and verified that I am speaking with the correct person using two identifiers.   I discussed the limitations, risks, security and privacy concerns of performing an evaluation and management service by telephone and the availability of in person appointments. I also discussed with the patient that there may be a patient responsible charge related to this service. The patient expressed understanding and agreed to proceed.      I discussed the assessment and treatment plan with the patient. The patient was provided an opportunity to ask questions and all were answered. The patient agreed with the plan and demonstrated an understanding of the instructions.   The patient was advised to call back or seek an in-person evaluation if the symptoms worsen or if the condition fails to improve as anticipated.  I provided 25 minutes of non-face-to-face time during this encounter.   Brenda Hotter, MD     [facility] MD/PA/NP OP Progress Note  02/28/2019 8:51 AM Brenda Mcdonald  MRN:  [id number]  Chief Complaint:  Chief Complaint    Follow-up; Other     HPI:  This is a follow-up appointment for bipolar 2 disorder.  She states that she has any not feeling good.  She cannot recall any trigger, stating that she does need "nothing to make me depressed."  She states that she has some blurry vision and it has been difficult for the patient to read books. She also cannot take a walk. She does not have regular schedule. She agrees that her activity has bene limited since pandemic.  She would like to adjust her medication, stating that she needs something to take during the day to feel better.  She has occasional insomnia.  She feels fatigue.  She has fair concentration.  She has decreased appetite to increased appetite.  Although she has fleeting passive SI, she denies any intent or plans. She denies AH, VH.  She  denies decreased need for sleep or euphoria.   Discussed with [name], patient niece on the phone.  [name] thinks that the patient is depressed more lately. She denies any safety concern.   Visit Diagnosis:    ICD-10-CM   1. Bipolar II disorder (HCC)  F31.81     Past Psychiatric History: Please see initial evaluation for full details. I have reviewed the history. No updates at this time.     Past Medical History:  Past Medical History:  Diagnosis Date  . Anxiety   . Arthritis   . Bipolar disorder (HCC)   . COPD (chronic obstructive pulmonary disease) (HCC)   . Coronary atherosclerosis of native coronary artery    Nonobstructive  . Depression   . Diabetes mellitus, type 2 (HCC)   . Dysphagia   . Essential hypertension, benign   . GERD (gastroesophageal reflux disease)   . Pneumonia   . Pulmonary nodule July 2009   Nonspecific 5 mm right middle lobe  . TIA (transient ischemic attack)     Past Surgical History:  Procedure Laterality Date  . ABDOMINAL HYSTERECTOMY    . APPENDECTOMY    . CARDIAC CATHETERIZATION  11/2010  . CATARACT EXTRACTION, BILATERAL Bilateral   . CHOLECYSTECTOMY    . DILATION AND CURETTAGE OF UTERUS    . INCISION AND DRAINAGE Left 03/12/2014   Procedure: LEFT INCISION/DRAINAGE DEEP ABSCESS/BURSA/HEMATOMA THIGH/KNEE REGION;  Surgeon: [name], MD;  Location: [facility] OR;  Service:  Orthopedics;  Laterality: Left;  . KNEE ARTHROSCOPY Left   . ORIF DISTAL FEMUR FRACTURE Left 01/24/2014  . ORIF FEMUR FRACTURE Left 01/24/2014   Procedure: OPEN REDUCTION INTERNAL FIXATION (ORIF) DISTAL FEMUR FRACTURE;  Surgeon: Sheral Apleyimothy D Murphy, MD;  Location: [facility] OR;  Service: Orthopedics;  Laterality: Left;  . TIBIA FRACTURE SURGERY Left ~ 1959  . TOTAL KNEE ARTHROPLASTY Left 2000  . VESICOVAGINAL FISTULA CLOSURE W/ TAH      Family Psychiatric History: Please see initial evaluation for full details. I have reviewed the history. No updates at this time.     Family History:   Family History  Problem Relation Age of Onset  . Coronary artery disease Unknown     Social History:  Social History   Socioeconomic History  . Marital status: Married    Spouse name: Not on file  . Number of children: Not on file  . Years of education: Not on file  . Highest education level: Not on file  Occupational History  . Not on file  Social Needs  . Financial resource strain: Not on file  . Food insecurity    Worry: Not on file    Inability: Not on file  . Transportation needs    Medical: Not on file    Non-medical: Not on file  Tobacco Use  . Smoking status: Former Smoker    Packs/day: 0.50    Years: 58.00    Pack years: 29.00    Types: Cigarettes    Quit date: 07/26/2010    Years since quitting: 8.6  . Smokeless tobacco: Never Used  Substance and Sexual Activity  . Alcohol use: No    Alcohol/week: 0.0 standard drinks  . Drug use: No  . Sexual activity: Never  Lifestyle  . Physical activity    Days per week: Not on file    Minutes per session: Not on file  . Stress: Not on file  Relationships  . Social Musicianconnections    Talks on phone: Not on file    Gets together: Not on file    Attends religious service: Not on file    Active member of club or organization: Not on file    Attends meetings of clubs or organizations: Not on file    Relationship status: Not on file  Other Topics Concern  . Not on file  Social History Narrative  . Not on file    Allergies:  Allergies  Allergen Reactions  . Azithromycin     Per MAR  . Penicillins     Tolerated Rocephin (July 2015)  . Tetracycline     Per MAR    Metabolic Disorder Labs: No results found for: HGBA1C, MPG No results found for: PROLACTIN Lab Results  Component Value Date   CHOL  09/06/2008    164        ATP III CLASSIFICATION:  <200     mg/dL   Desirable  161-096200-239  mg/dL   Borderline High  >=045>=240    mg/dL   High          TRIG 86 09/06/2008   HDL 49 09/06/2008   CHOLHDL 3.3 09/06/2008    VLDL 17 09/06/2008   LDLCALC  09/06/2008    98        Total Cholesterol/HDL:CHD Risk Coronary Heart Disease Risk Table                     Men   Women  1/2 Average  Risk   3.4   3.3  Average Risk       5.0   4.4  2 X Average Risk   9.6   7.1  3 X Average Risk  23.4   11.0        Use the calculated Patient Ratio above and the CHD Risk Table to determine the patient's CHD Risk.        ATP III CLASSIFICATION (LDL):  <100     mg/dL   Optimal  093-818  mg/dL   Near or Above                    Optimal  130-159  mg/dL   Borderline  299-371  mg/dL   High  >696     mg/dL   Very High   No results found for: TSH  Therapeutic Level Labs: No results found for: LITHIUM No results found for: VALPROATE No components found for:  CBMZ  Current Medications: Current Outpatient Medications  Medication Sig Dispense Refill  . albuterol (PROVENTIL HFA;VENTOLIN HFA) 108 (90 BASE) MCG/ACT inhaler Inhale 2 puffs into the lungs every 6 (six) hours as needed for wheezing or shortness of breath.    Marland Kitchen amLODipine (NORVASC) 5 MG tablet Take 5 mg by mouth daily.    Marland Kitchen aspirin EC 81 MG tablet Take 81 mg by mouth daily.    Marland Kitchen atorvastatin (LIPITOR) 10 MG tablet Take 1 tablet (10 mg total) by mouth at bedtime. 90 tablet 3  . busPIRone (BUSPAR) 10 MG tablet Take 1 tablet (10 mg total) by mouth 3 (three) times daily as needed (anxiety). 84 tablet 2  . escitalopram (LEXAPRO) 10 MG tablet Take 1 tablet (10 mg total) by mouth daily. 28 tablet 2  . FARXIGA 5 MG TABS tablet Take 1 tablet by mouth daily.    . furosemide (LASIX) 40 MG tablet Take by mouth. Taking 1.5 tabs QD    . glimepiride (AMARYL) 2 MG tablet Take 1 tablet by mouth 2 (two) times daily.    Marland Kitchen HYDROcodone-acetaminophen (NORCO/VICODIN) 5-325 MG per tablet Take 1 tablet by mouth every 4 (four) hours as needed for moderate pain. (Patient taking differently: Take 1 tablet by mouth 2 (two) times daily. ) 30 tablet 0  . ipratropium-albuterol (DUONEB) 0.5-2.5  (3) MG/3ML SOLN Take 3 mLs by nebulization 4 (four) times daily as needed. Shortness of breath    . JANUVIA 50 MG tablet Take 1 tablet by mouth daily.    Marland Kitchen lamoTRIgine (LAMICTAL) 100 MG tablet Take 1 tablet (100 mg total) by mouth at bedtime. 28 tablet 2  . memantine (NAMENDA XR) 14 MG CP24 24 hr capsule Take 14 mg by mouth daily.    . metoprolol tartrate (LOPRESSOR) 25 MG tablet TAKE (1/2) TABLET BY MOUTH TWICE DAILY. 45 tablet 3  . mirtazapine (REMERON) 30 MG tablet Take 1 tablet (30 mg total) by mouth at bedtime. 28 tablet 1  . omeprazole (PRILOSEC) 40 MG capsule Take 20 mg by mouth daily.     . QUEtiapine (SEROQUEL) 50 MG tablet Take 1 tablet (50 mg total) by mouth at bedtime. 28 tablet 2  . solifenacin (VESICARE) 10 MG tablet Take 10 mg by mouth daily.      Marland Kitchen VITAMIN D, CHOLECALCIFEROL, PO Take 1 tablet by mouth daily.     No current facility-administered medications for this visit.      Musculoskeletal: Strength & Muscle Tone: N/A Gait & Station: N/A Patient leans: N/A  Psychiatric Specialty Exam: Review of Systems  Psychiatric/Behavioral: Positive for depression. Negative for hallucinations, memory loss, substance abuse and suicidal ideas. The patient is nervous/anxious and has insomnia.   All other systems reviewed and are negative.   There were no vitals taken for this visit.There is no height or weight on file to calculate BMI.  General Appearance: NA  Eye Contact:  NA  Speech:  Clear and Coherent  Volume:  Normal  Mood:  Depressed  Affect:  NA  Thought Process:  Coherent  Orientation:  Full (Time, Place, and Person)  Thought Content: Logical   Suicidal Thoughts:  No  Homicidal Thoughts:  No  Memory:  Immediate;   Good  Judgement:  Fair  Insight:  Fair  Psychomotor Activity:  Normal  Concentration:  Concentration: Good and Attention Span: Good  Recall:  Good  Fund of Knowledge: Good  Language: Good  Akathisia:  No  Handed:  Right  AIMS (if indicated): not done   Assets:  Communication Skills Desire for Improvement  ADL's:  Intact  Cognition: WNL  Sleep:  Poor   Screenings: PHQ2-9     [facility] Visit from 04/09/2014 in [facility]  PHQ-2 Total Score  0       Assessment and Plan:  Brenda Mcdonald is a 78 y.o. year old female with a history of bipolar II disorder, major neurocognitive disorder hypertension,CAD,COPD, TIA, type II DM, , who presents for follow up appointment for bipolar II disorder.    # Bipolar II disorder She reports worsening in depression since her last visit.  Psychosocial stressors includes pandemic, and she is demoralized due to her physical condition.  Will uptitrate mirtazapine as adjunctive treatment for depression.  Discussed potential metabolic side effect and drowsiness.  Will continue Lexapro to target depression.  Will continue quetiapine for mood dysregulation.  Discussed risk of sedation and metabolic side effect.  Will continue lamotrigine for mood dysregulation.  Discussed risk of Stevens-Johnson syndrome.  Will continue BuSpar for anxiety.  Discussed behavioral activation. She will greatly benefit from supportive therapy; she is encouraged to continue to see [name] for therapy.   # major neurocognitive disorder She does have a cognitive deficits as characterized by Moca.  She is on memantine, prescribed by PCP.  Will continue to monitor.   Plan I have reviewed and updated plans as below 1. Continue lexapro 10 mg daily 2. Increase mirtazapine 30 mg at night 3.Continuelamotrigine100mg  daily 4.Continuequetiapine 50mg  night 5. ContinueBuspar10 mg three times a day as needed for anxiety  6.Next appointment:9/10 at 11:30 PM, phone for 30 mins  7.(She is on memantine 14 mg daily)  I have reviewed suicide assessment in detail. No change in the following assessment.   The patient demonstrates the following risk factors for suicide: Chronic risk factors for  suicide include: psychiatric disorder of bipolar disorder. Acute risk factorsfor suicide include: unemployment and social withdrawal/isolation. Protective factorsfor this patient include: positive social support, coping skills. Considering these factors, the overall suicide risk at this point appears to be low. Patient isappropriate for outpatient follow up.  The duration of this appointment visit was 25 minutes of non face-to-face time with the patient.  Greater than 50% of this time was spent in counseling, explanation of  diagnosis, planning of further management, and coordination of care.  Norman Clay, MD 02/28/2019, 8:51 AM

## 2019-02-28 ENCOUNTER — Ambulatory Visit (INDEPENDENT_AMBULATORY_CARE_PROVIDER_SITE_OTHER): Payer: Medicare Other | Admitting: Psychiatry

## 2019-02-28 ENCOUNTER — Encounter (HOSPITAL_COMMUNITY): Payer: Self-pay | Admitting: Psychiatry

## 2019-02-28 ENCOUNTER — Other Ambulatory Visit: Payer: Self-pay

## 2019-02-28 DIAGNOSIS — F3181 Bipolar II disorder: Secondary | ICD-10-CM | POA: Diagnosis not present

## 2019-02-28 DIAGNOSIS — F0150 Vascular dementia without behavioral disturbance: Secondary | ICD-10-CM | POA: Diagnosis not present

## 2019-02-28 MED ORDER — MIRTAZAPINE 30 MG PO TABS: 30.0000 mg | ORAL_TABLET | Freq: Every day | ORAL | 1 refills | Status: DC

## 2019-02-28 NOTE — Patient Instructions (Addendum)
1. Continue lexapro 10 mg daily 2. Increase mirtazapine 30 mg at night 3.Continuelamotrigine100mg  daily 4.Continuequetiapine 50mg  night 5. ContinueBuspar10 mg three times a day as needed for anxiety  6.Next appointment:9/10 at 11:30 PM

## 2019-03-15 DIAGNOSIS — Z6835 Body mass index (BMI) 35.0-35.9, adult: Secondary | ICD-10-CM | POA: Diagnosis not present

## 2019-03-15 DIAGNOSIS — J449 Chronic obstructive pulmonary disease, unspecified: Secondary | ICD-10-CM | POA: Diagnosis not present

## 2019-03-15 DIAGNOSIS — F319 Bipolar disorder, unspecified: Secondary | ICD-10-CM | POA: Diagnosis not present

## 2019-03-15 DIAGNOSIS — Z299 Encounter for prophylactic measures, unspecified: Secondary | ICD-10-CM | POA: Diagnosis not present

## 2019-03-15 DIAGNOSIS — E1142 Type 2 diabetes mellitus with diabetic polyneuropathy: Secondary | ICD-10-CM | POA: Diagnosis not present

## 2019-03-15 DIAGNOSIS — I10 Essential (primary) hypertension: Secondary | ICD-10-CM | POA: Diagnosis not present

## 2019-03-15 DIAGNOSIS — M170 Bilateral primary osteoarthritis of knee: Secondary | ICD-10-CM | POA: Diagnosis not present

## 2019-03-22 NOTE — Progress Notes (Deleted)
[facility] MD/PA/NP OP Progress Note  03/22/2019 3:43 PM Brenda Mcdonald  MRN:  [id number]  Chief Complaint:  HPI: *** Visit Diagnosis: No diagnosis found.  Past Psychiatric History: Please see initial evaluation for full details. I have reviewed the history. No updates at this time.     Past Medical History:  Past Medical History:  Diagnosis Date  . Anxiety   . Arthritis   . Bipolar disorder (HCC)   . COPD (chronic obstructive pulmonary disease) (HCC)   . Coronary atherosclerosis of native coronary artery    Nonobstructive  . Depression   . Diabetes mellitus, type 2 (HCC)   . Dysphagia   . Essential hypertension, benign   . GERD (gastroesophageal reflux disease)   . Pneumonia   . Pulmonary nodule July 2009   Nonspecific 5 mm right middle lobe  . TIA (transient ischemic attack)     Past Surgical History:  Procedure Laterality Date  . ABDOMINAL HYSTERECTOMY    . APPENDECTOMY    . CARDIAC CATHETERIZATION  11/2010  . CATARACT EXTRACTION, BILATERAL Bilateral   . CHOLECYSTECTOMY    . DILATION AND CURETTAGE OF UTERUS    . INCISION AND DRAINAGE Left 03/12/2014   Procedure: LEFT INCISION/DRAINAGE DEEP ABSCESS/BURSA/HEMATOMA THIGH/KNEE REGION;  Surgeon: [name], MD;  Location: [facility] OR;  Service: Orthopedics;  Laterality: Left;  . KNEE ARTHROSCOPY Left   . ORIF DISTAL FEMUR FRACTURE Left 01/24/2014  . ORIF FEMUR FRACTURE Left 01/24/2014   Procedure: OPEN REDUCTION INTERNAL FIXATION (ORIF) DISTAL FEMUR FRACTURE;  Surgeon: [name], MD;  Location: [facility] OR;  Service: Orthopedics;  Laterality: Left;  . TIBIA FRACTURE SURGERY Left ~ 1959  . TOTAL KNEE ARTHROPLASTY Left 2000  . VESICOVAGINAL FISTULA CLOSURE W/ TAH      Family Psychiatric History: Please see initial evaluation for full details. I have reviewed the history. No updates at this time.     Family History:  Family History  Problem Relation Age of Onset  . Coronary artery disease Unknown     Social History:  Social  History   Socioeconomic History  . Marital status: Married    Spouse name: Not on file  . Number of children: Not on file  . Years of education: Not on file  . Highest education level: Not on file  Occupational History  . Not on file  Social Needs  . Financial resource strain: Not on file  . Food insecurity    Worry: Not on file    Inability: Not on file  . Transportation needs    Medical: Not on file    Non-medical: Not on file  Tobacco Use  . Smoking status: Former Smoker    Packs/day: 0.50    Years: 58.00    Pack years: 29.00    Types: Cigarettes    Quit date: 07/26/2010    Years since quitting: 8.6  . Smokeless tobacco: Never Used  Substance and Sexual Activity  . Alcohol use: No    Alcohol/week: 0.0 standard drinks  . Drug use: No  . Sexual activity: Never  Lifestyle  . Physical activity    Days per week: Not on file    Minutes per session: Not on file  . Stress: Not on file  Relationships  . Social Musician on phone: Not on file    Gets together: Not on file    Attends religious service: Not on file    Active member of club or organization:  Not on file    Attends meetings of clubs or organizations: Not on file    Relationship status: Not on file  Other Topics Concern  . Not on file  Social History Narrative  . Not on file    Allergies:  Allergies  Allergen Reactions  . Azithromycin     Per MAR  . Penicillins     Tolerated Rocephin (July 2015)  . Tetracycline     Per MAR    Metabolic Disorder Labs: No results found for: HGBA1C, MPG No results found for: PROLACTIN Lab Results  Component Value Date   CHOL  09/06/2008    164        ATP III CLASSIFICATION:  <200     mg/dL   Desirable  200-239  mg/dL   Borderline High  >=240    mg/dL   High          TRIG 86 09/06/2008   HDL 49 09/06/2008   CHOLHDL 3.3 09/06/2008   VLDL 17 09/06/2008   LDLCALC  09/06/2008    98        Total Cholesterol/HDL:CHD Risk Coronary Heart Disease Risk  Table                     Men   Women  1/2 Average Risk   3.4   3.3  Average Risk       5.0   4.4  2 X Average Risk   9.6   7.1  3 X Average Risk  23.4   11.0        Use the calculated Patient Ratio above and the CHD Risk Table to determine the patient's CHD Risk.        ATP III CLASSIFICATION (LDL):  <100     mg/dL   Optimal  100-129  mg/dL   Near or Above                    Optimal  130-159  mg/dL   Borderline  160-189  mg/dL   High  >190     mg/dL   Very High   No results found for: TSH  Therapeutic Level Labs: No results found for: LITHIUM No results found for: VALPROATE No components found for:  CBMZ  Current Medications: Current Outpatient Medications  Medication Sig Dispense Refill  . albuterol (PROVENTIL HFA;VENTOLIN HFA) 108 (90 BASE) MCG/ACT inhaler Inhale 2 puffs into the lungs every 6 (six) hours as needed for wheezing or shortness of breath.    Marland Kitchen amLODipine (NORVASC) 5 MG tablet Take 5 mg by mouth daily.    Marland Kitchen aspirin EC 81 MG tablet Take 81 mg by mouth daily.    Marland Kitchen atorvastatin (LIPITOR) 10 MG tablet Take 1 tablet (10 mg total) by mouth at bedtime. 90 tablet 3  . busPIRone (BUSPAR) 10 MG tablet Take 1 tablet (10 mg total) by mouth 3 (three) times daily as needed (anxiety). 84 tablet 2  . escitalopram (LEXAPRO) 10 MG tablet Take 1 tablet (10 mg total) by mouth daily. 28 tablet 2  . FARXIGA 5 MG TABS tablet Take 1 tablet by mouth daily.    . furosemide (LASIX) 40 MG tablet Take by mouth. Taking 1.5 tabs QD    . glimepiride (AMARYL) 2 MG tablet Take 1 tablet by mouth 2 (two) times daily.    Marland Kitchen HYDROcodone-acetaminophen (NORCO/VICODIN) 5-325 MG per tablet Take 1 tablet by mouth every 4 (four) hours as needed for moderate  pain. (Patient taking differently: Take 1 tablet by mouth 2 (two) times daily. ) 30 tablet 0  . ipratropium-albuterol (DUONEB) 0.5-2.5 (3) MG/3ML SOLN Take 3 mLs by nebulization 4 (four) times daily as needed. Shortness of breath    . JANUVIA 50 MG  tablet Take 1 tablet by mouth daily.    Marland Kitchen. lamoTRIgine (LAMICTAL) 100 MG tablet Take 1 tablet (100 mg total) by mouth at bedtime. 28 tablet 2  . memantine (NAMENDA XR) 14 MG CP24 24 hr capsule Take 14 mg by mouth daily.    . metoprolol tartrate (LOPRESSOR) 25 MG tablet TAKE (1/2) TABLET BY MOUTH TWICE DAILY. 45 tablet 3  . mirtazapine (REMERON) 30 MG tablet Take 1 tablet (30 mg total) by mouth at bedtime. 28 tablet 1  . omeprazole (PRILOSEC) 40 MG capsule Take 20 mg by mouth daily.     . QUEtiapine (SEROQUEL) 50 MG tablet Take 1 tablet (50 mg total) by mouth at bedtime. 28 tablet 2  . solifenacin (VESICARE) 10 MG tablet Take 10 mg by mouth daily.      Marland Kitchen. VITAMIN D, CHOLECALCIFEROL, PO Take 1 tablet by mouth daily.     No current facility-administered medications for this visit.      Musculoskeletal: Strength & Muscle Tone: N/A Gait & Station: N/ Patient leans: N/A  Psychiatric Specialty Exam: ROS  There were no vitals taken for this visit.There is no height or weight on file to calculate BMI.  General Appearance: {Appearance:22683}  Eye Contact:  {B[facility] EYE CONTACT:22684}  Speech:  Clear and Coherent  Volume:  Normal  Mood:  {BHH MOOD:22306}  Affect:  {Affect (PAA):22687}  Thought Process:  Coherent  Orientation:  Full (Time, Place, and Person)  Thought Content: Logical   Suicidal Thoughts:  {ST/HT (PAA):22692}  Homicidal Thoughts:  {ST/HT (PAA):22692}  Memory:  Immediate;   Good  Judgement:  {Judgement (PAA):22694}  Insight:  {Insight (PAA):22695}  Psychomotor Activity:  Normal  Concentration:  Concentration: Good and Attention Span: Good  Recall:  Good  Fund of Knowledge: Good  Language: Good  Akathisia:  No  Handed:  Right  AIMS (if indicated): not done  Assets:  Communication Skills Social Support  ADL's:  Intact  Cognition: WNL  Sleep:  {B[facility] GOOD/FAIR/POOR:22877}   Screenings: PHQ2-9     [facility]  PHQ-2 Total Score  0       Assessment and Plan:  Brenda Mcdonald is a 78 y.o. year old female with a history of bipolar II disorder, major neurocognitive disorder, hypertension,CAD,COPD, TIA, type II DM, , who presents for follow up appointment for No diagnosis found.  # Bipolar II disorder   She reports worsening in depression since her last visit.  Psychosocial stressors includes pandemic, and she is demoralized due to her physical condition.  Will uptitrate mirtazapine as adjunctive treatment for depression.  Discussed potential metabolic side effect and drowsiness.  Will continue Lexapro to target depression.  Will continue quetiapine for mood dysregulation.  Discussed risk of sedation and metabolic side effect.  Will continue lamotrigine for mood dysregulation.  Discussed risk of Stevens-Johnson syndrome.  Will continue BuSpar for anxiety.  Discussed behavioral activation. She will greatly benefit from supportive therapy; she is encouraged to continue to see [name] for therapy.   #major neurocognitive disorder She does have a cognitive deficits as characterized by Moca.  She is on memantine, prescribed by PCP.  Will continue to monitor.  Plan  1. Continue lexapro 10 mg daily 2. Increase mirtazapine 30 mg at night 3.Continuelamotrigine100mg  daily 4.Continuequetiapine 50mg  night 5. ContinueBuspar10 mg three times a day as needed for anxiety  6.Next appointment:9/10 at 11:30 PM, phone for 30 mins  7.(She is on memantine 14 mg daily)   The patient demonstrates the following risk factors for suicide: Chronic risk factors for suicide include: psychiatric disorder of bipolar disorder. Acute risk factorsfor suicide include: unemployment and social withdrawal/isolation. Protective factorsfor this patient include: positive social support, coping skills. Considering these factors, the overall suicide risk at this point appears to be low. Patient  isappropriate for outpatient follow up.    Neysa Hotter, MD 03/22/2019, 3:43 PM

## 2019-04-05 ENCOUNTER — Ambulatory Visit (HOSPITAL_COMMUNITY): Payer: Medicare Other | Admitting: Psychiatry

## 2019-05-07 DIAGNOSIS — J449 Chronic obstructive pulmonary disease, unspecified: Secondary | ICD-10-CM | POA: Diagnosis not present

## 2019-05-07 DIAGNOSIS — F319 Bipolar disorder, unspecified: Secondary | ICD-10-CM | POA: Diagnosis not present

## 2019-05-07 DIAGNOSIS — Z6835 Body mass index (BMI) 35.0-35.9, adult: Secondary | ICD-10-CM | POA: Diagnosis not present

## 2019-05-07 DIAGNOSIS — Z299 Encounter for prophylactic measures, unspecified: Secondary | ICD-10-CM | POA: Diagnosis not present

## 2019-05-07 DIAGNOSIS — E1165 Type 2 diabetes mellitus with hyperglycemia: Secondary | ICD-10-CM | POA: Diagnosis not present

## 2019-05-07 DIAGNOSIS — M1990 Unspecified osteoarthritis, unspecified site: Secondary | ICD-10-CM | POA: Diagnosis not present

## 2019-05-07 DIAGNOSIS — Z79899 Other long term (current) drug therapy: Secondary | ICD-10-CM | POA: Diagnosis not present

## 2019-05-10 ENCOUNTER — Telehealth (HOSPITAL_COMMUNITY): Payer: Self-pay | Admitting: *Deleted

## 2019-05-10 ENCOUNTER — Other Ambulatory Visit (HOSPITAL_COMMUNITY): Payer: Self-pay | Admitting: Psychiatry

## 2019-05-10 ENCOUNTER — Other Ambulatory Visit: Payer: Self-pay | Admitting: Cardiology

## 2019-05-10 MED ORDER — LAMOTRIGINE 100 MG PO TABS: 100.0000 mg | ORAL_TABLET | Freq: Every day | ORAL | 1 refills | Status: DC

## 2019-05-10 MED ORDER — MIRTAZAPINE 30 MG PO TABS: 30.0000 mg | ORAL_TABLET | Freq: Every day | ORAL | 1 refills | Status: DC

## 2019-05-10 MED ORDER — BUSPIRONE HCL 10 MG PO TABS: 10.0000 mg | ORAL_TABLET | Freq: Three times a day (TID) | ORAL | 1 refills | Status: DC | PRN

## 2019-05-10 MED ORDER — QUETIAPINE FUMARATE 50 MG PO TABS: 50.0000 mg | ORAL_TABLET | Freq: Every day | ORAL | 1 refills | Status: DC

## 2019-05-10 NOTE — Telephone Encounter (Signed)
Ordered. Please make sure that she has an appointment around in December or sooner as needed.

## 2019-05-10 NOTE — Telephone Encounter (Signed)
PATIENT CALLED REQUESTED REFILL FOR LAMICTAL & REMERON. sPOKE WITH THE Rx  & THERE ARE 0 REFILLS ON BOTH MEDICATION

## 2019-05-10 NOTE — Telephone Encounter (Signed)
LVM ON M# INFORMING PER PROVIDER: Ordered. Please make sure that she has an appointment around in December or sooner as needed.

## 2019-05-21 ENCOUNTER — Other Ambulatory Visit: Payer: Self-pay

## 2019-05-21 ENCOUNTER — Ambulatory Visit (INDEPENDENT_AMBULATORY_CARE_PROVIDER_SITE_OTHER): Payer: Medicare Other | Admitting: Psychiatry

## 2019-05-21 ENCOUNTER — Encounter (HOSPITAL_COMMUNITY): Payer: Self-pay | Admitting: Psychiatry

## 2019-05-21 ENCOUNTER — Other Ambulatory Visit: Payer: Self-pay | Admitting: *Deleted

## 2019-05-21 DIAGNOSIS — F0390 Unspecified dementia without behavioral disturbance: Secondary | ICD-10-CM

## 2019-05-21 DIAGNOSIS — F3181 Bipolar II disorder: Secondary | ICD-10-CM

## 2019-05-21 DIAGNOSIS — F0150 Vascular dementia without behavioral disturbance: Secondary | ICD-10-CM | POA: Diagnosis not present

## 2019-05-21 MED ORDER — ATORVASTATIN CALCIUM 10 MG PO TABS: 10.0000 mg | ORAL_TABLET | Freq: Every day | ORAL | 3 refills | Status: DC

## 2019-05-21 MED ORDER — QUETIAPINE FUMARATE 50 MG PO TABS: 50.0000 mg | ORAL_TABLET | Freq: Every day | ORAL | 2 refills | Status: DC

## 2019-05-21 MED ORDER — LAMOTRIGINE 100 MG PO TABS: 100.0000 mg | ORAL_TABLET | Freq: Every day | ORAL | 2 refills | Status: DC

## 2019-05-21 MED ORDER — MIRTAZAPINE 30 MG PO TABS: 30.0000 mg | ORAL_TABLET | Freq: Every day | ORAL | 2 refills | Status: DC

## 2019-05-21 MED ORDER — BUSPIRONE HCL 10 MG PO TABS: 10.0000 mg | ORAL_TABLET | Freq: Three times a day (TID) | ORAL | 2 refills | Status: DC | PRN

## 2019-05-21 MED ORDER — ESCITALOPRAM OXALATE 10 MG PO TABS: 10.0000 mg | ORAL_TABLET | Freq: Every day | ORAL | 2 refills | Status: DC

## 2019-05-21 NOTE — Progress Notes (Signed)
Virtual Visit via Telephone Note  Brenda Mcdonald connected with Brenda Mcdonald on 05/21/19 at 10:00 AM EDT by telephone and verified that Brenda Mcdonald am speaking with the correct person using two identifiers.   Brenda Mcdonald discussed the limitations, risks, security and privacy concerns of performing an evaluation and management service by telephone and the availability of in person appointments. Brenda Mcdonald also discussed with the Brenda Mcdonald that there may be a Brenda Mcdonald responsible charge related to this service. The Brenda Mcdonald expressed understanding and agreed to proceed.     Brenda Mcdonald discussed the assessment and treatment plan with the Brenda Mcdonald. The Brenda Mcdonald was provided an opportunity to ask questions and all were answered. The Brenda Mcdonald agreed with the plan and demonstrated an understanding of the instructions.   The Brenda Mcdonald was advised to call back or seek an in-person evaluation if the symptoms worsen or if the condition fails to improve as anticipated.  Brenda Mcdonald provided 15 minutes of non-face-to-face time during this encounter.   Norman Clay, MD    [facility] MD/PA/NP OP Progress Note  05/21/2019 10:27 AM Brenda Mcdonald  MRN:  [id number]  Chief Complaint:  Chief Complaint    Depression; Follow-up     HPI:  This is a follow-up appointment for bipolar disorder and neurocognitive disorder.  Brenda Mcdonald states that Brenda Mcdonald feels like "Brenda Mcdonald'm going to dementia." Brenda Mcdonald has had memory issues; "forget everything." Brenda Mcdonald tends to forget what Brenda Mcdonald was trying to, and forgets names of people. Brenda Mcdonald remembers names of Brenda Mcdonald family members. Brenda Mcdonald stays with Brenda Mcdonald husband, and home health, Brenda Mcdonald daughter visits the Brenda Mcdonald regularly. Brenda Mcdonald takes medication regularly from pill pack. Brenda Mcdonald states that Brenda Mcdonald husband is not doing good and also does have memory issues since he suffered from stroke twice. Brenda Mcdonald hopes to see people in person and stressed about the pandemic. Brenda Mcdonald has occasional insomnia.  Brenda Mcdonald feels depressed at times.  Brenda Mcdonald feels fatigued.  Brenda Mcdonald has difficulty concentration.  Brenda Mcdonald has occasional  appetite loss.  Brenda Mcdonald denies SI.  Brenda Mcdonald feels anxious at times.  Brenda Mcdonald denies panic attacks.  Brenda Mcdonald feels "hyper" at times. Brenda Mcdonald denies decreased need for sleep or increased goal directed activity.    Visit Diagnosis:    ICD-10-CM   1. Bipolar II disorder ([place])  F31.81   2. Major neurocognitive disorder ([place])  F01.50     Past Psychiatric History: Please see initial evaluation for full details. Brenda Mcdonald have reviewed the history. No updates at this time.     Past Medical History:  Past Medical History:  Diagnosis Date  . Anxiety   . Arthritis   . Bipolar disorder ([place])   . COPD (chronic obstructive pulmonary disease) ([place])   . Coronary atherosclerosis of native coronary artery    Nonobstructive  . Depression   . Diabetes mellitus, type 2 ([place])   . Dysphagia   . Essential hypertension, benign   . GERD (gastroesophageal reflux disease)   . Pneumonia   . Pulmonary nodule July 2009   Nonspecific 5 mm right middle lobe  . TIA (transient ischemic attack)     Past Surgical History:  Procedure Laterality Date  . ABDOMINAL HYSTERECTOMY    . APPENDECTOMY    . CARDIAC CATHETERIZATION  11/2010  . CATARACT EXTRACTION, BILATERAL Bilateral   . CHOLECYSTECTOMY    . DILATION AND CURETTAGE OF UTERUS    . INCISION AND DRAINAGE Left 03/12/2014   Procedure: LEFT INCISION/DRAINAGE DEEP ABSCESS/BURSA/HEMATOMA THIGH/KNEE REGION;  Surgeon: Renette Butters, MD;  Location: [place];  Service: Orthopedics;  Laterality:  Left;  . KNEE ARTHROSCOPY Left   . ORIF DISTAL FEMUR FRACTURE Left 01/24/2014  . ORIF FEMUR FRACTURE Left 01/24/2014   Procedure: OPEN REDUCTION INTERNAL FIXATION (ORIF) DISTAL FEMUR FRACTURE;  Surgeon: Sheral Apleyimothy D Murphy, MD;  Location: [facility];  Service: Orthopedics;  Laterality: Left;  . TIBIA FRACTURE SURGERY Left ~ 1959  . TOTAL KNEE ARTHROPLASTY Left 2000  . VESICOVAGINAL FISTULA CLOSURE W/ TAH      Family Psychiatric History: Please see initial evaluation for full details. Brenda Mcdonald have reviewed the  history. No updates at this time.     Family History:  Family History  Problem Relation Age of Onset  . Coronary artery disease Unknown     Social History:  Social History   Socioeconomic History  . Marital status: Married    Spouse name: Not on file  . Number of children: Not on file  . Years of education: Not on file  . Highest education level: Not on file  Occupational History  . Not on file  Social Needs  . Financial resource strain: Not on file  . Food insecurity    Worry: Not on file    Inability: Not on file  . Transportation needs    Medical: Not on file    Non-medical: Not on file  Tobacco Use  . Smoking status: Former Smoker    Packs/day: 0.50    Years: 58.00    Pack years: 29.00    Types: Cigarettes    Quit date: 07/26/2010    Years since quitting: 8.8  . Smokeless tobacco: Never Used  Substance and Sexual Activity  . Alcohol use: No    Alcohol/week: 0.0 standard drinks  . Drug use: No  . Sexual activity: Never  Lifestyle  . Physical activity    Days per week: Not on file    Minutes per session: Not on file  . Stress: Not on file  Relationships  . Social Musicianconnections    Talks on phone: Not on file    Gets together: Not on file    Attends religious service: Not on file    Active member of club or organization: Not on file    Attends meetings of clubs or organizations: Not on file    Relationship status: Not on file  Other Topics Concern  . Not on file  Social History Narrative  . Not on file    Allergies:  Allergies  Allergen Reactions  . Azithromycin     Per MAR  . Penicillins     Tolerated Rocephin (July 2015)  . Tetracycline     Per MAR    Metabolic Disorder Labs: No results found for: HGBA1C, MPG No results found for: PROLACTIN Lab Results  Component Value Date   CHOL  09/06/2008    164        ATP III CLASSIFICATION:  <200     mg/dL   Desirable  478-295200-239  mg/dL   Borderline High  >=621>=240    mg/dL   High          TRIG 86  09/06/2008   HDL 49 09/06/2008   CHOLHDL 3.3 09/06/2008   VLDL 17 09/06/2008   LDLCALC  09/06/2008    98        Total Cholesterol/HDL:CHD Risk Coronary Heart Disease Risk Table                     Men   Women  1/2 Average Risk  3.4   3.3  Average Risk       5.0   4.4  2 X Average Risk   9.6   7.1  3 X Average Risk  23.4   11.0        Use the calculated Brenda Mcdonald Ratio above and the CHD Risk Table to determine the Brenda Mcdonald's CHD Risk.        ATP III CLASSIFICATION (LDL):  <100     mg/dL   Optimal  154-008  mg/dL   Near or Above                    Optimal  130-159  mg/dL   Borderline  676-195  mg/dL   High  >093     mg/dL   Very High   No results found for: TSH  Therapeutic Level Labs: No results found for: LITHIUM No results found for: VALPROATE No components found for:  CBMZ  Current Medications: Current Outpatient Medications  Medication Sig Dispense Refill  . albuterol (PROVENTIL HFA;VENTOLIN HFA) 108 (90 BASE) MCG/ACT inhaler Inhale 2 puffs into the lungs every 6 (six) hours as needed for wheezing or shortness of breath.    Marland Kitchen amLODipine (NORVASC) 5 MG tablet Take 5 mg by mouth daily.    Marland Kitchen aspirin EC 81 MG tablet Take 81 mg by mouth daily.    Marland Kitchen atorvastatin (LIPITOR) 10 MG tablet Take 1 tablet (10 mg total) by mouth at bedtime. 90 tablet 3  . [START ON 07/09/2019] busPIRone (BUSPAR) 10 MG tablet Take 1 tablet (10 mg total) by mouth 3 (three) times daily as needed (anxiety). 84 tablet 2  . [START ON 07/10/2019] escitalopram (LEXAPRO) 10 MG tablet Take 1 tablet (10 mg total) by mouth daily. 28 tablet 2  . FARXIGA 5 MG TABS tablet Take 1 tablet by mouth daily.    . furosemide (LASIX) 40 MG tablet Take by mouth. Taking 1.5 tabs QD    . glimepiride (AMARYL) 2 MG tablet Take 1 tablet by mouth 2 (two) times daily.    Marland Kitchen HYDROcodone-acetaminophen (NORCO/VICODIN) 5-325 MG per tablet Take 1 tablet by mouth every 4 (four) hours as needed for moderate pain. (Brenda Mcdonald taking  differently: Take 1 tablet by mouth 2 (two) times daily. ) 30 tablet 0  . ipratropium-albuterol (DUONEB) 0.5-2.5 (3) MG/3ML SOLN Take 3 mLs by nebulization 4 (four) times daily as needed. Shortness of breath    . JANUVIA 50 MG tablet Take 1 tablet by mouth daily.    Melene Muller ON 07/09/2019] lamoTRIgine (LAMICTAL) 100 MG tablet Take 1 tablet (100 mg total) by mouth at bedtime. 28 tablet 2  . memantine (NAMENDA XR) 14 MG CP24 24 hr capsule Take 14 mg by mouth daily.    . metoprolol tartrate (LOPRESSOR) 25 MG tablet TAKE (1/2) TABLET BY MOUTH TWICE DAILY. 90 tablet 0  . [START ON 07/10/2019] mirtazapine (REMERON) 30 MG tablet Take 1 tablet (30 mg total) by mouth at bedtime. 28 tablet 2  . omeprazole (PRILOSEC) 40 MG capsule Take 20 mg by mouth daily.     Melene Muller ON 07/09/2019] QUEtiapine (SEROQUEL) 50 MG tablet Take 1 tablet (50 mg total) by mouth at bedtime. 28 tablet 2  . solifenacin (VESICARE) 10 MG tablet Take 10 mg by mouth daily.      Marland Kitchen VITAMIN D, CHOLECALCIFEROL, PO Take 1 tablet by mouth daily.     No current facility-administered medications for this visit.      Musculoskeletal:  Strength & Muscle Tone: N/A Gait & Station: N/A Brenda Mcdonald leans: N/A  Psychiatric Specialty Exam: Review of Systems  Psychiatric/Behavioral: Positive for depression and memory loss. Negative for hallucinations, substance abuse and suicidal ideas. The Brenda Mcdonald is nervous/anxious and has insomnia.   All other systems reviewed and are negative.   There were no vitals taken for this visit.There is no height or weight on file to calculate BMI.  General Appearance: NA  Eye Contact:  NA  Speech:  Clear and Coherent  Volume:  Normal  Mood:  "not good"  Affect:  NA  Thought Process:  Coherent  Orientation:  Full (Time, Place, and Person)  Thought Content: Logical   Suicidal Thoughts:  No  Homicidal Thoughts:  No  Memory:  Immediate;   Fair  Judgement:  Good  Insight:  Fair  Psychomotor Activity:  Normal   Concentration:  Concentration: Fair and Attention Span: Fair  Recall:  Poor  Fund of Knowledge: Fair  Language: Good  Akathisia:  No  Handed:  Right  AIMS (if indicated): not done  Assets:  Social Support  ADL's:  Intact  Cognition: WNL  Sleep:  Fair   Screenings: PHQ2-9     [facility] from 04/09/[facility] in [facility]  PHQ-2 Total Score  0       Assessment and Plan:  BRANDI TOMLINSON is a 78 y.o. year old female with a history of bipolar II disorder, major neurocognitive disorder, hypertension, CAD,COPD, TIA, type II DM,, who presents for follow up appointment for Bipolar II disorder (HCC)  Major neurocognitive disorder (HCC)  # Bipolar II disorder Brenda Mcdonald continues to report occasional depressive symptoms and anxiety in the context of pandemic and demoralization from Brenda Mcdonald physical condition.  We will continue Lexapro to target depression.  We will continue mirtazapine as adjunctive treatment for depression.  Discussed potential metabolic side effects and drowsiness.  Will continue lamotrigine for mood dysregulation.  Discussed potential risk of Stevens-Johnson syndrome.  Will continue BuSpar for anxiety.  Discussed behavioral activation. Noted that it is preferable to avoid polypharmacy, Brenda Mcdonald had relapse in Brenda Mcdonald mood symptoms (especially when trying to taper off lamotrigine). Will continue current medication.  Brenda Mcdonald will greatly benefit from supportive therapy/CBT; the Brenda Mcdonald would like to hold off follow up until Brenda Mcdonald is able to see a therapist in person.   #Major neurocognitive disorder Brenda Mcdonald does have a cognitive deficits as characterized by MOCA. Brenda Mcdonald is on memantine, prescribed by PCP. Will continue to monitor.   Plan Brenda Mcdonald have reviewed and updated plans as below 1. Continue lexapro 10 mg daily 2. Continue mirtazapine 30 mg at night 3.Continuelamotrigine100mg  daily 4.Continuequetiapine 50mg  night 5. ContinueBuspar10 mg three times  a day as needed for anxiety  6.Next appointment:in January 7.(Brenda Mcdonald is on memantine 14 mg daily)  Brenda Mcdonald have reviewed suicide assessment in detail. No change in the following assessment.   The Brenda Mcdonald demonstrates the following risk factors for suicide: Chronic risk factors for suicide include: psychiatric disorder of bipolar disorder. Acute risk factorsfor suicide include: unemployment and social withdrawal/isolation. Protective factorsfor this Brenda Mcdonald include: positive social support, coping skills. Considering these factors, the overall suicide risk at this point appears to be low. Brenda Mcdonald isappropriate for outpatient follow up.  05[birth year]31-1985, MD 05/21/2019, 10:27 AM

## 2019-05-21 NOTE — Patient Instructions (Signed)
1. Continue lexapro 10 mg daily 2. Continue mirtazapine 30 mg at night 3.Continuelamotrigine100mg  daily 4.Continuequetiapine 50mg  night 5. ContinueBuspar10 mg three times a day as needed for anxiety  6.Next appointment:in January

## 2019-07-24 ENCOUNTER — Other Ambulatory Visit: Payer: Self-pay | Admitting: Cardiology

## 2019-07-31 DIAGNOSIS — Z79899 Other long term (current) drug therapy: Secondary | ICD-10-CM | POA: Diagnosis not present

## 2019-07-31 DIAGNOSIS — Z299 Encounter for prophylactic measures, unspecified: Secondary | ICD-10-CM | POA: Diagnosis not present

## 2019-07-31 DIAGNOSIS — M170 Bilateral primary osteoarthritis of knee: Secondary | ICD-10-CM | POA: Diagnosis not present

## 2019-07-31 DIAGNOSIS — F0390 Unspecified dementia without behavioral disturbance: Secondary | ICD-10-CM | POA: Diagnosis not present

## 2019-07-31 DIAGNOSIS — F319 Bipolar disorder, unspecified: Secondary | ICD-10-CM | POA: Diagnosis not present

## 2019-07-31 DIAGNOSIS — J449 Chronic obstructive pulmonary disease, unspecified: Secondary | ICD-10-CM | POA: Diagnosis not present

## 2019-07-31 DIAGNOSIS — Z87891 Personal history of nicotine dependence: Secondary | ICD-10-CM | POA: Diagnosis not present

## 2019-08-21 ENCOUNTER — Ambulatory Visit: Payer: Medicare Other | Admitting: Psychiatry

## 2019-08-27 ENCOUNTER — Telehealth (HOSPITAL_COMMUNITY): Payer: Self-pay | Admitting: Psychiatry

## 2019-08-27 MED ORDER — BUSPIRONE HCL 10 MG PO TABS: 10.0000 mg | ORAL_TABLET | Freq: Three times a day (TID) | ORAL | 2 refills | Status: DC | PRN

## 2019-08-27 MED ORDER — MIRTAZAPINE 30 MG PO TABS: 30.0000 mg | ORAL_TABLET | Freq: Every day | ORAL | 2 refills | Status: DC

## 2019-08-27 MED ORDER — QUETIAPINE FUMARATE 50 MG PO TABS: 50.0000 mg | ORAL_TABLET | Freq: Every day | ORAL | 2 refills | Status: DC

## 2019-08-27 MED ORDER — ESCITALOPRAM OXALATE 10 MG PO TABS: 10.0000 mg | ORAL_TABLET | Freq: Every day | ORAL | 2 refills | Status: DC

## 2019-08-27 MED ORDER — LAMOTRIGINE 100 MG PO TABS: 100.0000 mg | ORAL_TABLET | Freq: Every day | ORAL | 2 refills | Status: DC

## 2019-08-27 NOTE — Telephone Encounter (Signed)
Received request for med refills, prescriptions sent.

## 2019-09-05 ENCOUNTER — Encounter: Payer: Self-pay | Admitting: Cardiology

## 2019-09-05 DIAGNOSIS — E1142 Type 2 diabetes mellitus with diabetic polyneuropathy: Secondary | ICD-10-CM | POA: Diagnosis not present

## 2019-09-05 DIAGNOSIS — Z299 Encounter for prophylactic measures, unspecified: Secondary | ICD-10-CM | POA: Diagnosis not present

## 2019-09-05 DIAGNOSIS — Z6832 Body mass index (BMI) 32.0-32.9, adult: Secondary | ICD-10-CM | POA: Diagnosis not present

## 2019-09-05 DIAGNOSIS — E1165 Type 2 diabetes mellitus with hyperglycemia: Secondary | ICD-10-CM | POA: Diagnosis not present

## 2019-09-05 DIAGNOSIS — E1122 Type 2 diabetes mellitus with diabetic chronic kidney disease: Secondary | ICD-10-CM | POA: Diagnosis not present

## 2019-09-05 DIAGNOSIS — J449 Chronic obstructive pulmonary disease, unspecified: Secondary | ICD-10-CM | POA: Diagnosis not present

## 2019-09-11 ENCOUNTER — Encounter: Payer: Self-pay | Admitting: Psychiatry

## 2019-09-11 ENCOUNTER — Ambulatory Visit (INDEPENDENT_AMBULATORY_CARE_PROVIDER_SITE_OTHER): Payer: Medicare Other | Admitting: Psychiatry

## 2019-09-11 ENCOUNTER — Other Ambulatory Visit: Payer: Self-pay

## 2019-09-11 DIAGNOSIS — F3181 Bipolar II disorder: Secondary | ICD-10-CM

## 2019-09-11 DIAGNOSIS — F0390 Unspecified dementia without behavioral disturbance: Secondary | ICD-10-CM

## 2019-09-11 DIAGNOSIS — F0150 Vascular dementia without behavioral disturbance: Secondary | ICD-10-CM

## 2019-09-11 NOTE — Progress Notes (Signed)
[place] MD OP Progress Note  Virtual Visit via Telephone Note  I connected with Brenda Mcdonald on 09/11/19 at 11:30 AM EST by telephone and verified that I am speaking with the correct person using two identifiers.  I discussed the limitations, risks, security and privacy concerns of performing an evaluation and management service by telephone and the availability of in person appointments. I also discussed with the patient that there may be a patient responsible charge related to this service. The patient expressed understanding and agreed to proceed.   09/11/2019 11:27 AM Brenda Mcdonald  MRN:  [id number]  Chief Complaint:  "I was not doing good a few days ago but I am doing better now."  HPI: Patient reported that she is doing better now however she was quite depressed a few days ago.  She stated that she had a bout of depression that lasted for a few weeks.  She stated that she was feeling somewhat dysphoric that she was thinking about going back to the hospital however her symptoms gradually improved on their own.  She stated that she was not sleeping well but lately her sleep has improved.  She denied any suicidal ideations.  She could not identify any specific triggers. She keenly wanted to know about Dr. Modesta Messing and if she could see her face-to-face next time.  Visit Diagnosis:    ICD-10-CM   1. Bipolar II disorder ([place])  F31.81   2. Major neurocognitive disorder (HCC)  F01.50     Past Psychiatric History: Bipolar 2 disorder, neurocognitive disorder  Past Medical History:  Past Medical History:  Diagnosis Date  . Anxiety   . Arthritis   . Bipolar disorder ([place])   . COPD (chronic obstructive pulmonary disease) ([place])   . Coronary atherosclerosis of native coronary artery    Nonobstructive  . Depression   . Diabetes mellitus, type 2 ([place])   . Dysphagia   . Essential hypertension, benign   . GERD (gastroesophageal reflux disease)   . Pneumonia   . Pulmonary nodule July 2009   Nonspecific 5 mm right middle lobe  . TIA (transient ischemic attack)     Past Surgical History:  Procedure Laterality Date  . ABDOMINAL HYSTERECTOMY    . APPENDECTOMY    . CARDIAC CATHETERIZATION  11/2010  . CATARACT EXTRACTION, BILATERAL Bilateral   . CHOLECYSTECTOMY    . DILATION AND CURETTAGE OF UTERUS    . INCISION AND DRAINAGE Left 03/12/2014   Procedure: LEFT INCISION/DRAINAGE DEEP ABSCESS/BURSA/HEMATOMA THIGH/KNEE REGION;  Surgeon: Renette Butters, MD;  Location: [facility];  Service: Orthopedics;  Laterality: Left;  . KNEE ARTHROSCOPY Left   . ORIF DISTAL FEMUR FRACTURE Left 01/24/2014  . ORIF FEMUR FRACTURE Left 01/24/2014   Procedure: OPEN REDUCTION INTERNAL FIXATION (ORIF) DISTAL FEMUR FRACTURE;  Surgeon: Renette Butters, MD;  Location: [facility];  Service: Orthopedics;  Laterality: Left;  . TIBIA FRACTURE SURGERY Left ~ 1959  . TOTAL KNEE ARTHROPLASTY Left 2000  . VESICOVAGINAL FISTULA CLOSURE W/ TAH      Family Psychiatric History: denied  Family History:  Family History  Problem Relation Age of Onset  . Coronary artery disease Unknown     Social History:  Social History   Socioeconomic History  . Marital status: Married    Spouse name: Not on file  . Number of children: Not on file  . Years of education: Not on file  . Highest education level: Not on file  Occupational History  . Not  on file  Tobacco Use  . Smoking status: Former Smoker    Packs/day: 0.50    Years: 58.00    Pack years: 29.00    Types: Cigarettes    Quit date: 07/26/2010    Years since quitting: 9.1  . Smokeless tobacco: Never Used  Substance and Sexual Activity  . Alcohol use: No    Alcohol/week: 0.0 standard drinks  . Drug use: No  . Sexual activity: Never  Other Topics Concern  . Not on file  Social History Narrative  . Not on file   Social Determinants of Health   Financial Resource Strain:   . Difficulty of Paying Living Expenses: Not on file  Food Insecurity:   . Worried About  Programme researcher, broadcasting/film/video in the Last Year: Not on file  . Ran Out of Food in the Last Year: Not on file  Transportation Needs:   . Lack of Transportation (Medical): Not on file  . Lack of Transportation (Non-Medical): Not on file  Physical Activity:   . Days of Exercise per Week: Not on file  . Minutes of Exercise per Session: Not on file  Stress:   . Feeling of Stress : Not on file  Social Connections:   . Frequency of Communication with Friends and Family: Not on file  . Frequency of Social Gatherings with Friends and Family: Not on file  . Attends Religious Services: Not on file  . Active Member of Clubs or Organizations: Not on file  . Attends Banker Meetings: Not on file  . Marital Status: Not on file    Allergies:  Allergies  Allergen Reactions  . Azithromycin     Per MAR  . Penicillins     Tolerated Rocephin (July 2015)  . Tetracycline     Per MAR    Metabolic Disorder Labs: No results found for: HGBA1C, MPG No results found for: PROLACTIN Lab Results  Component Value Date   CHOL  09/06/2008    164        ATP III CLASSIFICATION:  <200     mg/dL   Desirable  211-941  mg/dL   Borderline High  >=740    mg/dL   High          TRIG 86 09/06/2008   HDL 49 09/06/2008   CHOLHDL 3.3 09/06/2008   VLDL 17 09/06/2008   LDLCALC  09/06/2008    98        Total Cholesterol/HDL:CHD Risk Coronary Heart Disease Risk Table                     Men   Women  1/2 Average Risk   3.4   3.3  Average Risk       5.0   4.4  2 X Average Risk   9.6   7.1  3 X Average Risk  23.4   11.0        Use the calculated Patient Ratio above and the CHD Risk Table to determine the patient's CHD Risk.        ATP III CLASSIFICATION (LDL):  <100     mg/dL   Optimal  814-481  mg/dL   Near or Above                    Optimal  130-159  mg/dL   Borderline  856-314  mg/dL   High  >970     mg/dL   Very High  No results found for: TSH  Therapeutic Level Labs: No results found for:  LITHIUM No results found for: VALPROATE No components found for:  CBMZ  Current Medications: Current Outpatient Medications  Medication Sig Dispense Refill  . albuterol (PROVENTIL HFA;VENTOLIN HFA) 108 (90 BASE) MCG/ACT inhaler Inhale 2 puffs into the lungs every 6 (six) hours as needed for wheezing or shortness of breath.    Marland Kitchen amLODipine (NORVASC) 5 MG tablet Take 5 mg by mouth daily.    Marland Kitchen aspirin EC 81 MG tablet Take 81 mg by mouth daily.    Marland Kitchen atorvastatin (LIPITOR) 10 MG tablet Take 1 tablet (10 mg total) by mouth at bedtime. 90 tablet 3  . busPIRone (BUSPAR) 10 MG tablet Take 1 tablet (10 mg total) by mouth 3 (three) times daily as needed (anxiety). 84 tablet 2  . escitalopram (LEXAPRO) 10 MG tablet Take 1 tablet (10 mg total) by mouth daily. 28 tablet 2  . FARXIGA 5 MG TABS tablet Take 1 tablet by mouth daily.    . furosemide (LASIX) 40 MG tablet Take by mouth. Taking 1.5 tabs QD    . glimepiride (AMARYL) 2 MG tablet Take 1 tablet by mouth 2 (two) times daily.    Marland Kitchen HYDROcodone-acetaminophen (NORCO/VICODIN) 5-325 MG per tablet Take 1 tablet by mouth every 4 (four) hours as needed for moderate pain. (Patient taking differently: Take 1 tablet by mouth 2 (two) times daily. ) 30 tablet 0  . ipratropium-albuterol (DUONEB) 0.5-2.5 (3) MG/3ML SOLN Take 3 mLs by nebulization 4 (four) times daily as needed. Shortness of breath    . JANUVIA 50 MG tablet Take 1 tablet by mouth daily.    Marland Kitchen lamoTRIgine (LAMICTAL) 100 MG tablet Take 1 tablet (100 mg total) by mouth at bedtime. 28 tablet 2  . memantine (NAMENDA XR) 14 MG CP24 24 hr capsule Take 14 mg by mouth daily.    . metoprolol tartrate (LOPRESSOR) 25 MG tablet TAKE (1/2) TABLET BY MOUTH TWICE DAILY. 28 tablet 11  . mirtazapine (REMERON) 30 MG tablet Take 1 tablet (30 mg total) by mouth at bedtime. 28 tablet 2  . omeprazole (PRILOSEC) 40 MG capsule Take 20 mg by mouth daily.     . QUEtiapine (SEROQUEL) 50 MG tablet Take 1 tablet (50 mg total) by  mouth at bedtime. 28 tablet 2  . solifenacin (VESICARE) 10 MG tablet Take 10 mg by mouth daily.      Marland Kitchen VITAMIN D, CHOLECALCIFEROL, PO Take 1 tablet by mouth daily.     No current facility-administered medications for this visit.      Psychiatric Specialty Exam: Review of Systems  There were no vitals taken for this visit.There is no height or weight on file to calculate BMI.  General Appearance: unable to assess due to phone visit  Eye Contact:  Unable to assess due to phone visit  Speech:  Clear and Coherent and Normal Rate  Volume:  Normal  Mood:  Euthymic  Affect:  Congruent  Thought Process:  Goal Directed, Linear and Descriptions of Associations: Intact  Orientation:  Full (Time, Place, and Person)  Thought Content: Logical   Suicidal Thoughts:  No  Homicidal Thoughts:  No  Memory:  Recent;   Good Remote;   Good  Judgement:  Good  Insight:  Good  Psychomotor Activity:  Normal  Concentration:  Concentration: Good and Attention Span: Good  Recall:  Good  Fund of Knowledge: Good  Language: Good  Akathisia:  Negative  Handed:  Right  AIMS (if indicated): not done  Assets:  Communication Skills Desire for Improvement Financial Resources/Insurance Housing  ADL's:  Intact  Cognition: WNL  Sleep:  Good     Screenings: PHQ2-9     Office Visit from 04/09/2014 in [facility] for Infectious Disease  PHQ-2 Total Score  0       Assessment and Plan: 79 year old female with history of bipolar disorder now contacted via phone for follow-up.  Patient reported that she had a lot of depressive symptoms going on a few weeks ago however she is feeling better now.  She would like to continue the same regimen for now.  Her prescriptions to be filled were recently sent to her pharmacy on 08/27/2019.  1. Bipolar II disorder (HCC) - Continue buspirone 10 mg 3 times daily. - Continue Lexapro 10 mg daily - Continue Lamictal 100 mg at bedtime - Continue mirtazapine 30 mg  at bedtime - Continue Seroquel 50 mg at bedtime  2. Major neurocognitive disorder (HCC)   Follow-up in 2 months.    Zena Amos, MD 09/11/2019, 11:27 AM

## 2019-09-19 ENCOUNTER — Encounter: Payer: Self-pay | Admitting: *Deleted

## 2019-09-21 ENCOUNTER — Ambulatory Visit: Payer: Medicare Other | Admitting: Cardiology

## 2019-10-03 ENCOUNTER — Other Ambulatory Visit: Payer: Self-pay | Admitting: *Deleted

## 2019-10-03 MED ORDER — ATORVASTATIN CALCIUM 10 MG PO TABS: 10.0000 mg | ORAL_TABLET | Freq: Every day | ORAL | 1 refills | Status: DC

## 2019-10-12 ENCOUNTER — Ambulatory Visit (INDEPENDENT_AMBULATORY_CARE_PROVIDER_SITE_OTHER): Payer: Medicare Other | Admitting: Cardiology

## 2019-10-12 ENCOUNTER — Encounter: Payer: Self-pay | Admitting: *Deleted

## 2019-10-12 ENCOUNTER — Other Ambulatory Visit: Payer: Self-pay

## 2019-10-12 ENCOUNTER — Encounter: Payer: Self-pay | Admitting: Cardiology

## 2019-10-12 VITALS — BP 148/80 | HR 87 | Ht 65.5 in | Wt 199.0 lb

## 2019-10-12 DIAGNOSIS — E782 Mixed hyperlipidemia: Secondary | ICD-10-CM

## 2019-10-12 DIAGNOSIS — I2510 Atherosclerotic heart disease of native coronary artery without angina pectoris: Secondary | ICD-10-CM

## 2019-10-12 DIAGNOSIS — I6523 Occlusion and stenosis of bilateral carotid arteries: Secondary | ICD-10-CM

## 2019-10-12 NOTE — Progress Notes (Signed)
Cardiology Office Note  Date: 10/12/2019   ID: Brenda Mcdonald, DOB August 27, 1940, MRN [id number]  PCP:  Glenda Chroman, MD  Cardiologist:  Rozann Lesches, MD Electrophysiologist:  None   Chief Complaint  Patient presents with  . Cardiac follow-up    History of Present Illness: Brenda Mcdonald is a 79 y.o. female last seen in November 2019.  She is here today with a family member for follow-up.  She does not report any active angina at this point.  Still uses a wheelchair, can only walk to a limited degree using a walker.  She reports leg cramps at nighttime, no definite claudication.  She has had no palpitations or syncope.  Her ECG today which shows sinus rhythm with borderline prolonged PR interval and left bundle branch block.  Similar to previous tracing.  I reviewed her medications which are outlined below.  Cardiac regimen includes Lipitor, Norvasc, as needed Lasix, and Lopressor.  She states that she had lab work per Dr. Woody Seller about 1 month ago which we are requesting for review.  Past Medical History:  Diagnosis Date  . Anxiety   . Arthritis   . Bipolar disorder ([place])   . COPD (chronic obstructive pulmonary disease) ([place])   . Coronary atherosclerosis of native coronary artery    Nonobstructive  . Depression   . Diabetes mellitus, type 2 ([place])   . Dysphagia   . Essential hypertension   . GERD (gastroesophageal reflux disease)   . Pneumonia   . Pulmonary nodule July 2009   Nonspecific 5 mm right middle lobe  . TIA (transient ischemic attack)     Past Surgical History:  Procedure Laterality Date  . ABDOMINAL HYSTERECTOMY    . APPENDECTOMY    . CARDIAC CATHETERIZATION  11/2010  . CATARACT EXTRACTION, BILATERAL Bilateral   . CHOLECYSTECTOMY    . DILATION AND CURETTAGE OF UTERUS    . INCISION AND DRAINAGE Left 03/12/2014   Procedure: LEFT INCISION/DRAINAGE DEEP ABSCESS/BURSA/HEMATOMA THIGH/KNEE REGION;  Surgeon: Renette Butters, MD;  Location: [facility];  Service:  Orthopedics;  Laterality: Left;  . KNEE ARTHROSCOPY Left   . ORIF DISTAL FEMUR FRACTURE Left 01/24/2014  . ORIF FEMUR FRACTURE Left 01/24/2014   Procedure: OPEN REDUCTION INTERNAL FIXATION (ORIF) DISTAL FEMUR FRACTURE;  Surgeon: Renette Butters, MD;  Location: [facility];  Service: Orthopedics;  Laterality: Left;  . TIBIA FRACTURE SURGERY Left ~ 1959  . TOTAL KNEE ARTHROPLASTY Left 2000  . VESICOVAGINAL FISTULA CLOSURE W/ TAH      Current Outpatient Medications  Medication Sig Dispense Refill  . albuterol (PROVENTIL HFA;VENTOLIN HFA) 108 (90 BASE) MCG/ACT inhaler Inhale 2 puffs into the lungs every 6 (six) hours as needed for wheezing or shortness of breath.    Marland Kitchen amLODipine (NORVASC) 5 MG tablet Take 5 mg by mouth daily.    Marland Kitchen atorvastatin (LIPITOR) 10 MG tablet Take 1 tablet (10 mg total) by mouth at bedtime. 90 tablet 1  . busPIRone (BUSPAR) 10 MG tablet Take 1 tablet (10 mg total) by mouth 3 (three) times daily as needed (anxiety). 84 tablet 2  . escitalopram (LEXAPRO) 10 MG tablet Take 1 tablet (10 mg total) by mouth daily. 28 tablet 2  . furosemide (LASIX) 40 MG tablet Take 40 mg by mouth as needed.     Marland Kitchen glimepiride (AMARYL) 2 MG tablet Take 1 tablet by mouth 2 (two) times daily.    Marland Kitchen HYDROcodone-acetaminophen (NORCO/VICODIN) 5-325 MG per tablet Take 1 tablet  by mouth every 4 (four) hours as needed for moderate pain. (Patient taking differently: Take 1 tablet by mouth 2 (two) times daily. ) 30 tablet 0  . ipratropium-albuterol (DUONEB) 0.5-2.5 (3) MG/3ML SOLN Take 3 mLs by nebulization 4 (four) times daily as needed. Shortness of breath    . JANUVIA 50 MG tablet Take 1 tablet by mouth daily.    Marland Kitchen lamoTRIgine (LAMICTAL) 100 MG tablet Take 1 tablet (100 mg total) by mouth at bedtime. 28 tablet 2  . memantine (NAMENDA XR) 14 MG CP24 24 hr capsule Take 14 mg by mouth daily.    . metoprolol tartrate (LOPRESSOR) 25 MG tablet TAKE (1/2) TABLET BY MOUTH TWICE DAILY. 28 tablet 11  . mirtazapine  (REMERON) 30 MG tablet Take 1 tablet (30 mg total) by mouth at bedtime. 28 tablet 2  . VITAMIN D, CHOLECALCIFEROL, PO Take 1 tablet by mouth daily.     No current facility-administered medications for this visit.   Allergies:  Azithromycin, Penicillins, and Tetracycline   ROS:   Limited gait, no recent falls.  Physical Exam: VS:  BP (!) 148/80   Pulse 87   Ht 5' 5.5" (1.664 m)   Wt 199 lb (90.3 kg)   SpO2 92%   BMI 32.61 kg/m , BMI Body mass index is 32.61 kg/m.  Wt Readings from Last 3 Encounters:  10/12/19 199 lb (90.3 kg)  06/19/18 218 lb 12.8 oz (99.2 kg)  08/04/16 200 lb (90.7 kg)    General: Elderly woman, seated in wheelchair. HEENT: Conjunctiva and lids normal, wearing a mask. Neck: Supple, no elevated JVP, bilateral carotid bruits, no thyromegaly. Lungs: Clear to auscultation, nonlabored breathing at rest. Cardiac: Regular rate and rhythm, no S3, soft systolic murmur. Abdomen: Soft, nontender, bowel sounds present. Extremities: Chronic appearing leg edema and venous stasis, distal pulses 2+.  ECG:  An ECG dated 06/19/2018 was personally reviewed today and demonstrated:  Sinus rhythm with borderline prolonged PR interval, PAC, left bundle branch block.  Recent Labwork:  No interval lab work for review today.  Other Studies Reviewed Today:  Echocardiogram June 2012: LVEF 55-60%, mildly thickened aortic valve with moderate aortic regurgitation, MAC with moderate mitral regurgitation, mild tricuspid regurgitation.  Carotid Dopplers 06/28/2018: Summary:  Right Carotid: Velocities in the right ICA are consistent with a 1-39%  stenosis.         Non-hemodynamically significant plaque <50% noted in the  CCA.   Left Carotid: Velocities in the left ICA are consistent with a 1-39%  stenosis.        Non-hemodynamically significant plaque noted in the CCA.   Vertebrals: Bilateral vertebral arteries demonstrate antegrade flow.  Subclavians: Normal flow  hemodynamics were seen in bilateral subclavian        arteries.   Assessment and Plan:  1.  Nonobstructive CAD without active angina symptoms.  ECG reviewed and stable.  We will continue with risk factor modification strategies.  She is on Norvasc, Lipitor, and Toprol-XL.  Also recommended low-dose aspirin.  2.  Mild, asymptomatic carotid artery disease with follow-up carotid Dopplers in December 2019 outlined above.  3.  Mixed hyperlipidemia, she continues on Lipitor.  Requesting recent lab work from PCP.  Medication Adjustments/Labs and Tests Ordered: Current medicines are reviewed at length with the patient today.  Concerns regarding medicines are outlined above.   Tests Ordered: Orders Placed This Encounter  Procedures  . EKG 12-Lead    Medication Changes: No orders of the defined types were placed in  this encounter.   Disposition:  Follow up 1 year in the [place] office.  Signed, Satira Sark, MD, [facility] 10/12/2019 4:59 PM    [facility] [place], [place], [place] [address] Phone: [phone]; Fax: [phone]

## 2019-10-12 NOTE — Patient Instructions (Addendum)

## 2019-10-24 DIAGNOSIS — F329 Major depressive disorder, single episode, unspecified: Secondary | ICD-10-CM | POA: Diagnosis not present

## 2019-10-24 DIAGNOSIS — E785 Hyperlipidemia, unspecified: Secondary | ICD-10-CM | POA: Diagnosis not present

## 2019-11-07 ENCOUNTER — Other Ambulatory Visit: Payer: Self-pay | Admitting: *Deleted

## 2019-11-07 MED ORDER — ATORVASTATIN CALCIUM 10 MG PO TABS: 10.0000 mg | ORAL_TABLET | Freq: Every day | ORAL | 3 refills | Status: DC

## 2019-11-23 DIAGNOSIS — F329 Major depressive disorder, single episode, unspecified: Secondary | ICD-10-CM | POA: Diagnosis not present

## 2019-11-23 DIAGNOSIS — E785 Hyperlipidemia, unspecified: Secondary | ICD-10-CM | POA: Diagnosis not present

## 2019-12-03 ENCOUNTER — Other Ambulatory Visit: Payer: Self-pay | Admitting: *Deleted

## 2019-12-03 MED ORDER — METOPROLOL TARTRATE 25 MG PO TABS: 12.5000 mg | ORAL_TABLET | Freq: Two times a day (BID) | ORAL | 3 refills | Status: DC

## 2019-12-12 DIAGNOSIS — E7800 Pure hypercholesterolemia, unspecified: Secondary | ICD-10-CM | POA: Diagnosis not present

## 2019-12-12 DIAGNOSIS — R5383 Other fatigue: Secondary | ICD-10-CM | POA: Diagnosis not present

## 2019-12-12 DIAGNOSIS — Z79899 Other long term (current) drug therapy: Secondary | ICD-10-CM | POA: Diagnosis not present

## 2019-12-24 DIAGNOSIS — M1990 Unspecified osteoarthritis, unspecified site: Secondary | ICD-10-CM | POA: Diagnosis not present

## 2019-12-24 DIAGNOSIS — E785 Hyperlipidemia, unspecified: Secondary | ICD-10-CM | POA: Diagnosis not present

## 2019-12-27 ENCOUNTER — Other Ambulatory Visit: Payer: Self-pay | Admitting: *Deleted

## 2019-12-27 DIAGNOSIS — J449 Chronic obstructive pulmonary disease, unspecified: Secondary | ICD-10-CM | POA: Diagnosis not present

## 2019-12-27 DIAGNOSIS — F419 Anxiety disorder, unspecified: Secondary | ICD-10-CM | POA: Diagnosis not present

## 2019-12-27 DIAGNOSIS — M549 Dorsalgia, unspecified: Secondary | ICD-10-CM | POA: Diagnosis not present

## 2019-12-27 DIAGNOSIS — F319 Bipolar disorder, unspecified: Secondary | ICD-10-CM | POA: Diagnosis not present

## 2019-12-27 DIAGNOSIS — Z299 Encounter for prophylactic measures, unspecified: Secondary | ICD-10-CM | POA: Diagnosis not present

## 2019-12-27 MED ORDER — ATORVASTATIN CALCIUM 10 MG PO TABS: 10.0000 mg | ORAL_TABLET | Freq: Every day | ORAL | 3 refills | Status: DC

## 2020-01-02 NOTE — Progress Notes (Signed)
Virtual Visit via Telephone Note  I connected with Brenda Mcdonald on 01/04/20 at 10:30 AM EDT by telephone and verified that I am speaking with the correct person using two identifiers.   I discussed the limitations, risks, security and privacy concerns of performing an evaluation and management service by telephone and the availability of in person appointments. I also discussed with the patient that there may be a patient responsible charge related to this service. The patient expressed understanding and agreed to proceed.     I discussed the assessment and treatment plan with the patient. The patient was provided an opportunity to ask questions and all were answered. The patient agreed with the plan and demonstrated an understanding of the instructions.   The patient was advised to call back or seek an in-person evaluation if the symptoms worsen or if the condition fails to improve as anticipated.  Location: patient- home, provider- home office   I provided 15 minutes of non-face-to-face time during this encounter.   Neysa Hotter, MD    [facility] MD/PA/NP OP Progress Note  01/04/2020 10:55 AM Brenda Mcdonald  MRN:  [id number]  Chief Complaint:  Chief Complaint    Depression; Follow-up     HPI:  This is a follow-up appointment for bipolar 2 disorder.  This appointment was made urgently due to patient request.  She states that she has been feeling depressed for the past few weeks.  She has significant anhedonia, and has not been able to do anything. She denies SI, AH, VH.  She asked Brenda Mcdonald to elaborate more.   Brenda Mcdonald, her niece states that Brenda Mcdonald has been depressed for the past few weeks.  She stays in the bed most of the time.  She has appetite loss. Brenda Mcdonald denies any safety concern. Brenda Mcdonald does not recognize any episode of confusion. Brenda Mcdonald has chronic back pain. No recent medication change. She has frequent urination depending on the amount of water she drinks. No known fever. She was  seen by PCP a week ago; she was reportedly checked for kidney function. She was prescribed hydroxyzine, although she took it only a few times. Brenda Mcdonald takes care of her medication, and Brenda Mcdonald takes it regularly.     Visit Diagnosis:    ICD-10-CM   1. MDD (major depressive disorder), recurrent episode, moderate (HCC)  F33.1 TSH    CBC    Comprehensive Metabolic Panel (CMET)  2. Fatigue, unspecified type  R53.83 Urinalysis    Past Psychiatric History: Please see initial evaluation for full details. I have reviewed the history. No updates at this time.     Past Medical History:  Past Medical History:  Diagnosis Date  . Anxiety   . Arthritis   . Bipolar disorder ([facility])   . COPD (chronic obstructive pulmonary disease) ([facility])   . Coronary atherosclerosis of native coronary artery    Nonobstructive  . Depression   . Diabetes mellitus, type 2 ([facility])   . Dysphagia   . Essential hypertension   . GERD (gastroesophageal reflux disease)   . Pneumonia   . Pulmonary nodule July 2009   Nonspecific 5 mm right middle lobe  . TIA (transient ischemic attack)     Past Surgical History:  Procedure Laterality Date  . ABDOMINAL HYSTERECTOMY    . APPENDECTOMY    . CARDIAC CATHETERIZATION  11/2010  . CATARACT EXTRACTION, BILATERAL Bilateral   . CHOLECYSTECTOMY    . DILATION AND CURETTAGE OF UTERUS    . INCISION AND DRAINAGE Left  03/12/2014   Procedure: LEFT INCISION/DRAINAGE DEEP ABSCESS/BURSA/HEMATOMA THIGH/KNEE REGION;  Surgeon: [name], MD;  Location: [facility] OR;  Service: Orthopedics;  Laterality: Left;  . KNEE ARTHROSCOPY Left   . ORIF DISTAL FEMUR FRACTURE Left 01/24/2014  . ORIF FEMUR FRACTURE Left 01/24/2014   Procedure: OPEN REDUCTION INTERNAL FIXATION (ORIF) DISTAL FEMUR FRACTURE;  Surgeon: [name], MD;  Location: [facility] OR;  Service: Orthopedics;  Laterality: Left;  . TIBIA FRACTURE SURGERY Left ~ 1959  . TOTAL KNEE ARTHROPLASTY Left 2000  . VESICOVAGINAL FISTULA CLOSURE W/ TAH       Family Psychiatric History: Please see initial evaluation for full details. I have reviewed the history. No updates at this time.     Family History:  Family History  Problem Relation Age of Onset  . Coronary artery disease Other     Social History:  Social History   Socioeconomic History  . Marital status: Married    Spouse name: Not on file  . Number of children: Not on file  . Years of education: Not on file  . Highest education level: Not on file  Occupational History  . Not on file  Tobacco Use  . Smoking status: Former Smoker    Packs/day: 0.50    Years: 58.00    Pack years: 29.00    Types: Cigarettes    Quit date: 07/26/2010    Years since quitting: 9.4  . Smokeless tobacco: Never Used  Substance and Sexual Activity  . Alcohol use: No    Alcohol/week: 0.0 standard drinks  . Drug use: No  . Sexual activity: Never  Other Topics Concern  . Not on file  Social History Narrative  . Not on file   Social Determinants of Health   Financial Resource Strain:   . Difficulty of Paying Living Expenses:   Food Insecurity:   . Worried About Programme researcher, broadcasting/film/video in the Last Year:   . Barista in the Last Year:   Transportation Needs:   . Freight forwarder (Medical):   Marland Kitchen Lack of Transportation (Non-Medical):   Physical Activity:   . Days of Exercise per Week:   . Minutes of Exercise per Session:   Stress:   . Feeling of Stress :   Social Connections:   . Frequency of Communication with Friends and Family:   . Frequency of Social Gatherings with Friends and Family:   . Attends Religious Services:   . Active Member of Clubs or Organizations:   . Attends Banker Meetings:   Marland Kitchen Marital Status:     Allergies:  Allergies  Allergen Reactions  . Azithromycin     Per MAR  . Penicillins     Tolerated Rocephin (July 2015)  . Tetracycline     Per MAR    Metabolic Disorder Labs: No results found for: HGBA1C, MPG No results found for:  PROLACTIN Lab Results  Component Value Date   CHOL  09/06/2008    164        ATP III CLASSIFICATION:  <200     mg/dL   Desirable  702-637  mg/dL   Borderline High  >=858    mg/dL   High          TRIG 86 09/06/2008   HDL 49 09/06/2008   CHOLHDL 3.3 09/06/2008   VLDL 17 09/06/2008   LDLCALC  09/06/2008    98        Total Cholesterol/HDL:CHD Risk Coronary Heart  Disease Risk Table                     Men   Women  1/2 Average Risk   3.4   3.3  Average Risk       5.0   4.4  2 X Average Risk   9.6   7.1  3 X Average Risk  23.4   11.0        Use the calculated Patient Ratio above and the CHD Risk Table to determine the patient's CHD Risk.        ATP III CLASSIFICATION (LDL):  <100     mg/dL   Optimal  100-129  mg/dL   Near or Above                    Optimal  130-159  mg/dL   Borderline  160-189  mg/dL   High  >190     mg/dL   Very High   No results found for: TSH  Therapeutic Level Labs: No results found for: LITHIUM No results found for: VALPROATE No components found for:  CBMZ  Current Medications: Current Outpatient Medications  Medication Sig Dispense Refill  . albuterol (PROVENTIL HFA;VENTOLIN HFA) 108 (90 BASE) MCG/ACT inhaler Inhale 2 puffs into the lungs every 6 (six) hours as needed for wheezing or shortness of breath.    Marland Kitchen amLODipine (NORVASC) 5 MG tablet Take 5 mg by mouth daily.    Marland Kitchen atorvastatin (LIPITOR) 10 MG tablet Take 1 tablet (10 mg total) by mouth at bedtime. 90 tablet 3  . busPIRone (BUSPAR) 10 MG tablet Take 1 tablet (10 mg total) by mouth 3 (three) times daily as needed (anxiety). 84 tablet 0  . escitalopram (LEXAPRO) 10 MG tablet Take 1 tablet (10 mg total) by mouth daily. 28 tablet 0  . furosemide (LASIX) 40 MG tablet Take 40 mg by mouth as needed.     Marland Kitchen glimepiride (AMARYL) 2 MG tablet Take 1 tablet by mouth 2 (two) times daily.    Marland Kitchen HYDROcodone-acetaminophen (NORCO/VICODIN) 5-325 MG per tablet Take 1 tablet by mouth every 4 (four) hours as  needed for moderate pain. (Patient taking differently: Take 1 tablet by mouth 2 (two) times daily. ) 30 tablet 0  . ipratropium-albuterol (DUONEB) 0.5-2.5 (3) MG/3ML SOLN Take 3 mLs by nebulization 4 (four) times daily as needed. Shortness of breath    . JANUVIA 50 MG tablet Take 1 tablet by mouth daily.    Marland Kitchen lamoTRIgine (LAMICTAL) 100 MG tablet Take 1 tablet (100 mg total) by mouth at bedtime. 28 tablet 0  . memantine (NAMENDA XR) 14 MG CP24 24 hr capsule Take 14 mg by mouth daily.    . metoprolol tartrate (LOPRESSOR) 25 MG tablet Take 0.5 tablets (12.5 mg total) by mouth 2 (two) times daily. 90 tablet 3  . mirtazapine (REMERON) 30 MG tablet Take 1 tablet (30 mg total) by mouth at bedtime. 28 tablet 0  . VITAMIN D, CHOLECALCIFEROL, PO Take 1 tablet by mouth daily.     No current facility-administered medications for this visit.     Musculoskeletal: Strength & Muscle Tone: N/A Gait & Station: N/A Patient leans: N/A  Psychiatric Specialty Exam: Review of Systems  Psychiatric/Behavioral: Positive for dysphoric mood. Negative for agitation, behavioral problems, confusion, decreased concentration, hallucinations, self-injury, sleep disturbance and suicidal ideas. The patient is nervous/anxious. The patient is not hyperactive.   All other systems reviewed and are negative.  There were no vitals taken for this visit.There is no height or weight on file to calculate BMI.  General Appearance: NA  Eye Contact:  NA  Speech:  Clear and Coherent  Volume:  Normal  Mood:  Depressed  Affect:  NA  Thought Process:  Coherent  Orientation:  Full (Time, Place, and Person)  Thought Content: Logical   Suicidal Thoughts:  No  Homicidal Thoughts:  No  Memory:  Immediate;   Good  Judgement:  Good  Insight:  Fair  Psychomotor Activity:  Normal  Concentration:  Concentration: Good and Attention Span: Good  Recall:  Good  Fund of Knowledge: Good  Language: Good  Akathisia:  No  Handed:  Right   AIMS (if indicated): not done  Assets:  Communication Skills Desire for Improvement  ADL's:  Intact  Cognition: WNL  Sleep:  hypersomnia   Screenings: PHQ2-9     Office Visit from 04/09/2014 in [facility] for Infectious Disease  PHQ-2 Total Score 0       Assessment and Plan:  KHILA PAPP is a 79 y.o. year old female with a history of bipolar II disorder, major neurocognitive disorder, hypertension, CAD,COPD, TIA, type II DM,, who presents for follow up appointment for below.   1. MDD (major depressive disorder), recurrent episode, moderate (HCC) 2. Fatigue, unspecified type The patient and Brenda Mcdonald, her niece both reports worsening in depression and fatigue over the past few weeks without any significant triggers with prominent appetite loss and fatigue.  We will obtain labs to rule out medical cause which could be contributing to fatigue.  Will continue medication at the same dose while waiting for lab test result given she is already on multiple of psychotropics.  We will continue Lexapro to target depression.  We will continue mirtazapine as adjunctive treatment for depression.  We will continue lamotrigine for mood stabilization.  She is aware of potential risk of Stevens-Johnson syndrome.  We will continue BuSpar for anxiety.  Noted that she did have a relapse in her mood symptoms, which coincided with trying to taper off lamotrigine and other psychotropics. Although she will great benefit from CBT, she would like to hold this until in person visit.   #Major neurocognitive disorder She does have a cognitive deficits as characterized by MOCA. She is on memantine, prescribed by PCP. Will continue to monitor.   Plan I have reviewed and updated plans as below 1. Continue lexapro 10 mg daily 2.Continue mirtazapine 30mg  at night 3.Continuelamotrigine100mg  daily 4.Continuequetiapine 50mg  night 5. ContinueBuspar10 mg three times a day as needed for  anxiety  6.Next appointment- 7/2 at 11 AM for 30 mins, video 7.(She is on memantine 14 mg daily)   The patient demonstrates the following risk factors for suicide: Chronic risk factors for suicide include: psychiatric disorder of bipolar disorder. Acute risk factorsfor suicide include: unemployment and social withdrawal/isolation. Protective factorsfor this patient include: positive social support, coping skills. Considering these factors, the overall suicide risk at this point appears to be low. Patient isappropriate for outpatient follow up.  , MD 01/04/2020, 10:55 AM

## 2020-01-04 ENCOUNTER — Other Ambulatory Visit: Payer: Self-pay

## 2020-01-04 ENCOUNTER — Telehealth (INDEPENDENT_AMBULATORY_CARE_PROVIDER_SITE_OTHER): Payer: Medicare Other | Admitting: Psychiatry

## 2020-01-04 ENCOUNTER — Encounter (HOSPITAL_COMMUNITY): Payer: Self-pay | Admitting: Psychiatry

## 2020-01-04 DIAGNOSIS — R5383 Other fatigue: Secondary | ICD-10-CM | POA: Diagnosis not present

## 2020-01-04 DIAGNOSIS — F331 Major depressive disorder, recurrent, moderate: Secondary | ICD-10-CM

## 2020-01-04 MED ORDER — ESCITALOPRAM OXALATE 10 MG PO TABS: 10.0000 mg | ORAL_TABLET | Freq: Every day | ORAL | 0 refills | Status: DC

## 2020-01-04 MED ORDER — MIRTAZAPINE 30 MG PO TABS: 30.0000 mg | ORAL_TABLET | Freq: Every day | ORAL | 0 refills | Status: DC

## 2020-01-04 MED ORDER — LAMOTRIGINE 100 MG PO TABS: 100.0000 mg | ORAL_TABLET | Freq: Every day | ORAL | 0 refills | Status: DC

## 2020-01-04 MED ORDER — BUSPIRONE HCL 10 MG PO TABS: 10.0000 mg | ORAL_TABLET | Freq: Three times a day (TID) | ORAL | 0 refills | Status: DC | PRN

## 2020-01-04 NOTE — Addendum Note (Signed)
Addended by: Neysa Hotter on: 01/04/2020 11:55 AM   Modules accepted: Orders

## 2020-01-07 ENCOUNTER — Telehealth (HOSPITAL_COMMUNITY): Payer: Self-pay | Admitting: *Deleted

## 2020-01-07 NOTE — Telephone Encounter (Signed)
Katrina (case manager) [phone] called stating that patient is not doing well. Per Katrina, she is currently with the patient and wanted to see if patient could get into see provider at a sooner time and date. Staff informed case manager that patient just had a visit with provider last week. Per case manager, patient nerves has been shot and she is just not doing good and very upset. Per case manager patient is informing her that she is having trouble with understanding provider over the phone. Per case manger she wonders if she should just have patient talk to her therapist or just peak with provider. Message will be sent to provider.

## 2020-01-07 NOTE — Telephone Encounter (Signed)
Ms. Brenda Mcdonald, the patient niece was with the patient at the recent visit due to difficulty in communication. I advised them to have laboratory work, which Ms. Brenda Mcdonald said that they will get it today. If it is not done yet, please advise to get it sooner to make sure to rule out medical cause contributing to her symptoms. I am planning to inform further recommendation/treatment based on the test result. In the mean time, I think it would be good if she has follow up with [name] if she has any opening and if the patient is amenable as the patient declined it in the past. Please also discuss with the case manager about the contact person for the patient care to avoid any miscommunication. I thought it was Ms. Brenda Mcdonald, who will be attending at her next visit, although I do appreciate the input from [name].

## 2020-01-08 ENCOUNTER — Telehealth (HOSPITAL_COMMUNITY): Payer: Medicare Other | Admitting: Psychiatry

## 2020-01-09 NOTE — Telephone Encounter (Signed)
Appt with Brenda Mcdonald was scheduled with patient for July 1 and pt agreed with appt.

## 2020-01-09 NOTE — Telephone Encounter (Signed)
Spoke with [name] and she stated that patient was going to go to the lab today to get her labs completed that was stated by the provider.

## 2020-01-10 LAB — COMPREHENSIVE METABOLIC PANEL
AG Ratio: 1.6 (calc) (ref 1.0–2.5)
ALT: 13 U/L (ref 6–29)
AST: 13 U/L (ref 10–35)
Albumin: 4.1 g/dL (ref 3.6–5.1)
Alkaline phosphatase (APISO): 69 U/L (ref 37–153)
BUN/Creatinine Ratio: 13 (calc) (ref 6–22)
BUN: 19 mg/dL (ref 7–25)
CO2: 30 mmol/L (ref 20–32)
Calcium: 9.3 mg/dL (ref 8.6–10.4)
Chloride: 103 mmol/L (ref 98–110)
Creat: 1.47 mg/dL — ABNORMAL HIGH (ref 0.60–0.93)
Globulin: 2.5 g/dL (calc) (ref 1.9–3.7)
Glucose, Bld: 135 mg/dL — ABNORMAL HIGH (ref 65–99)
Potassium: 4.7 mmol/L (ref 3.5–5.3)
Sodium: 141 mmol/L (ref 135–146)
Total Bilirubin: 0.4 mg/dL (ref 0.2–1.2)
Total Protein: 6.6 g/dL (ref 6.1–8.1)

## 2020-01-10 LAB — CBC
HCT: 41.4 % (ref 35.0–45.0)
Hemoglobin: 13.2 g/dL (ref 11.7–15.5)
MCH: 26.3 pg — ABNORMAL LOW (ref 27.0–33.0)
MCHC: 31.9 g/dL — ABNORMAL LOW (ref 32.0–36.0)
MCV: 82.5 fL (ref 80.0–100.0)
MPV: 9.7 fL (ref 7.5–12.5)
Platelets: 207 10*3/uL (ref 140–400)
RBC: 5.02 10*6/uL (ref 3.80–5.10)
RDW: 13.1 % (ref 11.0–15.0)
WBC: 8.3 10*3/uL (ref 3.8–10.8)

## 2020-01-10 LAB — URINALYSIS
Bilirubin Urine: NEGATIVE
Glucose, UA: NEGATIVE
Hgb urine dipstick: NEGATIVE
Ketones, ur: NEGATIVE
Leukocytes,Ua: NEGATIVE
Nitrite: NEGATIVE
Protein, ur: NEGATIVE
Specific Gravity, Urine: 1.009 (ref 1.001–1.03)
pH: 7 (ref 5.0–8.0)

## 2020-01-10 LAB — TSH: TSH: 0.71 mIU/L (ref 0.40–4.50)

## 2020-01-10 NOTE — Progress Notes (Signed)
Reviewed her lab tests. Her creatinine is higher than normal range. Please advise the patient to contact her PCP for further evaluation/treatment. Noted that I am not planning to change her medication at this time until she is evaluated by her PCP given her current symptoms may be attributable to some medication condition.

## 2020-01-22 NOTE — Progress Notes (Deleted)
BH MD/PA/NP OP Progress Note  01/22/2020 8:49 AM Brenda Mcdonald  MRN:  [id number]  Chief Complaint:  HPI: *** Visit Diagnosis: No diagnosis found.  Past Psychiatric History: Please see initial evaluation for full details. I have reviewed the history. No updates at this time.     Past Medical History:  Past Medical History:  Diagnosis Date  . Anxiety   . Arthritis   . Bipolar disorder (HCC)   . COPD (chronic obstructive pulmonary disease) (HCC)   . Coronary atherosclerosis of native coronary artery    Nonobstructive  . Depression   . Diabetes mellitus, type 2 (HCC)   . Dysphagia   . Essential hypertension   . GERD (gastroesophageal reflux disease)   . Pneumonia   . Pulmonary nodule July 2009   Nonspecific 5 mm right middle lobe  . TIA (transient ischemic attack)     Past Surgical History:  Procedure Laterality Date  . ABDOMINAL HYSTERECTOMY    . APPENDECTOMY    . CARDIAC CATHETERIZATION  11/2010  . CATARACT EXTRACTION, BILATERAL Bilateral   . CHOLECYSTECTOMY    . DILATION AND CURETTAGE OF UTERUS    . INCISION AND DRAINAGE Left 03/12/2014   Procedure: LEFT INCISION/DRAINAGE DEEP ABSCESS/BURSA/HEMATOMA THIGH/KNEE REGION;  Surgeon: [name], MD;  Location: [facility];  Service: Orthopedics;  Laterality: Left;  . KNEE ARTHROSCOPY Left   . ORIF DISTAL FEMUR FRACTURE Left 01/24/2014  . ORIF FEMUR FRACTURE Left 01/24/2014   Procedure: OPEN REDUCTION INTERNAL FIXATION (ORIF) DISTAL FEMUR FRACTURE;  Surgeon: [name], MD;  Location: [facility] OR;  Service: Orthopedics;  Laterality: Left;  . TIBIA FRACTURE SURGERY Left ~ 1959  . TOTAL KNEE ARTHROPLASTY Left 2000  . VESICOVAGINAL FISTULA CLOSURE W/ TAH      Family Psychiatric History: Please see initial evaluation for full details. I have reviewed the history. No updates at this time.     Family History:  Family History  Problem Relation Age of Onset  . Coronary artery disease Other     Social History:  Social History    Socioeconomic History  . Marital status: Married    Spouse name: Not on file  . Number of children: Not on file  . Years of education: Not on file  . Highest education level: Not on file  Occupational History  . Not on file  Tobacco Use  . Smoking status: Former Smoker    Packs/day: 0.50    Years: 58.00    Pack years: 29.00    Types: Cigarettes    Quit date: 07/26/2010    Years since quitting: 9.4  . Smokeless tobacco: Never Used  Substance and Sexual Activity  . Alcohol use: No    Alcohol/week: 0.0 standard drinks  . Drug use: No  . Sexual activity: Never  Other Topics Concern  . Not on file  Social History Narrative  . Not on file   Social Determinants of Health   Financial Resource Strain:   . Difficulty of Paying Living Expenses:   Food Insecurity:   . Worried About Programme researcher, broadcasting/film/video in the Last Year:   . Barista in the Last Year:   Transportation Needs:   . Freight forwarder (Medical):   Marland Kitchen Lack of Transportation (Non-Medical):   Physical Activity:   . Days of Exercise per Week:   . Minutes of Exercise per Session:   Stress:   . Feeling of Stress :   Social Connections:   .  Frequency of Communication with Friends and Family:   . Frequency of Social Gatherings with Friends and Family:   . Attends Religious Services:   . Active Member of Clubs or Organizations:   . Attends Banker Meetings:   Marland Kitchen Marital Status:     Allergies:  Allergies  Allergen Reactions  . Azithromycin     Per MAR  . Penicillins     Tolerated Rocephin (July 2015)  . Tetracycline     Per MAR    Metabolic Disorder Labs: No results found for: HGBA1C, MPG No results found for: PROLACTIN Lab Results  Component Value Date   CHOL  09/06/2008    164        ATP III CLASSIFICATION:  <200     mg/dL   Desirable  244-010  mg/dL   Borderline High  >=272    mg/dL   High          TRIG 86 09/06/2008   HDL 49 09/06/2008   CHOLHDL 3.3 09/06/2008   VLDL 17  09/06/2008   LDLCALC  09/06/2008    98        Total Cholesterol/HDL:CHD Risk Coronary Heart Disease Risk Table                     Men   Women  1/2 Average Risk   3.4   3.3  Average Risk       5.0   4.4  2 X Average Risk   9.6   7.1  3 X Average Risk  23.4   11.0        Use the calculated Patient Ratio above and the CHD Risk Table to determine the patient's CHD Risk.        ATP III CLASSIFICATION (LDL):  <100     mg/dL   Optimal  536-644  mg/dL   Near or Above                    Optimal  130-159  mg/dL   Borderline  034-742  mg/dL   High  >595     mg/dL   Very High   Lab Results  Component Value Date   TSH 0.71 01/09/2020    Therapeutic Level Labs: No results found for: LITHIUM No results found for: VALPROATE No components found for:  CBMZ  Current Medications: Current Outpatient Medications  Medication Sig Dispense Refill  . albuterol (PROVENTIL HFA;VENTOLIN HFA) 108 (90 BASE) MCG/ACT inhaler Inhale 2 puffs into the lungs every 6 (six) hours as needed for wheezing or shortness of breath.    Marland Kitchen amLODipine (NORVASC) 5 MG tablet Take 5 mg by mouth daily.    Marland Kitchen atorvastatin (LIPITOR) 10 MG tablet Take 1 tablet (10 mg total) by mouth at bedtime. 90 tablet 3  . busPIRone (BUSPAR) 10 MG tablet Take 1 tablet (10 mg total) by mouth 3 (three) times daily as needed (anxiety). 84 tablet 0  . escitalopram (LEXAPRO) 10 MG tablet Take 1 tablet (10 mg total) by mouth daily. 28 tablet 0  . furosemide (LASIX) 40 MG tablet Take 40 mg by mouth as needed.     Marland Kitchen glimepiride (AMARYL) 2 MG tablet Take 1 tablet by mouth 2 (two) times daily.    Marland Kitchen HYDROcodone-acetaminophen (NORCO/VICODIN) 5-325 MG per tablet Take 1 tablet by mouth every 4 (four) hours as needed for moderate pain. (Patient taking differently: Take 1 tablet by mouth 2 (two) times daily. ) 30  tablet 0  . ipratropium-albuterol (DUONEB) 0.5-2.5 (3) MG/3ML SOLN Take 3 mLs by nebulization 4 (four) times daily as needed. Shortness of breath     . JANUVIA 50 MG tablet Take 1 tablet by mouth daily.    Marland Kitchen lamoTRIgine (LAMICTAL) 100 MG tablet Take 1 tablet (100 mg total) by mouth at bedtime. 28 tablet 0  . memantine (NAMENDA XR) 14 MG CP24 24 hr capsule Take 14 mg by mouth daily.    . metoprolol tartrate (LOPRESSOR) 25 MG tablet Take 0.5 tablets (12.5 mg total) by mouth 2 (two) times daily. 90 tablet 3  . mirtazapine (REMERON) 30 MG tablet Take 1 tablet (30 mg total) by mouth at bedtime. 28 tablet 0  . VITAMIN D, CHOLECALCIFEROL, PO Take 1 tablet by mouth daily.     No current facility-administered medications for this visit.     Musculoskeletal: Strength & Muscle Tone: N/A Gait & Station: N/A Patient leans: N/A  Psychiatric Specialty Exam: Review of Systems  There were no vitals taken for this visit.There is no height or weight on file to calculate BMI.  General Appearance: {Appearance:22683}  Eye Contact:  {BHH EYE CONTACT:22684}  Speech:  Clear and Coherent  Volume:  Normal  Mood:  {BHH MOOD:22306}  Affect:  {Affect (PAA):22687}  Thought Process:  Coherent  Orientation:  Full (Time, Place, and Person)  Thought Content: Logical   Suicidal Thoughts:  {ST/HT (PAA):22692}  Homicidal Thoughts:  {ST/HT (PAA):22692}  Memory:  Immediate;   Good  Judgement:  {Judgement (PAA):22694}  Insight:  {Insight (PAA):22695}  Psychomotor Activity:  Normal  Concentration:  Concentration: Good and Attention Span: Good  Recall:  Good  Fund of Knowledge: Good  Language: Good  Akathisia:  No  Handed:  Right  AIMS (if indicated): not done  Assets:  Communication Skills Desire for Improvement  ADL's:  Intact  Cognition: WNL  Sleep:  {BHH GOOD/FAIR/POOR:22877}   Screenings: PHQ2-9     Office Visit from 04/09/2014 in [facility] for Infectious Disease  PHQ-2 Total Score 0       Assessment and Plan:  Brenda Mcdonald is a 79 y.o. year old female with a history of  bipolar II disorder, major neurocognitive disorder,  hypertension,CAD,COPD, TIA, type II DM, , who presents for follow up appointment for below.    1. MDD (major depressive disorder), recurrent episode, moderate (HCC) 2. Fatigue, unspecified type The patient and [name], her niece both reports worsening in depression and fatigue over the past few weeks without any significant triggers with prominent appetite loss and fatigue.  We will obtain labs to rule out medical cause which could be contributing to fatigue.  Will continue medication at the same dose while waiting for lab test result given she is already on multiple of psychotropics.  We will continue Lexapro to target depression.  We will continue mirtazapine as adjunctive treatment for depression.  We will continue lamotrigine for mood stabilization.  She is aware of potential risk of Stevens-Johnson syndrome.  We will continue BuSpar for anxiety.  Noted that she did have a relapse in her mood symptoms, which coincided with trying to taper off lamotrigine and other psychotropics. Although she will great benefit from CBT, she would like to hold this until in person visit.   #Major neurocognitive disorder She does have acognitive deficits as characterized by MOCA. She is on memantine, prescribed by PCP. Will continue to monitor.  Plan  1. Continue lexapro 10 mg daily 2.Continuemirtazapine 30mg  at  night 3.Continuelamotrigine100mg  daily 4.Continuequetiapine 50mg  night 5. ContinueBuspar10 mg three times a day as needed for anxiety  6.Next appointment- 7/2 at 11 AM for 30 mins, video 7.(She is on memantine 14 mg daily)   The patient demonstrates the following risk factors for suicide: Chronic risk factors for suicide include: psychiatric disorder of bipolar disorder. Acute risk factorsfor suicide include: unemployment and social withdrawal/isolation. Protective factorsfor this patient include: positive social support, coping skills. Considering these factors, the  overall suicide risk at this point appears to be low. Patient isappropriate for outpatient follow up.  Neysa Hotter, MD 01/22/2020, 8:49 AM

## 2020-01-23 DIAGNOSIS — F329 Major depressive disorder, single episode, unspecified: Secondary | ICD-10-CM | POA: Diagnosis not present

## 2020-01-23 DIAGNOSIS — M549 Dorsalgia, unspecified: Secondary | ICD-10-CM | POA: Diagnosis not present

## 2020-01-24 ENCOUNTER — Ambulatory Visit (HOSPITAL_COMMUNITY): Payer: Medicare Other | Admitting: Psychiatry

## 2020-01-25 ENCOUNTER — Telehealth (HOSPITAL_COMMUNITY): Payer: Medicare Other | Admitting: Psychiatry

## 2020-02-12 DIAGNOSIS — Z23 Encounter for immunization: Secondary | ICD-10-CM | POA: Diagnosis not present

## 2020-02-22 DIAGNOSIS — Z720 Tobacco use: Secondary | ICD-10-CM | POA: Diagnosis not present

## 2020-02-22 DIAGNOSIS — J449 Chronic obstructive pulmonary disease, unspecified: Secondary | ICD-10-CM | POA: Diagnosis not present

## 2020-02-22 DIAGNOSIS — I129 Hypertensive chronic kidney disease with stage 1 through stage 4 chronic kidney disease, or unspecified chronic kidney disease: Secondary | ICD-10-CM | POA: Diagnosis not present

## 2020-02-22 DIAGNOSIS — N183 Chronic kidney disease, stage 3 unspecified: Secondary | ICD-10-CM | POA: Diagnosis not present

## 2020-02-25 ENCOUNTER — Telehealth (HOSPITAL_COMMUNITY): Payer: Self-pay

## 2020-02-25 ENCOUNTER — Other Ambulatory Visit (HOSPITAL_COMMUNITY): Payer: Self-pay | Admitting: Psychiatry

## 2020-02-25 MED ORDER — ESCITALOPRAM OXALATE 10 MG PO TABS: 10.0000 mg | ORAL_TABLET | Freq: Every day | ORAL | 0 refills | Status: DC

## 2020-02-25 MED ORDER — LAMOTRIGINE 100 MG PO TABS: 100.0000 mg | ORAL_TABLET | Freq: Every day | ORAL | 0 refills | Status: DC

## 2020-02-25 MED ORDER — MIRTAZAPINE 30 MG PO TABS: 30.0000 mg | ORAL_TABLET | Freq: Every day | ORAL | 0 refills | Status: DC

## 2020-02-25 MED ORDER — BUSPIRONE HCL 10 MG PO TABS: 10.0000 mg | ORAL_TABLET | Freq: Three times a day (TID) | ORAL | 0 refills | Status: DC | PRN

## 2020-02-25 NOTE — Telephone Encounter (Signed)
Medication management - Called patient back to inform Dr. Vanetta Shawl sent in all new refill orders to [facility] today and reminded of next appt 03/11/20.

## 2020-02-25 NOTE — Telephone Encounter (Signed)
Medication refill requests. - Spoke to pt about her need for a new order of all medications, all last filled 01/04/20 but she does not have enough until next appt 03/11/20. Pt requests all 4 new orders be sent in today as she has been out since 02/23/20. Agreed to send request for these to Dr. Vanetta Shawl with request to fill as soon as possible.

## 2020-02-25 NOTE — Telephone Encounter (Signed)
Ordered

## 2020-03-05 NOTE — Progress Notes (Signed)
Virtual Visit via Telephone Note  I connected with DAEJA HELDERMAN on 03/11/20 at  8:30 AM EDT by telephone and verified that I am speaking with the correct person using two identifiers.   I discussed the limitations, risks, security and privacy concerns of performing an evaluation and management service by telephone and the availability of in person appointments. I also discussed with the patient that there may be a patient responsible charge related to this service. The patient expressed understanding and agreed to proceed.    I discussed the assessment and treatment plan with the patient. The patient was provided an opportunity to ask questions and all were answered. The patient agreed with the plan and demonstrated an understanding of the instructions.   The patient was advised to call back or seek an in-person evaluation if the symptoms worsen or if the condition fails to improve as anticipated.  Location: patient- home, provider- home office    I provided 15 minutes of non-face-to-face time during this encounter.   Neysa Hotter, MD    [facility] MD/PA/NP OP Progress Note  03/11/2020 8:52 AM CHAMYA HUNTON  MRN:  [id number]  Chief Complaint:  Chief Complaint    Depression; Other; Follow-up     HPI:  This is a follow-up appointment for depression and neurocognitive disorder.  She states that she has been doing better.  However, she feels nervous and asks if BuSpar can be uptitrated. She states that she has not been able to do as much compared to before due to limitation in physical activity. She tries to keep doing what she is doing. She then asks if Buspar can be uptitrated.  She feels down at times.  She has good appetite.  She has mild anhedonia.  She feels fatigue.  She denies SI.  She feels anxious.   Husband of her niece. Mr. Berkley Harvey presents to the interview.  Cathleen Corti has been doing better except that she has shortness of breath. She eats well and sleeps well. She appears  to be anxious at times. No safety concern.   Functional Status Instrumental Activities of Daily Living (IADLs):  TAHARA RUFFINI is independent in the following: managing finances, medications, driving Requires assistance with the following:   Activities of Daily Living (ADLs):  CHARDAY CAPETILLO is independent in the following: bathing and hygiene, feeding, grooming and toileting Dependent- continence   Visit Diagnosis:    ICD-10-CM   1. MDD (major depressive disorder), recurrent episode, mild (HCC)  F33.0     Past Psychiatric History: Please see initial evaluation for full details. I have reviewed the history. No updates at this time.     Past Medical History:  Past Medical History:  Diagnosis Date  . Anxiety   . Arthritis   . Bipolar disorder (HCC)   . COPD (chronic obstructive pulmonary disease) (HCC)   . Coronary atherosclerosis of native coronary artery    Nonobstructive  . Depression   . Diabetes mellitus, type 2 (HCC)   . Dysphagia   . Essential hypertension   . GERD (gastroesophageal reflux disease)   . Pneumonia   . Pulmonary nodule July 2009   Nonspecific 5 mm right middle lobe  . TIA (transient ischemic attack)     Past Surgical History:  Procedure Laterality Date  . ABDOMINAL HYSTERECTOMY    . APPENDECTOMY    . CARDIAC CATHETERIZATION  11/2010  . CATARACT EXTRACTION, BILATERAL Bilateral   . CHOLECYSTECTOMY    . DILATION AND CURETTAGE OF  UTERUS    . INCISION AND DRAINAGE Left 03/12/2014   Procedure: LEFT INCISION/DRAINAGE DEEP ABSCESS/BURSA/HEMATOMA THIGH/KNEE REGION;  Surgeon: [name], MD;  Location: [facility] OR;  Service: Orthopedics;  Laterality: Left;  . KNEE ARTHROSCOPY Left   . ORIF DISTAL FEMUR FRACTURE Left 01/24/2014  . ORIF FEMUR FRACTURE Left 01/24/2014   Procedure: OPEN REDUCTION INTERNAL FIXATION (ORIF) DISTAL FEMUR FRACTURE;  Surgeon: [name], MD;  Location: [facility] OR;  Service: Orthopedics;  Laterality: Left;  . TIBIA FRACTURE SURGERY  Left ~ 1959  . TOTAL KNEE ARTHROPLASTY Left 2000  . VESICOVAGINAL FISTULA CLOSURE W/ TAH      Family Psychiatric History: Please see initial evaluation for full details. I have reviewed the history. No updates at this time.     Family History:  Family History  Problem Relation Age of Onset  . Coronary artery disease Other     Social History:  Social History   Socioeconomic History  . Marital status: Married    Spouse name: Not on file  . Number of children: Not on file  . Years of education: Not on file  . Highest education level: Not on file  Occupational History  . Not on file  Tobacco Use  . Smoking status: Former Smoker    Packs/day: 0.50    Years: 58.00    Pack years: 29.00    Types: Cigarettes    Quit date: 07/26/2010    Years since quitting: 9.6  . Smokeless tobacco: Never Used  Substance and Sexual Activity  . Alcohol use: No    Alcohol/week: 0.0 standard drinks  . Drug use: No  . Sexual activity: Never  Other Topics Concern  . Not on file  Social History Narrative  . Not on file   Social Determinants of Health   Financial Resource Strain:   . Difficulty of Paying Living Expenses:   Food Insecurity:   . Worried About Programme researcher, broadcasting/film/video in the Last Year:   . Barista in the Last Year:   Transportation Needs:   . Freight forwarder (Medical):   Marland Kitchen Lack of Transportation (Non-Medical):   Physical Activity:   . Days of Exercise per Week:   . Minutes of Exercise per Session:   Stress:   . Feeling of Stress :   Social Connections:   . Frequency of Communication with Friends and Family:   . Frequency of Social Gatherings with Friends and Family:   . Attends Religious Services:   . Active Member of Clubs or Organizations:   . Attends Banker Meetings:   Marland Kitchen Marital Status:     Allergies:  Allergies  Allergen Reactions  . Azithromycin     Per MAR  . Penicillins     Tolerated Rocephin (July 2015)  . Tetracycline     Per  MAR    Metabolic Disorder Labs: No results found for: HGBA1C, MPG No results found for: PROLACTIN Lab Results  Component Value Date   CHOL  09/06/2008    164        ATP III CLASSIFICATION:  <200     mg/dL   Desirable  179-150  mg/dL   Borderline High  >=569    mg/dL   High          TRIG 86 09/06/2008   HDL 49 09/06/2008   CHOLHDL 3.3 09/06/2008   VLDL 17 09/06/2008   LDLCALC  09/06/2008    98  Total Cholesterol/HDL:CHD Risk Coronary Heart Disease Risk Table                     Men   Women  1/2 Average Risk   3.4   3.3  Average Risk       5.0   4.4  2 X Average Risk   9.6   7.1  3 X Average Risk  23.4   11.0        Use the calculated Patient Ratio above and the CHD Risk Table to determine the patient's CHD Risk.        ATP III CLASSIFICATION (LDL):  <100     mg/dL   Optimal  160-109  mg/dL   Near or Above                    Optimal  130-159  mg/dL   Borderline  323-557  mg/dL   High  >322     mg/dL   Very High   Lab Results  Component Value Date   TSH 0.71 01/09/2020    Therapeutic Level Labs: No results found for: LITHIUM No results found for: VALPROATE No components found for:  CBMZ  Current Medications: Current Outpatient Medications  Medication Sig Dispense Refill  . albuterol (PROVENTIL HFA;VENTOLIN HFA) 108 (90 BASE) MCG/ACT inhaler Inhale 2 puffs into the lungs every 6 (six) hours as needed for wheezing or shortness of breath.    Marland Kitchen amLODipine (NORVASC) 5 MG tablet Take 5 mg by mouth daily.    Marland Kitchen atorvastatin (LIPITOR) 10 MG tablet Take 1 tablet (10 mg total) by mouth at bedtime. 90 tablet 3  . busPIRone (BUSPAR) 10 MG tablet Take 1 tablet (10 mg total) by mouth 3 (three) times daily as needed (anxiety). 84 tablet 3  . escitalopram (LEXAPRO) 10 MG tablet Take 1 tablet (10 mg total) by mouth daily. 28 tablet 3  . furosemide (LASIX) 40 MG tablet Take 40 mg by mouth as needed.     Marland Kitchen glimepiride (AMARYL) 2 MG tablet Take 1 tablet by mouth 2 (two)  times daily.    Marland Kitchen HYDROcodone-acetaminophen (NORCO/VICODIN) 5-325 MG per tablet Take 1 tablet by mouth every 4 (four) hours as needed for moderate pain. (Patient taking differently: Take 1 tablet by mouth 2 (two) times daily. ) 30 tablet 0  . ipratropium-albuterol (DUONEB) 0.5-2.5 (3) MG/3ML SOLN Take 3 mLs by nebulization 4 (four) times daily as needed. Shortness of breath    . JANUVIA 50 MG tablet Take 1 tablet by mouth daily.    Marland Kitchen lamoTRIgine (LAMICTAL) 100 MG tablet Take 1 tablet (100 mg total) by mouth at bedtime. 28 tablet 3  . memantine (NAMENDA XR) 14 MG CP24 24 hr capsule Take 14 mg by mouth daily.    . metoprolol tartrate (LOPRESSOR) 25 MG tablet Take 0.5 tablets (12.5 mg total) by mouth 2 (two) times daily. 90 tablet 3  . mirtazapine (REMERON) 30 MG tablet Take 1 tablet (30 mg total) by mouth at bedtime. 28 tablet 3  . VITAMIN D, CHOLECALCIFEROL, PO Take 1 tablet by mouth daily.     No current facility-administered medications for this visit.     Musculoskeletal: Strength & Muscle Tone: N/A Gait & Station: N/A Patient leans: N/A  Psychiatric Specialty Exam: Review of Systems  Psychiatric/Behavioral: Negative for agitation.  All other systems reviewed and are negative.   There were no vitals taken for this visit.There is no height or weight  on file to calculate BMI.  General Appearance: NA  Eye Contact:  NA  Speech:  Clear and Coherent  Volume:  Normal  Mood:  Anxious  Affect:  NA  Thought Process:  Coherent  Orientation:  Full (Time, Place, and Person)  Thought Content: Logical   Suicidal Thoughts:  No  Homicidal Thoughts:  No  Memory:  Immediate;   Good  Judgement:  Fair  Insight:  Fair  Psychomotor Activity:  Normal  Concentration:  Concentration: Good and Attention Span: Good  Recall:  Good  Fund of Knowledge: Good  Language: Good  Akathisia:  No  Handed:  Right  AIMS (if indicated): not done  Assets:  Communication Skills Desire for Improvement  ADL's:   Intact  Cognition: WNL  Sleep:  Fair   Screenings: PHQ2-9     Office Visit from 04/09/2014 in [facility] for Infectious Disease  PHQ-2 Total Score 0       Assessment and Plan:  Brenda Mcdonald is a 79 y.o. year old female with a history of bipolar II disorder, major neurocognitive disorder, hypertension,CAD,COPD, TIA, type II DM , who presents for follow up appointment for below.   1. MDD (major depressive disorder), recurrent episode, mild (HCC) There has been more improvement in depressive symptoms despite no medication change since the last visit.  Will continue current dose of medication.  Will continue Lexapro to target depression.  We will continue mirtazapine as adjunctive treatment for depression.  Will continue lamotrigine for mood dysregulation.  Discussed potential risk of Stevens-Johnson syndrome.  We will continue BuSpar for anxiety.  Will continue quetiapine as adjunctive treatment for depression.  Discussed potential metabolic side effect and EPS.  Noted that she has strong preference to stay on her current medication/even uptitration of the dose and she did have worsening in mood symptoms, which coincided with tapering down lamotrigine/other psychotropics. Will plan to stay on the same regimen at this time although it is preferable to avoid polypharmacy. She is not interested in therapy appointment unless it is in person visit.   #Major neurocognitive disorder She does have acognitive deficits as characterized by MOCA. She is on memantine, prescribed by PCP. Will continue to monitor.  Plan I have reviewed and updated plans as below 1. Continue lexapro 10 mg daily 2.Continuemirtazapine 30mg  at night 3.Continuelamotrigine100mg  daily 4.Continuequetiapine 50mg  night 5. ContinueBuspar10 mg three times a day as needed for anxiety  6.Next appointment- 11/9 at 11 AM for 30 mins, phone 7.(She is on memantine 14 mg daily)   The patient  demonstrates the following risk factors for suicide: Chronic risk factors for suicide include: psychiatric disorder of bipolar disorder. Acute risk factorsfor suicide include: unemployment and social withdrawal/isolation. Protective factorsfor this patient include: positive social support, coping skills. Considering these factors, the overall suicide risk at this point appears to be low. Patient isappropriate for outpatient follow up.  Neysa Hotter, MD 03/11/2020, 8:52 AM

## 2020-03-11 ENCOUNTER — Other Ambulatory Visit: Payer: Self-pay

## 2020-03-11 ENCOUNTER — Encounter (HOSPITAL_COMMUNITY): Payer: Self-pay | Admitting: Psychiatry

## 2020-03-11 ENCOUNTER — Telehealth (INDEPENDENT_AMBULATORY_CARE_PROVIDER_SITE_OTHER): Payer: Medicare Other | Admitting: Psychiatry

## 2020-03-11 DIAGNOSIS — Z23 Encounter for immunization: Secondary | ICD-10-CM | POA: Diagnosis not present

## 2020-03-11 DIAGNOSIS — F330 Major depressive disorder, recurrent, mild: Secondary | ICD-10-CM

## 2020-03-11 MED ORDER — ESCITALOPRAM OXALATE 10 MG PO TABS: 10.0000 mg | ORAL_TABLET | Freq: Every day | ORAL | 3 refills | Status: DC

## 2020-03-11 MED ORDER — MIRTAZAPINE 30 MG PO TABS: 30.0000 mg | ORAL_TABLET | Freq: Every day | ORAL | 3 refills | Status: DC

## 2020-03-11 MED ORDER — BUSPIRONE HCL 10 MG PO TABS: 10.0000 mg | ORAL_TABLET | Freq: Three times a day (TID) | ORAL | 3 refills | Status: DC | PRN

## 2020-03-11 MED ORDER — LAMOTRIGINE 100 MG PO TABS: 100.0000 mg | ORAL_TABLET | Freq: Every day | ORAL | 3 refills | Status: DC

## 2020-03-12 DIAGNOSIS — R4182 Altered mental status, unspecified: Secondary | ICD-10-CM | POA: Diagnosis not present

## 2020-03-12 DIAGNOSIS — E119 Type 2 diabetes mellitus without complications: Secondary | ICD-10-CM | POA: Diagnosis not present

## 2020-03-12 DIAGNOSIS — R4020 Unspecified coma: Secondary | ICD-10-CM | POA: Diagnosis not present

## 2020-03-12 DIAGNOSIS — Z20822 Contact with and (suspected) exposure to covid-19: Secondary | ICD-10-CM | POA: Diagnosis not present

## 2020-03-12 DIAGNOSIS — J9600 Acute respiratory failure, unspecified whether with hypoxia or hypercapnia: Secondary | ICD-10-CM | POA: Diagnosis not present

## 2020-03-12 DIAGNOSIS — A419 Sepsis, unspecified organism: Secondary | ICD-10-CM | POA: Diagnosis not present

## 2020-03-12 DIAGNOSIS — I509 Heart failure, unspecified: Secondary | ICD-10-CM | POA: Diagnosis not present

## 2020-03-12 DIAGNOSIS — G9340 Encephalopathy, unspecified: Secondary | ICD-10-CM | POA: Diagnosis not present

## 2020-03-12 DIAGNOSIS — R9082 White matter disease, unspecified: Secondary | ICD-10-CM | POA: Diagnosis not present

## 2020-03-12 DIAGNOSIS — J811 Chronic pulmonary edema: Secondary | ICD-10-CM | POA: Diagnosis not present

## 2020-03-12 DIAGNOSIS — J440 Chronic obstructive pulmonary disease with acute lower respiratory infection: Secondary | ICD-10-CM | POA: Diagnosis not present

## 2020-03-12 DIAGNOSIS — J9690 Respiratory failure, unspecified, unspecified whether with hypoxia or hypercapnia: Secondary | ICD-10-CM | POA: Diagnosis not present

## 2020-03-12 DIAGNOSIS — J810 Acute pulmonary edema: Secondary | ICD-10-CM | POA: Diagnosis not present

## 2020-03-12 DIAGNOSIS — J80 Acute respiratory distress syndrome: Secondary | ICD-10-CM | POA: Diagnosis not present

## 2020-03-12 DIAGNOSIS — I5033 Acute on chronic diastolic (congestive) heart failure: Secondary | ICD-10-CM | POA: Diagnosis not present

## 2020-03-12 DIAGNOSIS — J90 Pleural effusion, not elsewhere classified: Secondary | ICD-10-CM | POA: Diagnosis not present

## 2020-03-12 DIAGNOSIS — R0902 Hypoxemia: Secondary | ICD-10-CM | POA: Diagnosis not present

## 2020-03-12 DIAGNOSIS — J189 Pneumonia, unspecified organism: Secondary | ICD-10-CM | POA: Diagnosis not present

## 2020-03-12 DIAGNOSIS — R0602 Shortness of breath: Secondary | ICD-10-CM | POA: Diagnosis not present

## 2020-03-12 DIAGNOSIS — E1165 Type 2 diabetes mellitus with hyperglycemia: Secondary | ICD-10-CM | POA: Diagnosis not present

## 2020-03-13 DIAGNOSIS — F0280 Dementia in other diseases classified elsewhere without behavioral disturbance: Secondary | ICD-10-CM | POA: Diagnosis not present

## 2020-03-13 DIAGNOSIS — R41841 Cognitive communication deficit: Secondary | ICD-10-CM | POA: Diagnosis not present

## 2020-03-13 DIAGNOSIS — R1311 Dysphagia, oral phase: Secondary | ICD-10-CM | POA: Diagnosis not present

## 2020-03-13 DIAGNOSIS — E785 Hyperlipidemia, unspecified: Secondary | ICD-10-CM | POA: Diagnosis present

## 2020-03-13 DIAGNOSIS — I5033 Acute on chronic diastolic (congestive) heart failure: Secondary | ICD-10-CM | POA: Diagnosis present

## 2020-03-13 DIAGNOSIS — J810 Acute pulmonary edema: Secondary | ICD-10-CM | POA: Diagnosis not present

## 2020-03-13 DIAGNOSIS — Z7401 Bed confinement status: Secondary | ICD-10-CM | POA: Diagnosis not present

## 2020-03-13 DIAGNOSIS — H9190 Unspecified hearing loss, unspecified ear: Secondary | ICD-10-CM | POA: Diagnosis present

## 2020-03-13 DIAGNOSIS — J189 Pneumonia, unspecified organism: Secondary | ICD-10-CM | POA: Diagnosis present

## 2020-03-13 DIAGNOSIS — F319 Bipolar disorder, unspecified: Secondary | ICD-10-CM | POA: Diagnosis not present

## 2020-03-13 DIAGNOSIS — I110 Hypertensive heart disease with heart failure: Secondary | ICD-10-CM | POA: Diagnosis not present

## 2020-03-13 DIAGNOSIS — I509 Heart failure, unspecified: Secondary | ICD-10-CM | POA: Diagnosis not present

## 2020-03-13 DIAGNOSIS — R2689 Other abnormalities of gait and mobility: Secondary | ICD-10-CM | POA: Diagnosis not present

## 2020-03-13 DIAGNOSIS — R0902 Hypoxemia: Secondary | ICD-10-CM | POA: Diagnosis not present

## 2020-03-13 DIAGNOSIS — F339 Major depressive disorder, recurrent, unspecified: Secondary | ICD-10-CM | POA: Diagnosis not present

## 2020-03-13 DIAGNOSIS — J90 Pleural effusion, not elsewhere classified: Secondary | ICD-10-CM | POA: Diagnosis not present

## 2020-03-13 DIAGNOSIS — N3281 Overactive bladder: Secondary | ICD-10-CM | POA: Diagnosis not present

## 2020-03-13 DIAGNOSIS — I959 Hypotension, unspecified: Secondary | ICD-10-CM | POA: Diagnosis not present

## 2020-03-13 DIAGNOSIS — J440 Chronic obstructive pulmonary disease with acute lower respiratory infection: Secondary | ICD-10-CM | POA: Diagnosis present

## 2020-03-13 DIAGNOSIS — J9600 Acute respiratory failure, unspecified whether with hypoxia or hypercapnia: Secondary | ICD-10-CM | POA: Diagnosis present

## 2020-03-13 DIAGNOSIS — F29 Unspecified psychosis not due to a substance or known physiological condition: Secondary | ICD-10-CM | POA: Diagnosis not present

## 2020-03-13 DIAGNOSIS — R4182 Altered mental status, unspecified: Secondary | ICD-10-CM | POA: Diagnosis not present

## 2020-03-13 DIAGNOSIS — F329 Major depressive disorder, single episode, unspecified: Secondary | ICD-10-CM | POA: Diagnosis present

## 2020-03-13 DIAGNOSIS — K219 Gastro-esophageal reflux disease without esophagitis: Secondary | ICD-10-CM | POA: Diagnosis not present

## 2020-03-13 DIAGNOSIS — M6281 Muscle weakness (generalized): Secondary | ICD-10-CM | POA: Diagnosis not present

## 2020-03-13 DIAGNOSIS — A419 Sepsis, unspecified organism: Secondary | ICD-10-CM | POA: Diagnosis present

## 2020-03-13 DIAGNOSIS — J9690 Respiratory failure, unspecified, unspecified whether with hypoxia or hypercapnia: Secondary | ICD-10-CM | POA: Diagnosis not present

## 2020-03-13 DIAGNOSIS — Z66 Do not resuscitate: Secondary | ICD-10-CM | POA: Diagnosis present

## 2020-03-13 DIAGNOSIS — J449 Chronic obstructive pulmonary disease, unspecified: Secondary | ICD-10-CM | POA: Diagnosis not present

## 2020-03-13 DIAGNOSIS — R21 Rash and other nonspecific skin eruption: Secondary | ICD-10-CM | POA: Diagnosis not present

## 2020-03-13 DIAGNOSIS — E119 Type 2 diabetes mellitus without complications: Secondary | ICD-10-CM | POA: Diagnosis present

## 2020-03-13 DIAGNOSIS — Z880 Allergy status to penicillin: Secondary | ICD-10-CM | POA: Diagnosis not present

## 2020-03-13 DIAGNOSIS — Z20822 Contact with and (suspected) exposure to covid-19: Secondary | ICD-10-CM | POA: Diagnosis present

## 2020-03-19 DIAGNOSIS — J9690 Respiratory failure, unspecified, unspecified whether with hypoxia or hypercapnia: Secondary | ICD-10-CM | POA: Diagnosis not present

## 2020-03-19 DIAGNOSIS — R6520 Severe sepsis without septic shock: Secondary | ICD-10-CM | POA: Diagnosis present

## 2020-03-19 DIAGNOSIS — R41841 Cognitive communication deficit: Secondary | ICD-10-CM | POA: Diagnosis not present

## 2020-03-19 DIAGNOSIS — J189 Pneumonia, unspecified organism: Secondary | ICD-10-CM | POA: Diagnosis not present

## 2020-03-19 DIAGNOSIS — Z880 Allergy status to penicillin: Secondary | ICD-10-CM | POA: Diagnosis not present

## 2020-03-19 DIAGNOSIS — R5383 Other fatigue: Secondary | ICD-10-CM | POA: Diagnosis present

## 2020-03-19 DIAGNOSIS — Z7401 Bed confinement status: Secondary | ICD-10-CM | POA: Diagnosis not present

## 2020-03-19 DIAGNOSIS — R2689 Other abnormalities of gait and mobility: Secondary | ICD-10-CM | POA: Diagnosis not present

## 2020-03-19 DIAGNOSIS — F3130 Bipolar disorder, current episode depressed, mild or moderate severity, unspecified: Secondary | ICD-10-CM | POA: Diagnosis not present

## 2020-03-19 DIAGNOSIS — F0280 Dementia in other diseases classified elsewhere without behavioral disturbance: Secondary | ICD-10-CM | POA: Diagnosis not present

## 2020-03-19 DIAGNOSIS — E119 Type 2 diabetes mellitus without complications: Secondary | ICD-10-CM | POA: Diagnosis present

## 2020-03-19 DIAGNOSIS — J90 Pleural effusion, not elsewhere classified: Secondary | ICD-10-CM | POA: Diagnosis not present

## 2020-03-19 DIAGNOSIS — I509 Heart failure, unspecified: Secondary | ICD-10-CM | POA: Diagnosis not present

## 2020-03-19 DIAGNOSIS — A419 Sepsis, unspecified organism: Secondary | ICD-10-CM | POA: Diagnosis not present

## 2020-03-19 DIAGNOSIS — Z7984 Long term (current) use of oral hypoglycemic drugs: Secondary | ICD-10-CM | POA: Diagnosis not present

## 2020-03-19 DIAGNOSIS — K219 Gastro-esophageal reflux disease without esophagitis: Secondary | ICD-10-CM | POA: Diagnosis not present

## 2020-03-19 DIAGNOSIS — Z87891 Personal history of nicotine dependence: Secondary | ICD-10-CM | POA: Diagnosis not present

## 2020-03-19 DIAGNOSIS — R0602 Shortness of breath: Secondary | ICD-10-CM | POA: Diagnosis not present

## 2020-03-19 DIAGNOSIS — E785 Hyperlipidemia, unspecified: Secondary | ICD-10-CM | POA: Diagnosis present

## 2020-03-19 DIAGNOSIS — J440 Chronic obstructive pulmonary disease with acute lower respiratory infection: Secondary | ICD-10-CM | POA: Diagnosis present

## 2020-03-19 DIAGNOSIS — F319 Bipolar disorder, unspecified: Secondary | ICD-10-CM | POA: Diagnosis present

## 2020-03-19 DIAGNOSIS — J159 Unspecified bacterial pneumonia: Secondary | ICD-10-CM | POA: Diagnosis present

## 2020-03-19 DIAGNOSIS — R0689 Other abnormalities of breathing: Secondary | ICD-10-CM | POA: Diagnosis not present

## 2020-03-19 DIAGNOSIS — R069 Unspecified abnormalities of breathing: Secondary | ICD-10-CM | POA: Diagnosis not present

## 2020-03-19 DIAGNOSIS — R404 Transient alteration of awareness: Secondary | ICD-10-CM | POA: Diagnosis not present

## 2020-03-19 DIAGNOSIS — R1311 Dysphagia, oral phase: Secondary | ICD-10-CM | POA: Diagnosis not present

## 2020-03-19 DIAGNOSIS — Z66 Do not resuscitate: Secondary | ICD-10-CM | POA: Diagnosis present

## 2020-03-19 DIAGNOSIS — J449 Chronic obstructive pulmonary disease, unspecified: Secondary | ICD-10-CM | POA: Diagnosis not present

## 2020-03-19 DIAGNOSIS — R4182 Altered mental status, unspecified: Secondary | ICD-10-CM | POA: Diagnosis not present

## 2020-03-19 DIAGNOSIS — I110 Hypertensive heart disease with heart failure: Secondary | ICD-10-CM | POA: Diagnosis not present

## 2020-03-19 DIAGNOSIS — J810 Acute pulmonary edema: Secondary | ICD-10-CM | POA: Diagnosis not present

## 2020-03-19 DIAGNOSIS — Z7951 Long term (current) use of inhaled steroids: Secondary | ICD-10-CM | POA: Diagnosis not present

## 2020-03-19 DIAGNOSIS — D692 Other nonthrombocytopenic purpura: Secondary | ICD-10-CM | POA: Diagnosis not present

## 2020-03-19 DIAGNOSIS — Z20822 Contact with and (suspected) exposure to covid-19: Secondary | ICD-10-CM | POA: Diagnosis not present

## 2020-03-19 DIAGNOSIS — I959 Hypotension, unspecified: Secondary | ICD-10-CM | POA: Diagnosis not present

## 2020-03-19 DIAGNOSIS — F29 Unspecified psychosis not due to a substance or known physiological condition: Secondary | ICD-10-CM | POA: Diagnosis not present

## 2020-03-19 DIAGNOSIS — N3281 Overactive bladder: Secondary | ICD-10-CM | POA: Diagnosis not present

## 2020-03-19 DIAGNOSIS — R0902 Hypoxemia: Secondary | ICD-10-CM | POA: Diagnosis not present

## 2020-03-19 DIAGNOSIS — J9600 Acute respiratory failure, unspecified whether with hypoxia or hypercapnia: Secondary | ICD-10-CM | POA: Diagnosis not present

## 2020-03-19 DIAGNOSIS — F339 Major depressive disorder, recurrent, unspecified: Secondary | ICD-10-CM | POA: Diagnosis not present

## 2020-03-19 DIAGNOSIS — Z299 Encounter for prophylactic measures, unspecified: Secondary | ICD-10-CM | POA: Diagnosis not present

## 2020-03-19 DIAGNOSIS — M6281 Muscle weakness (generalized): Secondary | ICD-10-CM | POA: Diagnosis not present

## 2020-03-24 ENCOUNTER — Other Ambulatory Visit: Payer: Self-pay | Admitting: *Deleted

## 2020-03-24 NOTE — Patient Outreach (Signed)
Member screened for potential [facility] Care Management needs as a benefit of [employer].  Per Patient Brenda Mcdonald member resides in [facility].   Communication sent to [facility] SW to collaborate about anticipated dc plans and potential [facility] Care Management needs.   Raiford Noble, MSN-Ed, RN,BSN [facility] Psychiatric Post Acute Care Coordinator [phone] [facility]) [phone]  (Toll free office)

## 2020-03-24 NOTE — Patient Outreach (Signed)
[facility] Post- Acute Care Coordinator follow up.   Update received from [facility] social worker indicating member is from home with spouse. She has a CAPs aide and need assist with ADLs. She also has a ramp at home.   Facility has a scheduled care plan meeting this Wednesday.  Will continue to follow for transition plans and potential [facility] Care Management services.    Raiford Noble, MSN-Ed, RN,BSN [facility] Post Acute Care Coordinator [phone] [facility]) [phone]  (Toll free office)

## 2020-03-25 DIAGNOSIS — D692 Other nonthrombocytopenic purpura: Secondary | ICD-10-CM | POA: Diagnosis not present

## 2020-03-25 DIAGNOSIS — F3130 Bipolar disorder, current episode depressed, mild or moderate severity, unspecified: Secondary | ICD-10-CM | POA: Diagnosis not present

## 2020-03-25 DIAGNOSIS — Z299 Encounter for prophylactic measures, unspecified: Secondary | ICD-10-CM | POA: Diagnosis not present

## 2020-03-25 DIAGNOSIS — F319 Bipolar disorder, unspecified: Secondary | ICD-10-CM | POA: Diagnosis not present

## 2020-03-30 DIAGNOSIS — Z66 Do not resuscitate: Secondary | ICD-10-CM | POA: Diagnosis present

## 2020-03-30 DIAGNOSIS — F29 Unspecified psychosis not due to a substance or known physiological condition: Secondary | ICD-10-CM | POA: Diagnosis not present

## 2020-03-30 DIAGNOSIS — E119 Type 2 diabetes mellitus without complications: Secondary | ICD-10-CM | POA: Diagnosis not present

## 2020-03-30 DIAGNOSIS — F339 Major depressive disorder, recurrent, unspecified: Secondary | ICD-10-CM | POA: Diagnosis not present

## 2020-03-30 DIAGNOSIS — R069 Unspecified abnormalities of breathing: Secondary | ICD-10-CM | POA: Diagnosis not present

## 2020-03-30 DIAGNOSIS — A419 Sepsis, unspecified organism: Secondary | ICD-10-CM | POA: Diagnosis not present

## 2020-03-30 DIAGNOSIS — F319 Bipolar disorder, unspecified: Secondary | ICD-10-CM | POA: Diagnosis present

## 2020-03-30 DIAGNOSIS — R5383 Other fatigue: Secondary | ICD-10-CM | POA: Diagnosis present

## 2020-03-30 DIAGNOSIS — R0602 Shortness of breath: Secondary | ICD-10-CM | POA: Diagnosis not present

## 2020-03-30 DIAGNOSIS — K219 Gastro-esophageal reflux disease without esophagitis: Secondary | ICD-10-CM | POA: Diagnosis not present

## 2020-03-30 DIAGNOSIS — R2689 Other abnormalities of gait and mobility: Secondary | ICD-10-CM | POA: Diagnosis not present

## 2020-03-30 DIAGNOSIS — R41841 Cognitive communication deficit: Secondary | ICD-10-CM | POA: Diagnosis not present

## 2020-03-30 DIAGNOSIS — J189 Pneumonia, unspecified organism: Secondary | ICD-10-CM | POA: Diagnosis not present

## 2020-03-30 DIAGNOSIS — J449 Chronic obstructive pulmonary disease, unspecified: Secondary | ICD-10-CM | POA: Diagnosis not present

## 2020-03-30 DIAGNOSIS — R0902 Hypoxemia: Secondary | ICD-10-CM | POA: Diagnosis not present

## 2020-03-30 DIAGNOSIS — I110 Hypertensive heart disease with heart failure: Secondary | ICD-10-CM | POA: Diagnosis not present

## 2020-03-30 DIAGNOSIS — R05 Cough: Secondary | ICD-10-CM | POA: Diagnosis not present

## 2020-03-30 DIAGNOSIS — J159 Unspecified bacterial pneumonia: Secondary | ICD-10-CM | POA: Diagnosis present

## 2020-03-30 DIAGNOSIS — R0689 Other abnormalities of breathing: Secondary | ICD-10-CM | POA: Diagnosis not present

## 2020-03-30 DIAGNOSIS — J90 Pleural effusion, not elsewhere classified: Secondary | ICD-10-CM | POA: Diagnosis not present

## 2020-03-30 DIAGNOSIS — J9600 Acute respiratory failure, unspecified whether with hypoxia or hypercapnia: Secondary | ICD-10-CM | POA: Diagnosis present

## 2020-03-30 DIAGNOSIS — R6520 Severe sepsis without septic shock: Secondary | ICD-10-CM | POA: Diagnosis not present

## 2020-03-30 DIAGNOSIS — I509 Heart failure, unspecified: Secondary | ICD-10-CM | POA: Diagnosis not present

## 2020-03-30 DIAGNOSIS — Z880 Allergy status to penicillin: Secondary | ICD-10-CM | POA: Diagnosis not present

## 2020-03-30 DIAGNOSIS — M6281 Muscle weakness (generalized): Secondary | ICD-10-CM | POA: Diagnosis not present

## 2020-03-30 DIAGNOSIS — Z7984 Long term (current) use of oral hypoglycemic drugs: Secondary | ICD-10-CM | POA: Diagnosis not present

## 2020-03-30 DIAGNOSIS — J440 Chronic obstructive pulmonary disease with acute lower respiratory infection: Secondary | ICD-10-CM | POA: Diagnosis present

## 2020-03-30 DIAGNOSIS — Z87891 Personal history of nicotine dependence: Secondary | ICD-10-CM | POA: Diagnosis not present

## 2020-03-30 DIAGNOSIS — Z20822 Contact with and (suspected) exposure to covid-19: Secondary | ICD-10-CM | POA: Diagnosis not present

## 2020-03-30 DIAGNOSIS — Z7401 Bed confinement status: Secondary | ICD-10-CM | POA: Diagnosis not present

## 2020-03-30 DIAGNOSIS — N3281 Overactive bladder: Secondary | ICD-10-CM | POA: Diagnosis not present

## 2020-03-30 DIAGNOSIS — Z7951 Long term (current) use of inhaled steroids: Secondary | ICD-10-CM | POA: Diagnosis not present

## 2020-03-30 DIAGNOSIS — I10 Essential (primary) hypertension: Secondary | ICD-10-CM | POA: Diagnosis not present

## 2020-03-30 DIAGNOSIS — R1311 Dysphagia, oral phase: Secondary | ICD-10-CM | POA: Diagnosis not present

## 2020-03-30 DIAGNOSIS — E785 Hyperlipidemia, unspecified: Secondary | ICD-10-CM | POA: Diagnosis present

## 2020-03-30 DIAGNOSIS — F0280 Dementia in other diseases classified elsewhere without behavioral disturbance: Secondary | ICD-10-CM | POA: Diagnosis not present

## 2020-03-30 DIAGNOSIS — R404 Transient alteration of awareness: Secondary | ICD-10-CM | POA: Diagnosis not present

## 2020-04-07 DIAGNOSIS — M549 Dorsalgia, unspecified: Secondary | ICD-10-CM | POA: Diagnosis not present

## 2020-04-07 DIAGNOSIS — E785 Hyperlipidemia, unspecified: Secondary | ICD-10-CM | POA: Diagnosis not present

## 2020-04-07 DIAGNOSIS — F0280 Dementia in other diseases classified elsewhere without behavioral disturbance: Secondary | ICD-10-CM | POA: Diagnosis not present

## 2020-04-07 DIAGNOSIS — K219 Gastro-esophageal reflux disease without esophagitis: Secondary | ICD-10-CM | POA: Diagnosis not present

## 2020-04-07 DIAGNOSIS — F319 Bipolar disorder, unspecified: Secondary | ICD-10-CM | POA: Diagnosis not present

## 2020-04-07 DIAGNOSIS — Z299 Encounter for prophylactic measures, unspecified: Secondary | ICD-10-CM | POA: Diagnosis not present

## 2020-04-07 DIAGNOSIS — R1311 Dysphagia, oral phase: Secondary | ICD-10-CM | POA: Diagnosis not present

## 2020-04-07 DIAGNOSIS — I110 Hypertensive heart disease with heart failure: Secondary | ICD-10-CM | POA: Diagnosis not present

## 2020-04-07 DIAGNOSIS — Z7401 Bed confinement status: Secondary | ICD-10-CM | POA: Diagnosis not present

## 2020-04-07 DIAGNOSIS — I509 Heart failure, unspecified: Secondary | ICD-10-CM | POA: Diagnosis not present

## 2020-04-07 DIAGNOSIS — I10 Essential (primary) hypertension: Secondary | ICD-10-CM | POA: Diagnosis not present

## 2020-04-07 DIAGNOSIS — M6281 Muscle weakness (generalized): Secondary | ICD-10-CM | POA: Diagnosis not present

## 2020-04-07 DIAGNOSIS — J9600 Acute respiratory failure, unspecified whether with hypoxia or hypercapnia: Secondary | ICD-10-CM | POA: Diagnosis not present

## 2020-04-07 DIAGNOSIS — J189 Pneumonia, unspecified organism: Secondary | ICD-10-CM | POA: Diagnosis not present

## 2020-04-07 DIAGNOSIS — R2689 Other abnormalities of gait and mobility: Secondary | ICD-10-CM | POA: Diagnosis not present

## 2020-04-07 DIAGNOSIS — F339 Major depressive disorder, recurrent, unspecified: Secondary | ICD-10-CM | POA: Diagnosis not present

## 2020-04-07 DIAGNOSIS — J449 Chronic obstructive pulmonary disease, unspecified: Secondary | ICD-10-CM | POA: Diagnosis not present

## 2020-04-07 DIAGNOSIS — R001 Bradycardia, unspecified: Secondary | ICD-10-CM | POA: Diagnosis not present

## 2020-04-07 DIAGNOSIS — F29 Unspecified psychosis not due to a substance or known physiological condition: Secondary | ICD-10-CM | POA: Diagnosis not present

## 2020-04-07 DIAGNOSIS — N3281 Overactive bladder: Secondary | ICD-10-CM | POA: Diagnosis not present

## 2020-04-07 DIAGNOSIS — Z23 Encounter for immunization: Secondary | ICD-10-CM | POA: Diagnosis not present

## 2020-04-07 DIAGNOSIS — F329 Major depressive disorder, single episode, unspecified: Secondary | ICD-10-CM | POA: Diagnosis not present

## 2020-04-07 DIAGNOSIS — R41841 Cognitive communication deficit: Secondary | ICD-10-CM | POA: Diagnosis not present

## 2020-04-07 DIAGNOSIS — E119 Type 2 diabetes mellitus without complications: Secondary | ICD-10-CM | POA: Diagnosis not present

## 2020-04-08 DIAGNOSIS — F319 Bipolar disorder, unspecified: Secondary | ICD-10-CM | POA: Diagnosis not present

## 2020-04-08 DIAGNOSIS — F329 Major depressive disorder, single episode, unspecified: Secondary | ICD-10-CM | POA: Diagnosis not present

## 2020-04-08 DIAGNOSIS — J189 Pneumonia, unspecified organism: Secondary | ICD-10-CM | POA: Diagnosis not present

## 2020-04-08 DIAGNOSIS — J449 Chronic obstructive pulmonary disease, unspecified: Secondary | ICD-10-CM | POA: Diagnosis not present

## 2020-04-24 DIAGNOSIS — J449 Chronic obstructive pulmonary disease, unspecified: Secondary | ICD-10-CM | POA: Diagnosis not present

## 2020-04-24 DIAGNOSIS — Z299 Encounter for prophylactic measures, unspecified: Secondary | ICD-10-CM | POA: Diagnosis not present

## 2020-04-24 DIAGNOSIS — R001 Bradycardia, unspecified: Secondary | ICD-10-CM | POA: Diagnosis not present

## 2020-04-24 DIAGNOSIS — F319 Bipolar disorder, unspecified: Secondary | ICD-10-CM | POA: Diagnosis not present

## 2020-04-24 DIAGNOSIS — M549 Dorsalgia, unspecified: Secondary | ICD-10-CM | POA: Diagnosis not present

## 2020-04-24 DIAGNOSIS — F329 Major depressive disorder, single episode, unspecified: Secondary | ICD-10-CM | POA: Diagnosis not present

## 2020-04-25 DIAGNOSIS — E119 Type 2 diabetes mellitus without complications: Secondary | ICD-10-CM | POA: Diagnosis not present

## 2020-04-25 DIAGNOSIS — Z87891 Personal history of nicotine dependence: Secondary | ICD-10-CM | POA: Diagnosis not present

## 2020-04-25 DIAGNOSIS — E785 Hyperlipidemia, unspecified: Secondary | ICD-10-CM | POA: Diagnosis not present

## 2020-04-25 DIAGNOSIS — I110 Hypertensive heart disease with heart failure: Secondary | ICD-10-CM | POA: Diagnosis not present

## 2020-04-25 DIAGNOSIS — M1990 Unspecified osteoarthritis, unspecified site: Secondary | ICD-10-CM | POA: Diagnosis not present

## 2020-04-25 DIAGNOSIS — E1165 Type 2 diabetes mellitus with hyperglycemia: Secondary | ICD-10-CM | POA: Diagnosis not present

## 2020-04-25 DIAGNOSIS — J441 Chronic obstructive pulmonary disease with (acute) exacerbation: Secondary | ICD-10-CM | POA: Diagnosis not present

## 2020-04-25 DIAGNOSIS — F319 Bipolar disorder, unspecified: Secondary | ICD-10-CM | POA: Diagnosis not present

## 2020-04-25 DIAGNOSIS — I509 Heart failure, unspecified: Secondary | ICD-10-CM | POA: Diagnosis not present

## 2020-04-25 DIAGNOSIS — Z8701 Personal history of pneumonia (recurrent): Secondary | ICD-10-CM | POA: Diagnosis not present

## 2020-04-25 DIAGNOSIS — Z7984 Long term (current) use of oral hypoglycemic drugs: Secondary | ICD-10-CM | POA: Diagnosis not present

## 2020-04-25 DIAGNOSIS — J9600 Acute respiratory failure, unspecified whether with hypoxia or hypercapnia: Secondary | ICD-10-CM | POA: Diagnosis not present

## 2020-04-29 DIAGNOSIS — I110 Hypertensive heart disease with heart failure: Secondary | ICD-10-CM | POA: Diagnosis not present

## 2020-04-29 DIAGNOSIS — J441 Chronic obstructive pulmonary disease with (acute) exacerbation: Secondary | ICD-10-CM | POA: Diagnosis not present

## 2020-04-29 DIAGNOSIS — I509 Heart failure, unspecified: Secondary | ICD-10-CM | POA: Diagnosis not present

## 2020-04-29 DIAGNOSIS — E1165 Type 2 diabetes mellitus with hyperglycemia: Secondary | ICD-10-CM | POA: Diagnosis not present

## 2020-04-29 DIAGNOSIS — J9600 Acute respiratory failure, unspecified whether with hypoxia or hypercapnia: Secondary | ICD-10-CM | POA: Diagnosis not present

## 2020-04-29 DIAGNOSIS — F319 Bipolar disorder, unspecified: Secondary | ICD-10-CM | POA: Diagnosis not present

## 2020-04-30 DIAGNOSIS — I5032 Chronic diastolic (congestive) heart failure: Secondary | ICD-10-CM | POA: Diagnosis not present

## 2020-04-30 DIAGNOSIS — E1165 Type 2 diabetes mellitus with hyperglycemia: Secondary | ICD-10-CM | POA: Diagnosis not present

## 2020-04-30 DIAGNOSIS — Z299 Encounter for prophylactic measures, unspecified: Secondary | ICD-10-CM | POA: Diagnosis not present

## 2020-04-30 DIAGNOSIS — J449 Chronic obstructive pulmonary disease, unspecified: Secondary | ICD-10-CM | POA: Diagnosis not present

## 2020-04-30 DIAGNOSIS — I10 Essential (primary) hypertension: Secondary | ICD-10-CM | POA: Diagnosis not present

## 2020-04-30 DIAGNOSIS — Z09 Encounter for follow-up examination after completed treatment for conditions other than malignant neoplasm: Secondary | ICD-10-CM | POA: Diagnosis not present

## 2020-05-01 DIAGNOSIS — E1165 Type 2 diabetes mellitus with hyperglycemia: Secondary | ICD-10-CM | POA: Diagnosis not present

## 2020-05-01 DIAGNOSIS — J441 Chronic obstructive pulmonary disease with (acute) exacerbation: Secondary | ICD-10-CM | POA: Diagnosis not present

## 2020-05-01 DIAGNOSIS — I509 Heart failure, unspecified: Secondary | ICD-10-CM | POA: Diagnosis not present

## 2020-05-01 DIAGNOSIS — I110 Hypertensive heart disease with heart failure: Secondary | ICD-10-CM | POA: Diagnosis not present

## 2020-05-01 DIAGNOSIS — J9600 Acute respiratory failure, unspecified whether with hypoxia or hypercapnia: Secondary | ICD-10-CM | POA: Diagnosis not present

## 2020-05-01 DIAGNOSIS — F319 Bipolar disorder, unspecified: Secondary | ICD-10-CM | POA: Diagnosis not present

## 2020-05-02 DIAGNOSIS — E1165 Type 2 diabetes mellitus with hyperglycemia: Secondary | ICD-10-CM | POA: Diagnosis not present

## 2020-05-02 DIAGNOSIS — F319 Bipolar disorder, unspecified: Secondary | ICD-10-CM | POA: Diagnosis not present

## 2020-05-02 DIAGNOSIS — I110 Hypertensive heart disease with heart failure: Secondary | ICD-10-CM | POA: Diagnosis not present

## 2020-05-02 DIAGNOSIS — J9600 Acute respiratory failure, unspecified whether with hypoxia or hypercapnia: Secondary | ICD-10-CM | POA: Diagnosis not present

## 2020-05-02 DIAGNOSIS — J441 Chronic obstructive pulmonary disease with (acute) exacerbation: Secondary | ICD-10-CM | POA: Diagnosis not present

## 2020-05-02 DIAGNOSIS — I509 Heart failure, unspecified: Secondary | ICD-10-CM | POA: Diagnosis not present

## 2020-05-05 DIAGNOSIS — F319 Bipolar disorder, unspecified: Secondary | ICD-10-CM | POA: Diagnosis not present

## 2020-05-05 DIAGNOSIS — I509 Heart failure, unspecified: Secondary | ICD-10-CM | POA: Diagnosis not present

## 2020-05-05 DIAGNOSIS — I110 Hypertensive heart disease with heart failure: Secondary | ICD-10-CM | POA: Diagnosis not present

## 2020-05-05 DIAGNOSIS — J9600 Acute respiratory failure, unspecified whether with hypoxia or hypercapnia: Secondary | ICD-10-CM | POA: Diagnosis not present

## 2020-05-05 DIAGNOSIS — J441 Chronic obstructive pulmonary disease with (acute) exacerbation: Secondary | ICD-10-CM | POA: Diagnosis not present

## 2020-05-05 DIAGNOSIS — E1165 Type 2 diabetes mellitus with hyperglycemia: Secondary | ICD-10-CM | POA: Diagnosis not present

## 2020-05-06 DIAGNOSIS — F319 Bipolar disorder, unspecified: Secondary | ICD-10-CM | POA: Diagnosis not present

## 2020-05-06 DIAGNOSIS — E1165 Type 2 diabetes mellitus with hyperglycemia: Secondary | ICD-10-CM | POA: Diagnosis not present

## 2020-05-06 DIAGNOSIS — J9600 Acute respiratory failure, unspecified whether with hypoxia or hypercapnia: Secondary | ICD-10-CM | POA: Diagnosis not present

## 2020-05-06 DIAGNOSIS — I110 Hypertensive heart disease with heart failure: Secondary | ICD-10-CM | POA: Diagnosis not present

## 2020-05-06 DIAGNOSIS — J441 Chronic obstructive pulmonary disease with (acute) exacerbation: Secondary | ICD-10-CM | POA: Diagnosis not present

## 2020-05-06 DIAGNOSIS — I509 Heart failure, unspecified: Secondary | ICD-10-CM | POA: Diagnosis not present

## 2020-05-08 DIAGNOSIS — J9600 Acute respiratory failure, unspecified whether with hypoxia or hypercapnia: Secondary | ICD-10-CM | POA: Diagnosis not present

## 2020-05-08 DIAGNOSIS — F319 Bipolar disorder, unspecified: Secondary | ICD-10-CM | POA: Diagnosis not present

## 2020-05-08 DIAGNOSIS — I509 Heart failure, unspecified: Secondary | ICD-10-CM | POA: Diagnosis not present

## 2020-05-08 DIAGNOSIS — E1165 Type 2 diabetes mellitus with hyperglycemia: Secondary | ICD-10-CM | POA: Diagnosis not present

## 2020-05-08 DIAGNOSIS — J441 Chronic obstructive pulmonary disease with (acute) exacerbation: Secondary | ICD-10-CM | POA: Diagnosis not present

## 2020-05-08 DIAGNOSIS — I110 Hypertensive heart disease with heart failure: Secondary | ICD-10-CM | POA: Diagnosis not present

## 2020-05-09 DIAGNOSIS — I509 Heart failure, unspecified: Secondary | ICD-10-CM | POA: Diagnosis not present

## 2020-05-09 DIAGNOSIS — J9600 Acute respiratory failure, unspecified whether with hypoxia or hypercapnia: Secondary | ICD-10-CM | POA: Diagnosis not present

## 2020-05-09 DIAGNOSIS — E1165 Type 2 diabetes mellitus with hyperglycemia: Secondary | ICD-10-CM | POA: Diagnosis not present

## 2020-05-09 DIAGNOSIS — I110 Hypertensive heart disease with heart failure: Secondary | ICD-10-CM | POA: Diagnosis not present

## 2020-05-09 DIAGNOSIS — F319 Bipolar disorder, unspecified: Secondary | ICD-10-CM | POA: Diagnosis not present

## 2020-05-09 DIAGNOSIS — J441 Chronic obstructive pulmonary disease with (acute) exacerbation: Secondary | ICD-10-CM | POA: Diagnosis not present

## 2020-05-13 DIAGNOSIS — I110 Hypertensive heart disease with heart failure: Secondary | ICD-10-CM | POA: Diagnosis not present

## 2020-05-13 DIAGNOSIS — J441 Chronic obstructive pulmonary disease with (acute) exacerbation: Secondary | ICD-10-CM | POA: Diagnosis not present

## 2020-05-13 DIAGNOSIS — E1165 Type 2 diabetes mellitus with hyperglycemia: Secondary | ICD-10-CM | POA: Diagnosis not present

## 2020-05-13 DIAGNOSIS — F319 Bipolar disorder, unspecified: Secondary | ICD-10-CM | POA: Diagnosis not present

## 2020-05-13 DIAGNOSIS — I509 Heart failure, unspecified: Secondary | ICD-10-CM | POA: Diagnosis not present

## 2020-05-13 DIAGNOSIS — J9600 Acute respiratory failure, unspecified whether with hypoxia or hypercapnia: Secondary | ICD-10-CM | POA: Diagnosis not present

## 2020-05-15 DIAGNOSIS — J441 Chronic obstructive pulmonary disease with (acute) exacerbation: Secondary | ICD-10-CM | POA: Diagnosis not present

## 2020-05-15 DIAGNOSIS — J9600 Acute respiratory failure, unspecified whether with hypoxia or hypercapnia: Secondary | ICD-10-CM | POA: Diagnosis not present

## 2020-05-15 DIAGNOSIS — I509 Heart failure, unspecified: Secondary | ICD-10-CM | POA: Diagnosis not present

## 2020-05-15 DIAGNOSIS — I110 Hypertensive heart disease with heart failure: Secondary | ICD-10-CM | POA: Diagnosis not present

## 2020-05-15 DIAGNOSIS — F319 Bipolar disorder, unspecified: Secondary | ICD-10-CM | POA: Diagnosis not present

## 2020-05-15 DIAGNOSIS — E1165 Type 2 diabetes mellitus with hyperglycemia: Secondary | ICD-10-CM | POA: Diagnosis not present

## 2020-05-16 DIAGNOSIS — F319 Bipolar disorder, unspecified: Secondary | ICD-10-CM | POA: Diagnosis not present

## 2020-05-16 DIAGNOSIS — I110 Hypertensive heart disease with heart failure: Secondary | ICD-10-CM | POA: Diagnosis not present

## 2020-05-16 DIAGNOSIS — I509 Heart failure, unspecified: Secondary | ICD-10-CM | POA: Diagnosis not present

## 2020-05-16 DIAGNOSIS — J9600 Acute respiratory failure, unspecified whether with hypoxia or hypercapnia: Secondary | ICD-10-CM | POA: Diagnosis not present

## 2020-05-16 DIAGNOSIS — J441 Chronic obstructive pulmonary disease with (acute) exacerbation: Secondary | ICD-10-CM | POA: Diagnosis not present

## 2020-05-16 DIAGNOSIS — E1165 Type 2 diabetes mellitus with hyperglycemia: Secondary | ICD-10-CM | POA: Diagnosis not present

## 2020-05-19 DIAGNOSIS — I509 Heart failure, unspecified: Secondary | ICD-10-CM | POA: Diagnosis not present

## 2020-05-19 DIAGNOSIS — J9600 Acute respiratory failure, unspecified whether with hypoxia or hypercapnia: Secondary | ICD-10-CM | POA: Diagnosis not present

## 2020-05-19 DIAGNOSIS — E1165 Type 2 diabetes mellitus with hyperglycemia: Secondary | ICD-10-CM | POA: Diagnosis not present

## 2020-05-19 DIAGNOSIS — F319 Bipolar disorder, unspecified: Secondary | ICD-10-CM | POA: Diagnosis not present

## 2020-05-19 DIAGNOSIS — I110 Hypertensive heart disease with heart failure: Secondary | ICD-10-CM | POA: Diagnosis not present

## 2020-05-19 DIAGNOSIS — J441 Chronic obstructive pulmonary disease with (acute) exacerbation: Secondary | ICD-10-CM | POA: Diagnosis not present

## 2020-05-20 DIAGNOSIS — Z6832 Body mass index (BMI) 32.0-32.9, adult: Secondary | ICD-10-CM | POA: Diagnosis not present

## 2020-05-20 DIAGNOSIS — N390 Urinary tract infection, site not specified: Secondary | ICD-10-CM | POA: Diagnosis not present

## 2020-05-20 DIAGNOSIS — F319 Bipolar disorder, unspecified: Secondary | ICD-10-CM | POA: Diagnosis not present

## 2020-05-20 DIAGNOSIS — R350 Frequency of micturition: Secondary | ICD-10-CM | POA: Diagnosis not present

## 2020-05-20 DIAGNOSIS — E1165 Type 2 diabetes mellitus with hyperglycemia: Secondary | ICD-10-CM | POA: Diagnosis not present

## 2020-05-20 DIAGNOSIS — I10 Essential (primary) hypertension: Secondary | ICD-10-CM | POA: Diagnosis not present

## 2020-05-20 DIAGNOSIS — Z299 Encounter for prophylactic measures, unspecified: Secondary | ICD-10-CM | POA: Diagnosis not present

## 2020-05-21 DIAGNOSIS — J9600 Acute respiratory failure, unspecified whether with hypoxia or hypercapnia: Secondary | ICD-10-CM | POA: Diagnosis not present

## 2020-05-21 DIAGNOSIS — J441 Chronic obstructive pulmonary disease with (acute) exacerbation: Secondary | ICD-10-CM | POA: Diagnosis not present

## 2020-05-21 DIAGNOSIS — E1165 Type 2 diabetes mellitus with hyperglycemia: Secondary | ICD-10-CM | POA: Diagnosis not present

## 2020-05-21 DIAGNOSIS — F319 Bipolar disorder, unspecified: Secondary | ICD-10-CM | POA: Diagnosis not present

## 2020-05-21 DIAGNOSIS — I509 Heart failure, unspecified: Secondary | ICD-10-CM | POA: Diagnosis not present

## 2020-05-21 DIAGNOSIS — I110 Hypertensive heart disease with heart failure: Secondary | ICD-10-CM | POA: Diagnosis not present

## 2020-05-23 DIAGNOSIS — E1165 Type 2 diabetes mellitus with hyperglycemia: Secondary | ICD-10-CM | POA: Diagnosis not present

## 2020-05-23 DIAGNOSIS — I110 Hypertensive heart disease with heart failure: Secondary | ICD-10-CM | POA: Diagnosis not present

## 2020-05-23 DIAGNOSIS — J441 Chronic obstructive pulmonary disease with (acute) exacerbation: Secondary | ICD-10-CM | POA: Diagnosis not present

## 2020-05-23 DIAGNOSIS — F329 Major depressive disorder, single episode, unspecified: Secondary | ICD-10-CM | POA: Diagnosis not present

## 2020-05-23 DIAGNOSIS — I509 Heart failure, unspecified: Secondary | ICD-10-CM | POA: Diagnosis not present

## 2020-05-23 DIAGNOSIS — M549 Dorsalgia, unspecified: Secondary | ICD-10-CM | POA: Diagnosis not present

## 2020-05-23 DIAGNOSIS — F319 Bipolar disorder, unspecified: Secondary | ICD-10-CM | POA: Diagnosis not present

## 2020-05-23 DIAGNOSIS — J9600 Acute respiratory failure, unspecified whether with hypoxia or hypercapnia: Secondary | ICD-10-CM | POA: Diagnosis not present

## 2020-05-24 DIAGNOSIS — E119 Type 2 diabetes mellitus without complications: Secondary | ICD-10-CM | POA: Diagnosis not present

## 2020-05-25 DIAGNOSIS — F319 Bipolar disorder, unspecified: Secondary | ICD-10-CM | POA: Diagnosis not present

## 2020-05-25 DIAGNOSIS — E785 Hyperlipidemia, unspecified: Secondary | ICD-10-CM | POA: Diagnosis not present

## 2020-05-25 DIAGNOSIS — Z87891 Personal history of nicotine dependence: Secondary | ICD-10-CM | POA: Diagnosis not present

## 2020-05-25 DIAGNOSIS — E1165 Type 2 diabetes mellitus with hyperglycemia: Secondary | ICD-10-CM | POA: Diagnosis not present

## 2020-05-25 DIAGNOSIS — M1990 Unspecified osteoarthritis, unspecified site: Secondary | ICD-10-CM | POA: Diagnosis not present

## 2020-05-25 DIAGNOSIS — I509 Heart failure, unspecified: Secondary | ICD-10-CM | POA: Diagnosis not present

## 2020-05-25 DIAGNOSIS — J441 Chronic obstructive pulmonary disease with (acute) exacerbation: Secondary | ICD-10-CM | POA: Diagnosis not present

## 2020-05-25 DIAGNOSIS — J9600 Acute respiratory failure, unspecified whether with hypoxia or hypercapnia: Secondary | ICD-10-CM | POA: Diagnosis not present

## 2020-05-25 DIAGNOSIS — Z7984 Long term (current) use of oral hypoglycemic drugs: Secondary | ICD-10-CM | POA: Diagnosis not present

## 2020-05-25 DIAGNOSIS — Z8701 Personal history of pneumonia (recurrent): Secondary | ICD-10-CM | POA: Diagnosis not present

## 2020-05-25 DIAGNOSIS — I110 Hypertensive heart disease with heart failure: Secondary | ICD-10-CM | POA: Diagnosis not present

## 2020-05-27 DIAGNOSIS — E1165 Type 2 diabetes mellitus with hyperglycemia: Secondary | ICD-10-CM | POA: Diagnosis not present

## 2020-05-27 DIAGNOSIS — I110 Hypertensive heart disease with heart failure: Secondary | ICD-10-CM | POA: Diagnosis not present

## 2020-05-27 DIAGNOSIS — J441 Chronic obstructive pulmonary disease with (acute) exacerbation: Secondary | ICD-10-CM | POA: Diagnosis not present

## 2020-05-27 DIAGNOSIS — F319 Bipolar disorder, unspecified: Secondary | ICD-10-CM | POA: Diagnosis not present

## 2020-05-27 DIAGNOSIS — J9600 Acute respiratory failure, unspecified whether with hypoxia or hypercapnia: Secondary | ICD-10-CM | POA: Diagnosis not present

## 2020-05-27 DIAGNOSIS — I509 Heart failure, unspecified: Secondary | ICD-10-CM | POA: Diagnosis not present

## 2020-05-28 DIAGNOSIS — J441 Chronic obstructive pulmonary disease with (acute) exacerbation: Secondary | ICD-10-CM | POA: Diagnosis not present

## 2020-05-28 DIAGNOSIS — F319 Bipolar disorder, unspecified: Secondary | ICD-10-CM | POA: Diagnosis not present

## 2020-05-28 DIAGNOSIS — E1165 Type 2 diabetes mellitus with hyperglycemia: Secondary | ICD-10-CM | POA: Diagnosis not present

## 2020-05-28 DIAGNOSIS — I110 Hypertensive heart disease with heart failure: Secondary | ICD-10-CM | POA: Diagnosis not present

## 2020-05-28 DIAGNOSIS — J9600 Acute respiratory failure, unspecified whether with hypoxia or hypercapnia: Secondary | ICD-10-CM | POA: Diagnosis not present

## 2020-05-28 DIAGNOSIS — I509 Heart failure, unspecified: Secondary | ICD-10-CM | POA: Diagnosis not present

## 2020-05-29 DIAGNOSIS — F319 Bipolar disorder, unspecified: Secondary | ICD-10-CM | POA: Diagnosis not present

## 2020-05-29 DIAGNOSIS — I110 Hypertensive heart disease with heart failure: Secondary | ICD-10-CM | POA: Diagnosis not present

## 2020-05-29 DIAGNOSIS — J441 Chronic obstructive pulmonary disease with (acute) exacerbation: Secondary | ICD-10-CM | POA: Diagnosis not present

## 2020-05-29 DIAGNOSIS — J9600 Acute respiratory failure, unspecified whether with hypoxia or hypercapnia: Secondary | ICD-10-CM | POA: Diagnosis not present

## 2020-05-29 DIAGNOSIS — I509 Heart failure, unspecified: Secondary | ICD-10-CM | POA: Diagnosis not present

## 2020-05-29 DIAGNOSIS — E1165 Type 2 diabetes mellitus with hyperglycemia: Secondary | ICD-10-CM | POA: Diagnosis not present

## 2020-06-03 ENCOUNTER — Other Ambulatory Visit: Payer: Self-pay

## 2020-06-03 ENCOUNTER — Telehealth (HOSPITAL_COMMUNITY): Payer: Medicare Other | Admitting: Psychiatry

## 2020-06-03 ENCOUNTER — Telehealth: Payer: Self-pay | Admitting: Psychiatry

## 2020-06-03 ENCOUNTER — Telehealth: Payer: Medicare Other | Admitting: Psychiatry

## 2020-06-03 DIAGNOSIS — J441 Chronic obstructive pulmonary disease with (acute) exacerbation: Secondary | ICD-10-CM | POA: Diagnosis not present

## 2020-06-03 DIAGNOSIS — F319 Bipolar disorder, unspecified: Secondary | ICD-10-CM | POA: Diagnosis not present

## 2020-06-03 DIAGNOSIS — J9600 Acute respiratory failure, unspecified whether with hypoxia or hypercapnia: Secondary | ICD-10-CM | POA: Diagnosis not present

## 2020-06-03 DIAGNOSIS — I110 Hypertensive heart disease with heart failure: Secondary | ICD-10-CM | POA: Diagnosis not present

## 2020-06-03 DIAGNOSIS — I509 Heart failure, unspecified: Secondary | ICD-10-CM | POA: Diagnosis not present

## 2020-06-03 DIAGNOSIS — E1165 Type 2 diabetes mellitus with hyperglycemia: Secondary | ICD-10-CM | POA: Diagnosis not present

## 2020-06-03 NOTE — Telephone Encounter (Signed)
Called the patient three times for appointment scheduled today. The patient did not answer the phone. Left voice message to contact the office.

## 2020-06-04 DIAGNOSIS — E1165 Type 2 diabetes mellitus with hyperglycemia: Secondary | ICD-10-CM | POA: Diagnosis not present

## 2020-06-04 DIAGNOSIS — F319 Bipolar disorder, unspecified: Secondary | ICD-10-CM | POA: Diagnosis not present

## 2020-06-04 DIAGNOSIS — I110 Hypertensive heart disease with heart failure: Secondary | ICD-10-CM | POA: Diagnosis not present

## 2020-06-04 DIAGNOSIS — I509 Heart failure, unspecified: Secondary | ICD-10-CM | POA: Diagnosis not present

## 2020-06-04 DIAGNOSIS — J441 Chronic obstructive pulmonary disease with (acute) exacerbation: Secondary | ICD-10-CM | POA: Diagnosis not present

## 2020-06-04 DIAGNOSIS — J9600 Acute respiratory failure, unspecified whether with hypoxia or hypercapnia: Secondary | ICD-10-CM | POA: Diagnosis not present

## 2020-06-05 DIAGNOSIS — J441 Chronic obstructive pulmonary disease with (acute) exacerbation: Secondary | ICD-10-CM | POA: Diagnosis not present

## 2020-06-05 DIAGNOSIS — J9600 Acute respiratory failure, unspecified whether with hypoxia or hypercapnia: Secondary | ICD-10-CM | POA: Diagnosis not present

## 2020-06-05 DIAGNOSIS — F319 Bipolar disorder, unspecified: Secondary | ICD-10-CM | POA: Diagnosis not present

## 2020-06-05 DIAGNOSIS — I110 Hypertensive heart disease with heart failure: Secondary | ICD-10-CM | POA: Diagnosis not present

## 2020-06-05 DIAGNOSIS — E1165 Type 2 diabetes mellitus with hyperglycemia: Secondary | ICD-10-CM | POA: Diagnosis not present

## 2020-06-05 DIAGNOSIS — I509 Heart failure, unspecified: Secondary | ICD-10-CM | POA: Diagnosis not present

## 2020-06-06 DIAGNOSIS — M549 Dorsalgia, unspecified: Secondary | ICD-10-CM | POA: Diagnosis not present

## 2020-06-06 DIAGNOSIS — Z299 Encounter for prophylactic measures, unspecified: Secondary | ICD-10-CM | POA: Diagnosis not present

## 2020-06-06 DIAGNOSIS — J449 Chronic obstructive pulmonary disease, unspecified: Secondary | ICD-10-CM | POA: Diagnosis not present

## 2020-06-06 DIAGNOSIS — I5032 Chronic diastolic (congestive) heart failure: Secondary | ICD-10-CM | POA: Diagnosis not present

## 2020-06-06 DIAGNOSIS — M25569 Pain in unspecified knee: Secondary | ICD-10-CM | POA: Diagnosis not present

## 2020-06-10 ENCOUNTER — Telehealth: Payer: Medicare Other | Admitting: Psychiatry

## 2020-06-10 ENCOUNTER — Other Ambulatory Visit: Payer: Self-pay

## 2020-06-10 ENCOUNTER — Telehealth: Payer: Self-pay | Admitting: Psychiatry

## 2020-06-10 NOTE — Telephone Encounter (Signed)
Called the patient  twice for appointment scheduled today. The patient did not answer the phone. Left voice message to contact the office.  

## 2020-06-12 DIAGNOSIS — F319 Bipolar disorder, unspecified: Secondary | ICD-10-CM | POA: Diagnosis not present

## 2020-06-12 DIAGNOSIS — E1165 Type 2 diabetes mellitus with hyperglycemia: Secondary | ICD-10-CM | POA: Diagnosis not present

## 2020-06-12 DIAGNOSIS — I509 Heart failure, unspecified: Secondary | ICD-10-CM | POA: Diagnosis not present

## 2020-06-12 DIAGNOSIS — J441 Chronic obstructive pulmonary disease with (acute) exacerbation: Secondary | ICD-10-CM | POA: Diagnosis not present

## 2020-06-12 DIAGNOSIS — I110 Hypertensive heart disease with heart failure: Secondary | ICD-10-CM | POA: Diagnosis not present

## 2020-06-12 DIAGNOSIS — J9600 Acute respiratory failure, unspecified whether with hypoxia or hypercapnia: Secondary | ICD-10-CM | POA: Diagnosis not present

## 2020-06-18 ENCOUNTER — Other Ambulatory Visit: Payer: Self-pay | Admitting: *Deleted

## 2020-06-18 DIAGNOSIS — I509 Heart failure, unspecified: Secondary | ICD-10-CM | POA: Diagnosis not present

## 2020-06-18 DIAGNOSIS — J441 Chronic obstructive pulmonary disease with (acute) exacerbation: Secondary | ICD-10-CM | POA: Diagnosis not present

## 2020-06-18 DIAGNOSIS — I110 Hypertensive heart disease with heart failure: Secondary | ICD-10-CM | POA: Diagnosis not present

## 2020-06-18 DIAGNOSIS — F319 Bipolar disorder, unspecified: Secondary | ICD-10-CM | POA: Diagnosis not present

## 2020-06-18 DIAGNOSIS — J9600 Acute respiratory failure, unspecified whether with hypoxia or hypercapnia: Secondary | ICD-10-CM | POA: Diagnosis not present

## 2020-06-18 DIAGNOSIS — E1165 Type 2 diabetes mellitus with hyperglycemia: Secondary | ICD-10-CM | POA: Diagnosis not present

## 2020-06-18 MED ORDER — METOPROLOL TARTRATE 25 MG PO TABS: 12.5000 mg | ORAL_TABLET | Freq: Two times a day (BID) | ORAL | 3 refills | Status: AC

## 2020-06-18 MED ORDER — ATORVASTATIN CALCIUM 10 MG PO TABS: 10.0000 mg | ORAL_TABLET | Freq: Every day | ORAL | 3 refills | Status: AC

## 2020-06-24 DIAGNOSIS — F32A Depression, unspecified: Secondary | ICD-10-CM | POA: Diagnosis not present

## 2020-06-24 DIAGNOSIS — E785 Hyperlipidemia, unspecified: Secondary | ICD-10-CM | POA: Diagnosis not present

## 2020-06-25 DIAGNOSIS — S9002XA Contusion of left ankle, initial encounter: Secondary | ICD-10-CM | POA: Diagnosis not present

## 2020-06-25 DIAGNOSIS — I2510 Atherosclerotic heart disease of native coronary artery without angina pectoris: Secondary | ICD-10-CM | POA: Diagnosis not present

## 2020-06-25 DIAGNOSIS — E785 Hyperlipidemia, unspecified: Secondary | ICD-10-CM | POA: Diagnosis present

## 2020-06-25 DIAGNOSIS — Z299 Encounter for prophylactic measures, unspecified: Secondary | ICD-10-CM | POA: Diagnosis not present

## 2020-06-25 DIAGNOSIS — J9611 Chronic respiratory failure with hypoxia: Secondary | ICD-10-CM | POA: Diagnosis not present

## 2020-06-25 DIAGNOSIS — M7732 Calcaneal spur, left foot: Secondary | ICD-10-CM | POA: Diagnosis not present

## 2020-06-25 DIAGNOSIS — F319 Bipolar disorder, unspecified: Secondary | ICD-10-CM | POA: Diagnosis not present

## 2020-06-25 DIAGNOSIS — J90 Pleural effusion, not elsewhere classified: Secondary | ICD-10-CM | POA: Diagnosis not present

## 2020-06-25 DIAGNOSIS — R000 Tachycardia, unspecified: Secondary | ICD-10-CM | POA: Diagnosis not present

## 2020-06-25 DIAGNOSIS — R0602 Shortness of breath: Secondary | ICD-10-CM | POA: Diagnosis not present

## 2020-06-25 DIAGNOSIS — J449 Chronic obstructive pulmonary disease, unspecified: Secondary | ICD-10-CM | POA: Diagnosis not present

## 2020-06-25 DIAGNOSIS — M7989 Other specified soft tissue disorders: Secondary | ICD-10-CM | POA: Diagnosis not present

## 2020-06-25 DIAGNOSIS — R0902 Hypoxemia: Secondary | ICD-10-CM | POA: Diagnosis not present

## 2020-06-25 DIAGNOSIS — Z66 Do not resuscitate: Secondary | ICD-10-CM | POA: Diagnosis present

## 2020-06-25 DIAGNOSIS — J9600 Acute respiratory failure, unspecified whether with hypoxia or hypercapnia: Secondary | ICD-10-CM | POA: Diagnosis not present

## 2020-06-25 DIAGNOSIS — J8410 Pulmonary fibrosis, unspecified: Secondary | ICD-10-CM | POA: Diagnosis not present

## 2020-06-25 DIAGNOSIS — S8002XA Contusion of left knee, initial encounter: Secondary | ICD-10-CM | POA: Diagnosis not present

## 2020-06-25 DIAGNOSIS — I5023 Acute on chronic systolic (congestive) heart failure: Secondary | ICD-10-CM | POA: Diagnosis not present

## 2020-06-25 DIAGNOSIS — R062 Wheezing: Secondary | ICD-10-CM | POA: Diagnosis not present

## 2020-06-25 DIAGNOSIS — Z7984 Long term (current) use of oral hypoglycemic drugs: Secondary | ICD-10-CM | POA: Diagnosis not present

## 2020-06-25 DIAGNOSIS — I509 Heart failure, unspecified: Secondary | ICD-10-CM | POA: Diagnosis not present

## 2020-06-25 DIAGNOSIS — I358 Other nonrheumatic aortic valve disorders: Secondary | ICD-10-CM | POA: Diagnosis not present

## 2020-06-25 DIAGNOSIS — J9601 Acute respiratory failure with hypoxia: Secondary | ICD-10-CM | POA: Diagnosis not present

## 2020-06-25 DIAGNOSIS — Z87891 Personal history of nicotine dependence: Secondary | ICD-10-CM | POA: Diagnosis not present

## 2020-06-25 DIAGNOSIS — Z20822 Contact with and (suspected) exposure to covid-19: Secondary | ICD-10-CM | POA: Diagnosis not present

## 2020-06-25 DIAGNOSIS — Z7951 Long term (current) use of inhaled steroids: Secondary | ICD-10-CM | POA: Diagnosis not present

## 2020-06-25 DIAGNOSIS — J441 Chronic obstructive pulmonary disease with (acute) exacerbation: Secondary | ICD-10-CM | POA: Diagnosis not present

## 2020-06-25 DIAGNOSIS — R0600 Dyspnea, unspecified: Secondary | ICD-10-CM | POA: Diagnosis not present

## 2020-06-25 DIAGNOSIS — B348 Other viral infections of unspecified site: Secondary | ICD-10-CM | POA: Diagnosis present

## 2020-06-25 DIAGNOSIS — M47814 Spondylosis without myelopathy or radiculopathy, thoracic region: Secondary | ICD-10-CM | POA: Diagnosis not present

## 2020-06-25 DIAGNOSIS — I708 Atherosclerosis of other arteries: Secondary | ICD-10-CM | POA: Diagnosis not present

## 2020-06-25 DIAGNOSIS — E119 Type 2 diabetes mellitus without complications: Secondary | ICD-10-CM | POA: Diagnosis not present

## 2020-06-25 DIAGNOSIS — R5381 Other malaise: Secondary | ICD-10-CM | POA: Diagnosis present

## 2020-06-25 DIAGNOSIS — E1165 Type 2 diabetes mellitus with hyperglycemia: Secondary | ICD-10-CM | POA: Diagnosis not present

## 2020-06-25 DIAGNOSIS — J9621 Acute and chronic respiratory failure with hypoxia: Secondary | ICD-10-CM | POA: Diagnosis not present

## 2020-06-25 DIAGNOSIS — R0689 Other abnormalities of breathing: Secondary | ICD-10-CM | POA: Diagnosis not present

## 2020-06-25 DIAGNOSIS — S9032XA Contusion of left foot, initial encounter: Secondary | ICD-10-CM | POA: Diagnosis not present

## 2020-06-25 DIAGNOSIS — J9612 Chronic respiratory failure with hypercapnia: Secondary | ICD-10-CM | POA: Diagnosis not present

## 2020-06-25 DIAGNOSIS — B9789 Other viral agents as the cause of diseases classified elsewhere: Secondary | ICD-10-CM | POA: Diagnosis not present

## 2020-06-25 DIAGNOSIS — I700 Atherosclerosis of aorta: Secondary | ICD-10-CM | POA: Diagnosis not present

## 2020-06-27 DIAGNOSIS — R0600 Dyspnea, unspecified: Secondary | ICD-10-CM | POA: Diagnosis not present

## 2020-06-27 DIAGNOSIS — J90 Pleural effusion, not elsewhere classified: Secondary | ICD-10-CM | POA: Diagnosis not present

## 2020-06-28 DIAGNOSIS — R0602 Shortness of breath: Secondary | ICD-10-CM | POA: Diagnosis not present

## 2020-06-28 DIAGNOSIS — J441 Chronic obstructive pulmonary disease with (acute) exacerbation: Secondary | ICD-10-CM | POA: Diagnosis not present

## 2020-06-28 DIAGNOSIS — J9601 Acute respiratory failure with hypoxia: Secondary | ICD-10-CM | POA: Diagnosis not present

## 2020-06-28 DIAGNOSIS — E1165 Type 2 diabetes mellitus with hyperglycemia: Secondary | ICD-10-CM | POA: Diagnosis not present

## 2020-06-28 DIAGNOSIS — J90 Pleural effusion, not elsewhere classified: Secondary | ICD-10-CM | POA: Diagnosis not present

## 2020-06-28 DIAGNOSIS — I358 Other nonrheumatic aortic valve disorders: Secondary | ICD-10-CM | POA: Diagnosis not present

## 2020-06-29 DIAGNOSIS — E1165 Type 2 diabetes mellitus with hyperglycemia: Secondary | ICD-10-CM | POA: Diagnosis not present

## 2020-06-29 DIAGNOSIS — J9600 Acute respiratory failure, unspecified whether with hypoxia or hypercapnia: Secondary | ICD-10-CM | POA: Diagnosis not present

## 2020-06-29 DIAGNOSIS — I358 Other nonrheumatic aortic valve disorders: Secondary | ICD-10-CM | POA: Diagnosis not present

## 2020-06-29 DIAGNOSIS — J441 Chronic obstructive pulmonary disease with (acute) exacerbation: Secondary | ICD-10-CM | POA: Diagnosis not present

## 2020-07-02 DIAGNOSIS — I110 Hypertensive heart disease with heart failure: Secondary | ICD-10-CM | POA: Diagnosis not present

## 2020-07-02 DIAGNOSIS — Z9981 Dependence on supplemental oxygen: Secondary | ICD-10-CM | POA: Diagnosis not present

## 2020-07-02 DIAGNOSIS — Z9181 History of falling: Secondary | ICD-10-CM | POA: Diagnosis not present

## 2020-07-02 DIAGNOSIS — Z7984 Long term (current) use of oral hypoglycemic drugs: Secondary | ICD-10-CM | POA: Diagnosis not present

## 2020-07-02 DIAGNOSIS — E785 Hyperlipidemia, unspecified: Secondary | ICD-10-CM | POA: Diagnosis not present

## 2020-07-02 DIAGNOSIS — I509 Heart failure, unspecified: Secondary | ICD-10-CM | POA: Diagnosis not present

## 2020-07-02 DIAGNOSIS — Z7951 Long term (current) use of inhaled steroids: Secondary | ICD-10-CM | POA: Diagnosis not present

## 2020-07-02 DIAGNOSIS — F319 Bipolar disorder, unspecified: Secondary | ICD-10-CM | POA: Diagnosis not present

## 2020-07-02 DIAGNOSIS — J449 Chronic obstructive pulmonary disease, unspecified: Secondary | ICD-10-CM | POA: Diagnosis not present

## 2020-07-02 DIAGNOSIS — I2510 Atherosclerotic heart disease of native coronary artery without angina pectoris: Secondary | ICD-10-CM | POA: Diagnosis not present

## 2020-07-02 DIAGNOSIS — G8929 Other chronic pain: Secondary | ICD-10-CM | POA: Diagnosis not present

## 2020-07-02 DIAGNOSIS — M069 Rheumatoid arthritis, unspecified: Secondary | ICD-10-CM | POA: Diagnosis not present

## 2020-07-02 DIAGNOSIS — Z87891 Personal history of nicotine dependence: Secondary | ICD-10-CM | POA: Diagnosis not present

## 2020-07-02 DIAGNOSIS — J9600 Acute respiratory failure, unspecified whether with hypoxia or hypercapnia: Secondary | ICD-10-CM | POA: Diagnosis not present

## 2020-07-02 DIAGNOSIS — B348 Other viral infections of unspecified site: Secondary | ICD-10-CM | POA: Diagnosis not present

## 2020-07-02 DIAGNOSIS — E1140 Type 2 diabetes mellitus with diabetic neuropathy, unspecified: Secondary | ICD-10-CM | POA: Diagnosis not present

## 2020-07-02 DIAGNOSIS — J9621 Acute and chronic respiratory failure with hypoxia: Secondary | ICD-10-CM | POA: Diagnosis not present

## 2020-07-03 DIAGNOSIS — B348 Other viral infections of unspecified site: Secondary | ICD-10-CM | POA: Diagnosis not present

## 2020-07-03 DIAGNOSIS — J449 Chronic obstructive pulmonary disease, unspecified: Secondary | ICD-10-CM | POA: Diagnosis not present

## 2020-07-03 DIAGNOSIS — I110 Hypertensive heart disease with heart failure: Secondary | ICD-10-CM | POA: Diagnosis not present

## 2020-07-03 DIAGNOSIS — I509 Heart failure, unspecified: Secondary | ICD-10-CM | POA: Diagnosis not present

## 2020-07-03 DIAGNOSIS — E1140 Type 2 diabetes mellitus with diabetic neuropathy, unspecified: Secondary | ICD-10-CM | POA: Diagnosis not present

## 2020-07-03 DIAGNOSIS — J9621 Acute and chronic respiratory failure with hypoxia: Secondary | ICD-10-CM | POA: Diagnosis not present

## 2020-07-04 DIAGNOSIS — E1140 Type 2 diabetes mellitus with diabetic neuropathy, unspecified: Secondary | ICD-10-CM | POA: Diagnosis not present

## 2020-07-04 DIAGNOSIS — B348 Other viral infections of unspecified site: Secondary | ICD-10-CM | POA: Diagnosis not present

## 2020-07-04 DIAGNOSIS — J449 Chronic obstructive pulmonary disease, unspecified: Secondary | ICD-10-CM | POA: Diagnosis not present

## 2020-07-04 DIAGNOSIS — I509 Heart failure, unspecified: Secondary | ICD-10-CM | POA: Diagnosis not present

## 2020-07-04 DIAGNOSIS — J9621 Acute and chronic respiratory failure with hypoxia: Secondary | ICD-10-CM | POA: Diagnosis not present

## 2020-07-04 DIAGNOSIS — I110 Hypertensive heart disease with heart failure: Secondary | ICD-10-CM | POA: Diagnosis not present

## 2020-07-08 DIAGNOSIS — I110 Hypertensive heart disease with heart failure: Secondary | ICD-10-CM | POA: Diagnosis not present

## 2020-07-08 DIAGNOSIS — B348 Other viral infections of unspecified site: Secondary | ICD-10-CM | POA: Diagnosis not present

## 2020-07-08 DIAGNOSIS — J9621 Acute and chronic respiratory failure with hypoxia: Secondary | ICD-10-CM | POA: Diagnosis not present

## 2020-07-08 DIAGNOSIS — I509 Heart failure, unspecified: Secondary | ICD-10-CM | POA: Diagnosis not present

## 2020-07-08 DIAGNOSIS — J449 Chronic obstructive pulmonary disease, unspecified: Secondary | ICD-10-CM | POA: Diagnosis not present

## 2020-07-08 DIAGNOSIS — E1140 Type 2 diabetes mellitus with diabetic neuropathy, unspecified: Secondary | ICD-10-CM | POA: Diagnosis not present

## 2020-07-09 DIAGNOSIS — I509 Heart failure, unspecified: Secondary | ICD-10-CM | POA: Diagnosis not present

## 2020-07-09 DIAGNOSIS — J449 Chronic obstructive pulmonary disease, unspecified: Secondary | ICD-10-CM | POA: Diagnosis not present

## 2020-07-09 DIAGNOSIS — B348 Other viral infections of unspecified site: Secondary | ICD-10-CM | POA: Diagnosis not present

## 2020-07-09 DIAGNOSIS — J9621 Acute and chronic respiratory failure with hypoxia: Secondary | ICD-10-CM | POA: Diagnosis not present

## 2020-07-09 DIAGNOSIS — I110 Hypertensive heart disease with heart failure: Secondary | ICD-10-CM | POA: Diagnosis not present

## 2020-07-09 DIAGNOSIS — E1140 Type 2 diabetes mellitus with diabetic neuropathy, unspecified: Secondary | ICD-10-CM | POA: Diagnosis not present

## 2020-07-10 DIAGNOSIS — Z09 Encounter for follow-up examination after completed treatment for conditions other than malignant neoplasm: Secondary | ICD-10-CM | POA: Diagnosis not present

## 2020-07-10 DIAGNOSIS — J449 Chronic obstructive pulmonary disease, unspecified: Secondary | ICD-10-CM | POA: Diagnosis not present

## 2020-07-10 DIAGNOSIS — I5032 Chronic diastolic (congestive) heart failure: Secondary | ICD-10-CM | POA: Diagnosis not present

## 2020-07-10 DIAGNOSIS — Z299 Encounter for prophylactic measures, unspecified: Secondary | ICD-10-CM | POA: Diagnosis not present

## 2020-07-10 DIAGNOSIS — M545 Low back pain, unspecified: Secondary | ICD-10-CM | POA: Diagnosis not present

## 2020-07-10 DIAGNOSIS — I10 Essential (primary) hypertension: Secondary | ICD-10-CM | POA: Diagnosis not present

## 2020-07-11 DIAGNOSIS — I110 Hypertensive heart disease with heart failure: Secondary | ICD-10-CM | POA: Diagnosis not present

## 2020-07-11 DIAGNOSIS — B348 Other viral infections of unspecified site: Secondary | ICD-10-CM | POA: Diagnosis not present

## 2020-07-11 DIAGNOSIS — J9621 Acute and chronic respiratory failure with hypoxia: Secondary | ICD-10-CM | POA: Diagnosis not present

## 2020-07-11 DIAGNOSIS — I509 Heart failure, unspecified: Secondary | ICD-10-CM | POA: Diagnosis not present

## 2020-07-11 DIAGNOSIS — E1140 Type 2 diabetes mellitus with diabetic neuropathy, unspecified: Secondary | ICD-10-CM | POA: Diagnosis not present

## 2020-07-11 DIAGNOSIS — J449 Chronic obstructive pulmonary disease, unspecified: Secondary | ICD-10-CM | POA: Diagnosis not present

## 2020-07-12 DIAGNOSIS — J449 Chronic obstructive pulmonary disease, unspecified: Secondary | ICD-10-CM | POA: Diagnosis not present

## 2020-07-12 DIAGNOSIS — I509 Heart failure, unspecified: Secondary | ICD-10-CM | POA: Diagnosis not present

## 2020-07-12 DIAGNOSIS — I110 Hypertensive heart disease with heart failure: Secondary | ICD-10-CM | POA: Diagnosis not present

## 2020-07-12 DIAGNOSIS — E1140 Type 2 diabetes mellitus with diabetic neuropathy, unspecified: Secondary | ICD-10-CM | POA: Diagnosis not present

## 2020-07-12 DIAGNOSIS — J9621 Acute and chronic respiratory failure with hypoxia: Secondary | ICD-10-CM | POA: Diagnosis not present

## 2020-07-12 DIAGNOSIS — B348 Other viral infections of unspecified site: Secondary | ICD-10-CM | POA: Diagnosis not present

## 2020-07-14 DIAGNOSIS — I509 Heart failure, unspecified: Secondary | ICD-10-CM | POA: Diagnosis not present

## 2020-07-14 DIAGNOSIS — I110 Hypertensive heart disease with heart failure: Secondary | ICD-10-CM | POA: Diagnosis not present

## 2020-07-14 DIAGNOSIS — J449 Chronic obstructive pulmonary disease, unspecified: Secondary | ICD-10-CM | POA: Diagnosis not present

## 2020-07-14 DIAGNOSIS — J9621 Acute and chronic respiratory failure with hypoxia: Secondary | ICD-10-CM | POA: Diagnosis not present

## 2020-07-14 DIAGNOSIS — E1140 Type 2 diabetes mellitus with diabetic neuropathy, unspecified: Secondary | ICD-10-CM | POA: Diagnosis not present

## 2020-07-14 DIAGNOSIS — B348 Other viral infections of unspecified site: Secondary | ICD-10-CM | POA: Diagnosis not present

## 2020-07-16 DIAGNOSIS — J9621 Acute and chronic respiratory failure with hypoxia: Secondary | ICD-10-CM | POA: Diagnosis not present

## 2020-07-16 DIAGNOSIS — B348 Other viral infections of unspecified site: Secondary | ICD-10-CM | POA: Diagnosis not present

## 2020-07-16 DIAGNOSIS — I110 Hypertensive heart disease with heart failure: Secondary | ICD-10-CM | POA: Diagnosis not present

## 2020-07-16 DIAGNOSIS — J449 Chronic obstructive pulmonary disease, unspecified: Secondary | ICD-10-CM | POA: Diagnosis not present

## 2020-07-16 DIAGNOSIS — I509 Heart failure, unspecified: Secondary | ICD-10-CM | POA: Diagnosis not present

## 2020-07-16 DIAGNOSIS — E1140 Type 2 diabetes mellitus with diabetic neuropathy, unspecified: Secondary | ICD-10-CM | POA: Diagnosis not present

## 2020-07-16 NOTE — Progress Notes (Signed)
Virtual Visit via Telephone Note  I connected with Brenda Mcdonald on 07/17/20 at 11:00 AM EST by telephone and verified that I am speaking with the correct person using two identifiers.  Location: Patient: home Provider: office Persons participated in the visit- patient, provider, Brenda Mcdonald (niece) to help communication   I discussed the limitations, risks, security and privacy concerns of performing an evaluation and management service by telephone and the availability of in person appointments. I also discussed with the patient that there may be a patient responsible charge related to this service. The patient expressed understanding and agreed to proceed.     I discussed the assessment and treatment plan with the patient. The patient was provided an opportunity to ask questions and all were answered. The patient agreed with the plan and demonstrated an understanding of the instructions.   The patient was advised to call back or seek an in-person evaluation if the symptoms worsen or if the condition fails to improve as anticipated.  I provided 13 minutes of non-Mcdonald-to-Mcdonald time during this encounter.   Brenda Hotter, MD     [facility] MD/PA/NP OP Progress Note  07/17/2020 11:20 AM Brenda Mcdonald  MRN:  [id number]  Chief Complaint:  Chief Complaint    Follow-up     HPI:  This is a follow-up appointment for depression.  She states that she is doing fine, although her nerves is not good.  She was sick on Thanksgiving.  She is looking forward to the Christmas, when her children are visiting.  She tends to stay in the house most of the time.   Brenda Mcdonald, her niece presents to the interview to elaborate the story.  Brenda Mcdonald has been the same.  She tends to say that she is depressed.  Her she has not noticed any difference since the last visit. She has insomnia, which Brenda Mcdonald attributes to be on oxygen and CPAP machine. She sits in the chair most of the time.  She has insomnia.  She has decreased  appetite.  Brenda Mcdonald denies any safety concern. No known hallucinations.  Brenda Mcdonald takes medication regularly.   Functional Status Instrumental Activities of Daily Living (IADLs):  Brenda Mcdonald is independent in the following: managing finances, medications, driving Requires assistance with the following:   Activities of Daily Living (ADLs):  Brenda Mcdonald is independent in the following: bathing and hygiene, feeding, grooming and toileting Dependent- continence   Employment: used to work at nursing home Support: Brenda Mcdonald, her niece Household: husband Marital status: married twice Number of children: 4. 8 grandchildren Born and grew up in [place], she reports "bad childhood", her mother died at age 36  Visit Diagnosis:    ICD-10-CM   1. MDD (major depressive disorder), recurrent episode, mild (HCC)  F33.0     Past Psychiatric History: Please see initial evaluation for full details. I have reviewed the history. No updates at this time.    Past Medical History:  Past Medical History:  Diagnosis Date  . Anxiety   . Arthritis   . Bipolar disorder (HCC)   . COPD (chronic obstructive pulmonary disease) (HCC)   . Coronary atherosclerosis of native coronary artery    Nonobstructive  . Depression   . Diabetes mellitus, type 2 (HCC)   . Dysphagia   . Essential hypertension   . GERD (gastroesophageal reflux disease)   . Pneumonia   . Pulmonary nodule July 2009   Nonspecific 5 mm right middle lobe  . TIA (transient ischemic attack)  Past Surgical History:  Procedure Laterality Date  . ABDOMINAL HYSTERECTOMY    . APPENDECTOMY    . CARDIAC CATHETERIZATION  11/2010  . CATARACT EXTRACTION, BILATERAL Bilateral   . CHOLECYSTECTOMY    . DILATION AND CURETTAGE OF UTERUS    . INCISION AND DRAINAGE Left 03/12/2014   Procedure: LEFT INCISION/DRAINAGE DEEP ABSCESS/BURSA/HEMATOMA THIGH/KNEE REGION;  Surgeon: [name], MD;  Location: [facility] OR;  Service: Orthopedics;  Laterality:  Left;  . KNEE ARTHROSCOPY Left   . ORIF DISTAL FEMUR FRACTURE Left 01/24/2014  . ORIF FEMUR FRACTURE Left 01/24/2014   Procedure: OPEN REDUCTION INTERNAL FIXATION (ORIF) DISTAL FEMUR FRACTURE;  Surgeon: [name], MD;  Location: [facility] OR;  Service: Orthopedics;  Laterality: Left;  . TIBIA FRACTURE SURGERY Left ~ 1959  . TOTAL KNEE ARTHROPLASTY Left 2000  . VESICOVAGINAL FISTULA CLOSURE W/ TAH      Family Psychiatric History: Please see initial evaluation for full details. I have reviewed the history. No updates at this time.     Family History:  Family History  Problem Relation Age of Onset  . Coronary artery disease Other     Social History:  Social History   Socioeconomic History  . Marital status: Married    Spouse name: Not on file  . Number of children: Not on file  . Years of education: Not on file  . Highest education level: Not on file  Occupational History  . Not on file  Tobacco Use  . Smoking status: Former Smoker    Packs/day: 0.50    Years: 58.00    Pack years: 29.00    Types: Cigarettes    Quit date: 07/26/2010    Years since quitting: 9.9  . Smokeless tobacco: Never Used  Substance and Sexual Activity  . Alcohol use: No    Alcohol/week: 0.0 standard drinks  . Drug use: No  . Sexual activity: Never  Other Topics Concern  . Not on file  Social History Narrative  . Not on file   Social Determinants of Health   Financial Resource Strain: Not on file  Food Insecurity: Not on file  Transportation Needs: Not on file  Physical Activity: Not on file  Stress: Not on file  Social Connections: Not on file    Allergies:  Allergies  Allergen Reactions  . Azithromycin     Per MAR  . Penicillins     Tolerated Rocephin (July 2015)  . Tetracycline     Per MAR    Metabolic Disorder Labs: No results found for: HGBA1C, MPG No results found for: PROLACTIN Lab Results  Component Value Date   CHOL  09/06/2008    164        ATP III CLASSIFICATION:   <200     mg/dL   Desirable  161-096  mg/dL   Borderline High  >=045    mg/dL   High          TRIG 86 09/06/2008   HDL 49 09/06/2008   CHOLHDL 3.3 09/06/2008   VLDL 17 09/06/2008   LDLCALC  09/06/2008    98        Total Cholesterol/HDL:CHD Risk Coronary Heart Disease Risk Table                     Men   Women  1/2 Average Risk   3.4   3.3  Average Risk       5.0   4.4  2 X Average Risk  9.6   7.1  3 X Average Risk  23.4   11.0        Use the calculated Patient Ratio above and the CHD Risk Table to determine the patient's CHD Risk.        ATP III CLASSIFICATION (LDL):  <100     mg/dL   Optimal  244-010  mg/dL   Near or Above                    Optimal  130-159  mg/dL   Borderline  272-536  mg/dL   High  >644     mg/dL   Very High   Lab Results  Component Value Date   TSH 0.71 01/09/2020    Therapeutic Level Labs: No results found for: LITHIUM No results found for: VALPROATE No components found for:  CBMZ  Current Medications: Current Outpatient Medications  Medication Sig Dispense Refill  . albuterol (PROVENTIL HFA;VENTOLIN HFA) 108 (90 BASE) MCG/ACT inhaler Inhale 2 puffs into the lungs every 6 (six) hours as needed for wheezing or shortness of breath.    Marland Kitchen amLODipine (NORVASC) 5 MG tablet Take 5 mg by mouth daily.    Marland Kitchen atorvastatin (LIPITOR) 10 MG tablet Take 1 tablet (10 mg total) by mouth at bedtime. 90 tablet 3  . busPIRone (BUSPAR) 10 MG tablet Take 1 tablet (10 mg total) by mouth 3 (three) times daily as needed (anxiety). 84 tablet 3  . escitalopram (LEXAPRO) 10 MG tablet Take 1 tablet (10 mg total) by mouth daily. 28 tablet 3  . furosemide (LASIX) 40 MG tablet Take 40 mg by mouth as needed.     Marland Kitchen glimepiride (AMARYL) 2 MG tablet Take 1 tablet by mouth 2 (two) times daily.    Marland Kitchen HYDROcodone-acetaminophen (NORCO/VICODIN) 5-325 MG per tablet Take 1 tablet by mouth every 4 (four) hours as needed for moderate pain. (Patient taking differently: Take 1 tablet by mouth  2 (two) times daily. ) 30 tablet 0  . ipratropium-albuterol (DUONEB) 0.5-2.5 (3) MG/3ML SOLN Take 3 mLs by nebulization 4 (four) times daily as needed. Shortness of breath    . JANUVIA 50 MG tablet Take 1 tablet by mouth daily.    Marland Kitchen lamoTRIgine (LAMICTAL) 100 MG tablet Take 1 tablet (100 mg total) by mouth at bedtime. 28 tablet 3  . memantine (NAMENDA XR) 14 MG CP24 24 hr capsule Take 14 mg by mouth daily.    . metoprolol tartrate (LOPRESSOR) 25 MG tablet Take 0.5 tablets (12.5 mg total) by mouth 2 (two) times daily. 90 tablet 3  . mirtazapine (REMERON) 30 MG tablet Take 1 tablet (30 mg total) by mouth at bedtime. 28 tablet 3  . VITAMIN D, CHOLECALCIFEROL, PO Take 1 tablet by mouth daily.     No current facility-administered medications for this visit.     Musculoskeletal: Strength & Muscle Tone: N/A Gait & Station: N/A Patient leans: N/A  Psychiatric Specialty Exam: Review of Systems  Psychiatric/Behavioral: Positive for dysphoric mood and sleep disturbance. Negative for agitation, behavioral problems, confusion, decreased concentration, hallucinations, self-injury and suicidal ideas. The patient is nervous/anxious. The patient is not hyperactive.   All other systems reviewed and are negative.   There were no vitals taken for this visit.There is no height or weight on file to calculate BMI.  General Appearance: NA  Eye Contact:  NA  Speech:  Clear and Coherent  Volume:  Normal  Mood:  Anxious  Affect:  NA  Thought Process:  Coherent  Orientation:  Full (Time, Place, and Person)  Thought Content: Logical   Suicidal Thoughts:  No  Homicidal Thoughts:  No  Memory:  Immediate;   Good  Judgement:  Good  Insight:  Fair  Psychomotor Activity:  Normal  Concentration:  Concentration: Good and Attention Span: Good  Recall:  Good  Fund of Knowledge: Good  Language: Good  Akathisia:  No  Handed:  Right  AIMS (if indicated): not done  Assets:  Communication Skills Desire for  Improvement  ADL's:  Intact  Cognition: WNL  Sleep:  Good   Screenings: PHQ2-9   Flowsheet Row Office Visit from 04/09/2014 in [facility] for Infectious Disease  PHQ-2 Total Score 0       Assessment and Plan:  Brenda Mcdonald is a 79 y.o. year old female with a history of bipolar II disorder, major neurocognitive disorder, hypertension,CAD,COPD, TIA, type II DM , who presents for follow up appointment for below.   1. MDD (major depressive disorder), recurrent episode, mild (HCC) She continue to report occasional depressive symptoms and anxiety since the last visit. Will continue current medication given it has been relatively at her baseline and to avoid polypharmacy. Will continue lexapro to target depression.  We will continue mirtazapine adjunctive treatment for depression.  Will continue lamotrigine for mood dysregulation.  They are aware of its potential risk of Stevens-Johnson syndrome.  Will continue quetiapine for mood dysregulation/adjunctive treatment for depression.  Discussed potential metabolic side effect and EPS.  Will continue BuSpar to target anxiety.  Noted that the patient has strong preference to stay on her current medication, and had worsening mood symptoms, which coincided with tapering down lamotrigine/other psychotropics.  Although she will greatly benefit from CBT, she is not interested in this unless it is in person visit.   #Major neurocognitive disorder She does have acognitive deficits as characterized by MOCA. She is on memantine, prescribed by PCP. Will continue to monitor.  Plan  I have reviewed and updated plans as below 1. Continue lexapro 10 mg daily 2.Continuemirtazapine  at night 3.Continuelamotrigine100mg  daily 4.Continuequetiapine  night 5. ContinueBuspar10 mg three times a day as needed for anxiety  6.Next appointment- 3/23 at 9:20 for 30 mins, phone [phone] Brenda Mcdonald) 7.(She is on memantine 14 mg  daily)   The patient demonstrates the following risk factors for suicide: Chronic risk factors for suicide include: psychiatric disorder of bipolar disorder. Acute risk factorsfor suicide include: unemployment and social withdrawal/isolation. Protective factorsfor this patient include: positive social support, coping skills. Considering these factors, the overall suicide risk at this point appears to be low. Patient isappropriate for outpatient follow up.   Brenda Hotter, MD 07/17/2020, 11:20 AM

## 2020-07-17 ENCOUNTER — Other Ambulatory Visit: Payer: Self-pay

## 2020-07-17 ENCOUNTER — Telehealth (INDEPENDENT_AMBULATORY_CARE_PROVIDER_SITE_OTHER): Payer: Medicare Other | Admitting: Psychiatry

## 2020-07-17 ENCOUNTER — Encounter: Payer: Self-pay | Admitting: Psychiatry

## 2020-07-17 DIAGNOSIS — F330 Major depressive disorder, recurrent, mild: Secondary | ICD-10-CM

## 2020-07-17 MED ORDER — LAMOTRIGINE 100 MG PO TABS
100.0000 mg | ORAL_TABLET | Freq: Every day | ORAL | 3 refills | Status: DC
Start: 2020-07-17 — End: 2020-10-20

## 2020-07-17 MED ORDER — BUSPIRONE HCL 10 MG PO TABS
10.0000 mg | ORAL_TABLET | Freq: Three times a day (TID) | ORAL | 3 refills | Status: DC | PRN
Start: 2020-07-17 — End: 2020-10-20

## 2020-07-17 MED ORDER — MIRTAZAPINE 30 MG PO TABS
30.0000 mg | ORAL_TABLET | Freq: Every day | ORAL | 3 refills | Status: DC
Start: 2020-07-17 — End: 2020-10-20

## 2020-07-17 MED ORDER — ESCITALOPRAM OXALATE 10 MG PO TABS
10.0000 mg | ORAL_TABLET | Freq: Every day | ORAL | 3 refills | Status: DC
Start: 2020-07-17 — End: 2020-10-20

## 2020-07-17 NOTE — Patient Instructions (Signed)
1. Continue lexapro 10 mg daily 2.Continuemirtazapine 30mg  at night 3.Continuelamotrigine100mg  daily 4.Continuequetiapine 50mg  night 5. ContinueBuspar10 mg three times a day as needed for anxiety  6.Next appointment- 3/23 at 9:20

## 2020-07-21 DIAGNOSIS — B348 Other viral infections of unspecified site: Secondary | ICD-10-CM | POA: Diagnosis not present

## 2020-07-21 DIAGNOSIS — I110 Hypertensive heart disease with heart failure: Secondary | ICD-10-CM | POA: Diagnosis not present

## 2020-07-21 DIAGNOSIS — J9621 Acute and chronic respiratory failure with hypoxia: Secondary | ICD-10-CM | POA: Diagnosis not present

## 2020-07-21 DIAGNOSIS — I509 Heart failure, unspecified: Secondary | ICD-10-CM | POA: Diagnosis not present

## 2020-07-21 DIAGNOSIS — J449 Chronic obstructive pulmonary disease, unspecified: Secondary | ICD-10-CM | POA: Diagnosis not present

## 2020-07-21 DIAGNOSIS — E1140 Type 2 diabetes mellitus with diabetic neuropathy, unspecified: Secondary | ICD-10-CM | POA: Diagnosis not present

## 2020-07-22 DIAGNOSIS — J9621 Acute and chronic respiratory failure with hypoxia: Secondary | ICD-10-CM | POA: Diagnosis not present

## 2020-07-22 DIAGNOSIS — I509 Heart failure, unspecified: Secondary | ICD-10-CM | POA: Diagnosis not present

## 2020-07-22 DIAGNOSIS — I110 Hypertensive heart disease with heart failure: Secondary | ICD-10-CM | POA: Diagnosis not present

## 2020-07-22 DIAGNOSIS — B348 Other viral infections of unspecified site: Secondary | ICD-10-CM | POA: Diagnosis not present

## 2020-07-22 DIAGNOSIS — E1140 Type 2 diabetes mellitus with diabetic neuropathy, unspecified: Secondary | ICD-10-CM | POA: Diagnosis not present

## 2020-07-22 DIAGNOSIS — J449 Chronic obstructive pulmonary disease, unspecified: Secondary | ICD-10-CM | POA: Diagnosis not present

## 2020-07-23 DIAGNOSIS — I110 Hypertensive heart disease with heart failure: Secondary | ICD-10-CM | POA: Diagnosis not present

## 2020-07-23 DIAGNOSIS — B348 Other viral infections of unspecified site: Secondary | ICD-10-CM | POA: Diagnosis not present

## 2020-07-23 DIAGNOSIS — E1140 Type 2 diabetes mellitus with diabetic neuropathy, unspecified: Secondary | ICD-10-CM | POA: Diagnosis not present

## 2020-07-23 DIAGNOSIS — I509 Heart failure, unspecified: Secondary | ICD-10-CM | POA: Diagnosis not present

## 2020-07-23 DIAGNOSIS — J449 Chronic obstructive pulmonary disease, unspecified: Secondary | ICD-10-CM | POA: Diagnosis not present

## 2020-07-23 DIAGNOSIS — J9621 Acute and chronic respiratory failure with hypoxia: Secondary | ICD-10-CM | POA: Diagnosis not present

## 2020-07-27 DIAGNOSIS — I10 Essential (primary) hypertension: Secondary | ICD-10-CM | POA: Diagnosis not present

## 2020-07-27 DIAGNOSIS — J90 Pleural effusion, not elsewhere classified: Secondary | ICD-10-CM | POA: Diagnosis not present

## 2020-07-27 DIAGNOSIS — R0902 Hypoxemia: Secondary | ICD-10-CM | POA: Diagnosis not present

## 2020-07-27 DIAGNOSIS — I509 Heart failure, unspecified: Secondary | ICD-10-CM | POA: Diagnosis not present

## 2020-07-27 DIAGNOSIS — R0602 Shortness of breath: Secondary | ICD-10-CM | POA: Diagnosis not present

## 2020-07-27 DIAGNOSIS — I959 Hypotension, unspecified: Secondary | ICD-10-CM | POA: Diagnosis not present

## 2020-07-28 DIAGNOSIS — I509 Heart failure, unspecified: Secondary | ICD-10-CM | POA: Diagnosis not present

## 2020-07-28 DIAGNOSIS — J9621 Acute and chronic respiratory failure with hypoxia: Secondary | ICD-10-CM | POA: Diagnosis not present

## 2020-07-28 DIAGNOSIS — J449 Chronic obstructive pulmonary disease, unspecified: Secondary | ICD-10-CM | POA: Diagnosis not present

## 2020-07-28 DIAGNOSIS — B348 Other viral infections of unspecified site: Secondary | ICD-10-CM | POA: Diagnosis not present

## 2020-07-28 DIAGNOSIS — E1140 Type 2 diabetes mellitus with diabetic neuropathy, unspecified: Secondary | ICD-10-CM | POA: Diagnosis not present

## 2020-07-28 DIAGNOSIS — I110 Hypertensive heart disease with heart failure: Secondary | ICD-10-CM | POA: Diagnosis not present

## 2020-07-29 DIAGNOSIS — B348 Other viral infections of unspecified site: Secondary | ICD-10-CM | POA: Diagnosis not present

## 2020-07-29 DIAGNOSIS — E1140 Type 2 diabetes mellitus with diabetic neuropathy, unspecified: Secondary | ICD-10-CM | POA: Diagnosis not present

## 2020-07-29 DIAGNOSIS — I110 Hypertensive heart disease with heart failure: Secondary | ICD-10-CM | POA: Diagnosis not present

## 2020-07-29 DIAGNOSIS — I509 Heart failure, unspecified: Secondary | ICD-10-CM | POA: Diagnosis not present

## 2020-07-29 DIAGNOSIS — J9621 Acute and chronic respiratory failure with hypoxia: Secondary | ICD-10-CM | POA: Diagnosis not present

## 2020-07-29 DIAGNOSIS — J449 Chronic obstructive pulmonary disease, unspecified: Secondary | ICD-10-CM | POA: Diagnosis not present

## 2020-07-30 DIAGNOSIS — R2689 Other abnormalities of gait and mobility: Secondary | ICD-10-CM | POA: Diagnosis not present

## 2020-07-30 DIAGNOSIS — Z20822 Contact with and (suspected) exposure to covid-19: Secondary | ICD-10-CM | POA: Diagnosis present

## 2020-07-30 DIAGNOSIS — Z9181 History of falling: Secondary | ICD-10-CM | POA: Diagnosis not present

## 2020-07-30 DIAGNOSIS — Z880 Allergy status to penicillin: Secondary | ICD-10-CM | POA: Diagnosis not present

## 2020-07-30 DIAGNOSIS — I110 Hypertensive heart disease with heart failure: Secondary | ICD-10-CM | POA: Diagnosis present

## 2020-07-30 DIAGNOSIS — J9610 Chronic respiratory failure, unspecified whether with hypoxia or hypercapnia: Secondary | ICD-10-CM | POA: Diagnosis present

## 2020-07-30 DIAGNOSIS — S270XXA Traumatic pneumothorax, initial encounter: Secondary | ICD-10-CM | POA: Diagnosis present

## 2020-07-30 DIAGNOSIS — Z9981 Dependence on supplemental oxygen: Secondary | ICD-10-CM | POA: Diagnosis not present

## 2020-07-30 DIAGNOSIS — R52 Pain, unspecified: Secondary | ICD-10-CM | POA: Diagnosis not present

## 2020-07-30 DIAGNOSIS — E119 Type 2 diabetes mellitus without complications: Secondary | ICD-10-CM | POA: Diagnosis present

## 2020-07-30 DIAGNOSIS — J439 Emphysema, unspecified: Secondary | ICD-10-CM | POA: Diagnosis not present

## 2020-07-30 DIAGNOSIS — I509 Heart failure, unspecified: Secondary | ICD-10-CM | POA: Diagnosis present

## 2020-07-30 DIAGNOSIS — M6281 Muscle weakness (generalized): Secondary | ICD-10-CM | POA: Diagnosis not present

## 2020-07-30 DIAGNOSIS — F0390 Unspecified dementia without behavioral disturbance: Secondary | ICD-10-CM | POA: Diagnosis not present

## 2020-07-30 DIAGNOSIS — S2231XA Fracture of one rib, right side, initial encounter for closed fracture: Secondary | ICD-10-CM | POA: Diagnosis not present

## 2020-07-30 DIAGNOSIS — I700 Atherosclerosis of aorta: Secondary | ICD-10-CM | POA: Diagnosis not present

## 2020-07-30 DIAGNOSIS — I10 Essential (primary) hypertension: Secondary | ICD-10-CM | POA: Diagnosis not present

## 2020-07-30 DIAGNOSIS — I517 Cardiomegaly: Secondary | ICD-10-CM | POA: Diagnosis not present

## 2020-07-30 DIAGNOSIS — J9611 Chronic respiratory failure with hypoxia: Secondary | ICD-10-CM | POA: Diagnosis not present

## 2020-07-30 DIAGNOSIS — R41841 Cognitive communication deficit: Secondary | ICD-10-CM | POA: Diagnosis not present

## 2020-07-30 DIAGNOSIS — E785 Hyperlipidemia, unspecified: Secondary | ICD-10-CM | POA: Diagnosis present

## 2020-07-30 DIAGNOSIS — S2241XD Multiple fractures of ribs, right side, subsequent encounter for fracture with routine healing: Secondary | ICD-10-CM | POA: Diagnosis not present

## 2020-07-30 DIAGNOSIS — I959 Hypotension, unspecified: Secondary | ICD-10-CM | POA: Diagnosis not present

## 2020-07-30 DIAGNOSIS — M549 Dorsalgia, unspecified: Secondary | ICD-10-CM | POA: Diagnosis not present

## 2020-07-30 DIAGNOSIS — Z66 Do not resuscitate: Secondary | ICD-10-CM | POA: Diagnosis present

## 2020-07-30 DIAGNOSIS — W19XXXA Unspecified fall, initial encounter: Secondary | ICD-10-CM | POA: Diagnosis not present

## 2020-07-30 DIAGNOSIS — J449 Chronic obstructive pulmonary disease, unspecified: Secondary | ICD-10-CM | POA: Diagnosis present

## 2020-07-30 DIAGNOSIS — Z7951 Long term (current) use of inhaled steroids: Secondary | ICD-10-CM | POA: Diagnosis not present

## 2020-07-30 DIAGNOSIS — J9383 Other pneumothorax: Secondary | ICD-10-CM | POA: Diagnosis not present

## 2020-07-30 DIAGNOSIS — J811 Chronic pulmonary edema: Secondary | ICD-10-CM | POA: Diagnosis not present

## 2020-07-30 DIAGNOSIS — R609 Edema, unspecified: Secondary | ICD-10-CM | POA: Diagnosis not present

## 2020-07-30 DIAGNOSIS — J939 Pneumothorax, unspecified: Secondary | ICD-10-CM | POA: Diagnosis not present

## 2020-07-30 DIAGNOSIS — T797XXA Traumatic subcutaneous emphysema, initial encounter: Secondary | ICD-10-CM | POA: Diagnosis not present

## 2020-07-30 DIAGNOSIS — F319 Bipolar disorder, unspecified: Secondary | ICD-10-CM | POA: Diagnosis present

## 2020-07-30 DIAGNOSIS — S2241XA Multiple fractures of ribs, right side, initial encounter for closed fracture: Secondary | ICD-10-CM | POA: Diagnosis not present

## 2020-07-30 DIAGNOSIS — Z87891 Personal history of nicotine dependence: Secondary | ICD-10-CM | POA: Diagnosis not present

## 2020-07-31 DIAGNOSIS — S2241XA Multiple fractures of ribs, right side, initial encounter for closed fracture: Secondary | ICD-10-CM | POA: Diagnosis not present

## 2020-07-31 DIAGNOSIS — S2231XA Fracture of one rib, right side, initial encounter for closed fracture: Secondary | ICD-10-CM | POA: Diagnosis not present

## 2020-07-31 DIAGNOSIS — I700 Atherosclerosis of aorta: Secondary | ICD-10-CM | POA: Diagnosis not present

## 2020-07-31 DIAGNOSIS — J939 Pneumothorax, unspecified: Secondary | ICD-10-CM | POA: Diagnosis not present

## 2020-07-31 DIAGNOSIS — J439 Emphysema, unspecified: Secondary | ICD-10-CM | POA: Diagnosis not present

## 2020-07-31 DIAGNOSIS — W19XXXA Unspecified fall, initial encounter: Secondary | ICD-10-CM | POA: Diagnosis not present

## 2020-07-31 DIAGNOSIS — J9383 Other pneumothorax: Secondary | ICD-10-CM | POA: Diagnosis not present

## 2020-08-01 DIAGNOSIS — J9610 Chronic respiratory failure, unspecified whether with hypoxia or hypercapnia: Secondary | ICD-10-CM | POA: Diagnosis present

## 2020-08-01 DIAGNOSIS — S2241XD Multiple fractures of ribs, right side, subsequent encounter for fracture with routine healing: Secondary | ICD-10-CM | POA: Diagnosis not present

## 2020-08-01 DIAGNOSIS — Z880 Allergy status to penicillin: Secondary | ICD-10-CM | POA: Diagnosis not present

## 2020-08-01 DIAGNOSIS — J449 Chronic obstructive pulmonary disease, unspecified: Secondary | ICD-10-CM | POA: Diagnosis present

## 2020-08-01 DIAGNOSIS — Z66 Do not resuscitate: Secondary | ICD-10-CM | POA: Diagnosis present

## 2020-08-01 DIAGNOSIS — Z743 Need for continuous supervision: Secondary | ICD-10-CM | POA: Diagnosis not present

## 2020-08-01 DIAGNOSIS — S270XXA Traumatic pneumothorax, initial encounter: Secondary | ICD-10-CM | POA: Diagnosis present

## 2020-08-01 DIAGNOSIS — I509 Heart failure, unspecified: Secondary | ICD-10-CM | POA: Diagnosis present

## 2020-08-01 DIAGNOSIS — F319 Bipolar disorder, unspecified: Secondary | ICD-10-CM | POA: Diagnosis present

## 2020-08-01 DIAGNOSIS — I700 Atherosclerosis of aorta: Secondary | ICD-10-CM | POA: Diagnosis present

## 2020-08-01 DIAGNOSIS — S2241XA Multiple fractures of ribs, right side, initial encounter for closed fracture: Secondary | ICD-10-CM | POA: Diagnosis present

## 2020-08-01 DIAGNOSIS — Z9181 History of falling: Secondary | ICD-10-CM | POA: Diagnosis not present

## 2020-08-01 DIAGNOSIS — Z9981 Dependence on supplemental oxygen: Secondary | ICD-10-CM | POA: Diagnosis not present

## 2020-08-01 DIAGNOSIS — J811 Chronic pulmonary edema: Secondary | ICD-10-CM | POA: Diagnosis not present

## 2020-08-01 DIAGNOSIS — W19XXXA Unspecified fall, initial encounter: Secondary | ICD-10-CM | POA: Diagnosis not present

## 2020-08-01 DIAGNOSIS — Z7951 Long term (current) use of inhaled steroids: Secondary | ICD-10-CM | POA: Diagnosis not present

## 2020-08-01 DIAGNOSIS — I10 Essential (primary) hypertension: Secondary | ICD-10-CM | POA: Diagnosis not present

## 2020-08-01 DIAGNOSIS — F0390 Unspecified dementia without behavioral disturbance: Secondary | ICD-10-CM | POA: Diagnosis not present

## 2020-08-01 DIAGNOSIS — R69 Illness, unspecified: Secondary | ICD-10-CM | POA: Diagnosis not present

## 2020-08-01 DIAGNOSIS — J439 Emphysema, unspecified: Secondary | ICD-10-CM | POA: Diagnosis not present

## 2020-08-01 DIAGNOSIS — I517 Cardiomegaly: Secondary | ICD-10-CM | POA: Diagnosis not present

## 2020-08-01 DIAGNOSIS — I110 Hypertensive heart disease with heart failure: Secondary | ICD-10-CM | POA: Diagnosis present

## 2020-08-01 DIAGNOSIS — J9611 Chronic respiratory failure with hypoxia: Secondary | ICD-10-CM | POA: Diagnosis not present

## 2020-08-01 DIAGNOSIS — R2689 Other abnormalities of gait and mobility: Secondary | ICD-10-CM | POA: Diagnosis not present

## 2020-08-01 DIAGNOSIS — J9383 Other pneumothorax: Secondary | ICD-10-CM | POA: Diagnosis not present

## 2020-08-01 DIAGNOSIS — R41841 Cognitive communication deficit: Secondary | ICD-10-CM | POA: Diagnosis not present

## 2020-08-01 DIAGNOSIS — J939 Pneumothorax, unspecified: Secondary | ICD-10-CM | POA: Diagnosis not present

## 2020-08-01 DIAGNOSIS — E785 Hyperlipidemia, unspecified: Secondary | ICD-10-CM | POA: Diagnosis present

## 2020-08-01 DIAGNOSIS — T797XXA Traumatic subcutaneous emphysema, initial encounter: Secondary | ICD-10-CM | POA: Diagnosis present

## 2020-08-01 DIAGNOSIS — M6281 Muscle weakness (generalized): Secondary | ICD-10-CM | POA: Diagnosis not present

## 2020-08-01 DIAGNOSIS — E119 Type 2 diabetes mellitus without complications: Secondary | ICD-10-CM | POA: Diagnosis present

## 2020-08-01 DIAGNOSIS — Z20822 Contact with and (suspected) exposure to covid-19: Secondary | ICD-10-CM | POA: Diagnosis present

## 2020-08-01 DIAGNOSIS — Z87891 Personal history of nicotine dependence: Secondary | ICD-10-CM | POA: Diagnosis not present

## 2020-08-04 DIAGNOSIS — R41841 Cognitive communication deficit: Secondary | ICD-10-CM | POA: Diagnosis not present

## 2020-08-04 DIAGNOSIS — R0689 Other abnormalities of breathing: Secondary | ICD-10-CM | POA: Diagnosis not present

## 2020-08-04 DIAGNOSIS — Z743 Need for continuous supervision: Secondary | ICD-10-CM | POA: Diagnosis not present

## 2020-08-04 DIAGNOSIS — J80 Acute respiratory distress syndrome: Secondary | ICD-10-CM | POA: Diagnosis not present

## 2020-08-04 DIAGNOSIS — R0602 Shortness of breath: Secondary | ICD-10-CM | POA: Diagnosis not present

## 2020-08-04 DIAGNOSIS — F319 Bipolar disorder, unspecified: Secondary | ICD-10-CM | POA: Diagnosis present

## 2020-08-04 DIAGNOSIS — Z66 Do not resuscitate: Secondary | ICD-10-CM | POA: Diagnosis present

## 2020-08-04 DIAGNOSIS — I5032 Chronic diastolic (congestive) heart failure: Secondary | ICD-10-CM | POA: Diagnosis not present

## 2020-08-04 DIAGNOSIS — R404 Transient alteration of awareness: Secondary | ICD-10-CM | POA: Diagnosis not present

## 2020-08-04 DIAGNOSIS — M6281 Muscle weakness (generalized): Secondary | ICD-10-CM | POA: Diagnosis not present

## 2020-08-04 DIAGNOSIS — J811 Chronic pulmonary edema: Secondary | ICD-10-CM | POA: Diagnosis not present

## 2020-08-04 DIAGNOSIS — J9383 Other pneumothorax: Secondary | ICD-10-CM | POA: Diagnosis not present

## 2020-08-04 DIAGNOSIS — E785 Hyperlipidemia, unspecified: Secondary | ICD-10-CM | POA: Diagnosis present

## 2020-08-04 DIAGNOSIS — N179 Acute kidney failure, unspecified: Secondary | ICD-10-CM | POA: Diagnosis present

## 2020-08-04 DIAGNOSIS — B952 Enterococcus as the cause of diseases classified elsewhere: Secondary | ICD-10-CM | POA: Diagnosis present

## 2020-08-04 DIAGNOSIS — S2241XD Multiple fractures of ribs, right side, subsequent encounter for fracture with routine healing: Secondary | ICD-10-CM | POA: Diagnosis not present

## 2020-08-04 DIAGNOSIS — J9621 Acute and chronic respiratory failure with hypoxia: Secondary | ICD-10-CM | POA: Diagnosis present

## 2020-08-04 DIAGNOSIS — J449 Chronic obstructive pulmonary disease, unspecified: Secondary | ICD-10-CM | POA: Diagnosis present

## 2020-08-04 DIAGNOSIS — F3130 Bipolar disorder, current episode depressed, mild or moderate severity, unspecified: Secondary | ICD-10-CM | POA: Diagnosis not present

## 2020-08-04 DIAGNOSIS — J9690 Respiratory failure, unspecified, unspecified whether with hypoxia or hypercapnia: Secondary | ICD-10-CM | POA: Diagnosis not present

## 2020-08-04 DIAGNOSIS — I509 Heart failure, unspecified: Secondary | ICD-10-CM | POA: Diagnosis not present

## 2020-08-04 DIAGNOSIS — Z9181 History of falling: Secondary | ICD-10-CM | POA: Diagnosis not present

## 2020-08-04 DIAGNOSIS — J9622 Acute and chronic respiratory failure with hypercapnia: Secondary | ICD-10-CM | POA: Diagnosis present

## 2020-08-04 DIAGNOSIS — N189 Chronic kidney disease, unspecified: Secondary | ICD-10-CM | POA: Diagnosis present

## 2020-08-04 DIAGNOSIS — I5023 Acute on chronic systolic (congestive) heart failure: Secondary | ICD-10-CM | POA: Diagnosis present

## 2020-08-04 DIAGNOSIS — J939 Pneumothorax, unspecified: Secondary | ICD-10-CM | POA: Diagnosis not present

## 2020-08-04 DIAGNOSIS — S2241XA Multiple fractures of ribs, right side, initial encounter for closed fracture: Secondary | ICD-10-CM | POA: Diagnosis not present

## 2020-08-04 DIAGNOSIS — I700 Atherosclerosis of aorta: Secondary | ICD-10-CM | POA: Diagnosis not present

## 2020-08-04 DIAGNOSIS — R69 Illness, unspecified: Secondary | ICD-10-CM | POA: Diagnosis not present

## 2020-08-04 DIAGNOSIS — R2689 Other abnormalities of gait and mobility: Secondary | ICD-10-CM | POA: Diagnosis not present

## 2020-08-04 DIAGNOSIS — I130 Hypertensive heart and chronic kidney disease with heart failure and stage 1 through stage 4 chronic kidney disease, or unspecified chronic kidney disease: Secondary | ICD-10-CM | POA: Diagnosis present

## 2020-08-04 DIAGNOSIS — N390 Urinary tract infection, site not specified: Secondary | ICD-10-CM | POA: Diagnosis present

## 2020-08-04 DIAGNOSIS — Z299 Encounter for prophylactic measures, unspecified: Secondary | ICD-10-CM | POA: Diagnosis not present

## 2020-08-04 DIAGNOSIS — Z87891 Personal history of nicotine dependence: Secondary | ICD-10-CM | POA: Diagnosis not present

## 2020-08-04 DIAGNOSIS — F0390 Unspecified dementia without behavioral disturbance: Secondary | ICD-10-CM | POA: Diagnosis not present

## 2020-08-04 DIAGNOSIS — W19XXXA Unspecified fall, initial encounter: Secondary | ICD-10-CM | POA: Diagnosis not present

## 2020-08-04 DIAGNOSIS — E1122 Type 2 diabetes mellitus with diabetic chronic kidney disease: Secondary | ICD-10-CM | POA: Diagnosis present

## 2020-08-04 DIAGNOSIS — Z20822 Contact with and (suspected) exposure to covid-19: Secondary | ICD-10-CM | POA: Diagnosis present

## 2020-08-04 DIAGNOSIS — R0902 Hypoxemia: Secondary | ICD-10-CM | POA: Diagnosis not present

## 2020-08-04 DIAGNOSIS — E119 Type 2 diabetes mellitus without complications: Secondary | ICD-10-CM | POA: Diagnosis not present

## 2020-08-05 DIAGNOSIS — J449 Chronic obstructive pulmonary disease, unspecified: Secondary | ICD-10-CM | POA: Diagnosis not present

## 2020-08-05 DIAGNOSIS — Z299 Encounter for prophylactic measures, unspecified: Secondary | ICD-10-CM | POA: Diagnosis not present

## 2020-08-05 DIAGNOSIS — F3130 Bipolar disorder, current episode depressed, mild or moderate severity, unspecified: Secondary | ICD-10-CM | POA: Diagnosis not present

## 2020-08-05 DIAGNOSIS — I5032 Chronic diastolic (congestive) heart failure: Secondary | ICD-10-CM | POA: Diagnosis not present

## 2020-08-05 DIAGNOSIS — F319 Bipolar disorder, unspecified: Secondary | ICD-10-CM | POA: Diagnosis not present

## 2020-08-11 DIAGNOSIS — F3181 Bipolar II disorder: Secondary | ICD-10-CM | POA: Diagnosis not present

## 2020-08-11 DIAGNOSIS — I5023 Acute on chronic systolic (congestive) heart failure: Secondary | ICD-10-CM | POA: Diagnosis not present

## 2020-08-11 DIAGNOSIS — J9690 Respiratory failure, unspecified, unspecified whether with hypoxia or hypercapnia: Secondary | ICD-10-CM | POA: Diagnosis not present

## 2020-08-11 DIAGNOSIS — R2689 Other abnormalities of gait and mobility: Secondary | ICD-10-CM | POA: Diagnosis not present

## 2020-08-11 DIAGNOSIS — N179 Acute kidney failure, unspecified: Secondary | ICD-10-CM | POA: Diagnosis not present

## 2020-08-11 DIAGNOSIS — R0602 Shortness of breath: Secondary | ICD-10-CM | POA: Diagnosis not present

## 2020-08-11 DIAGNOSIS — J80 Acute respiratory distress syndrome: Secondary | ICD-10-CM | POA: Diagnosis not present

## 2020-08-11 DIAGNOSIS — I509 Heart failure, unspecified: Secondary | ICD-10-CM | POA: Diagnosis not present

## 2020-08-11 DIAGNOSIS — Z9181 History of falling: Secondary | ICD-10-CM | POA: Diagnosis not present

## 2020-08-11 DIAGNOSIS — E119 Type 2 diabetes mellitus without complications: Secondary | ICD-10-CM | POA: Diagnosis not present

## 2020-08-11 DIAGNOSIS — R410 Disorientation, unspecified: Secondary | ICD-10-CM | POA: Diagnosis not present

## 2020-08-11 DIAGNOSIS — J449 Chronic obstructive pulmonary disease, unspecified: Secondary | ICD-10-CM | POA: Diagnosis present

## 2020-08-11 DIAGNOSIS — E785 Hyperlipidemia, unspecified: Secondary | ICD-10-CM | POA: Diagnosis present

## 2020-08-11 DIAGNOSIS — R41841 Cognitive communication deficit: Secondary | ICD-10-CM | POA: Diagnosis not present

## 2020-08-11 DIAGNOSIS — E1122 Type 2 diabetes mellitus with diabetic chronic kidney disease: Secondary | ICD-10-CM | POA: Diagnosis not present

## 2020-08-11 DIAGNOSIS — R531 Weakness: Secondary | ICD-10-CM | POA: Diagnosis not present

## 2020-08-11 DIAGNOSIS — M6281 Muscle weakness (generalized): Secondary | ICD-10-CM | POA: Diagnosis not present

## 2020-08-11 DIAGNOSIS — J811 Chronic pulmonary edema: Secondary | ICD-10-CM | POA: Diagnosis not present

## 2020-08-11 DIAGNOSIS — S2241XD Multiple fractures of ribs, right side, subsequent encounter for fracture with routine healing: Secondary | ICD-10-CM | POA: Diagnosis not present

## 2020-08-11 DIAGNOSIS — R0689 Other abnormalities of breathing: Secondary | ICD-10-CM | POA: Diagnosis not present

## 2020-08-11 DIAGNOSIS — F319 Bipolar disorder, unspecified: Secondary | ICD-10-CM | POA: Diagnosis present

## 2020-08-11 DIAGNOSIS — I10 Essential (primary) hypertension: Secondary | ICD-10-CM | POA: Diagnosis not present

## 2020-08-11 DIAGNOSIS — F0390 Unspecified dementia without behavioral disturbance: Secondary | ICD-10-CM | POA: Diagnosis not present

## 2020-08-11 DIAGNOSIS — N189 Chronic kidney disease, unspecified: Secondary | ICD-10-CM | POA: Diagnosis present

## 2020-08-11 DIAGNOSIS — J9621 Acute and chronic respiratory failure with hypoxia: Secondary | ICD-10-CM | POA: Diagnosis present

## 2020-08-11 DIAGNOSIS — R0902 Hypoxemia: Secondary | ICD-10-CM | POA: Diagnosis not present

## 2020-08-11 DIAGNOSIS — R404 Transient alteration of awareness: Secondary | ICD-10-CM | POA: Diagnosis not present

## 2020-08-11 DIAGNOSIS — N390 Urinary tract infection, site not specified: Secondary | ICD-10-CM | POA: Diagnosis not present

## 2020-08-11 DIAGNOSIS — Z87891 Personal history of nicotine dependence: Secondary | ICD-10-CM | POA: Diagnosis not present

## 2020-08-11 DIAGNOSIS — I130 Hypertensive heart and chronic kidney disease with heart failure and stage 1 through stage 4 chronic kidney disease, or unspecified chronic kidney disease: Secondary | ICD-10-CM | POA: Diagnosis not present

## 2020-08-11 DIAGNOSIS — S2249XA Multiple fractures of ribs, unspecified side, initial encounter for closed fracture: Secondary | ICD-10-CM | POA: Diagnosis not present

## 2020-08-11 DIAGNOSIS — J9622 Acute and chronic respiratory failure with hypercapnia: Secondary | ICD-10-CM | POA: Diagnosis present

## 2020-08-11 DIAGNOSIS — Z66 Do not resuscitate: Secondary | ICD-10-CM | POA: Diagnosis present

## 2020-08-11 DIAGNOSIS — B952 Enterococcus as the cause of diseases classified elsewhere: Secondary | ICD-10-CM | POA: Diagnosis present

## 2020-08-11 DIAGNOSIS — Z20822 Contact with and (suspected) exposure to covid-19: Secondary | ICD-10-CM | POA: Diagnosis not present

## 2020-08-12 DIAGNOSIS — E119 Type 2 diabetes mellitus without complications: Secondary | ICD-10-CM | POA: Diagnosis not present

## 2020-08-12 DIAGNOSIS — Z9181 History of falling: Secondary | ICD-10-CM | POA: Diagnosis not present

## 2020-08-12 DIAGNOSIS — R2689 Other abnormalities of gait and mobility: Secondary | ICD-10-CM | POA: Diagnosis not present

## 2020-08-12 DIAGNOSIS — Z87891 Personal history of nicotine dependence: Secondary | ICD-10-CM | POA: Diagnosis not present

## 2020-08-12 DIAGNOSIS — R0602 Shortness of breath: Secondary | ICD-10-CM | POA: Diagnosis not present

## 2020-08-12 DIAGNOSIS — I130 Hypertensive heart and chronic kidney disease with heart failure and stage 1 through stage 4 chronic kidney disease, or unspecified chronic kidney disease: Secondary | ICD-10-CM | POA: Diagnosis present

## 2020-08-12 DIAGNOSIS — Z66 Do not resuscitate: Secondary | ICD-10-CM | POA: Diagnosis present

## 2020-08-12 DIAGNOSIS — F0390 Unspecified dementia without behavioral disturbance: Secondary | ICD-10-CM | POA: Diagnosis not present

## 2020-08-12 DIAGNOSIS — S2241XD Multiple fractures of ribs, right side, subsequent encounter for fracture with routine healing: Secondary | ICD-10-CM | POA: Diagnosis not present

## 2020-08-12 DIAGNOSIS — E089 Diabetes mellitus due to underlying condition without complications: Secondary | ICD-10-CM | POA: Diagnosis not present

## 2020-08-12 DIAGNOSIS — N179 Acute kidney failure, unspecified: Secondary | ICD-10-CM | POA: Diagnosis present

## 2020-08-12 DIAGNOSIS — F3181 Bipolar II disorder: Secondary | ICD-10-CM | POA: Diagnosis not present

## 2020-08-12 DIAGNOSIS — N390 Urinary tract infection, site not specified: Secondary | ICD-10-CM | POA: Diagnosis present

## 2020-08-12 DIAGNOSIS — M6281 Muscle weakness (generalized): Secondary | ICD-10-CM | POA: Diagnosis not present

## 2020-08-12 DIAGNOSIS — J9621 Acute and chronic respiratory failure with hypoxia: Secondary | ICD-10-CM | POA: Diagnosis present

## 2020-08-12 DIAGNOSIS — J9622 Acute and chronic respiratory failure with hypercapnia: Secondary | ICD-10-CM | POA: Diagnosis present

## 2020-08-12 DIAGNOSIS — Z20822 Contact with and (suspected) exposure to covid-19: Secondary | ICD-10-CM | POA: Diagnosis present

## 2020-08-12 DIAGNOSIS — R531 Weakness: Secondary | ICD-10-CM | POA: Diagnosis not present

## 2020-08-12 DIAGNOSIS — I5023 Acute on chronic systolic (congestive) heart failure: Secondary | ICD-10-CM | POA: Diagnosis present

## 2020-08-12 DIAGNOSIS — R0902 Hypoxemia: Secondary | ICD-10-CM | POA: Diagnosis not present

## 2020-08-12 DIAGNOSIS — S2249XA Multiple fractures of ribs, unspecified side, initial encounter for closed fracture: Secondary | ICD-10-CM | POA: Diagnosis not present

## 2020-08-12 DIAGNOSIS — I10 Essential (primary) hypertension: Secondary | ICD-10-CM | POA: Diagnosis not present

## 2020-08-12 DIAGNOSIS — B952 Enterococcus as the cause of diseases classified elsewhere: Secondary | ICD-10-CM | POA: Diagnosis present

## 2020-08-12 DIAGNOSIS — E785 Hyperlipidemia, unspecified: Secondary | ICD-10-CM | POA: Diagnosis present

## 2020-08-12 DIAGNOSIS — N189 Chronic kidney disease, unspecified: Secondary | ICD-10-CM | POA: Diagnosis present

## 2020-08-12 DIAGNOSIS — E1122 Type 2 diabetes mellitus with diabetic chronic kidney disease: Secondary | ICD-10-CM | POA: Diagnosis present

## 2020-08-12 DIAGNOSIS — R410 Disorientation, unspecified: Secondary | ICD-10-CM | POA: Diagnosis not present

## 2020-08-12 DIAGNOSIS — J449 Chronic obstructive pulmonary disease, unspecified: Secondary | ICD-10-CM | POA: Diagnosis present

## 2020-08-12 DIAGNOSIS — F319 Bipolar disorder, unspecified: Secondary | ICD-10-CM | POA: Diagnosis present

## 2020-08-12 DIAGNOSIS — I509 Heart failure, unspecified: Secondary | ICD-10-CM | POA: Diagnosis not present

## 2020-08-12 DIAGNOSIS — J9690 Respiratory failure, unspecified, unspecified whether with hypoxia or hypercapnia: Secondary | ICD-10-CM | POA: Diagnosis not present

## 2020-08-12 DIAGNOSIS — R41841 Cognitive communication deficit: Secondary | ICD-10-CM | POA: Diagnosis not present

## 2020-08-18 DIAGNOSIS — Z515 Encounter for palliative care: Secondary | ICD-10-CM | POA: Diagnosis not present

## 2020-08-18 DIAGNOSIS — S2241XD Multiple fractures of ribs, right side, subsequent encounter for fracture with routine healing: Secondary | ICD-10-CM | POA: Diagnosis not present

## 2020-08-18 DIAGNOSIS — E1122 Type 2 diabetes mellitus with diabetic chronic kidney disease: Secondary | ICD-10-CM | POA: Diagnosis present

## 2020-08-18 DIAGNOSIS — I959 Hypotension, unspecified: Secondary | ICD-10-CM | POA: Diagnosis not present

## 2020-08-18 DIAGNOSIS — Z20822 Contact with and (suspected) exposure to covid-19: Secondary | ICD-10-CM | POA: Diagnosis present

## 2020-08-18 DIAGNOSIS — Z299 Encounter for prophylactic measures, unspecified: Secondary | ICD-10-CM | POA: Diagnosis not present

## 2020-08-18 DIAGNOSIS — I700 Atherosclerosis of aorta: Secondary | ICD-10-CM | POA: Diagnosis present

## 2020-08-18 DIAGNOSIS — N189 Chronic kidney disease, unspecified: Secondary | ICD-10-CM | POA: Diagnosis present

## 2020-08-18 DIAGNOSIS — N179 Acute kidney failure, unspecified: Secondary | ICD-10-CM | POA: Diagnosis present

## 2020-08-18 DIAGNOSIS — J9690 Respiratory failure, unspecified, unspecified whether with hypoxia or hypercapnia: Secondary | ICD-10-CM | POA: Diagnosis not present

## 2020-08-18 DIAGNOSIS — R531 Weakness: Secondary | ICD-10-CM | POA: Diagnosis not present

## 2020-08-18 DIAGNOSIS — F319 Bipolar disorder, unspecified: Secondary | ICD-10-CM | POA: Diagnosis present

## 2020-08-18 DIAGNOSIS — J449 Chronic obstructive pulmonary disease, unspecified: Secondary | ICD-10-CM | POA: Diagnosis present

## 2020-08-18 DIAGNOSIS — Z66 Do not resuscitate: Secondary | ICD-10-CM | POA: Diagnosis present

## 2020-08-18 DIAGNOSIS — J189 Pneumonia, unspecified organism: Secondary | ICD-10-CM | POA: Diagnosis not present

## 2020-08-18 DIAGNOSIS — B952 Enterococcus as the cause of diseases classified elsewhere: Secondary | ICD-10-CM | POA: Diagnosis present

## 2020-08-18 DIAGNOSIS — Z9181 History of falling: Secondary | ICD-10-CM | POA: Diagnosis not present

## 2020-08-18 DIAGNOSIS — R404 Transient alteration of awareness: Secondary | ICD-10-CM | POA: Diagnosis not present

## 2020-08-18 DIAGNOSIS — Z8679 Personal history of other diseases of the circulatory system: Secondary | ICD-10-CM | POA: Diagnosis not present

## 2020-08-18 DIAGNOSIS — I509 Heart failure, unspecified: Secondary | ICD-10-CM | POA: Diagnosis present

## 2020-08-18 DIAGNOSIS — I130 Hypertensive heart and chronic kidney disease with heart failure and stage 1 through stage 4 chronic kidney disease, or unspecified chronic kidney disease: Secondary | ICD-10-CM | POA: Diagnosis present

## 2020-08-18 DIAGNOSIS — R0902 Hypoxemia: Secondary | ICD-10-CM | POA: Diagnosis not present

## 2020-08-18 DIAGNOSIS — E872 Acidosis: Secondary | ICD-10-CM | POA: Diagnosis present

## 2020-08-18 DIAGNOSIS — Z87891 Personal history of nicotine dependence: Secondary | ICD-10-CM | POA: Diagnosis not present

## 2020-08-18 DIAGNOSIS — F3181 Bipolar II disorder: Secondary | ICD-10-CM | POA: Diagnosis not present

## 2020-08-18 DIAGNOSIS — E119 Type 2 diabetes mellitus without complications: Secondary | ICD-10-CM | POA: Diagnosis not present

## 2020-08-18 DIAGNOSIS — I5033 Acute on chronic diastolic (congestive) heart failure: Secondary | ICD-10-CM | POA: Diagnosis present

## 2020-08-18 DIAGNOSIS — R41841 Cognitive communication deficit: Secondary | ICD-10-CM | POA: Diagnosis not present

## 2020-08-18 DIAGNOSIS — J90 Pleural effusion, not elsewhere classified: Secondary | ICD-10-CM | POA: Diagnosis not present

## 2020-08-18 DIAGNOSIS — J9611 Chronic respiratory failure with hypoxia: Secondary | ICD-10-CM | POA: Diagnosis not present

## 2020-08-18 DIAGNOSIS — E1140 Type 2 diabetes mellitus with diabetic neuropathy, unspecified: Secondary | ICD-10-CM | POA: Diagnosis not present

## 2020-08-18 DIAGNOSIS — N390 Urinary tract infection, site not specified: Secondary | ICD-10-CM | POA: Diagnosis present

## 2020-08-18 DIAGNOSIS — J9621 Acute and chronic respiratory failure with hypoxia: Secondary | ICD-10-CM | POA: Diagnosis not present

## 2020-08-18 DIAGNOSIS — E785 Hyperlipidemia, unspecified: Secondary | ICD-10-CM | POA: Diagnosis present

## 2020-08-18 DIAGNOSIS — F0390 Unspecified dementia without behavioral disturbance: Secondary | ICD-10-CM | POA: Diagnosis not present

## 2020-08-18 DIAGNOSIS — R410 Disorientation, unspecified: Secondary | ICD-10-CM | POA: Diagnosis not present

## 2020-08-18 DIAGNOSIS — J80 Acute respiratory distress syndrome: Secondary | ICD-10-CM | POA: Diagnosis not present

## 2020-08-18 DIAGNOSIS — E089 Diabetes mellitus due to underlying condition without complications: Secondary | ICD-10-CM | POA: Diagnosis not present

## 2020-08-18 DIAGNOSIS — J9622 Acute and chronic respiratory failure with hypercapnia: Secondary | ICD-10-CM | POA: Diagnosis present

## 2020-08-18 DIAGNOSIS — J939 Pneumothorax, unspecified: Secondary | ICD-10-CM | POA: Diagnosis not present

## 2020-08-18 DIAGNOSIS — Z0000 Encounter for general adult medical examination without abnormal findings: Secondary | ICD-10-CM | POA: Diagnosis not present

## 2020-08-18 DIAGNOSIS — F32A Depression, unspecified: Secondary | ICD-10-CM | POA: Diagnosis not present

## 2020-08-18 DIAGNOSIS — M6281 Muscle weakness (generalized): Secondary | ICD-10-CM | POA: Diagnosis not present

## 2020-08-18 DIAGNOSIS — R2689 Other abnormalities of gait and mobility: Secondary | ICD-10-CM | POA: Diagnosis not present

## 2020-08-18 DIAGNOSIS — R0602 Shortness of breath: Secondary | ICD-10-CM | POA: Diagnosis not present

## 2020-08-18 DIAGNOSIS — F329 Major depressive disorder, single episode, unspecified: Secondary | ICD-10-CM | POA: Diagnosis not present

## 2020-08-25 DIAGNOSIS — E1140 Type 2 diabetes mellitus with diabetic neuropathy, unspecified: Secondary | ICD-10-CM | POA: Diagnosis not present

## 2020-08-26 ENCOUNTER — Other Ambulatory Visit: Payer: Self-pay | Admitting: *Deleted

## 2020-08-26 NOTE — Patient Outreach (Addendum)
Member screened for potential [facility] Care Management needs.  Mrs. Nordby is receiving skilled therapy at [facility].  Communication received from [facility] SW. Transition plans are to return home with spouse. Has supportive family. Children stay with her at night. Mrs. Diedrich has CAPs aide.   Mrs. Baum has history of COPD. Will continue to follow for potential [facility] needs and engage as appropriate.    Raiford Noble, MSN, RN,BSN [facility] Post Acute Care Coordinator [phone] [facility] [phone]  (Toll free office)

## 2020-08-28 DIAGNOSIS — Z299 Encounter for prophylactic measures, unspecified: Secondary | ICD-10-CM | POA: Diagnosis not present

## 2020-08-28 DIAGNOSIS — J449 Chronic obstructive pulmonary disease, unspecified: Secondary | ICD-10-CM | POA: Diagnosis not present

## 2020-08-28 DIAGNOSIS — J9611 Chronic respiratory failure with hypoxia: Secondary | ICD-10-CM | POA: Diagnosis not present

## 2020-08-28 DIAGNOSIS — Z8679 Personal history of other diseases of the circulatory system: Secondary | ICD-10-CM | POA: Diagnosis not present

## 2020-08-28 DIAGNOSIS — F319 Bipolar disorder, unspecified: Secondary | ICD-10-CM | POA: Diagnosis not present

## 2020-08-28 DIAGNOSIS — Z0000 Encounter for general adult medical examination without abnormal findings: Secondary | ICD-10-CM | POA: Diagnosis not present

## 2020-08-28 DIAGNOSIS — F32A Depression, unspecified: Secondary | ICD-10-CM | POA: Diagnosis not present

## 2020-08-28 DIAGNOSIS — F329 Major depressive disorder, single episode, unspecified: Secondary | ICD-10-CM | POA: Diagnosis not present

## 2020-08-28 DIAGNOSIS — J189 Pneumonia, unspecified organism: Secondary | ICD-10-CM | POA: Diagnosis not present

## 2020-08-29 DIAGNOSIS — I700 Atherosclerosis of aorta: Secondary | ICD-10-CM | POA: Diagnosis present

## 2020-08-29 DIAGNOSIS — I130 Hypertensive heart and chronic kidney disease with heart failure and stage 1 through stage 4 chronic kidney disease, or unspecified chronic kidney disease: Secondary | ICD-10-CM | POA: Diagnosis present

## 2020-08-29 DIAGNOSIS — I959 Hypotension, unspecified: Secondary | ICD-10-CM | POA: Diagnosis not present

## 2020-08-29 DIAGNOSIS — Z66 Do not resuscitate: Secondary | ICD-10-CM | POA: Diagnosis present

## 2020-08-29 DIAGNOSIS — I5033 Acute on chronic diastolic (congestive) heart failure: Secondary | ICD-10-CM | POA: Diagnosis present

## 2020-08-29 DIAGNOSIS — Z87891 Personal history of nicotine dependence: Secondary | ICD-10-CM | POA: Diagnosis not present

## 2020-08-29 DIAGNOSIS — Z20822 Contact with and (suspected) exposure to covid-19: Secondary | ICD-10-CM | POA: Diagnosis not present

## 2020-08-29 DIAGNOSIS — R0902 Hypoxemia: Secondary | ICD-10-CM | POA: Diagnosis not present

## 2020-08-29 DIAGNOSIS — R404 Transient alteration of awareness: Secondary | ICD-10-CM | POA: Diagnosis not present

## 2020-08-29 DIAGNOSIS — E1122 Type 2 diabetes mellitus with diabetic chronic kidney disease: Secondary | ICD-10-CM | POA: Diagnosis present

## 2020-08-29 DIAGNOSIS — I509 Heart failure, unspecified: Secondary | ICD-10-CM | POA: Diagnosis not present

## 2020-08-29 DIAGNOSIS — Z9989 Dependence on other enabling machines and devices: Secondary | ICD-10-CM | POA: Diagnosis not present

## 2020-08-29 DIAGNOSIS — N390 Urinary tract infection, site not specified: Secondary | ICD-10-CM | POA: Diagnosis present

## 2020-08-29 DIAGNOSIS — Z515 Encounter for palliative care: Secondary | ICD-10-CM | POA: Diagnosis not present

## 2020-08-29 DIAGNOSIS — E119 Type 2 diabetes mellitus without complications: Secondary | ICD-10-CM | POA: Diagnosis not present

## 2020-08-29 DIAGNOSIS — I10 Essential (primary) hypertension: Secondary | ICD-10-CM | POA: Diagnosis not present

## 2020-08-29 DIAGNOSIS — J80 Acute respiratory distress syndrome: Secondary | ICD-10-CM | POA: Diagnosis not present

## 2020-08-29 DIAGNOSIS — N179 Acute kidney failure, unspecified: Secondary | ICD-10-CM | POA: Diagnosis not present

## 2020-08-29 DIAGNOSIS — B952 Enterococcus as the cause of diseases classified elsewhere: Secondary | ICD-10-CM | POA: Diagnosis present

## 2020-08-29 DIAGNOSIS — J9622 Acute and chronic respiratory failure with hypercapnia: Secondary | ICD-10-CM | POA: Diagnosis not present

## 2020-08-29 DIAGNOSIS — R0600 Dyspnea, unspecified: Secondary | ICD-10-CM | POA: Diagnosis not present

## 2020-08-29 DIAGNOSIS — N189 Chronic kidney disease, unspecified: Secondary | ICD-10-CM | POA: Diagnosis present

## 2020-08-29 DIAGNOSIS — E785 Hyperlipidemia, unspecified: Secondary | ICD-10-CM | POA: Diagnosis present

## 2020-08-29 DIAGNOSIS — E872 Acidosis: Secondary | ICD-10-CM | POA: Diagnosis present

## 2020-08-29 DIAGNOSIS — J9621 Acute and chronic respiratory failure with hypoxia: Secondary | ICD-10-CM | POA: Diagnosis present

## 2020-08-29 DIAGNOSIS — F319 Bipolar disorder, unspecified: Secondary | ICD-10-CM | POA: Diagnosis present

## 2020-08-29 DIAGNOSIS — J449 Chronic obstructive pulmonary disease, unspecified: Secondary | ICD-10-CM | POA: Diagnosis present

## 2020-08-29 DIAGNOSIS — J90 Pleural effusion, not elsewhere classified: Secondary | ICD-10-CM | POA: Diagnosis not present

## 2020-08-29 DIAGNOSIS — Z7401 Bed confinement status: Secondary | ICD-10-CM | POA: Diagnosis not present

## 2020-08-29 DIAGNOSIS — J9611 Chronic respiratory failure with hypoxia: Secondary | ICD-10-CM | POA: Diagnosis not present

## 2020-08-29 DIAGNOSIS — R0602 Shortness of breath: Secondary | ICD-10-CM | POA: Diagnosis not present

## 2020-08-29 DIAGNOSIS — J939 Pneumothorax, unspecified: Secondary | ICD-10-CM | POA: Diagnosis not present

## 2020-08-29 DIAGNOSIS — J9612 Chronic respiratory failure with hypercapnia: Secondary | ICD-10-CM | POA: Diagnosis not present

## 2020-08-29 DIAGNOSIS — R531 Weakness: Secondary | ICD-10-CM | POA: Diagnosis not present

## 2020-09-03 DIAGNOSIS — J90 Pleural effusion, not elsewhere classified: Secondary | ICD-10-CM | POA: Diagnosis not present

## 2020-09-03 DIAGNOSIS — R0600 Dyspnea, unspecified: Secondary | ICD-10-CM | POA: Diagnosis not present

## 2020-09-12 DIAGNOSIS — I2510 Atherosclerotic heart disease of native coronary artery without angina pectoris: Secondary | ICD-10-CM | POA: Diagnosis not present

## 2020-09-12 DIAGNOSIS — M069 Rheumatoid arthritis, unspecified: Secondary | ICD-10-CM | POA: Diagnosis not present

## 2020-09-12 DIAGNOSIS — J9622 Acute and chronic respiratory failure with hypercapnia: Secondary | ICD-10-CM | POA: Diagnosis not present

## 2020-09-12 DIAGNOSIS — M549 Dorsalgia, unspecified: Secondary | ICD-10-CM | POA: Diagnosis not present

## 2020-09-12 DIAGNOSIS — R1310 Dysphagia, unspecified: Secondary | ICD-10-CM | POA: Diagnosis not present

## 2020-09-12 DIAGNOSIS — G8929 Other chronic pain: Secondary | ICD-10-CM | POA: Diagnosis not present

## 2020-09-12 DIAGNOSIS — Z7951 Long term (current) use of inhaled steroids: Secondary | ICD-10-CM | POA: Diagnosis not present

## 2020-09-12 DIAGNOSIS — J9621 Acute and chronic respiratory failure with hypoxia: Secondary | ICD-10-CM | POA: Diagnosis not present

## 2020-09-12 DIAGNOSIS — I700 Atherosclerosis of aorta: Secondary | ICD-10-CM | POA: Diagnosis not present

## 2020-09-12 DIAGNOSIS — E1140 Type 2 diabetes mellitus with diabetic neuropathy, unspecified: Secondary | ICD-10-CM | POA: Diagnosis not present

## 2020-09-12 DIAGNOSIS — J449 Chronic obstructive pulmonary disease, unspecified: Secondary | ICD-10-CM | POA: Diagnosis not present

## 2020-09-12 DIAGNOSIS — Z9981 Dependence on supplemental oxygen: Secondary | ICD-10-CM | POA: Diagnosis not present

## 2020-09-12 DIAGNOSIS — E785 Hyperlipidemia, unspecified: Secondary | ICD-10-CM | POA: Diagnosis not present

## 2020-09-12 DIAGNOSIS — E1122 Type 2 diabetes mellitus with diabetic chronic kidney disease: Secondary | ICD-10-CM | POA: Diagnosis not present

## 2020-09-12 DIAGNOSIS — F319 Bipolar disorder, unspecified: Secondary | ICD-10-CM | POA: Diagnosis not present

## 2020-09-12 DIAGNOSIS — J939 Pneumothorax, unspecified: Secondary | ICD-10-CM | POA: Diagnosis not present

## 2020-09-12 DIAGNOSIS — I130 Hypertensive heart and chronic kidney disease with heart failure and stage 1 through stage 4 chronic kidney disease, or unspecified chronic kidney disease: Secondary | ICD-10-CM | POA: Diagnosis not present

## 2020-09-12 DIAGNOSIS — N189 Chronic kidney disease, unspecified: Secondary | ICD-10-CM | POA: Diagnosis not present

## 2020-09-12 DIAGNOSIS — I5033 Acute on chronic diastolic (congestive) heart failure: Secondary | ICD-10-CM | POA: Diagnosis not present

## 2020-09-12 DIAGNOSIS — Z87891 Personal history of nicotine dependence: Secondary | ICD-10-CM | POA: Diagnosis not present

## 2020-09-12 DIAGNOSIS — Z7984 Long term (current) use of oral hypoglycemic drugs: Secondary | ICD-10-CM | POA: Diagnosis not present

## 2020-09-16 DIAGNOSIS — J9622 Acute and chronic respiratory failure with hypercapnia: Secondary | ICD-10-CM | POA: Diagnosis not present

## 2020-09-16 DIAGNOSIS — J9621 Acute and chronic respiratory failure with hypoxia: Secondary | ICD-10-CM | POA: Diagnosis not present

## 2020-09-16 DIAGNOSIS — I5033 Acute on chronic diastolic (congestive) heart failure: Secondary | ICD-10-CM | POA: Diagnosis not present

## 2020-09-16 DIAGNOSIS — E1122 Type 2 diabetes mellitus with diabetic chronic kidney disease: Secondary | ICD-10-CM | POA: Diagnosis not present

## 2020-09-16 DIAGNOSIS — I130 Hypertensive heart and chronic kidney disease with heart failure and stage 1 through stage 4 chronic kidney disease, or unspecified chronic kidney disease: Secondary | ICD-10-CM | POA: Diagnosis not present

## 2020-09-16 DIAGNOSIS — N189 Chronic kidney disease, unspecified: Secondary | ICD-10-CM | POA: Diagnosis not present

## 2020-09-17 DIAGNOSIS — I130 Hypertensive heart and chronic kidney disease with heart failure and stage 1 through stage 4 chronic kidney disease, or unspecified chronic kidney disease: Secondary | ICD-10-CM | POA: Diagnosis not present

## 2020-09-17 DIAGNOSIS — E1122 Type 2 diabetes mellitus with diabetic chronic kidney disease: Secondary | ICD-10-CM | POA: Diagnosis not present

## 2020-09-17 DIAGNOSIS — I5033 Acute on chronic diastolic (congestive) heart failure: Secondary | ICD-10-CM | POA: Diagnosis not present

## 2020-09-17 DIAGNOSIS — J9622 Acute and chronic respiratory failure with hypercapnia: Secondary | ICD-10-CM | POA: Diagnosis not present

## 2020-09-17 DIAGNOSIS — N189 Chronic kidney disease, unspecified: Secondary | ICD-10-CM | POA: Diagnosis not present

## 2020-09-17 DIAGNOSIS — J9621 Acute and chronic respiratory failure with hypoxia: Secondary | ICD-10-CM | POA: Diagnosis not present

## 2020-09-18 DIAGNOSIS — F319 Bipolar disorder, unspecified: Secondary | ICD-10-CM | POA: Diagnosis not present

## 2020-09-18 DIAGNOSIS — I5033 Acute on chronic diastolic (congestive) heart failure: Secondary | ICD-10-CM | POA: Diagnosis not present

## 2020-09-18 DIAGNOSIS — I5032 Chronic diastolic (congestive) heart failure: Secondary | ICD-10-CM | POA: Diagnosis not present

## 2020-09-18 DIAGNOSIS — E1122 Type 2 diabetes mellitus with diabetic chronic kidney disease: Secondary | ICD-10-CM | POA: Diagnosis not present

## 2020-09-18 DIAGNOSIS — I130 Hypertensive heart and chronic kidney disease with heart failure and stage 1 through stage 4 chronic kidney disease, or unspecified chronic kidney disease: Secondary | ICD-10-CM | POA: Diagnosis not present

## 2020-09-18 DIAGNOSIS — J9622 Acute and chronic respiratory failure with hypercapnia: Secondary | ICD-10-CM | POA: Diagnosis not present

## 2020-09-18 DIAGNOSIS — Z299 Encounter for prophylactic measures, unspecified: Secondary | ICD-10-CM | POA: Diagnosis not present

## 2020-09-18 DIAGNOSIS — J9621 Acute and chronic respiratory failure with hypoxia: Secondary | ICD-10-CM | POA: Diagnosis not present

## 2020-09-18 DIAGNOSIS — N189 Chronic kidney disease, unspecified: Secondary | ICD-10-CM | POA: Diagnosis not present

## 2020-09-18 DIAGNOSIS — E1165 Type 2 diabetes mellitus with hyperglycemia: Secondary | ICD-10-CM | POA: Diagnosis not present

## 2020-09-18 DIAGNOSIS — J449 Chronic obstructive pulmonary disease, unspecified: Secondary | ICD-10-CM | POA: Diagnosis not present

## 2020-09-19 DIAGNOSIS — E1122 Type 2 diabetes mellitus with diabetic chronic kidney disease: Secondary | ICD-10-CM | POA: Diagnosis not present

## 2020-09-19 DIAGNOSIS — J9622 Acute and chronic respiratory failure with hypercapnia: Secondary | ICD-10-CM | POA: Diagnosis not present

## 2020-09-19 DIAGNOSIS — I130 Hypertensive heart and chronic kidney disease with heart failure and stage 1 through stage 4 chronic kidney disease, or unspecified chronic kidney disease: Secondary | ICD-10-CM | POA: Diagnosis not present

## 2020-09-19 DIAGNOSIS — I5033 Acute on chronic diastolic (congestive) heart failure: Secondary | ICD-10-CM | POA: Diagnosis not present

## 2020-09-19 DIAGNOSIS — N189 Chronic kidney disease, unspecified: Secondary | ICD-10-CM | POA: Diagnosis not present

## 2020-09-19 DIAGNOSIS — J9621 Acute and chronic respiratory failure with hypoxia: Secondary | ICD-10-CM | POA: Diagnosis not present

## 2020-09-22 DIAGNOSIS — N189 Chronic kidney disease, unspecified: Secondary | ICD-10-CM | POA: Diagnosis not present

## 2020-09-22 DIAGNOSIS — J9622 Acute and chronic respiratory failure with hypercapnia: Secondary | ICD-10-CM | POA: Diagnosis not present

## 2020-09-22 DIAGNOSIS — E1122 Type 2 diabetes mellitus with diabetic chronic kidney disease: Secondary | ICD-10-CM | POA: Diagnosis not present

## 2020-09-22 DIAGNOSIS — F32A Depression, unspecified: Secondary | ICD-10-CM | POA: Diagnosis not present

## 2020-09-22 DIAGNOSIS — I130 Hypertensive heart and chronic kidney disease with heart failure and stage 1 through stage 4 chronic kidney disease, or unspecified chronic kidney disease: Secondary | ICD-10-CM | POA: Diagnosis not present

## 2020-09-22 DIAGNOSIS — J9621 Acute and chronic respiratory failure with hypoxia: Secondary | ICD-10-CM | POA: Diagnosis not present

## 2020-09-22 DIAGNOSIS — I5033 Acute on chronic diastolic (congestive) heart failure: Secondary | ICD-10-CM | POA: Diagnosis not present

## 2020-09-22 DIAGNOSIS — E785 Hyperlipidemia, unspecified: Secondary | ICD-10-CM | POA: Diagnosis not present

## 2020-09-23 DIAGNOSIS — J9621 Acute and chronic respiratory failure with hypoxia: Secondary | ICD-10-CM | POA: Diagnosis not present

## 2020-09-23 DIAGNOSIS — I5033 Acute on chronic diastolic (congestive) heart failure: Secondary | ICD-10-CM | POA: Diagnosis not present

## 2020-09-23 DIAGNOSIS — I130 Hypertensive heart and chronic kidney disease with heart failure and stage 1 through stage 4 chronic kidney disease, or unspecified chronic kidney disease: Secondary | ICD-10-CM | POA: Diagnosis not present

## 2020-09-23 DIAGNOSIS — N189 Chronic kidney disease, unspecified: Secondary | ICD-10-CM | POA: Diagnosis not present

## 2020-09-23 DIAGNOSIS — E1122 Type 2 diabetes mellitus with diabetic chronic kidney disease: Secondary | ICD-10-CM | POA: Diagnosis not present

## 2020-09-23 DIAGNOSIS — J9622 Acute and chronic respiratory failure with hypercapnia: Secondary | ICD-10-CM | POA: Diagnosis not present

## 2020-09-24 DIAGNOSIS — J9621 Acute and chronic respiratory failure with hypoxia: Secondary | ICD-10-CM | POA: Diagnosis not present

## 2020-09-24 DIAGNOSIS — J9622 Acute and chronic respiratory failure with hypercapnia: Secondary | ICD-10-CM | POA: Diagnosis not present

## 2020-09-24 DIAGNOSIS — N189 Chronic kidney disease, unspecified: Secondary | ICD-10-CM | POA: Diagnosis not present

## 2020-09-24 DIAGNOSIS — E1122 Type 2 diabetes mellitus with diabetic chronic kidney disease: Secondary | ICD-10-CM | POA: Diagnosis not present

## 2020-09-24 DIAGNOSIS — I5033 Acute on chronic diastolic (congestive) heart failure: Secondary | ICD-10-CM | POA: Diagnosis not present

## 2020-09-24 DIAGNOSIS — I130 Hypertensive heart and chronic kidney disease with heart failure and stage 1 through stage 4 chronic kidney disease, or unspecified chronic kidney disease: Secondary | ICD-10-CM | POA: Diagnosis not present

## 2020-09-25 DIAGNOSIS — J9621 Acute and chronic respiratory failure with hypoxia: Secondary | ICD-10-CM | POA: Diagnosis not present

## 2020-09-25 DIAGNOSIS — E1122 Type 2 diabetes mellitus with diabetic chronic kidney disease: Secondary | ICD-10-CM | POA: Diagnosis not present

## 2020-09-25 DIAGNOSIS — I130 Hypertensive heart and chronic kidney disease with heart failure and stage 1 through stage 4 chronic kidney disease, or unspecified chronic kidney disease: Secondary | ICD-10-CM | POA: Diagnosis not present

## 2020-09-25 DIAGNOSIS — N189 Chronic kidney disease, unspecified: Secondary | ICD-10-CM | POA: Diagnosis not present

## 2020-09-25 DIAGNOSIS — J9622 Acute and chronic respiratory failure with hypercapnia: Secondary | ICD-10-CM | POA: Diagnosis not present

## 2020-09-25 DIAGNOSIS — I5033 Acute on chronic diastolic (congestive) heart failure: Secondary | ICD-10-CM | POA: Diagnosis not present

## 2020-09-29 DIAGNOSIS — J9621 Acute and chronic respiratory failure with hypoxia: Secondary | ICD-10-CM | POA: Diagnosis not present

## 2020-09-29 DIAGNOSIS — N189 Chronic kidney disease, unspecified: Secondary | ICD-10-CM | POA: Diagnosis not present

## 2020-09-29 DIAGNOSIS — I5033 Acute on chronic diastolic (congestive) heart failure: Secondary | ICD-10-CM | POA: Diagnosis not present

## 2020-09-29 DIAGNOSIS — I130 Hypertensive heart and chronic kidney disease with heart failure and stage 1 through stage 4 chronic kidney disease, or unspecified chronic kidney disease: Secondary | ICD-10-CM | POA: Diagnosis not present

## 2020-09-29 DIAGNOSIS — E1122 Type 2 diabetes mellitus with diabetic chronic kidney disease: Secondary | ICD-10-CM | POA: Diagnosis not present

## 2020-09-29 DIAGNOSIS — J9622 Acute and chronic respiratory failure with hypercapnia: Secondary | ICD-10-CM | POA: Diagnosis not present

## 2020-09-30 DIAGNOSIS — I5033 Acute on chronic diastolic (congestive) heart failure: Secondary | ICD-10-CM | POA: Diagnosis not present

## 2020-09-30 DIAGNOSIS — J9622 Acute and chronic respiratory failure with hypercapnia: Secondary | ICD-10-CM | POA: Diagnosis not present

## 2020-09-30 DIAGNOSIS — I130 Hypertensive heart and chronic kidney disease with heart failure and stage 1 through stage 4 chronic kidney disease, or unspecified chronic kidney disease: Secondary | ICD-10-CM | POA: Diagnosis not present

## 2020-09-30 DIAGNOSIS — E1122 Type 2 diabetes mellitus with diabetic chronic kidney disease: Secondary | ICD-10-CM | POA: Diagnosis not present

## 2020-09-30 DIAGNOSIS — J9621 Acute and chronic respiratory failure with hypoxia: Secondary | ICD-10-CM | POA: Diagnosis not present

## 2020-09-30 DIAGNOSIS — N189 Chronic kidney disease, unspecified: Secondary | ICD-10-CM | POA: Diagnosis not present

## 2020-10-01 DIAGNOSIS — J9621 Acute and chronic respiratory failure with hypoxia: Secondary | ICD-10-CM | POA: Diagnosis not present

## 2020-10-01 DIAGNOSIS — I5033 Acute on chronic diastolic (congestive) heart failure: Secondary | ICD-10-CM | POA: Diagnosis not present

## 2020-10-01 DIAGNOSIS — J9622 Acute and chronic respiratory failure with hypercapnia: Secondary | ICD-10-CM | POA: Diagnosis not present

## 2020-10-01 DIAGNOSIS — I130 Hypertensive heart and chronic kidney disease with heart failure and stage 1 through stage 4 chronic kidney disease, or unspecified chronic kidney disease: Secondary | ICD-10-CM | POA: Diagnosis not present

## 2020-10-01 DIAGNOSIS — N189 Chronic kidney disease, unspecified: Secondary | ICD-10-CM | POA: Diagnosis not present

## 2020-10-01 DIAGNOSIS — E1122 Type 2 diabetes mellitus with diabetic chronic kidney disease: Secondary | ICD-10-CM | POA: Diagnosis not present

## 2020-10-02 DIAGNOSIS — N189 Chronic kidney disease, unspecified: Secondary | ICD-10-CM | POA: Diagnosis not present

## 2020-10-02 DIAGNOSIS — I5033 Acute on chronic diastolic (congestive) heart failure: Secondary | ICD-10-CM | POA: Diagnosis not present

## 2020-10-02 DIAGNOSIS — E1122 Type 2 diabetes mellitus with diabetic chronic kidney disease: Secondary | ICD-10-CM | POA: Diagnosis not present

## 2020-10-02 DIAGNOSIS — I130 Hypertensive heart and chronic kidney disease with heart failure and stage 1 through stage 4 chronic kidney disease, or unspecified chronic kidney disease: Secondary | ICD-10-CM | POA: Diagnosis not present

## 2020-10-02 DIAGNOSIS — J9621 Acute and chronic respiratory failure with hypoxia: Secondary | ICD-10-CM | POA: Diagnosis not present

## 2020-10-02 DIAGNOSIS — J9622 Acute and chronic respiratory failure with hypercapnia: Secondary | ICD-10-CM | POA: Diagnosis not present

## 2020-10-06 DIAGNOSIS — I5033 Acute on chronic diastolic (congestive) heart failure: Secondary | ICD-10-CM | POA: Diagnosis not present

## 2020-10-06 DIAGNOSIS — N189 Chronic kidney disease, unspecified: Secondary | ICD-10-CM | POA: Diagnosis not present

## 2020-10-06 DIAGNOSIS — I130 Hypertensive heart and chronic kidney disease with heart failure and stage 1 through stage 4 chronic kidney disease, or unspecified chronic kidney disease: Secondary | ICD-10-CM | POA: Diagnosis not present

## 2020-10-06 DIAGNOSIS — J9622 Acute and chronic respiratory failure with hypercapnia: Secondary | ICD-10-CM | POA: Diagnosis not present

## 2020-10-06 DIAGNOSIS — J9621 Acute and chronic respiratory failure with hypoxia: Secondary | ICD-10-CM | POA: Diagnosis not present

## 2020-10-06 DIAGNOSIS — E1122 Type 2 diabetes mellitus with diabetic chronic kidney disease: Secondary | ICD-10-CM | POA: Diagnosis not present

## 2020-10-07 DIAGNOSIS — J9622 Acute and chronic respiratory failure with hypercapnia: Secondary | ICD-10-CM | POA: Diagnosis not present

## 2020-10-07 DIAGNOSIS — E1122 Type 2 diabetes mellitus with diabetic chronic kidney disease: Secondary | ICD-10-CM | POA: Diagnosis not present

## 2020-10-07 DIAGNOSIS — N189 Chronic kidney disease, unspecified: Secondary | ICD-10-CM | POA: Diagnosis not present

## 2020-10-07 DIAGNOSIS — I130 Hypertensive heart and chronic kidney disease with heart failure and stage 1 through stage 4 chronic kidney disease, or unspecified chronic kidney disease: Secondary | ICD-10-CM | POA: Diagnosis not present

## 2020-10-07 DIAGNOSIS — J9621 Acute and chronic respiratory failure with hypoxia: Secondary | ICD-10-CM | POA: Diagnosis not present

## 2020-10-07 DIAGNOSIS — I5033 Acute on chronic diastolic (congestive) heart failure: Secondary | ICD-10-CM | POA: Diagnosis not present

## 2020-10-08 DIAGNOSIS — Z87891 Personal history of nicotine dependence: Secondary | ICD-10-CM | POA: Diagnosis not present

## 2020-10-08 DIAGNOSIS — I10 Essential (primary) hypertension: Secondary | ICD-10-CM | POA: Diagnosis not present

## 2020-10-08 DIAGNOSIS — J449 Chronic obstructive pulmonary disease, unspecified: Secondary | ICD-10-CM | POA: Diagnosis not present

## 2020-10-08 DIAGNOSIS — R062 Wheezing: Secondary | ICD-10-CM | POA: Diagnosis not present

## 2020-10-08 DIAGNOSIS — J9602 Acute respiratory failure with hypercapnia: Secondary | ICD-10-CM | POA: Diagnosis not present

## 2020-10-08 DIAGNOSIS — Z880 Allergy status to penicillin: Secondary | ICD-10-CM | POA: Diagnosis not present

## 2020-10-08 DIAGNOSIS — I959 Hypotension, unspecified: Secondary | ICD-10-CM | POA: Diagnosis not present

## 2020-10-08 DIAGNOSIS — J80 Acute respiratory distress syndrome: Secondary | ICD-10-CM | POA: Diagnosis not present

## 2020-10-08 DIAGNOSIS — B9689 Other specified bacterial agents as the cause of diseases classified elsewhere: Secondary | ICD-10-CM | POA: Diagnosis not present

## 2020-10-08 DIAGNOSIS — J9601 Acute respiratory failure with hypoxia: Secondary | ICD-10-CM | POA: Diagnosis not present

## 2020-10-08 DIAGNOSIS — B952 Enterococcus as the cause of diseases classified elsewhere: Secondary | ICD-10-CM | POA: Diagnosis not present

## 2020-10-08 DIAGNOSIS — R0902 Hypoxemia: Secondary | ICD-10-CM | POA: Diagnosis not present

## 2020-10-08 DIAGNOSIS — Z20822 Contact with and (suspected) exposure to covid-19: Secondary | ICD-10-CM | POA: Diagnosis not present

## 2020-10-08 DIAGNOSIS — N3000 Acute cystitis without hematuria: Secondary | ICD-10-CM | POA: Diagnosis not present

## 2020-10-08 DIAGNOSIS — J189 Pneumonia, unspecified organism: Secondary | ICD-10-CM | POA: Diagnosis not present

## 2020-10-08 DIAGNOSIS — J441 Chronic obstructive pulmonary disease with (acute) exacerbation: Secondary | ICD-10-CM | POA: Diagnosis present

## 2020-10-08 DIAGNOSIS — I2510 Atherosclerotic heart disease of native coronary artery without angina pectoris: Secondary | ICD-10-CM | POA: Diagnosis present

## 2020-10-08 DIAGNOSIS — Z792 Long term (current) use of antibiotics: Secondary | ICD-10-CM | POA: Diagnosis not present

## 2020-10-08 DIAGNOSIS — I5023 Acute on chronic systolic (congestive) heart failure: Secondary | ICD-10-CM | POA: Diagnosis present

## 2020-10-08 DIAGNOSIS — I130 Hypertensive heart and chronic kidney disease with heart failure and stage 1 through stage 4 chronic kidney disease, or unspecified chronic kidney disease: Secondary | ICD-10-CM | POA: Diagnosis present

## 2020-10-08 DIAGNOSIS — E785 Hyperlipidemia, unspecified: Secondary | ICD-10-CM | POA: Diagnosis present

## 2020-10-08 DIAGNOSIS — R0600 Dyspnea, unspecified: Secondary | ICD-10-CM | POA: Diagnosis not present

## 2020-10-08 DIAGNOSIS — F3181 Bipolar II disorder: Secondary | ICD-10-CM | POA: Diagnosis not present

## 2020-10-08 DIAGNOSIS — R404 Transient alteration of awareness: Secondary | ICD-10-CM | POA: Diagnosis not present

## 2020-10-08 DIAGNOSIS — Z66 Do not resuscitate: Secondary | ICD-10-CM | POA: Diagnosis present

## 2020-10-08 DIAGNOSIS — Z9981 Dependence on supplemental oxygen: Secondary | ICD-10-CM | POA: Diagnosis not present

## 2020-10-08 DIAGNOSIS — I509 Heart failure, unspecified: Secondary | ICD-10-CM | POA: Diagnosis not present

## 2020-10-08 DIAGNOSIS — E1122 Type 2 diabetes mellitus with diabetic chronic kidney disease: Secondary | ICD-10-CM | POA: Diagnosis present

## 2020-10-08 DIAGNOSIS — N189 Chronic kidney disease, unspecified: Secondary | ICD-10-CM | POA: Diagnosis present

## 2020-10-08 DIAGNOSIS — J180 Bronchopneumonia, unspecified organism: Secondary | ICD-10-CM | POA: Diagnosis not present

## 2020-10-08 DIAGNOSIS — Z79899 Other long term (current) drug therapy: Secondary | ICD-10-CM | POA: Diagnosis not present

## 2020-10-08 DIAGNOSIS — E119 Type 2 diabetes mellitus without complications: Secondary | ICD-10-CM | POA: Diagnosis not present

## 2020-10-08 DIAGNOSIS — J9622 Acute and chronic respiratory failure with hypercapnia: Secondary | ICD-10-CM | POA: Diagnosis not present

## 2020-10-09 NOTE — Progress Notes (Deleted)
[facility] MD/PA/NP OP Progress Note  10/09/2020 5:15 PM Brenda Mcdonald  MRN:  [id number]  Chief Complaint:  HPI:  - patient was admitted for hypercarbic respiratory failure  Visit Diagnosis: No diagnosis found.  Past Psychiatric History: ***  Past Medical History:  Past Medical History:  Diagnosis Date  . Anxiety   . Arthritis   . Bipolar disorder (HCC)   . COPD (chronic obstructive pulmonary disease) (HCC)   . Coronary atherosclerosis of native coronary artery    Nonobstructive  . Depression   . Diabetes mellitus, type 2 (HCC)   . Dysphagia   . Essential hypertension   . GERD (gastroesophageal reflux disease)   . Pneumonia   . Pulmonary nodule July 2009   Nonspecific 5 mm right middle lobe  . TIA (transient ischemic attack)     Past Surgical History:  Procedure Laterality Date  . ABDOMINAL HYSTERECTOMY    . APPENDECTOMY    . CARDIAC CATHETERIZATION  11/2010  . CATARACT EXTRACTION, BILATERAL Bilateral   . CHOLECYSTECTOMY    . DILATION AND CURETTAGE OF UTERUS    . INCISION AND DRAINAGE Left 03/12/2014   Procedure: LEFT INCISION/DRAINAGE DEEP ABSCESS/BURSA/HEMATOMA THIGH/KNEE REGION;  Surgeon: [name], MD;  Location: [facility] OR;  Service: Orthopedics;  Laterality: Left;  . KNEE ARTHROSCOPY Left   . ORIF DISTAL FEMUR FRACTURE Left 01/24/2014  . ORIF FEMUR FRACTURE Left 01/24/2014   Procedure: OPEN REDUCTION INTERNAL FIXATION (ORIF) DISTAL FEMUR FRACTURE;  Surgeon: [name], MD;  Location: [facility] OR;  Service: Orthopedics;  Laterality: Left;  . TIBIA FRACTURE SURGERY Left ~ 1959  . TOTAL KNEE ARTHROPLASTY Left 2000  . VESICOVAGINAL FISTULA CLOSURE W/ TAH      Family Psychiatric History: ***  Family History:  Family History  Problem Relation Age of Onset  . Coronary artery disease Other     Social History:  Social History   Socioeconomic History  . Marital status: Married    Spouse name: Not on file  . Number of children: Not on file  . Years of education: Not  on file  . Highest education level: Not on file  Occupational History  . Not on file  Tobacco Use  . Smoking status: Former Smoker    Packs/day: 0.50    Years: 58.00    Pack years: 29.00    Types: Cigarettes    Quit date: 07/26/2010    Years since quitting: 10.2  . Smokeless tobacco: Never Used  Substance and Sexual Activity  . Alcohol use: No    Alcohol/week: 0.0 standard drinks  . Drug use: No  . Sexual activity: Never  Other Topics Concern  . Not on file  Social History Narrative  . Not on file   Social Determinants of Health   Financial Resource Strain: Not on file  Food Insecurity: Not on file  Transportation Needs: Not on file  Physical Activity: Not on file  Stress: Not on file  Social Connections: Not on file    Allergies:  Allergies  Allergen Reactions  . Azithromycin     Per MAR  . Penicillins     Tolerated Rocephin (July 2015)  . Tetracycline     Per MAR    Metabolic Disorder Labs: No results found for: HGBA1C, MPG No results found for: PROLACTIN Lab Results  Component Value Date   CHOL  09/06/2008    164        ATP III CLASSIFICATION:  <200     mg/dL  Desirable  200-239  mg/dL   Borderline High  >=161    mg/dL   High          TRIG 86 09/06/2008   HDL 49 09/06/2008   CHOLHDL 3.3 09/06/2008   VLDL 17 09/06/2008   LDLCALC  09/06/2008    98        Total Cholesterol/HDL:CHD Risk Coronary Heart Disease Risk Table                     Men   Women  1/2 Average Risk   3.4   3.3  Average Risk       5.0   4.4  2 X Average Risk   9.6   7.1  3 X Average Risk  23.4   11.0        Use the calculated Patient Ratio above and the CHD Risk Table to determine the patient's CHD Risk.        ATP III CLASSIFICATION (LDL):  <100     mg/dL   Optimal  096-045  mg/dL   Near or Above                    Optimal  130-159  mg/dL   Borderline  409-811  mg/dL   High  >914     mg/dL   Very High   Lab Results  Component Value Date   TSH 0.71 01/09/2020     Therapeutic Level Labs: No results found for: LITHIUM No results found for: VALPROATE No components found for:  CBMZ  Current Medications: Current Outpatient Medications  Medication Sig Dispense Refill  . albuterol (PROVENTIL HFA;VENTOLIN HFA) 108 (90 BASE) MCG/ACT inhaler Inhale 2 puffs into the lungs every 6 (six) hours as needed for wheezing or shortness of breath.    Marland Kitchen amLODipine (NORVASC) 5 MG tablet Take 5 mg by mouth daily.    Marland Kitchen atorvastatin (LIPITOR) 10 MG tablet Take 1 tablet (10 mg total) by mouth at bedtime. 90 tablet 3  . busPIRone (BUSPAR) 10 MG tablet Take 1 tablet (10 mg total) by mouth 3 (three) times daily as needed (anxiety). 84 tablet 3  . escitalopram (LEXAPRO) 10 MG tablet Take 1 tablet (10 mg total) by mouth daily. 28 tablet 3  . furosemide (LASIX) 40 MG tablet Take 40 mg by mouth as needed.     Marland Kitchen glimepiride (AMARYL) 2 MG tablet Take 1 tablet by mouth 2 (two) times daily.    Marland Kitchen HYDROcodone-acetaminophen (NORCO/VICODIN) 5-325 MG per tablet Take 1 tablet by mouth every 4 (four) hours as needed for moderate pain. (Patient taking differently: Take 1 tablet by mouth 2 (two) times daily. ) 30 tablet 0  . ipratropium-albuterol (DUONEB) 0.5-2.5 (3) MG/3ML SOLN Take 3 mLs by nebulization 4 (four) times daily as needed. Shortness of breath    . JANUVIA 50 MG tablet Take 1 tablet by mouth daily.    Marland Kitchen lamoTRIgine (LAMICTAL) 100 MG tablet Take 1 tablet (100 mg total) by mouth at bedtime. 28 tablet 3  . memantine (NAMENDA XR) 14 MG CP24 24 hr capsule Take 14 mg by mouth daily.    . metoprolol tartrate (LOPRESSOR) 25 MG tablet Take 0.5 tablets (12.5 mg total) by mouth 2 (two) times daily. 90 tablet 3  . mirtazapine (REMERON) 30 MG tablet Take 1 tablet (30 mg total) by mouth at bedtime. 28 tablet 3  . VITAMIN D, CHOLECALCIFEROL, PO Take 1 tablet by mouth daily.  No current facility-administered medications for this visit.     Musculoskeletal: Strength & Muscle Tone: {desc;  muscle tone:32375} Gait & Station: {PE GAIT ED VXBL:39030} Patient leans: {Patient Leans:21022755}  Psychiatric Specialty Exam: Review of Systems  There were no vitals taken for this visit.There is no height or weight on file to calculate BMI.  General Appearance: {Appearance:22683}  Eye Contact:  {BHH EYE CONTACT:22684}  Speech:  {Speech:22685}  Volume:  {Volume (PAA):22686}  Mood:  {BHH MOOD:22306}  Affect:  {Affect (PAA):22687}  Thought Process:  {Thought Process (PAA):22688}  Orientation:  {BHH ORIENTATION (PAA):22689}  Thought Content: {Thought Content:22690}   Suicidal Thoughts:  {ST/HT (PAA):22692}  Homicidal Thoughts:  {ST/HT (PAA):22692}  Memory:  {BHH MEMORY:22881}  Judgement:  {Judgement (PAA):22694}  Insight:  {Insight (PAA):22695}  Psychomotor Activity:  {Psychomotor (PAA):22696}  Concentration:  {Concentration:21399}  Recall:  {BHH GOOD/FAIR/POOR:22877}  Fund of Knowledge: {BHH GOOD/FAIR/POOR:22877}  Language: {BHH GOOD/FAIR/POOR:22877}  Akathisia:  {BHH YES OR NO:22294}  Handed:  {Handed:22697}  AIMS (if indicated): {Desc; done/not:10129}  Assets:  {Assets (PAA):22698}  ADL's:  {BHH SPQ'Z:30076}  Cognition: {chl bhh cognition:304700322}  Sleep:  {BHH GOOD/FAIR/POOR:22877}   Screenings: PHQ2-9   Flowsheet Row Office Visit from 04/09/2014 in [facility] for Infectious Disease  PHQ-2 Total Score 0       Assessment and Plan: ***   Neysa Hotter, MD 10/09/2020, 5:15 PM

## 2020-10-15 ENCOUNTER — Telehealth: Payer: Self-pay | Admitting: Psychiatry

## 2020-10-15 ENCOUNTER — Telehealth: Payer: Medicare Other | Admitting: Psychiatry

## 2020-10-15 ENCOUNTER — Other Ambulatory Visit: Payer: Self-pay

## 2020-10-15 DIAGNOSIS — I130 Hypertensive heart and chronic kidney disease with heart failure and stage 1 through stage 4 chronic kidney disease, or unspecified chronic kidney disease: Secondary | ICD-10-CM | POA: Diagnosis not present

## 2020-10-15 NOTE — Telephone Encounter (Signed)
Brenda Mcdonald is on the video. [name] was admitted to the hospital, and will be discharged to home later today. Brenda Mcdonald agrees to reschedule her visit. She verbalized understanding to notify the clinic in advance in the future if the patient is unable to attend to the appointment.

## 2020-10-16 DIAGNOSIS — Z87891 Personal history of nicotine dependence: Secondary | ICD-10-CM | POA: Diagnosis not present

## 2020-10-16 DIAGNOSIS — E1140 Type 2 diabetes mellitus with diabetic neuropathy, unspecified: Secondary | ICD-10-CM | POA: Diagnosis not present

## 2020-10-16 DIAGNOSIS — E785 Hyperlipidemia, unspecified: Secondary | ICD-10-CM | POA: Diagnosis not present

## 2020-10-16 DIAGNOSIS — I2510 Atherosclerotic heart disease of native coronary artery without angina pectoris: Secondary | ICD-10-CM | POA: Diagnosis not present

## 2020-10-16 DIAGNOSIS — R1310 Dysphagia, unspecified: Secondary | ICD-10-CM | POA: Diagnosis not present

## 2020-10-16 DIAGNOSIS — I509 Heart failure, unspecified: Secondary | ICD-10-CM | POA: Diagnosis not present

## 2020-10-16 DIAGNOSIS — Z7984 Long term (current) use of oral hypoglycemic drugs: Secondary | ICD-10-CM | POA: Diagnosis not present

## 2020-10-16 DIAGNOSIS — M545 Low back pain, unspecified: Secondary | ICD-10-CM | POA: Diagnosis not present

## 2020-10-16 DIAGNOSIS — Z9981 Dependence on supplemental oxygen: Secondary | ICD-10-CM | POA: Diagnosis not present

## 2020-10-16 DIAGNOSIS — Z7951 Long term (current) use of inhaled steroids: Secondary | ICD-10-CM | POA: Diagnosis not present

## 2020-10-16 DIAGNOSIS — F3181 Bipolar II disorder: Secondary | ICD-10-CM | POA: Diagnosis not present

## 2020-10-16 DIAGNOSIS — J449 Chronic obstructive pulmonary disease, unspecified: Secondary | ICD-10-CM | POA: Diagnosis not present

## 2020-10-16 DIAGNOSIS — G8929 Other chronic pain: Secondary | ICD-10-CM | POA: Diagnosis not present

## 2020-10-16 DIAGNOSIS — J9622 Acute and chronic respiratory failure with hypercapnia: Secondary | ICD-10-CM | POA: Diagnosis not present

## 2020-10-16 DIAGNOSIS — I700 Atherosclerosis of aorta: Secondary | ICD-10-CM | POA: Diagnosis not present

## 2020-10-16 DIAGNOSIS — J9621 Acute and chronic respiratory failure with hypoxia: Secondary | ICD-10-CM | POA: Diagnosis not present

## 2020-10-16 DIAGNOSIS — M069 Rheumatoid arthritis, unspecified: Secondary | ICD-10-CM | POA: Diagnosis not present

## 2020-10-16 DIAGNOSIS — I110 Hypertensive heart disease with heart failure: Secondary | ICD-10-CM | POA: Diagnosis not present

## 2020-10-16 NOTE — Progress Notes (Signed)
Virtual Visit via Video Note  I connected with Brenda Mcdonald on 10/20/20 at  9:20 AM EDT by a video enabled telemedicine application and verified that I am speaking with the correct person using two identifiers.  Location: Patient: home Provider: office Persons participated in the visit- patient, provider, [name] (caregiver, niece)    I discussed the limitations of evaluation and management by telemedicine and the availability of in person appointments. The patient expressed understanding and agreed to proceed.     I discussed the assessment and treatment plan with the patient. The patient was provided an opportunity to ask questions and all were answered. The patient agreed with the plan and demonstrated an understanding of the instructions.   The patient was advised to call back or seek an in-person evaluation if the symptoms worsen or if the condition fails to improve as anticipated.  I provided 15 minutes of non-face-to-face time during this encounter.   Neysa Hotter, MD    [facility] MD/PA/NP OP Progress Note  10/20/2020 9:57 AM Brenda Mcdonald  MRN:  [id number]  Chief Complaint:  Chief Complaint    Follow-up; Depression     HPI:  This is a follow-up appointment for depression and anxiety.  She states that she feels "a whole lot better "since the last visit.  She was admitted 3 times due to shortness of breath.  Although her "nerves are bad ," she feels better, and wants to stay on her medication.  She spends time reading Bibles at times.  She wants to do in person visit.  She has been vaccinated .  She has depressive symptoms as in PHQ-9.  She denies SI.  She feels anxious at times.   Brenda Mcdonald, her niece presents to the interview.  Brenda Mcdonald has been doing well.  She has a concern of lowering medication as Brenda Mcdonald tends to get anxious when change in her medication.  She denies any other concern.   Employment: used to work at nursing home Support: [name], her niece Household:  husband Marital status: married twice Number of children: 4. 8 grandchildren Born and grew up in [place], she reports"bad childhood", her mother died at age 67  Visit Diagnosis:    ICD-10-CM   1. MDD (major depressive disorder), recurrent episode, mild (HCC)  F33.0   2. Anxiety state  F41.1     Past Psychiatric History: Please see initial evaluation for full details. I have reviewed the history. No updates at this time.     Past Medical History:  Past Medical History:  Diagnosis Date  . Anxiety   . Arthritis   . Bipolar disorder (HCC)   . COPD (chronic obstructive pulmonary disease) (HCC)   . Coronary atherosclerosis of native coronary artery    Nonobstructive  . Depression   . Diabetes mellitus, type 2 (HCC)   . Dysphagia   . Essential hypertension   . GERD (gastroesophageal reflux disease)   . Pneumonia   . Pulmonary nodule July 2009   Nonspecific 5 mm right middle lobe  . TIA (transient ischemic attack)     Past Surgical History:  Procedure Laterality Date  . ABDOMINAL HYSTERECTOMY    . APPENDECTOMY    . CARDIAC CATHETERIZATION  11/2010  . CATARACT EXTRACTION, BILATERAL Bilateral   . CHOLECYSTECTOMY    . DILATION AND CURETTAGE OF UTERUS    . INCISION AND DRAINAGE Left 03/12/2014   Procedure: LEFT INCISION/DRAINAGE DEEP ABSCESS/BURSA/HEMATOMA THIGH/KNEE REGION;  Surgeon: [name], MD;  Location: [facility] OR;  Service: Orthopedics;  Laterality: Left;  . KNEE ARTHROSCOPY Left   . ORIF DISTAL FEMUR FRACTURE Left 01/24/2014  . ORIF FEMUR FRACTURE Left 01/24/2014   Procedure: OPEN REDUCTION INTERNAL FIXATION (ORIF) DISTAL FEMUR FRACTURE;  Surgeon: [name], MD;  Location: [facility] OR;  Service: Orthopedics;  Laterality: Left;  . TIBIA FRACTURE SURGERY Left ~ 1959  . TOTAL KNEE ARTHROPLASTY Left 2000  . VESICOVAGINAL FISTULA CLOSURE W/ TAH      Family Psychiatric History: Please see initial evaluation for full details. I have reviewed the history. No updates at this  time.     Family History:  Family History  Problem Relation Age of Onset  . Coronary artery disease Other     Social History:  Social History   Socioeconomic History  . Marital status: Married    Spouse name: Not on file  . Number of children: Not on file  . Years of education: Not on file  . Highest education level: Not on file  Occupational History  . Not on file  Tobacco Use  . Smoking status: Former Smoker    Packs/day: 0.50    Years: 58.00    Pack years: 29.00    Types: Cigarettes    Quit date: 07/26/2010    Years since quitting: 10.2  . Smokeless tobacco: Never Used  Substance and Sexual Activity  . Alcohol use: No    Alcohol/week: 0.0 standard drinks  . Drug use: No  . Sexual activity: Never  Other Topics Concern  . Not on file  Social History Narrative  . Not on file   Social Determinants of Health   Financial Resource Strain: Not on file  Food Insecurity: Not on file  Transportation Needs: Not on file  Physical Activity: Not on file  Stress: Not on file  Social Connections: Not on file    Allergies:  Allergies  Allergen Reactions  . Azithromycin     Per MAR  . Penicillins     Tolerated Rocephin (July 2015)  . Tetracycline     Per MAR    Metabolic Disorder Labs: No results found for: HGBA1C, MPG No results found for: PROLACTIN Lab Results  Component Value Date   CHOL  09/06/2008    164        ATP III CLASSIFICATION:  <200     mg/dL   Desirable  175-102  mg/dL   Borderline High  >=585    mg/dL   High          TRIG 86 09/06/2008   HDL 49 09/06/2008   CHOLHDL 3.3 09/06/2008   VLDL 17 09/06/2008   LDLCALC  09/06/2008    98        Total Cholesterol/HDL:CHD Risk Coronary Heart Disease Risk Table                     Men   Women  1/2 Average Risk   3.4   3.3  Average Risk       5.0   4.4  2 X Average Risk   9.6   7.1  3 X Average Risk  23.4   11.0        Use the calculated Patient Ratio above and the CHD Risk Table to determine  the patient's CHD Risk.        ATP III CLASSIFICATION (LDL):  <100     mg/dL   Optimal  277-824  mg/dL   Near or Above  Optimal  130-159  mg/dL   Borderline  161-096  mg/dL   High  >045     mg/dL   Very High   Lab Results  Component Value Date   TSH 0.71 01/09/2020    Therapeutic Level Labs: No results found for: LITHIUM No results found for: VALPROATE No components found for:  CBMZ  Current Medications: Current Outpatient Medications  Medication Sig Dispense Refill  . albuterol (PROVENTIL HFA;VENTOLIN HFA) 108 (90 BASE) MCG/ACT inhaler Inhale 2 puffs into the lungs every 6 (six) hours as needed for wheezing or shortness of breath.    Marland Kitchen amLODipine (NORVASC) 5 MG tablet Take 5 mg by mouth daily.    Marland Kitchen atorvastatin (LIPITOR) 10 MG tablet Take 1 tablet (10 mg total) by mouth at bedtime. 90 tablet 3  . [START ON 11/15/2020] busPIRone (BUSPAR) 10 MG tablet Take 1 tablet (10 mg total) by mouth 3 (three) times daily as needed (anxiety). 84 tablet 2  . [START ON 11/15/2020] escitalopram (LEXAPRO) 10 MG tablet Take 1 tablet (10 mg total) by mouth daily. 28 tablet 2  . furosemide (LASIX) 40 MG tablet Take 40 mg by mouth as needed.     Marland Kitchen glimepiride (AMARYL) 2 MG tablet Take 1 tablet by mouth 2 (two) times daily.    Marland Kitchen HYDROcodone-acetaminophen (NORCO/VICODIN) 5-325 MG per tablet Take 1 tablet by mouth every 4 (four) hours as needed for moderate pain. (Patient taking differently: Take 1 tablet by mouth 2 (two) times daily. ) 30 tablet 0  . ipratropium-albuterol (DUONEB) 0.5-2.5 (3) MG/3ML SOLN Take 3 mLs by nebulization 4 (four) times daily as needed. Shortness of breath    . JANUVIA 50 MG tablet Take 1 tablet by mouth daily.    Melene Muller ON 11/15/2020] lamoTRIgine (LAMICTAL) 100 MG tablet Take 1 tablet (100 mg total) by mouth at bedtime. 28 tablet 2  . memantine (NAMENDA XR) 14 MG CP24 24 hr capsule Take 14 mg by mouth daily.    . metoprolol tartrate (LOPRESSOR) 25 MG tablet Take  0.5 tablets (12.5 mg total) by mouth 2 (two) times daily. 90 tablet 3  . [START ON 11/15/2020] mirtazapine (REMERON) 30 MG tablet Take 1 tablet (30 mg total) by mouth at bedtime. 28 tablet 2  . VITAMIN D, CHOLECALCIFEROL, PO Take 1 tablet by mouth daily.     No current facility-administered medications for this visit.     Musculoskeletal: Strength & Muscle Tone: N/A Gait & Station: N/A Patient leans: N/A  Psychiatric Specialty Exam: Review of Systems  Psychiatric/Behavioral: Positive for dysphoric mood. Negative for agitation, behavioral problems, confusion, decreased concentration, hallucinations, self-injury, sleep disturbance and suicidal ideas. The patient is nervous/anxious. The patient is not hyperactive.   All other systems reviewed and are negative.   There were no vitals taken for this visit.There is no height or weight on file to calculate BMI.  General Appearance: Fairly Groomed  Eye Contact:  Good  Speech:  Clear and Coherent  Volume:  Normal  Mood:  better  Affect:  Appropriate, Congruent and slightly restricted  Thought Process:  Coherent  Orientation:  Full (Time, Place, and Person)  Thought Content: Logical   Suicidal Thoughts:  No  Homicidal Thoughts:  No  Memory:  Immediate;   Good  Judgement:  Good  Insight:  Fair  Psychomotor Activity:  Normal  Concentration:  Concentration: Good and Attention Span: Good  Recall:  Good  Fund of Knowledge: Good  Language: Good  Akathisia:  No  Handed:  Right  AIMS (if indicated): not done  Assets:  Communication Skills Desire for Improvement  ADL's:  Intact  Cognition: WNL  Sleep:  Good   Screenings: PHQ2-9   Flowsheet Row Video Visit from 10/20/2020 in [facility] Office Visit from 04/09/2014 in [facility]  PHQ-2 Total Score 2 0  PHQ-9 Total Score 4 --    Flowsheet Row Video Visit [facility] 10/20/2020 in [facility]  C-SSRS  RISK CATEGORY No Risk       Assessment and Plan:  DONALD JACQUE is a 79 y.o. year old female with a history of  bipolar II disorder, major neurocognitive disorder, hypertension,CAD,COPD, TIA, type II DM , who presents for follow up appointment for below.   1. MDD (major depressive disorder), recurrent episode, mild (HCC) 2. Anxiety state There has been overall improvement in depressive symptoms and anxiety since the last visit.  Will continue current medication regimen.  Noted that although it has been repeatedly discussed about the concern of polypharmacy, both the patient and her niece has strong preference to stay on the current medication with a concern of relapse in her symptoms.  Will continue Lexapro to target depression.  We will continue mirtazapine as adjunctive treatment for depression.  Will continue lamotrigine for mood dysregulation.  We will continue quetiapine for mood dysregulation/adjunctive treatment for depression.  Will continue BuSpar for anxiety.  Side effects from medication have been discussed and reviewed; please see details in the previous encounters.   #Major neurocognitive disorder She does have acognitive deficits as characterized by MOCA. She is on memantine, prescribed by PCP. Will continue to monitor.  Plan  I have reviewed and updated plans as below 1. Continue lexapro 10 mg daily 2.Continuemirtazapine 30mg  at night 3.Continuelamotrigine100mg  daily 4.Continuequetiapine 50mg  night 5. ContinueBuspar10 mg three times a day as needed for anxiety  6.Next appointment-6/27 at 3 PM for 30 mins, in person, phone [phone] ) 7.(She is on memantine 14 mg daily)   The patient demonstrates the following risk factors for suicide: Chronic risk factors for suicide include: psychiatric disorder of bipolar disorder. Acute risk factorsfor suicide include: unemployment and social withdrawal/isolation. Protective factorsfor this patient  include: positive social support, coping skills. Considering these factors, the overall suicide risk at this point appears to be low. Patient isappropriate for outpatient follow up.  [phone], MD 10/20/2020, 9:57 AM

## 2020-10-17 DIAGNOSIS — I110 Hypertensive heart disease with heart failure: Secondary | ICD-10-CM | POA: Diagnosis not present

## 2020-10-17 DIAGNOSIS — I509 Heart failure, unspecified: Secondary | ICD-10-CM | POA: Diagnosis not present

## 2020-10-17 DIAGNOSIS — I2510 Atherosclerotic heart disease of native coronary artery without angina pectoris: Secondary | ICD-10-CM | POA: Diagnosis not present

## 2020-10-17 DIAGNOSIS — J9621 Acute and chronic respiratory failure with hypoxia: Secondary | ICD-10-CM | POA: Diagnosis not present

## 2020-10-17 DIAGNOSIS — J9622 Acute and chronic respiratory failure with hypercapnia: Secondary | ICD-10-CM | POA: Diagnosis not present

## 2020-10-17 DIAGNOSIS — J449 Chronic obstructive pulmonary disease, unspecified: Secondary | ICD-10-CM | POA: Diagnosis not present

## 2020-10-19 DIAGNOSIS — J9621 Acute and chronic respiratory failure with hypoxia: Secondary | ICD-10-CM | POA: Diagnosis not present

## 2020-10-19 DIAGNOSIS — J9622 Acute and chronic respiratory failure with hypercapnia: Secondary | ICD-10-CM | POA: Diagnosis not present

## 2020-10-19 DIAGNOSIS — I2510 Atherosclerotic heart disease of native coronary artery without angina pectoris: Secondary | ICD-10-CM | POA: Diagnosis not present

## 2020-10-19 DIAGNOSIS — I509 Heart failure, unspecified: Secondary | ICD-10-CM | POA: Diagnosis not present

## 2020-10-19 DIAGNOSIS — J449 Chronic obstructive pulmonary disease, unspecified: Secondary | ICD-10-CM | POA: Diagnosis not present

## 2020-10-19 DIAGNOSIS — I110 Hypertensive heart disease with heart failure: Secondary | ICD-10-CM | POA: Diagnosis not present

## 2020-10-20 ENCOUNTER — Other Ambulatory Visit: Payer: Self-pay

## 2020-10-20 ENCOUNTER — Telehealth (INDEPENDENT_AMBULATORY_CARE_PROVIDER_SITE_OTHER): Payer: Medicare Other | Admitting: Psychiatry

## 2020-10-20 ENCOUNTER — Encounter: Payer: Self-pay | Admitting: Psychiatry

## 2020-10-20 DIAGNOSIS — I2510 Atherosclerotic heart disease of native coronary artery without angina pectoris: Secondary | ICD-10-CM | POA: Diagnosis not present

## 2020-10-20 DIAGNOSIS — Z299 Encounter for prophylactic measures, unspecified: Secondary | ICD-10-CM | POA: Diagnosis not present

## 2020-10-20 DIAGNOSIS — E785 Hyperlipidemia, unspecified: Secondary | ICD-10-CM | POA: Diagnosis not present

## 2020-10-20 DIAGNOSIS — F319 Bipolar disorder, unspecified: Secondary | ICD-10-CM | POA: Diagnosis not present

## 2020-10-20 DIAGNOSIS — I110 Hypertensive heart disease with heart failure: Secondary | ICD-10-CM | POA: Diagnosis not present

## 2020-10-20 DIAGNOSIS — J9622 Acute and chronic respiratory failure with hypercapnia: Secondary | ICD-10-CM | POA: Diagnosis not present

## 2020-10-20 DIAGNOSIS — J9621 Acute and chronic respiratory failure with hypoxia: Secondary | ICD-10-CM | POA: Diagnosis not present

## 2020-10-20 DIAGNOSIS — F411 Generalized anxiety disorder: Secondary | ICD-10-CM

## 2020-10-20 DIAGNOSIS — J449 Chronic obstructive pulmonary disease, unspecified: Secondary | ICD-10-CM | POA: Diagnosis not present

## 2020-10-20 DIAGNOSIS — I509 Heart failure, unspecified: Secondary | ICD-10-CM | POA: Diagnosis not present

## 2020-10-20 DIAGNOSIS — J9611 Chronic respiratory failure with hypoxia: Secondary | ICD-10-CM | POA: Diagnosis not present

## 2020-10-20 DIAGNOSIS — F330 Major depressive disorder, recurrent, mild: Secondary | ICD-10-CM

## 2020-10-20 MED ORDER — BUSPIRONE HCL 10 MG PO TABS: 10.0000 mg | ORAL_TABLET | Freq: Three times a day (TID) | ORAL | 2 refills | Status: AC | PRN

## 2020-10-20 MED ORDER — LAMOTRIGINE 100 MG PO TABS: 100.0000 mg | ORAL_TABLET | Freq: Every day | ORAL | 2 refills | Status: AC

## 2020-10-20 MED ORDER — ESCITALOPRAM OXALATE 10 MG PO TABS: 10.0000 mg | ORAL_TABLET | Freq: Every day | ORAL | 2 refills | Status: AC

## 2020-10-20 MED ORDER — MIRTAZAPINE 30 MG PO TABS
30.0000 mg | ORAL_TABLET | Freq: Every day | ORAL | 2 refills | Status: AC
Start: 2020-11-15 — End: ?

## 2020-10-20 NOTE — Patient Instructions (Signed)
1. Continue lexapro 10 mg daily 2.Continuemirtazapine 30mg  at night 3.Continuelamotrigine100mg  daily 4.Continuequetiapine 50mg  night 5. ContinueBuspar10 mg three times a day as needed for anxiety  6.Next appointment-6/27 at 3 PM

## 2020-10-22 DIAGNOSIS — J9622 Acute and chronic respiratory failure with hypercapnia: Secondary | ICD-10-CM | POA: Diagnosis not present

## 2020-10-22 DIAGNOSIS — I2510 Atherosclerotic heart disease of native coronary artery without angina pectoris: Secondary | ICD-10-CM | POA: Diagnosis not present

## 2020-10-22 DIAGNOSIS — I110 Hypertensive heart disease with heart failure: Secondary | ICD-10-CM | POA: Diagnosis not present

## 2020-10-22 DIAGNOSIS — I10 Essential (primary) hypertension: Secondary | ICD-10-CM | POA: Diagnosis not present

## 2020-10-22 DIAGNOSIS — I509 Heart failure, unspecified: Secondary | ICD-10-CM | POA: Diagnosis not present

## 2020-10-22 DIAGNOSIS — J449 Chronic obstructive pulmonary disease, unspecified: Secondary | ICD-10-CM | POA: Diagnosis not present

## 2020-10-22 DIAGNOSIS — F32A Depression, unspecified: Secondary | ICD-10-CM | POA: Diagnosis not present

## 2020-10-22 DIAGNOSIS — J9621 Acute and chronic respiratory failure with hypoxia: Secondary | ICD-10-CM | POA: Diagnosis not present

## 2020-10-24 DIAGNOSIS — J9602 Acute respiratory failure with hypercapnia: Secondary | ICD-10-CM | POA: Diagnosis not present

## 2020-10-24 DIAGNOSIS — S2231XA Fracture of one rib, right side, initial encounter for closed fracture: Secondary | ICD-10-CM | POA: Diagnosis not present

## 2020-10-24 DIAGNOSIS — F419 Anxiety disorder, unspecified: Secondary | ICD-10-CM | POA: Diagnosis not present

## 2020-10-24 DIAGNOSIS — R0902 Hypoxemia: Secondary | ICD-10-CM | POA: Diagnosis not present

## 2020-10-24 DIAGNOSIS — J9622 Acute and chronic respiratory failure with hypercapnia: Secondary | ICD-10-CM | POA: Diagnosis not present

## 2020-10-24 DIAGNOSIS — J9601 Acute respiratory failure with hypoxia: Secondary | ICD-10-CM | POA: Diagnosis not present

## 2020-10-24 DIAGNOSIS — N189 Chronic kidney disease, unspecified: Secondary | ICD-10-CM | POA: Diagnosis not present

## 2020-10-24 DIAGNOSIS — I10 Essential (primary) hypertension: Secondary | ICD-10-CM | POA: Diagnosis not present

## 2020-10-24 DIAGNOSIS — Z7984 Long term (current) use of oral hypoglycemic drugs: Secondary | ICD-10-CM | POA: Diagnosis not present

## 2020-10-24 DIAGNOSIS — I959 Hypotension, unspecified: Secondary | ICD-10-CM | POA: Diagnosis not present

## 2020-10-24 DIAGNOSIS — Z7952 Long term (current) use of systemic steroids: Secondary | ICD-10-CM | POA: Diagnosis not present

## 2020-10-24 DIAGNOSIS — I130 Hypertensive heart and chronic kidney disease with heart failure and stage 1 through stage 4 chronic kidney disease, or unspecified chronic kidney disease: Secondary | ICD-10-CM | POA: Diagnosis present

## 2020-10-24 DIAGNOSIS — E1100 Type 2 diabetes mellitus with hyperosmolarity without nonketotic hyperglycemic-hyperosmolar coma (NKHHC): Secondary | ICD-10-CM | POA: Diagnosis not present

## 2020-10-24 DIAGNOSIS — Z792 Long term (current) use of antibiotics: Secondary | ICD-10-CM | POA: Diagnosis not present

## 2020-10-24 DIAGNOSIS — Z9981 Dependence on supplemental oxygen: Secondary | ICD-10-CM | POA: Diagnosis not present

## 2020-10-24 DIAGNOSIS — Z87891 Personal history of nicotine dependence: Secondary | ICD-10-CM | POA: Diagnosis not present

## 2020-10-24 DIAGNOSIS — J811 Chronic pulmonary edema: Secondary | ICD-10-CM | POA: Diagnosis not present

## 2020-10-24 DIAGNOSIS — E1122 Type 2 diabetes mellitus with diabetic chronic kidney disease: Secondary | ICD-10-CM | POA: Diagnosis not present

## 2020-10-24 DIAGNOSIS — J80 Acute respiratory distress syndrome: Secondary | ICD-10-CM | POA: Diagnosis not present

## 2020-10-24 DIAGNOSIS — I700 Atherosclerosis of aorta: Secondary | ICD-10-CM | POA: Diagnosis present

## 2020-10-24 DIAGNOSIS — E785 Hyperlipidemia, unspecified: Secondary | ICD-10-CM | POA: Diagnosis present

## 2020-10-24 DIAGNOSIS — Z880 Allergy status to penicillin: Secondary | ICD-10-CM | POA: Diagnosis not present

## 2020-10-24 DIAGNOSIS — R0600 Dyspnea, unspecified: Secondary | ICD-10-CM | POA: Diagnosis not present

## 2020-10-24 DIAGNOSIS — R404 Transient alteration of awareness: Secondary | ICD-10-CM | POA: Diagnosis not present

## 2020-10-24 DIAGNOSIS — I509 Heart failure, unspecified: Secondary | ICD-10-CM | POA: Diagnosis not present

## 2020-10-24 DIAGNOSIS — F3110 Bipolar disorder, current episode manic without psychotic features, unspecified: Secondary | ICD-10-CM | POA: Diagnosis not present

## 2020-10-24 DIAGNOSIS — Z20822 Contact with and (suspected) exposure to covid-19: Secondary | ICD-10-CM | POA: Diagnosis not present

## 2020-10-24 DIAGNOSIS — Z515 Encounter for palliative care: Secondary | ICD-10-CM | POA: Diagnosis not present

## 2020-10-24 DIAGNOSIS — Z7401 Bed confinement status: Secondary | ICD-10-CM | POA: Diagnosis not present

## 2020-10-24 DIAGNOSIS — F3181 Bipolar II disorder: Secondary | ICD-10-CM | POA: Diagnosis present

## 2020-10-24 DIAGNOSIS — J441 Chronic obstructive pulmonary disease with (acute) exacerbation: Secondary | ICD-10-CM | POA: Diagnosis not present

## 2020-10-24 DIAGNOSIS — N183 Chronic kidney disease, stage 3 unspecified: Secondary | ICD-10-CM | POA: Diagnosis present

## 2020-10-24 DIAGNOSIS — E119 Type 2 diabetes mellitus without complications: Secondary | ICD-10-CM | POA: Diagnosis not present

## 2020-10-24 DIAGNOSIS — I5033 Acute on chronic diastolic (congestive) heart failure: Secondary | ICD-10-CM | POA: Diagnosis not present

## 2020-10-24 DIAGNOSIS — J9690 Respiratory failure, unspecified, unspecified whether with hypoxia or hypercapnia: Secondary | ICD-10-CM | POA: Diagnosis not present

## 2020-10-24 DIAGNOSIS — Z79899 Other long term (current) drug therapy: Secondary | ICD-10-CM | POA: Diagnosis not present

## 2020-10-24 DIAGNOSIS — I129 Hypertensive chronic kidney disease with stage 1 through stage 4 chronic kidney disease, or unspecified chronic kidney disease: Secondary | ICD-10-CM | POA: Diagnosis not present

## 2020-10-24 DIAGNOSIS — Z66 Do not resuscitate: Secondary | ICD-10-CM | POA: Diagnosis present

## 2020-10-24 DIAGNOSIS — R0602 Shortness of breath: Secondary | ICD-10-CM | POA: Diagnosis not present

## 2020-10-24 DIAGNOSIS — R069 Unspecified abnormalities of breathing: Secondary | ICD-10-CM | POA: Diagnosis not present

## 2020-10-24 DIAGNOSIS — J449 Chronic obstructive pulmonary disease, unspecified: Secondary | ICD-10-CM | POA: Diagnosis not present

## 2020-10-24 DIAGNOSIS — I2510 Atherosclerotic heart disease of native coronary artery without angina pectoris: Secondary | ICD-10-CM | POA: Diagnosis present

## 2020-10-24 DIAGNOSIS — J189 Pneumonia, unspecified organism: Secondary | ICD-10-CM | POA: Diagnosis not present

## 2020-11-05 DIAGNOSIS — I10 Essential (primary) hypertension: Secondary | ICD-10-CM | POA: Diagnosis not present

## 2020-11-05 DIAGNOSIS — J9602 Acute respiratory failure with hypercapnia: Secondary | ICD-10-CM | POA: Diagnosis not present

## 2020-11-05 DIAGNOSIS — J441 Chronic obstructive pulmonary disease with (acute) exacerbation: Secondary | ICD-10-CM | POA: Diagnosis not present

## 2020-11-05 DIAGNOSIS — I5033 Acute on chronic diastolic (congestive) heart failure: Secondary | ICD-10-CM | POA: Diagnosis not present

## 2020-11-18 DIAGNOSIS — R0600 Dyspnea, unspecified: Secondary | ICD-10-CM | POA: Diagnosis not present

## 2020-11-18 DIAGNOSIS — Z87891 Personal history of nicotine dependence: Secondary | ICD-10-CM | POA: Diagnosis not present

## 2020-11-18 DIAGNOSIS — J189 Pneumonia, unspecified organism: Secondary | ICD-10-CM | POA: Diagnosis not present

## 2020-11-18 DIAGNOSIS — Z20822 Contact with and (suspected) exposure to covid-19: Secondary | ICD-10-CM | POA: Diagnosis not present

## 2020-11-18 DIAGNOSIS — S2231XA Fracture of one rib, right side, initial encounter for closed fracture: Secondary | ICD-10-CM | POA: Diagnosis not present

## 2020-11-18 DIAGNOSIS — I10 Essential (primary) hypertension: Secondary | ICD-10-CM | POA: Diagnosis not present

## 2020-11-18 DIAGNOSIS — J9620 Acute and chronic respiratory failure, unspecified whether with hypoxia or hypercapnia: Secondary | ICD-10-CM | POA: Diagnosis not present

## 2020-11-18 DIAGNOSIS — J90 Pleural effusion, not elsewhere classified: Secondary | ICD-10-CM | POA: Diagnosis not present

## 2020-11-18 DIAGNOSIS — J811 Chronic pulmonary edema: Secondary | ICD-10-CM | POA: Diagnosis not present

## 2020-11-18 DIAGNOSIS — A419 Sepsis, unspecified organism: Secondary | ICD-10-CM | POA: Diagnosis not present

## 2020-11-19 DIAGNOSIS — J189 Pneumonia, unspecified organism: Secondary | ICD-10-CM | POA: Diagnosis not present

## 2020-11-19 DIAGNOSIS — I10 Essential (primary) hypertension: Secondary | ICD-10-CM | POA: Diagnosis not present

## 2020-11-19 DIAGNOSIS — J9620 Acute and chronic respiratory failure, unspecified whether with hypoxia or hypercapnia: Secondary | ICD-10-CM | POA: Diagnosis not present

## 2020-11-19 DIAGNOSIS — A419 Sepsis, unspecified organism: Secondary | ICD-10-CM | POA: Diagnosis not present

## 2020-11-19 DIAGNOSIS — J9621 Acute and chronic respiratory failure with hypoxia: Secondary | ICD-10-CM | POA: Diagnosis not present

## 2020-11-20 DIAGNOSIS — J189 Pneumonia, unspecified organism: Secondary | ICD-10-CM | POA: Diagnosis not present

## 2020-11-20 DIAGNOSIS — A419 Sepsis, unspecified organism: Secondary | ICD-10-CM | POA: Diagnosis not present

## 2020-11-20 DIAGNOSIS — I10 Essential (primary) hypertension: Secondary | ICD-10-CM | POA: Diagnosis not present

## 2020-11-20 DIAGNOSIS — J9620 Acute and chronic respiratory failure, unspecified whether with hypoxia or hypercapnia: Secondary | ICD-10-CM | POA: Diagnosis not present

## 2020-11-21 DIAGNOSIS — I10 Essential (primary) hypertension: Secondary | ICD-10-CM | POA: Diagnosis not present

## 2020-11-21 DIAGNOSIS — J9620 Acute and chronic respiratory failure, unspecified whether with hypoxia or hypercapnia: Secondary | ICD-10-CM | POA: Diagnosis not present

## 2020-11-21 DIAGNOSIS — A419 Sepsis, unspecified organism: Secondary | ICD-10-CM | POA: Diagnosis not present

## 2020-11-21 DIAGNOSIS — J189 Pneumonia, unspecified organism: Secondary | ICD-10-CM | POA: Diagnosis not present

## 2020-11-22 DIAGNOSIS — D631 Anemia in chronic kidney disease: Secondary | ICD-10-CM | POA: Diagnosis not present

## 2020-11-22 DIAGNOSIS — J9621 Acute and chronic respiratory failure with hypoxia: Secondary | ICD-10-CM | POA: Diagnosis not present

## 2020-11-22 DIAGNOSIS — J9622 Acute and chronic respiratory failure with hypercapnia: Secondary | ICD-10-CM | POA: Diagnosis not present

## 2020-11-22 DIAGNOSIS — I129 Hypertensive chronic kidney disease with stage 1 through stage 4 chronic kidney disease, or unspecified chronic kidney disease: Secondary | ICD-10-CM | POA: Diagnosis not present

## 2020-11-22 DIAGNOSIS — N189 Chronic kidney disease, unspecified: Secondary | ICD-10-CM | POA: Diagnosis not present

## 2020-11-22 DIAGNOSIS — Z79899 Other long term (current) drug therapy: Secondary | ICD-10-CM | POA: Diagnosis not present

## 2020-11-22 DIAGNOSIS — F419 Anxiety disorder, unspecified: Secondary | ICD-10-CM | POA: Diagnosis not present

## 2020-11-22 DIAGNOSIS — I2510 Atherosclerotic heart disease of native coronary artery without angina pectoris: Secondary | ICD-10-CM | POA: Diagnosis not present

## 2020-11-23 DIAGNOSIS — N189 Chronic kidney disease, unspecified: Secondary | ICD-10-CM | POA: Diagnosis not present

## 2020-11-23 DIAGNOSIS — J9621 Acute and chronic respiratory failure with hypoxia: Secondary | ICD-10-CM | POA: Diagnosis not present

## 2020-11-23 DIAGNOSIS — I2510 Atherosclerotic heart disease of native coronary artery without angina pectoris: Secondary | ICD-10-CM | POA: Diagnosis not present

## 2020-11-23 DIAGNOSIS — J9622 Acute and chronic respiratory failure with hypercapnia: Secondary | ICD-10-CM | POA: Diagnosis not present

## 2020-11-23 DIAGNOSIS — I129 Hypertensive chronic kidney disease with stage 1 through stage 4 chronic kidney disease, or unspecified chronic kidney disease: Secondary | ICD-10-CM | POA: Diagnosis not present

## 2020-11-23 DIAGNOSIS — D631 Anemia in chronic kidney disease: Secondary | ICD-10-CM | POA: Diagnosis not present

## 2020-11-24 DIAGNOSIS — J189 Pneumonia, unspecified organism: Secondary | ICD-10-CM | POA: Diagnosis not present

## 2020-11-24 DIAGNOSIS — J9620 Acute and chronic respiratory failure, unspecified whether with hypoxia or hypercapnia: Secondary | ICD-10-CM | POA: Diagnosis not present

## 2020-11-24 DIAGNOSIS — I10 Essential (primary) hypertension: Secondary | ICD-10-CM | POA: Diagnosis not present

## 2020-11-24 DIAGNOSIS — A419 Sepsis, unspecified organism: Secondary | ICD-10-CM | POA: Diagnosis not present

## 2020-12-24 DEATH — deceased

## 2021-01-19 ENCOUNTER — Ambulatory Visit: Payer: Medicare Other | Admitting: Psychiatry

## 2023-01-05 ENCOUNTER — Telehealth: Payer: Self-pay | Admitting: Obstetrics & Gynecology
